# Patient Record
Sex: Female | Born: 1960
Health system: Southern US, Community
[De-identification: ages and names within clinical notes are randomized; demographics above are authoritative.]

## PROBLEM LIST (undated history)

## (undated) DIAGNOSIS — R7309 Other abnormal glucose: Secondary | ICD-10-CM

## (undated) DIAGNOSIS — E669 Obesity, unspecified: Secondary | ICD-10-CM

## (undated) DIAGNOSIS — Z86010 Personal history of colonic polyps: Secondary | ICD-10-CM

## (undated) DIAGNOSIS — R232 Flushing: Secondary | ICD-10-CM

## (undated) DIAGNOSIS — G709 Myoneural disorder, unspecified: Secondary | ICD-10-CM

## (undated) DIAGNOSIS — Z923 Personal history of irradiation: Secondary | ICD-10-CM

## (undated) DIAGNOSIS — C50919 Malignant neoplasm of unspecified site of unspecified female breast: Secondary | ICD-10-CM

## (undated) DIAGNOSIS — F419 Anxiety disorder, unspecified: Secondary | ICD-10-CM

## (undated) HISTORY — DX: Flushing: R23.2

## (undated) HISTORY — DX: Anxiety disorder, unspecified: F41.9

## (undated) HISTORY — DX: Personal history of colonic polyps: Z86.010

## (undated) HISTORY — DX: Malignant neoplasm of unspecified site of unspecified female breast: C50.919

## (undated) HISTORY — DX: Obesity, unspecified: E66.9

## (undated) HISTORY — DX: Other abnormal glucose: R73.09

## (undated) HISTORY — PX: BREAST SURGERY: SHX581

---

## 2003-05-08 ENCOUNTER — Other Ambulatory Visit: Admission: RE | Admit: 2003-05-08 | Discharge: 2003-05-08 | Payer: Self-pay | Admitting: *Deleted

## 2003-12-12 ENCOUNTER — Inpatient Hospital Stay (HOSPITAL_COMMUNITY): Admission: AD | Admit: 2003-12-12 | Discharge: 2003-12-16 | Payer: Self-pay | Admitting: Obstetrics & Gynecology

## 2004-01-16 ENCOUNTER — Other Ambulatory Visit: Admission: RE | Admit: 2004-01-16 | Discharge: 2004-01-16 | Payer: Self-pay | Admitting: Obstetrics & Gynecology

## 2004-04-29 ENCOUNTER — Ambulatory Visit (HOSPITAL_COMMUNITY): Admission: AD | Admit: 2004-04-29 | Discharge: 2004-04-29 | Payer: Self-pay | Admitting: Obstetrics and Gynecology

## 2012-01-27 DIAGNOSIS — C50919 Malignant neoplasm of unspecified site of unspecified female breast: Secondary | ICD-10-CM

## 2012-01-27 HISTORY — DX: Malignant neoplasm of unspecified site of unspecified female breast: C50.919

## 2012-05-02 ENCOUNTER — Telehealth: Payer: Self-pay | Admitting: *Deleted

## 2012-05-02 ENCOUNTER — Encounter: Payer: Self-pay | Admitting: Obstetrics and Gynecology

## 2012-05-02 ENCOUNTER — Ambulatory Visit (INDEPENDENT_AMBULATORY_CARE_PROVIDER_SITE_OTHER): Payer: BC Managed Care – PPO | Admitting: Obstetrics and Gynecology

## 2012-05-02 ENCOUNTER — Telehealth: Payer: Self-pay | Admitting: Obstetrics and Gynecology

## 2012-05-02 VITALS — BP 124/84 | Ht 63.0 in | Wt 218.0 lb

## 2012-05-02 DIAGNOSIS — E669 Obesity, unspecified: Secondary | ICD-10-CM | POA: Insufficient documentation

## 2012-05-02 DIAGNOSIS — Z01419 Encounter for gynecological examination (general) (routine) without abnormal findings: Secondary | ICD-10-CM

## 2012-05-02 DIAGNOSIS — Z0000 Encounter for general adult medical examination without abnormal findings: Secondary | ICD-10-CM

## 2012-05-02 DIAGNOSIS — N63 Unspecified lump in unspecified breast: Secondary | ICD-10-CM

## 2012-05-02 DIAGNOSIS — Z23 Encounter for immunization: Secondary | ICD-10-CM

## 2012-05-02 DIAGNOSIS — N6310 Unspecified lump in the right breast, unspecified quadrant: Secondary | ICD-10-CM

## 2012-05-02 LAB — POCT URINALYSIS DIPSTICK
Bilirubin, UA: NEGATIVE
Blood, UA: NEGATIVE
Glucose, UA: NEGATIVE
Ketones, UA: NEGATIVE

## 2012-05-02 MED ORDER — ALPRAZOLAM 0.25 MG PO TABS: 0.2500 mg | ORAL_TABLET | Freq: Three times a day (TID) | ORAL | Status: DC | PRN

## 2012-05-02 NOTE — Patient Instructions (Signed)

## 2012-05-02 NOTE — Telephone Encounter (Signed)
LEFT MESSAGE ON HOME # TO CALL OFFICE BACK FOR APPOINTMENT DATE AND TIME OF CONSULT APPT. WITH  DR. Arty Baumgartner.. APPT. MAY 6 TH @ 3:45PM..SUE
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
## 2012-05-02 NOTE — Progress Notes (Signed)
Pt. Tolerated injection well. 

## 2012-05-02 NOTE — Telephone Encounter (Signed)
Pt. Notified rx was escribed to [facility] [address].

## 2012-05-02 NOTE — Telephone Encounter (Signed)
Pt calling re: rx's to: [facility] [address] [phone]. Saw Dr. Edward Jolly today.

## 2012-05-03 ENCOUNTER — Ambulatory Visit
Admission: RE | Admit: 2012-05-03 | Discharge: 2012-05-03 | Disposition: A | Discharge status: Home / Self Care | Payer: BC Managed Care – PPO | Source: Ambulatory Visit | Attending: Obstetrics and Gynecology | Admitting: Obstetrics and Gynecology

## 2012-05-03 ENCOUNTER — Telehealth: Payer: Self-pay | Admitting: Obstetrics and Gynecology

## 2012-05-03 ENCOUNTER — Other Ambulatory Visit: Payer: Self-pay | Admitting: Obstetrics and Gynecology

## 2012-05-03 DIAGNOSIS — N6310 Unspecified lump in the right breast, unspecified quadrant: Secondary | ICD-10-CM

## 2012-05-03 LAB — CBC
Hemoglobin: 13.6 g/dL (ref 12.0–15.0)
MCH: 30.2 pg (ref 26.0–34.0)
MCV: 91.1 fL (ref 78.0–100.0)
RBC: 4.5 MIL/uL (ref 3.87–5.11)
WBC: 8.8 10*3/uL (ref 4.0–10.5)

## 2012-05-03 LAB — HEMOGLOBIN A1C
Hgb A1c MFr Bld: 5.9 % — ABNORMAL HIGH (ref <5.7)
Mean Plasma Glucose: 123 mg/dL — ABNORMAL HIGH (ref <117)

## 2012-05-03 LAB — LIPID PANEL
Cholesterol: 208 mg/dL — ABNORMAL HIGH (ref 0–200)
HDL: 51 mg/dL (ref >39)
Total CHOL/HDL Ratio: 4.1 Ratio
Triglycerides: 213 mg/dL — ABNORMAL HIGH (ref <150)
VLDL: 43 mg/dL — ABNORMAL HIGH (ref 0–40)

## 2012-05-03 LAB — COMPREHENSIVE METABOLIC PANEL
CO2: 23 mEq/L (ref 19–32)
Calcium: 9.6 mg/dL (ref 8.4–10.5)
Chloride: 105 mEq/L (ref 96–112)
Creat: 0.76 mg/dL (ref 0.50–1.10)
Glucose, Bld: 90 mg/dL (ref 70–99)
Total Bilirubin: 0.3 mg/dL (ref 0.3–1.2)
Total Protein: 6.5 g/dL (ref 6.0–8.3)

## 2012-05-03 NOTE — Telephone Encounter (Signed)
I left a message on answering machine that I called.  (I called to discuss results of mammogram, breast ultrasound, and blood work.  Patient will be undergoing a breast biopsy in 6 days.)  Patient will benefit from signing up with My Chart to facilitate communication.

## 2012-05-04 ENCOUNTER — Other Ambulatory Visit: Payer: BC Managed Care – PPO

## 2012-05-05 ENCOUNTER — Telehealth: Payer: Self-pay | Admitting: *Deleted

## 2012-05-05 NOTE — Telephone Encounter (Signed)
INFORMATION ENTERED IN ERROR INTO THIS PATIENT CHART.  DOCUMENTED INTO INCORRECT CHART.

## 2012-05-05 NOTE — Telephone Encounter (Signed)
Mammogram took off HOLD per Dr. Edward Jolly.

## 2012-05-06 ENCOUNTER — Telehealth: Payer: Self-pay | Admitting: *Deleted

## 2012-05-06 ENCOUNTER — Other Ambulatory Visit: Payer: Self-pay | Admitting: Obstetrics and Gynecology

## 2012-05-06 DIAGNOSIS — N6310 Unspecified lump in the right breast, unspecified quadrant: Secondary | ICD-10-CM

## 2012-05-06 LAB — HEMOGLOBIN, FINGERSTICK: Hemoglobin, fingerstick: 14 g/dL (ref 12.0–16.0)

## 2012-05-06 NOTE — Telephone Encounter (Signed)
DR. Edward Jolly TALKED WITH KASEY AT [facility] . NO ORDERS ARE COMING IN FOR EPIC  FOR DR. Edward Jolly TO [facility] FOR BREAST BIOPSY THAT IS TO BE DONE ON Monday. KASEY TO FAX ORDER TO OFFICE. SUE

## 2012-05-06 NOTE — Telephone Encounter (Signed)
Patient notified of date and time of appt. With Dr. Loreta Ave. May 6ht @3 :45pm.

## 2012-05-06 NOTE — Telephone Encounter (Signed)
I placed an order for ultrasound guided core biopsy.

## 2012-05-06 NOTE — Telephone Encounter (Signed)
Dr Edward Jolly please sign order for breast biopsy on patient in EPIC from [facility]. Patient is scheduled for procedure next week per Baird Lyons at the [facility]. States they did not receive fax from [place] to do this but says you can sign through [facility] for this. sue

## 2012-05-09 ENCOUNTER — Ambulatory Visit
Admission: RE | Admit: 2012-05-09 | Discharge: 2012-05-09 | Disposition: A | Discharge status: Home / Self Care | Payer: BC Managed Care – PPO | Source: Ambulatory Visit | Attending: Obstetrics and Gynecology | Admitting: Obstetrics and Gynecology

## 2012-05-09 ENCOUNTER — Other Ambulatory Visit: Payer: Self-pay | Admitting: Obstetrics and Gynecology

## 2012-05-09 DIAGNOSIS — N6310 Unspecified lump in the right breast, unspecified quadrant: Secondary | ICD-10-CM

## 2012-05-10 ENCOUNTER — Ambulatory Visit
Admission: RE | Admit: 2012-05-10 | Discharge: 2012-05-10 | Disposition: A | Discharge status: Home / Self Care | Payer: BC Managed Care – PPO | Source: Ambulatory Visit | Attending: Obstetrics and Gynecology | Admitting: Obstetrics and Gynecology

## 2012-05-10 ENCOUNTER — Other Ambulatory Visit: Payer: Self-pay | Admitting: Obstetrics and Gynecology

## 2012-05-10 DIAGNOSIS — C50911 Malignant neoplasm of unspecified site of right female breast: Secondary | ICD-10-CM

## 2012-05-10 DIAGNOSIS — N6310 Unspecified lump in the right breast, unspecified quadrant: Secondary | ICD-10-CM

## 2012-05-12 ENCOUNTER — Telehealth: Payer: Self-pay | Admitting: *Deleted

## 2012-05-12 ENCOUNTER — Telehealth: Payer: Self-pay | Admitting: Obstetrics and Gynecology

## 2012-05-12 DIAGNOSIS — C50111 Malignant neoplasm of central portion of right female breast: Secondary | ICD-10-CM

## 2012-05-12 NOTE — Telephone Encounter (Signed)
Confirmed [facility] for 05/18/12 at 1200.  Instructions and contact information given.

## 2012-05-12 NOTE — Telephone Encounter (Signed)
Phone call to patient in support of her in her new diagnosis of right breast cancer.  Patient is having an MRI on Friday.  She is beginning with appointments at the [facility].    States she has the support of family, prior colleagues from school, and new acquaintances.  I offered assistance of our office as well.

## 2012-05-13 ENCOUNTER — Ambulatory Visit
Admission: RE | Admit: 2012-05-13 | Discharge: 2012-05-13 | Disposition: A | Discharge status: Home / Self Care | Payer: BC Managed Care – PPO | Source: Ambulatory Visit | Attending: Obstetrics and Gynecology | Admitting: Obstetrics and Gynecology

## 2012-05-13 DIAGNOSIS — C50911 Malignant neoplasm of unspecified site of right female breast: Secondary | ICD-10-CM

## 2012-05-13 MED ORDER — GADOBENATE DIMEGLUMINE 529 MG/ML IV SOLN
19.0000 mL | Freq: Once | INTRAVENOUS | Status: AC | PRN
  Administered 2012-05-13: 19 mL via INTRAVENOUS

## 2012-05-16 ENCOUNTER — Other Ambulatory Visit: Payer: Self-pay | Admitting: Obstetrics and Gynecology

## 2012-05-18 ENCOUNTER — Ambulatory Visit: Payer: BC Managed Care – PPO | Attending: General Surgery | Admitting: Physical Therapy

## 2012-05-18 ENCOUNTER — Other Ambulatory Visit (HOSPITAL_BASED_OUTPATIENT_CLINIC_OR_DEPARTMENT_OTHER): Payer: BC Managed Care – PPO | Admitting: Lab

## 2012-05-18 ENCOUNTER — Encounter: Payer: Self-pay | Admitting: *Deleted

## 2012-05-18 ENCOUNTER — Ambulatory Visit (HOSPITAL_BASED_OUTPATIENT_CLINIC_OR_DEPARTMENT_OTHER): Payer: BC Managed Care – PPO | Admitting: Oncology

## 2012-05-18 ENCOUNTER — Other Ambulatory Visit (INDEPENDENT_AMBULATORY_CARE_PROVIDER_SITE_OTHER): Payer: Self-pay | Admitting: General Surgery

## 2012-05-18 ENCOUNTER — Ambulatory Visit
Admission: RE | Admit: 2012-05-18 | Discharge: 2012-05-18 | Disposition: A | Discharge status: Home / Self Care | Payer: BC Managed Care – PPO | Source: Ambulatory Visit | Attending: Radiation Oncology | Admitting: Radiation Oncology

## 2012-05-18 ENCOUNTER — Other Ambulatory Visit: Payer: Self-pay | Admitting: Oncology

## 2012-05-18 ENCOUNTER — Encounter: Payer: Self-pay | Admitting: Oncology

## 2012-05-18 ENCOUNTER — Ambulatory Visit: Payer: BC Managed Care – PPO

## 2012-05-18 ENCOUNTER — Ambulatory Visit (HOSPITAL_BASED_OUTPATIENT_CLINIC_OR_DEPARTMENT_OTHER): Payer: BC Managed Care – PPO | Admitting: General Surgery

## 2012-05-18 ENCOUNTER — Encounter (INDEPENDENT_AMBULATORY_CARE_PROVIDER_SITE_OTHER): Payer: Self-pay | Admitting: General Surgery

## 2012-05-18 VITALS — BP 123/73 | HR 69 | Temp 98.4°F | Resp 20 | Ht 63.0 in | Wt 222.0 lb

## 2012-05-18 DIAGNOSIS — C50419 Malignant neoplasm of upper-outer quadrant of unspecified female breast: Secondary | ICD-10-CM

## 2012-05-18 DIAGNOSIS — R293 Abnormal posture: Secondary | ICD-10-CM | POA: Insufficient documentation

## 2012-05-18 DIAGNOSIS — C50411 Malignant neoplasm of upper-outer quadrant of right female breast: Secondary | ICD-10-CM

## 2012-05-18 DIAGNOSIS — C50119 Malignant neoplasm of central portion of unspecified female breast: Secondary | ICD-10-CM

## 2012-05-18 DIAGNOSIS — C50111 Malignant neoplasm of central portion of right female breast: Secondary | ICD-10-CM

## 2012-05-18 DIAGNOSIS — C50919 Malignant neoplasm of unspecified site of unspecified female breast: Secondary | ICD-10-CM | POA: Insufficient documentation

## 2012-05-18 DIAGNOSIS — IMO0001 Reserved for inherently not codable concepts without codable children: Secondary | ICD-10-CM | POA: Insufficient documentation

## 2012-05-18 LAB — CBC WITH DIFFERENTIAL/PLATELET
BASO%: 0.9 % (ref 0.0–2.0)
LYMPH%: 28.5 % (ref 14.0–49.7)
MCHC: 33.8 g/dL (ref 31.5–36.0)
MONO#: 0.6 10*3/uL (ref 0.1–0.9)
Platelets: 262 10*3/uL (ref 145–400)
RBC: 4.08 10*6/uL (ref 3.70–5.45)
RDW: 13.4 % (ref 11.2–14.5)
WBC: 9.8 10*3/uL (ref 3.9–10.3)
lymph#: 2.8 10*3/uL (ref 0.9–3.3)

## 2012-05-18 LAB — COMPREHENSIVE METABOLIC PANEL (CC13)
ALT: 27 U/L (ref 0–55)
Albumin: 3.3 g/dL — ABNORMAL LOW (ref 3.5–5.0)
CO2: 21 mEq/L — ABNORMAL LOW (ref 22–29)
Calcium: 9 mg/dL (ref 8.4–10.4)
Chloride: 109 mEq/L — ABNORMAL HIGH (ref 98–107)
Glucose: 130 mg/dl — ABNORMAL HIGH (ref 70–99)
Potassium: 4.2 mEq/L (ref 3.5–5.1)
Sodium: 139 mEq/L (ref 136–145)
Total Protein: 6.9 g/dL (ref 6.4–8.3)
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
## 2012-05-18 NOTE — Progress Notes (Signed)
Checked in new patient. No financial issues. She did have her [facility] Form. °

## 2012-05-19 ENCOUNTER — Telehealth: Payer: Self-pay | Admitting: *Deleted

## 2012-05-19 ENCOUNTER — Other Ambulatory Visit: Payer: BC Managed Care – PPO | Admitting: Lab

## 2012-05-19 ENCOUNTER — Encounter: Payer: Self-pay | Admitting: Genetic Counselor

## 2012-05-19 ENCOUNTER — Ambulatory Visit (HOSPITAL_BASED_OUTPATIENT_CLINIC_OR_DEPARTMENT_OTHER): Payer: BC Managed Care – PPO | Admitting: Genetic Counselor

## 2012-05-19 ENCOUNTER — Encounter (HOSPITAL_COMMUNITY): Payer: Self-pay

## 2012-05-19 ENCOUNTER — Other Ambulatory Visit: Payer: Self-pay | Admitting: *Deleted

## 2012-05-19 DIAGNOSIS — C50411 Malignant neoplasm of upper-outer quadrant of right female breast: Secondary | ICD-10-CM

## 2012-05-19 DIAGNOSIS — C50419 Malignant neoplasm of upper-outer quadrant of unspecified female breast: Secondary | ICD-10-CM

## 2012-05-19 DIAGNOSIS — Z803 Family history of malignant neoplasm of breast: Secondary | ICD-10-CM

## 2012-05-19 DIAGNOSIS — IMO0002 Reserved for concepts with insufficient information to code with codable children: Secondary | ICD-10-CM

## 2012-05-19 MED ORDER — ALPRAZOLAM 0.25 MG PO TABS: 0.2500 mg | ORAL_TABLET | Freq: Three times a day (TID) | ORAL | Status: DC | PRN

## 2012-05-19 NOTE — Progress Notes (Signed)
Dr's.  Drue Second and Emelia Loron requested a consultation for genetic counseling and risk assessment for [name], a 52 y.o. female, for discussion of her personal history of breast cancer. She presents to clinic today to discuss the possibility of a genetic predisposition to cancer, and to further clarify her risks, as well as her family members' risks for cancer.   HISTORY OF PRESENT ILLNESS: In 2014, at the age of 39, Cheryl Moore was diagnosed with invasive ductal carcinoma of the left breast.  The tumor is ER+, PR+, Her2-.  This will be treated with Chemotherapy, surgery and radiation.  The patient does not have any other history of cancer.  However, when asked if she had been exposed to something that may have caused her cancer, she reports that her father was a Charity fundraiser and they lived in a [place] town.  He was a non-smoker and died of lung cancer, and her sister (also adopted) had breast cancer at age 49, although she subsequently was found to be BRCA+.  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    Past Medical History  Diagnosis Date  . Breast cancer   . Anxiety   . Hot flashes     Past Surgical History  Procedure Laterality Date  . Cesarean section  2005    History  Substance Use Topics  . Smoking status: Former Games developer  . Smokeless tobacco: Not on file  . Alcohol Use: Yes     Comment: 1-2 per month    REPRODUCTIVE HISTORY AND PERSONAL RISK ASSESSMENT FACTORS: Menarche was at age 41.   Premenopausal Uterus Intact: Yes Ovaries Intact: Yes G1P1A0 , first live birth at age 73  She has not previously undergone treatment for infertility.   Never used OCPs.   She has not used HRT in the past.   Colonoscopy:  First colonoscopy is scheduled for in a few weeks.  FAMILY HISTORY:  We obtained a detailed, 4-generation family history.  Significant diagnoses are listed  below: Family History  Problem Relation Age of Onset  . Adopted: Yes    Patient's maternal ancestors are of unknown descent, and paternal ancestors are of unknown descent. There is possible reported Ashkenazi Jewish ancestry, based on her last name of [name] at the time of birth. There is no known consanguinity.  GENETIC COUNSELING RISK ASSESSMENT, DISCUSSION, AND SUGGESTED FOLLOW UP: We reviewed the natural history and genetic etiology of sporadic, familial and hereditary cancer syndromes.  About 5-10% of breast cancer is hereditary.  Of this, about 85% is the result of a BRCA1 or BRCA2 mutation.  We reviewed the red flags of hereditary cancer syndromes and the dominant inheritance patterns. If the patient is not Jewish, she does not meet medical criteria for testing.  We discussed that if she has [place] that there would be an increased likelihood of her testing positive for a BRCA mutation.  We reviewed the common mutations within the Jewish population. We will order the testing indicating possible Jewish ancestry to see if it will be covered.  If the BRCA testing is negative, we discussed that we could be testing for the wrong gene.  We discussed gene panels, and that several cancer genes that are associated with different cancers can be tested at the same time.  Because the patient is adopted, we will order the larger panel as we do not know what  is in the family history.   The patient's personal history of breast cancer and possible Jewish ancestry is suggestive of the following possible diagnosis: hereditary breast and ovarian cancer (HBOC)   We discussed that identification of a hereditary cancer syndrome may help her care providers tailor the patients medical management. If a mutation indicating HBOC is detected in this case, the [employer] recommendations would include increased cancer surveillance and possible prophylactic surgery. If a mutation is  detected, the patient will be referred back to the referring provider and to any additional appropriate care providers to discuss the relevant options.   If a mutation is not found in the patient, this will decrease the likelihood of HBOC as the explanation for her breast cancer. Cancer surveillance options would be discussed for the patient according to the appropriate standard National Comprehensive Cancer Network and American Cancer Society guidelines, with consideration of their personal and family history risk factors. In this case, the patient will be referred back to their care providers for discussions of management.   In order to estimate her chance of having a BRCA mutation, we used statistical models (Penn II) and laboratory data that take into account her personal medical history, family history and ancestry.  Because each model is different, there can be a lot of variability in the risks they give.  Therefore, these numbers must be considered a rough range and not a precise risk of having a BRCA mutation.  These models estimate that she has approximately a 6-14% chance of having a mutation. Based on this assessment of her family and personal history, genetic testing is recommended.  After considering the risks, benefits, and limitations, the patient provided informed consent for  the following  testing: multisite, reflexing to BRCA1/2 and del/dup analysis, reflexing to Breast/ovarian cancer panel through [facility].   Per the patient's request, we will contact her by telephone to discuss these results. A follow up genetic counseling visit will be scheduled if indicated.  The patient was seen for a total of 60 minutes, greater than 50% of which was spent face-to-face counseling.  This plan is being carried out per Dr. Drue Second and Jannet Askew recommendations.  This note will also be sent to the referring provider via the electronic medical record. The patient will be supplied with a  summary of this genetic counseling discussion as well as educational information on the discussed hereditary cancer syndromes following the conclusion of their visit.   Patient was discussed with Dr. Drue Second.    _______________________________________________________________________ For Office Staff:  Number of people involved in session: 1 Was an Intern/ student involved with case: no }

## 2012-05-19 NOTE — Telephone Encounter (Signed)
Pt called in to relay she didn't received ativan script from Dr. Welton Flakes and that she was waiting to be scheduled for staging scans and chemo class.  Informed pt that I would call meds and work on getting her scheduled for her scans.  Pt denies questions or concerns at this time regarding dx or treatment care plan.  Pt relayed she was very pleased with her appt and how she was treated.  Encourage pt to call with needs.  Contact information given.

## 2012-05-20 ENCOUNTER — Telehealth: Payer: Self-pay | Admitting: Oncology

## 2012-05-20 ENCOUNTER — Encounter (HOSPITAL_COMMUNITY): Payer: Self-pay

## 2012-05-20 ENCOUNTER — Encounter (HOSPITAL_COMMUNITY)
Admission: RE | Admit: 2012-05-20 | Discharge: 2012-05-20 | Disposition: A | Discharge status: Home / Self Care | Payer: BC Managed Care – PPO | Source: Ambulatory Visit | Attending: General Surgery | Admitting: General Surgery

## 2012-05-20 LAB — BASIC METABOLIC PANEL
CO2: 24 mEq/L (ref 19–32)
Chloride: 103 mEq/L (ref 96–112)
Glucose, Bld: 161 mg/dL — ABNORMAL HIGH (ref 70–99)
Potassium: 4 mEq/L (ref 3.5–5.1)
Sodium: 138 mEq/L (ref 135–145)

## 2012-05-20 LAB — SURGICAL PCR SCREEN
MRSA, PCR: NEGATIVE
Staphylococcus aureus: NEGATIVE

## 2012-05-20 LAB — CBC
Hemoglobin: 13.3 g/dL (ref 12.0–15.0)
MCH: 31.4 pg (ref 26.0–34.0)
RBC: 4.23 MIL/uL (ref 3.87–5.11)

## 2012-05-20 NOTE — Pre-Procedure Instructions (Addendum)
Cheryl Moore  05/20/2012   Your procedure is scheduled on:  Mon, April 28 @ 7:25 AM  Report to [facility] at 5:30 AM.  Call this number if you have problems the morning of surgery: [phone]   Remember:   Do not eat food or drink liquids after midnight.   Take these medicines the morning of surgery with A SIP OF WATER: Xanax(Alprazolam)              Stop taking your Ibuprofen.No Goody's,BC's,Aleve,Fish Oil,or any Herbal Medications   Do not wear jewelry, make-up or nail polish.  Do not wear lotions, powders, or perfumes. You may wear deodorant.  Do not shave 48 hours prior to surgery.   Do not bring valuables to the hospital.  Contacts, dentures or bridgework may not be worn into surgery.  Leave suitcase in the car. After surgery it may be brought to your room.  For patients admitted to the hospital, checkout time is 11:00 AM the day of  discharge.   Patients discharged the day of surgery will not be allowed to drive  home.    Special Instructions: Shower using CHG 2 nights before surgery and the night before surgery.  If you shower the day of surgery use CHG.  Use special wash - you have one bottle of CHG for all showers.  You should use approximately 1/3 of the bottle for each shower.   Please read over the following fact sheets that you were given: Pain Booklet, Coughing and Deep Breathing, MRSA Information and Surgical Site Infection Prevention

## 2012-05-22 MED ORDER — VANCOMYCIN HCL 10 G IV SOLR
1500.0000 mg | INTRAVENOUS | Status: AC
  Administered 2012-05-23: 1500 mg via INTRAVENOUS
  Filled 2012-05-22: qty 1500

## 2012-05-23 ENCOUNTER — Encounter (HOSPITAL_COMMUNITY): Payer: Self-pay | Admitting: Anesthesiology

## 2012-05-23 ENCOUNTER — Ambulatory Visit (HOSPITAL_COMMUNITY): Payer: BC Managed Care – PPO | Admitting: Anesthesiology

## 2012-05-23 ENCOUNTER — Ambulatory Visit (HOSPITAL_COMMUNITY)
Admission: RE | Admit: 2012-05-23 | Discharge: 2012-05-23 | Disposition: A | Discharge status: Home / Self Care | Payer: BC Managed Care – PPO | Source: Ambulatory Visit | Attending: General Surgery | Admitting: General Surgery

## 2012-05-23 ENCOUNTER — Encounter (HOSPITAL_COMMUNITY)
Admission: RE | Disposition: A | Discharge status: Home / Self Care | Payer: Self-pay | Source: Ambulatory Visit | Attending: General Surgery

## 2012-05-23 ENCOUNTER — Ambulatory Visit (HOSPITAL_COMMUNITY): Payer: BC Managed Care – PPO

## 2012-05-23 ENCOUNTER — Encounter (HOSPITAL_COMMUNITY): Payer: Self-pay | Admitting: Surgery

## 2012-05-23 ENCOUNTER — Encounter: Payer: Self-pay | Admitting: *Deleted

## 2012-05-23 DIAGNOSIS — C773 Secondary and unspecified malignant neoplasm of axilla and upper limb lymph nodes: Secondary | ICD-10-CM | POA: Insufficient documentation

## 2012-05-23 DIAGNOSIS — C50919 Malignant neoplasm of unspecified site of unspecified female breast: Secondary | ICD-10-CM | POA: Insufficient documentation

## 2012-05-23 DIAGNOSIS — Z79899 Other long term (current) drug therapy: Secondary | ICD-10-CM | POA: Insufficient documentation

## 2012-05-23 DIAGNOSIS — Z170 Estrogen receptor positive status [ER+]: Secondary | ICD-10-CM | POA: Insufficient documentation

## 2012-05-23 DIAGNOSIS — Z880 Allergy status to penicillin: Secondary | ICD-10-CM | POA: Insufficient documentation

## 2012-05-23 DIAGNOSIS — C792 Secondary malignant neoplasm of skin: Secondary | ICD-10-CM | POA: Insufficient documentation

## 2012-05-23 DIAGNOSIS — Z87891 Personal history of nicotine dependence: Secondary | ICD-10-CM | POA: Insufficient documentation

## 2012-05-23 HISTORY — PX: PORTACATH PLACEMENT: SHX2246

## 2012-05-23 HISTORY — PX: BREAST BIOPSY: SHX20

## 2012-05-23 SURGERY — BREAST BIOPSY
Anesthesia: General | Site: Chest | Laterality: Right | Wound class: Clean

## 2012-05-23 MED ORDER — LIDOCAINE HCL (CARDIAC) 20 MG/ML IV SOLN
INTRAVENOUS | Status: DC | PRN
  Administered 2012-05-23: 40 mg via INTRAVENOUS

## 2012-05-23 MED ORDER — HEPARIN SOD (PORK) LOCK FLUSH 100 UNIT/ML IV SOLN
INTRAVENOUS | Status: DC | PRN
  Administered 2012-05-23: 500 [IU] via INTRAVENOUS

## 2012-05-23 MED ORDER — SODIUM CHLORIDE 0.9 % IR SOLN
Status: DC | PRN
  Administered 2012-05-23: 08:00:00

## 2012-05-23 MED ORDER — FENTANYL CITRATE 0.05 MG/ML IJ SOLN
INTRAMUSCULAR | Status: DC | PRN
  Administered 2012-05-23: 25 ug via INTRAVENOUS
  Administered 2012-05-23: 100 ug via INTRAVENOUS

## 2012-05-23 MED ORDER — PROPOFOL 10 MG/ML IV BOLUS
INTRAVENOUS | Status: DC | PRN
  Administered 2012-05-23: 200 mg via INTRAVENOUS

## 2012-05-23 MED ORDER — HEPARIN SOD (PORK) LOCK FLUSH 100 UNIT/ML IV SOLN
INTRAVENOUS | Status: AC
  Filled 2012-05-23: qty 5

## 2012-05-23 MED ORDER — MIDAZOLAM HCL 5 MG/5ML IJ SOLN
INTRAMUSCULAR | Status: DC | PRN
  Administered 2012-05-23: 2 mg via INTRAVENOUS

## 2012-05-23 MED ORDER — BUPIVACAINE HCL (PF) 0.25 % IJ SOLN
INTRAMUSCULAR | Status: AC
  Filled 2012-05-23: qty 30

## 2012-05-23 MED ORDER — LACTATED RINGERS IV SOLN
INTRAVENOUS | Status: DC | PRN
  Administered 2012-05-23: 07:00:00 via INTRAVENOUS

## 2012-05-23 MED ORDER — ONDANSETRON HCL 4 MG/2ML IJ SOLN
INTRAMUSCULAR | Status: DC | PRN
  Administered 2012-05-23: 4 mg via INTRAVENOUS

## 2012-05-23 MED ORDER — HYDROMORPHONE HCL PF 1 MG/ML IJ SOLN
0.2500 mg | INTRAMUSCULAR | Status: DC | PRN
  Administered 2012-05-23: 0.5 mg via INTRAVENOUS

## 2012-05-23 MED ORDER — OXYCODONE-ACETAMINOPHEN 5-325 MG PO TABS: 1.0000 | ORAL_TABLET | ORAL | Status: DC | PRN

## 2012-05-23 MED ORDER — 0.9 % SODIUM CHLORIDE (POUR BTL) OPTIME
TOPICAL | Status: DC | PRN
  Administered 2012-05-23: 1000 mL

## 2012-05-23 MED ORDER — HYDROMORPHONE HCL PF 1 MG/ML IJ SOLN
INTRAMUSCULAR | Status: AC
  Filled 2012-05-23: qty 1

## 2012-05-23 MED ORDER — ONDANSETRON HCL 4 MG/2ML IJ SOLN: 4.0000 mg | Freq: Once | INTRAMUSCULAR | Status: DC | PRN

## 2012-05-23 MED ORDER — BUPIVACAINE-EPINEPHRINE 0.25% -1:200000 IJ SOLN
INTRAMUSCULAR | Status: DC | PRN
  Administered 2012-05-23: 10 mL

## 2012-05-23 MED ORDER — BUPIVACAINE-EPINEPHRINE PF 0.25-1:200000 % IJ SOLN
INTRAMUSCULAR | Status: AC
  Filled 2012-05-23: qty 30

## 2012-05-23 MED ORDER — BACITRACIN ZINC 500 UNIT/GM EX OINT
TOPICAL_OINTMENT | CUTANEOUS | Status: DC | PRN
  Administered 2012-05-23: 1 via TOPICAL

## 2012-05-23 MED ORDER — DOUBLE ANTIBIOTIC 500-10000 UNIT/GM EX OINT
TOPICAL_OINTMENT | CUTANEOUS | Status: AC
  Filled 2012-05-23: qty 1

## 2012-05-23 SURGICAL SUPPLY — 77 items
ADH SKN CLS APL DERMABOND .7 (GAUZE/BANDAGES/DRESSINGS) ×2
APPLIER CLIP 9.375 MED OPEN (MISCELLANEOUS)
APR CLP MED 9.3 20 MLT OPN (MISCELLANEOUS)
BAG DECANTER FOR FLEXI CONT (MISCELLANEOUS) ×3 IMPLANT
BINDER BREAST LRG (GAUZE/BANDAGES/DRESSINGS) IMPLANT
BINDER BREAST XLRG (GAUZE/BANDAGES/DRESSINGS) IMPLANT
BLADE SURG 11 STRL SS (BLADE) ×3 IMPLANT
BLADE SURG 15 STRL LF DISP TIS (BLADE) ×2 IMPLANT
BLADE SURG 15 STRL SS (BLADE) ×3
BLADE SURG ROTATE 9660 (MISCELLANEOUS) IMPLANT
CANISTER SUCTION 2500CC (MISCELLANEOUS) IMPLANT
CHLORAPREP W/TINT 26ML (MISCELLANEOUS) ×3 IMPLANT
CLIP APPLIE 9.375 MED OPEN (MISCELLANEOUS) IMPLANT
CLOTH BEACON ORANGE TIMEOUT ST (SAFETY) ×3 IMPLANT
CONT SPEC STER OR (MISCELLANEOUS) ×2 IMPLANT
COVER SURGICAL LIGHT HANDLE (MISCELLANEOUS) ×3 IMPLANT
CRADLE DONUT ADULT HEAD (MISCELLANEOUS) ×3 IMPLANT
DECANTER SPIKE VIAL GLASS SM (MISCELLANEOUS) ×3 IMPLANT
DERMABOND ADVANCED (GAUZE/BANDAGES/DRESSINGS) ×1
DERMABOND ADVANCED .7 DNX12 (GAUZE/BANDAGES/DRESSINGS) ×2 IMPLANT
DRAPE C-ARM 42X72 X-RAY (DRAPES) ×3 IMPLANT
DRAPE CHEST BREAST 15X10 FENES (DRAPES) ×2 IMPLANT
DRAPE LAPAROSCOPIC ABDOMINAL (DRAPES) ×3 IMPLANT
DRSG OPSITE 4X5.5 SM (GAUZE/BANDAGES/DRESSINGS) ×1 IMPLANT
ELECT CAUTERY BLADE 6.4 (BLADE) ×3 IMPLANT
ELECT REM PT RETURN 9FT ADLT (ELECTROSURGICAL) ×3
ELECTRODE REM PT RTRN 9FT ADLT (ELECTROSURGICAL) ×2 IMPLANT
GAUZE SPONGE 2X2 8PLY STRL LF (GAUZE/BANDAGES/DRESSINGS) IMPLANT
GAUZE SPONGE 4X4 16PLY XRAY LF (GAUZE/BANDAGES/DRESSINGS) ×3 IMPLANT
GLOVE BIO SURGEON STRL SZ7 (GLOVE) ×4 IMPLANT
GLOVE BIO SURGEON STRL SZ7.5 (GLOVE) ×1 IMPLANT
GLOVE BIOGEL PI IND STRL 6.5 (GLOVE) IMPLANT
GLOVE BIOGEL PI IND STRL 7.5 (GLOVE) ×2 IMPLANT
GLOVE BIOGEL PI INDICATOR 6.5 (GLOVE) ×1
GLOVE BIOGEL PI INDICATOR 7.5 (GLOVE) ×2
GOWN STRL NON-REIN LRG LVL3 (GOWN DISPOSABLE) ×6 IMPLANT
INTRODUCER COOK 11FR (CATHETERS) IMPLANT
KIT BASIN OR (CUSTOM PROCEDURE TRAY) ×3 IMPLANT
KIT PORT POWER 8FR ISP CVUE (Catheter) ×1 IMPLANT
KIT PORT POWER 9.6FR MRI PREA (Catheter) IMPLANT
KIT PORT POWER ISP 8FR (Catheter) IMPLANT
KIT POWER CATH 8FR (Catheter) IMPLANT
KIT ROOM TURNOVER OR (KITS) ×3 IMPLANT
NDL HYPO 25GX1X1/2 BEV (NEEDLE) ×2 IMPLANT
NEEDLE HYPO 25GX1X1/2 BEV (NEEDLE) ×3 IMPLANT
NS IRRIG 1000ML POUR BTL (IV SOLUTION) ×3 IMPLANT
PACK SURGICAL SETUP 50X90 (CUSTOM PROCEDURE TRAY) ×3 IMPLANT
PAD ARMBOARD 7.5X6 YLW CONV (MISCELLANEOUS) ×6 IMPLANT
PENCIL BUTTON HOLSTER BLD 10FT (ELECTRODE) ×3 IMPLANT
PUNCH BIOPSY (OTIC EAR SUPPLIES) ×1 IMPLANT
SET INTRODUCER 12FR PACEMAKER (SHEATH) IMPLANT
SET SHEATH INTRODUCER 10FR (MISCELLANEOUS) IMPLANT
SHEATH COOK PEEL AWAY SET 9F (SHEATH) IMPLANT
SPONGE GAUZE 2X2 STER 10/PKG (GAUZE/BANDAGES/DRESSINGS) ×1
SPONGE GAUZE 4X4 12PLY (GAUZE/BANDAGES/DRESSINGS) ×1 IMPLANT
SPONGE LAP 4X18 X RAY DECT (DISPOSABLE) ×3 IMPLANT
STAPLER VISISTAT 35W (STAPLE) ×1 IMPLANT
SUT ETHILON 2 0 FS 18 (SUTURE) ×1 IMPLANT
SUT ETHILON 3 0 FSL (SUTURE) ×1 IMPLANT
SUT MNCRL AB 4-0 PS2 18 (SUTURE) ×3 IMPLANT
SUT PROLENE 2 0 SH 30 (SUTURE) ×3 IMPLANT
SUT SILK 2 0 (SUTURE)
SUT SILK 2 0 SH (SUTURE) IMPLANT
SUT SILK 2-0 18XBRD TIE 12 (SUTURE) IMPLANT
SUT VIC AB 2-0 SH 27 (SUTURE) ×3
SUT VIC AB 2-0 SH 27X BRD (SUTURE) ×2 IMPLANT
SUT VIC AB 3-0 SH 27 (SUTURE) ×3
SUT VIC AB 3-0 SH 27XBRD (SUTURE) ×2 IMPLANT
SYR 20ML ECCENTRIC (SYRINGE) ×6 IMPLANT
SYR 5ML LUER SLIP (SYRINGE) ×3 IMPLANT
SYR BULB 3OZ (MISCELLANEOUS) ×3 IMPLANT
SYR CONTROL 10ML LL (SYRINGE) ×3 IMPLANT
TOWEL OR 17X24 6PK STRL BLUE (TOWEL DISPOSABLE) ×3 IMPLANT
TOWEL OR 17X26 10 PK STRL BLUE (TOWEL DISPOSABLE) ×3 IMPLANT
TUBE CONNECTING 12X1/4 (SUCTIONS) IMPLANT
WATER STERILE IRR 1000ML POUR (IV SOLUTION) IMPLANT
YANKAUER SUCT BULB TIP NO VENT (SUCTIONS) IMPLANT

## 2012-05-23 NOTE — Transfer of Care (Signed)
Immediate Anesthesia Transfer of Care Note  Patient: Cheryl Moore  Procedure(s) Performed: Procedure(s): SKIN PUNCH BIOPSY RIGHT BREAST (Right) INSERTION PORT-A-CATH (Left)  Patient Location: PACU  Anesthesia Type:Regional  Level of Consciousness: awake, alert  and oriented  Airway & Oxygen Therapy: Patient Spontanous Breathing and Patient connected to face mask oxygen  Post-op Assessment: Report given to PACU RN and Post -op Vital signs reviewed and stable  Post vital signs: Reviewed and stable  Complications: No apparent anesthesia complications
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
## 2012-05-23 NOTE — Interval H&P Note (Signed)
History and Physical Interval Note:  05/23/2012 7:23 AM  Cheryl Moore  has presented today for surgery, with the diagnosis of right breast cancer  The various methods of treatment have been discussed with the patient and family. After consideration of risks, benefits and other options for treatment, the patient has consented to  Procedure(s): SKIN PUNCH BIOPSY RIGHT BREAST (Right) INSERTION PORT-A-CATH (N/A) as a surgical intervention .  The patient's history has been reviewed, patient examined, no change in status, stable for surgery.  I have reviewed the patient's chart and labs.  Questions were answered to the patient's satisfaction.     [name]

## 2012-05-23 NOTE — Op Note (Signed)
Preoperative diagnosis: Locally advanced right breast cancer Postoperative diagnosis: Same as above Procedure: #1 right breast skin punch biopsy #2 left subclavian power port insertion Surgeon: Dr. Dwain Sarna Anesthesia: Gen. With LMA Specimens: Right breast skin and underlying tissue to pathology Estimated blood loss: Minimal Complications: None Drains: None Sponge and needle count correct at end of operation Disposition to recovery stable  Indications: This is a 52 year old female who was seen last week with a locally advanced right breast cancer. She has node-positive disease upon presentation. She is undergoing evaluation for metastatic disease currently. I was also concerned she might have an inflammatory breast cancer with the associated skin thickening some mild erythema. She was seen in our multidisciplinary clinic. We decided to proceed with systemic therapy first. I discussed with her a punch biopsy of her skin on the right side as well as a port placement.  Procedure: After informed consent was obtained the patient was taken to the operating room. She was administered vancomycin due to a penicillin allergy. Sequential compression devices were on her legs. She was then placed under general anesthesia with an LMA. Her arms were tucked appropriately padded. Her right chest was then prepped and draped in the standard sterile surgical fashion, A surgical timeout was performed.  I performed a 6 mm punch biopsy of the skin on the right breast. This was passed off the table as a specimen. I closed this with 3-0 nylon sutures. Eventually a dressing was placed on this as well.  I then prepped and draped her again. A surgical timeout was then performed again.  I accessed her subclavian vein on the left side on the second pass. I then placed a wire and confirmed this by fluoroscopy. I then made a pocket below this.  I then tunneled the line between the 2 sides. I dilated up the tract. I then  inserted the dilator and the peel-away sheath. This was done with fluoroscopy. I removed the wire assembly. I then placed the line into the peel-away sheath and then this was removed. I pulled the line back to be in the distal cava. I then secured this to the port. I sutured the port in position with 2-0 Prolene in 2 places. Upon completion the port flushed easily and aspirated blood. I packed this with heparin. I then looked with fluoro one more time and this was all in good position without any kinks. I then closes with 3-0 Vicryl, 4-0 Monocryl, and Dermabond. She tolerated this well was transferred to recovery stable.

## 2012-05-23 NOTE — Progress Notes (Signed)
[facility] Psychosocial Distress Screening Clinical Social Work  Patient completed distress screening protocol, and scored a 9 on the Psychosocial Distress Thermometer which indicates severe distress. Clinical Social Worker met with pt in [facility] to assess for distress and other psychosocial needs.  Pt stated she is "generally an emotional person" and has experienced increased anxiety since her diagnosis.  CSw and pt discussed the importance of emotional support and acknowledged the emotional journey.  Pt stated she felt "better" after speaking with the physicians and getting more information on her treatment plan.  CSW informed pt of the support team and support services at [facility].  Pt expressed interest in support programs and was appreciative of the information.  CSW encouraged pt to call with any additional questions or concerns.    Tamala Julian, MSW, LCSW Clinical Social Worker [facility] [phone]

## 2012-05-23 NOTE — Anesthesia Preprocedure Evaluation (Signed)
Anesthesia Evaluation  Patient identified by MRN, date of birth, ID band Patient awake    Reviewed: Allergy & Precautions, H&P , NPO status , Patient's Chart, lab work & pertinent test results  Airway Mallampati: I TM Distance: >3 FB Neck ROM: full    Dental   Pulmonary          Cardiovascular Rhythm:regular Rate:Normal     Neuro/Psych Anxiety    GI/Hepatic   Endo/Other    Renal/GU      Musculoskeletal   Abdominal   Peds  Hematology   Anesthesia Other Findings R Breast CA  Reproductive/Obstetrics                           Anesthesia Physical Anesthesia Plan  ASA: I  Anesthesia Plan: General   Post-op Pain Management:    Induction: Intravenous  Airway Management Planned: LMA  Additional Equipment:   Intra-op Plan:   Post-operative Plan: Extubation in OR  Informed Consent: I have reviewed the patients History and Physical, chart, labs and discussed the procedure including the risks, benefits and alternatives for the proposed anesthesia with the patient or authorized representative who has indicated his/her understanding and acceptance.     Plan Discussed with: CRNA, Anesthesiologist and Surgeon  Anesthesia Plan Comments:         Anesthesia Quick Evaluation

## 2012-05-23 NOTE — Anesthesia Procedure Notes (Signed)
Procedure Name: LMA Insertion Date/Time: 05/23/2012 7:32 AM Performed by: Arlice Colt B Pre-anesthesia Checklist: Patient identified, Emergency Drugs available, Suction available, Patient being monitored and Timeout performed Patient Re-evaluated:Patient Re-evaluated prior to inductionOxygen Delivery Method: Circle system utilized Preoxygenation: Pre-oxygenation with 100% oxygen Intubation Type: IV induction LMA: LMA inserted LMA Size: 4.0 Number of attempts: 1 Placement Confirmation: positive ETCO2 and breath sounds checked- equal and bilateral Tube secured with: Tape Dental Injury: Teeth and Oropharynx as per pre-operative assessment

## 2012-05-23 NOTE — Anesthesia Postprocedure Evaluation (Signed)
  Anesthesia Post-op Note  Patient: Geneticist, molecular  Procedure(s) Performed: Procedure(s): SKIN PUNCH BIOPSY RIGHT BREAST (Right) INSERTION PORT-A-CATH (Left)  Patient Location: PACU  Anesthesia Type:General  Level of Consciousness: awake, alert , oriented and patient cooperative  Airway and Oxygen Therapy: Patient Spontanous Breathing  Post-op Pain: mild  Post-op Assessment: Post-op Vital signs reviewed, Patient's Cardiovascular Status Stable, Respiratory Function Stable, Patent Airway, No signs of Nausea or vomiting and Pain level controlled  Post-op Vital Signs: stable  Complications: No apparent anesthesia complications

## 2012-05-24 ENCOUNTER — Encounter (HOSPITAL_COMMUNITY): Payer: Self-pay | Admitting: General Surgery

## 2012-05-24 ENCOUNTER — Other Ambulatory Visit: Payer: Self-pay | Admitting: *Deleted

## 2012-05-24 ENCOUNTER — Encounter: Payer: Self-pay | Admitting: *Deleted

## 2012-05-24 ENCOUNTER — Telehealth (INDEPENDENT_AMBULATORY_CARE_PROVIDER_SITE_OTHER): Payer: Self-pay | Admitting: General Surgery

## 2012-05-24 ENCOUNTER — Telehealth: Payer: Self-pay | Admitting: *Deleted

## 2012-05-24 ENCOUNTER — Other Ambulatory Visit: Payer: BC Managed Care – PPO

## 2012-05-24 DIAGNOSIS — C50919 Malignant neoplasm of unspecified site of unspecified female breast: Secondary | ICD-10-CM

## 2012-05-24 NOTE — Telephone Encounter (Signed)
Left message for patient to call office to make po f/u visit

## 2012-05-24 NOTE — Progress Notes (Signed)
Patient at [facility] for chemotherapy class.  I met patient following class to f/u from [facility] on 05/18/12.  Patient says she is doing well.  She is anxious to get her appointments scheduled.  We went to Scheduling to schedule her ECHO and f/u visit with Dr. Welton Flakes.  Patient states that the many appointments are helping to keep her busy and mind occupied so that she doesn't dwell on her diagnosis.  She is most anxious to complete and get the results of the PET scan scheduled for 05/26/12 so that she will know the extent of her disease.  The site of her punch biopsy is without swelling or drainage and is slightly red.  Patient denied any questions or other concerns at this time.  She verified that she has my contact information and was encouraged to call for any needs.

## 2012-05-24 NOTE — Telephone Encounter (Signed)
sw pt gv appt d/t for 05/27/12. Pt is aware.Cheryl Moore

## 2012-05-25 ENCOUNTER — Ambulatory Visit (HOSPITAL_COMMUNITY)
Admission: RE | Admit: 2012-05-25 | Discharge: 2012-05-25 | Disposition: A | Discharge status: Home / Self Care | Payer: BC Managed Care – PPO | Source: Ambulatory Visit | Attending: Internal Medicine | Admitting: Internal Medicine

## 2012-05-25 ENCOUNTER — Telehealth: Payer: Self-pay | Admitting: *Deleted

## 2012-05-25 DIAGNOSIS — R059 Cough, unspecified: Secondary | ICD-10-CM | POA: Insufficient documentation

## 2012-05-25 DIAGNOSIS — J984 Other disorders of lung: Secondary | ICD-10-CM | POA: Insufficient documentation

## 2012-05-25 DIAGNOSIS — Z01818 Encounter for other preprocedural examination: Secondary | ICD-10-CM | POA: Insufficient documentation

## 2012-05-25 DIAGNOSIS — J90 Pleural effusion, not elsewhere classified: Secondary | ICD-10-CM | POA: Insufficient documentation

## 2012-05-25 DIAGNOSIS — C7951 Secondary malignant neoplasm of bone: Secondary | ICD-10-CM | POA: Insufficient documentation

## 2012-05-25 DIAGNOSIS — C50411 Malignant neoplasm of upper-outer quadrant of right female breast: Secondary | ICD-10-CM

## 2012-05-25 DIAGNOSIS — C50919 Malignant neoplasm of unspecified site of unspecified female breast: Secondary | ICD-10-CM | POA: Insufficient documentation

## 2012-05-25 DIAGNOSIS — C773 Secondary and unspecified malignant neoplasm of axilla and upper limb lymph nodes: Secondary | ICD-10-CM | POA: Insufficient documentation

## 2012-05-25 DIAGNOSIS — Z5111 Encounter for antineoplastic chemotherapy: Secondary | ICD-10-CM

## 2012-05-25 DIAGNOSIS — R05 Cough: Secondary | ICD-10-CM | POA: Insufficient documentation

## 2012-05-25 NOTE — Telephone Encounter (Signed)
I called patient to review her upcoming appointments.  Patient was able to repeat schedule.  She reports that she is doing well and does not have any questions at this time.  I encouraged her to call for questions, concerns or needs.

## 2012-05-25 NOTE — Progress Notes (Signed)
Echocardiogram 2D Echocardiogram has been performed.  Jonte Shiller 05/25/2012, 4:15 PM

## 2012-05-26 ENCOUNTER — Encounter (HOSPITAL_COMMUNITY): Payer: Self-pay

## 2012-05-26 ENCOUNTER — Encounter (HOSPITAL_COMMUNITY)
Admission: RE | Admit: 2012-05-26 | Discharge: 2012-05-26 | Disposition: A | Discharge status: Home / Self Care | Payer: BC Managed Care – PPO | Source: Ambulatory Visit | Attending: Oncology | Admitting: Oncology

## 2012-05-26 ENCOUNTER — Ambulatory Visit (HOSPITAL_COMMUNITY)
Admission: RE | Admit: 2012-05-26 | Discharge: 2012-05-26 | Disposition: A | Discharge status: Home / Self Care | Payer: BC Managed Care – PPO | Source: Ambulatory Visit | Attending: Oncology | Admitting: Oncology

## 2012-05-26 DIAGNOSIS — C50411 Malignant neoplasm of upper-outer quadrant of right female breast: Secondary | ICD-10-CM

## 2012-05-26 DIAGNOSIS — J90 Pleural effusion, not elsewhere classified: Secondary | ICD-10-CM | POA: Insufficient documentation

## 2012-05-26 DIAGNOSIS — R599 Enlarged lymph nodes, unspecified: Secondary | ICD-10-CM | POA: Insufficient documentation

## 2012-05-26 DIAGNOSIS — C7951 Secondary malignant neoplasm of bone: Secondary | ICD-10-CM | POA: Insufficient documentation

## 2012-05-26 DIAGNOSIS — R911 Solitary pulmonary nodule: Secondary | ICD-10-CM | POA: Insufficient documentation

## 2012-05-26 DIAGNOSIS — C50419 Malignant neoplasm of upper-outer quadrant of unspecified female breast: Secondary | ICD-10-CM | POA: Insufficient documentation

## 2012-05-26 LAB — GLUCOSE, CAPILLARY: Glucose-Capillary: 102 mg/dL — ABNORMAL HIGH (ref 70–99)

## 2012-05-26 MED ORDER — IOHEXOL 300 MG/ML  SOLN
80.0000 mL | Freq: Once | INTRAMUSCULAR | Status: AC | PRN
  Administered 2012-05-26: 80 mL via INTRAVENOUS

## 2012-05-26 MED ORDER — FLUDEOXYGLUCOSE F - 18 (FDG) INJECTION
15.5000 | Freq: Once | INTRAVENOUS | Status: AC | PRN
  Administered 2012-05-26: 15.5 via INTRAVENOUS

## 2012-05-27 ENCOUNTER — Ambulatory Visit (HOSPITAL_BASED_OUTPATIENT_CLINIC_OR_DEPARTMENT_OTHER): Payer: BC Managed Care – PPO

## 2012-05-27 ENCOUNTER — Ambulatory Visit (HOSPITAL_BASED_OUTPATIENT_CLINIC_OR_DEPARTMENT_OTHER): Payer: BC Managed Care – PPO | Admitting: Oncology

## 2012-05-27 ENCOUNTER — Encounter: Payer: Self-pay | Admitting: *Deleted

## 2012-05-27 ENCOUNTER — Telehealth (INDEPENDENT_AMBULATORY_CARE_PROVIDER_SITE_OTHER): Payer: Self-pay

## 2012-05-27 ENCOUNTER — Other Ambulatory Visit: Payer: Self-pay | Admitting: *Deleted

## 2012-05-27 ENCOUNTER — Other Ambulatory Visit: Payer: Self-pay | Admitting: Medical Oncology

## 2012-05-27 ENCOUNTER — Other Ambulatory Visit (HOSPITAL_BASED_OUTPATIENT_CLINIC_OR_DEPARTMENT_OTHER): Payer: BC Managed Care – PPO

## 2012-05-27 VITALS — BP 132/87 | HR 94 | Temp 97.7°F | Resp 20 | Ht 63.0 in | Wt 220.0 lb

## 2012-05-27 DIAGNOSIS — C7951 Secondary malignant neoplasm of bone: Secondary | ICD-10-CM

## 2012-05-27 DIAGNOSIS — M549 Dorsalgia, unspecified: Secondary | ICD-10-CM

## 2012-05-27 DIAGNOSIS — Z5111 Encounter for antineoplastic chemotherapy: Secondary | ICD-10-CM

## 2012-05-27 DIAGNOSIS — C50411 Malignant neoplasm of upper-outer quadrant of right female breast: Secondary | ICD-10-CM

## 2012-05-27 DIAGNOSIS — C50419 Malignant neoplasm of upper-outer quadrant of unspecified female breast: Secondary | ICD-10-CM

## 2012-05-27 DIAGNOSIS — Z170 Estrogen receptor positive status [ER+]: Secondary | ICD-10-CM

## 2012-05-27 DIAGNOSIS — C50919 Malignant neoplasm of unspecified site of unspecified female breast: Secondary | ICD-10-CM

## 2012-05-27 LAB — COMPREHENSIVE METABOLIC PANEL (CC13)
BUN: 15.9 mg/dL (ref 7.0–26.0)
CO2: 23 mEq/L (ref 22–29)
Calcium: 8.8 mg/dL (ref 8.4–10.4)
Chloride: 104 mEq/L (ref 98–107)
Creatinine: 0.8 mg/dL (ref 0.6–1.1)

## 2012-05-27 LAB — CBC WITH DIFFERENTIAL/PLATELET
Basophils Absolute: 0 10*3/uL (ref 0.0–0.1)
Eosinophils Absolute: 0.1 10*3/uL (ref 0.0–0.5)
HCT: 38.9 % (ref 34.8–46.6)
LYMPH%: 30.4 % (ref 14.0–49.7)
MONO#: 0.6 10*3/uL (ref 0.1–0.9)
NEUT#: 6.3 10*3/uL (ref 1.5–6.5)
NEUT%: 62.3 % (ref 38.4–76.8)
Platelets: 300 10*3/uL (ref 145–400)
WBC: 10.1 10*3/uL (ref 3.9–10.3)

## 2012-05-27 MED ORDER — SODIUM CHLORIDE 0.9 % IV SOLN
600.0000 mg/m2 | Freq: Once | INTRAVENOUS | Status: AC
  Administered 2012-05-27: 1260 mg via INTRAVENOUS
  Filled 2012-05-27: qty 63

## 2012-05-27 MED ORDER — SODIUM CHLORIDE 0.9 % IV SOLN
150.0000 mg | Freq: Once | INTRAVENOUS | Status: AC
  Administered 2012-05-27: 150 mg via INTRAVENOUS
  Filled 2012-05-27: qty 5

## 2012-05-27 MED ORDER — LORAZEPAM 2 MG/ML IJ SOLN
0.5000 mg | Freq: Once | INTRAMUSCULAR | Status: AC
  Administered 2012-05-27: 0.5 mg via INTRAVENOUS

## 2012-05-27 MED ORDER — LORAZEPAM 0.5 MG PO TABS: 0.5000 mg | ORAL_TABLET | Freq: Four times a day (QID) | ORAL | Status: DC | PRN

## 2012-05-27 MED ORDER — DEXAMETHASONE SODIUM PHOSPHATE 20 MG/5ML IJ SOLN
12.0000 mg | Freq: Once | INTRAMUSCULAR | Status: AC
  Administered 2012-05-27: 12 mg via INTRAVENOUS

## 2012-05-27 MED ORDER — DEXAMETHASONE 4 MG PO TABS: ORAL_TABLET | ORAL | Status: DC

## 2012-05-27 MED ORDER — PROCHLORPERAZINE MALEATE 10 MG PO TABS: 10.0000 mg | ORAL_TABLET | Freq: Four times a day (QID) | ORAL | Status: DC | PRN

## 2012-05-27 MED ORDER — HEPARIN SOD (PORK) LOCK FLUSH 100 UNIT/ML IV SOLN
500.0000 [IU] | Freq: Once | INTRAVENOUS | Status: AC | PRN
  Administered 2012-05-27: 500 [IU]
  Filled 2012-05-27: qty 5

## 2012-05-27 MED ORDER — SODIUM CHLORIDE 0.9 % IV SOLN
Freq: Once | INTRAVENOUS | Status: AC
  Administered 2012-05-27: 15:00:00 via INTRAVENOUS

## 2012-05-27 MED ORDER — ONDANSETRON HCL 8 MG PO TABS: 8.0000 mg | ORAL_TABLET | Freq: Two times a day (BID) | ORAL | Status: DC | PRN

## 2012-05-27 MED ORDER — DOXORUBICIN HCL CHEMO IV INJECTION 2 MG/ML
60.0000 mg/m2 | Freq: Once | INTRAVENOUS | Status: AC
  Administered 2012-05-27: 126 mg via INTRAVENOUS
  Filled 2012-05-27: qty 63

## 2012-05-27 MED ORDER — SODIUM CHLORIDE 0.9 % IJ SOLN
10.0000 mL | INTRAMUSCULAR | Status: DC | PRN
  Administered 2012-05-27: 10 mL
  Filled 2012-05-27: qty 10

## 2012-05-27 MED ORDER — LIDOCAINE-PRILOCAINE 2.5-2.5 % EX CREA: TOPICAL_CREAM | CUTANEOUS | Status: DC | PRN

## 2012-05-27 MED ORDER — PROCHLORPERAZINE 25 MG RE SUPP: 25.0000 mg | Freq: Two times a day (BID) | RECTAL | Status: DC | PRN

## 2012-05-27 MED ORDER — PALONOSETRON HCL INJECTION 0.25 MG/5ML
0.2500 mg | Freq: Once | INTRAVENOUS | Status: AC
Start: 2012-05-27 — End: 2012-05-27
  Administered 2012-05-27: 0.25 mg via INTRAVENOUS

## 2012-05-27 NOTE — Patient Instructions (Addendum)
We discussed your staging studies including PET CT.  We discussed the biopsy results as well.  We discussed chemotherapy to be started today consisting of Adriamycin Cytoxan given every 2 weeks for a total of 4 cycles followed by Taxol weekly for 12 weeks.  We discussed use of antinausea medicines after chemotherapy.  We discussed Neulasta injection on 5/3.  I will plan on seeing you back on May 9.

## 2012-05-27 NOTE — Telephone Encounter (Signed)
Called pt to check on her after the PAC insertion and she is doing good. I spoke to Dr Dwain Sarna about the pt having a f/u appt with [place] and he advised pt didn't need a f/u visit at this time due to her having treatments at the cancer center. The pt will get referred back to Dr Dwain Sarna after she completes her treatments. The pt understands and knows to call us if any changes.

## 2012-05-27 NOTE — Progress Notes (Signed)
Mailed after appt letter to pt. 

## 2012-05-27 NOTE — Progress Notes (Signed)
Antiemetic instructions reviewed, pt voiced understanding. Written instructions given as well.

## 2012-05-27 NOTE — Patient Instructions (Addendum)
[facility] Discharge Instructions for Patients Receiving Chemotherapy  Today you received the following chemotherapy agents ADRIAMYCIN, CYTOXAN  To help prevent nausea and vomiting after your treatment, we encourage you to take your nausea medication as prescribed   If you develop nausea and vomiting that is not controlled by your nausea medication, call the clinic. If it is after clinic hours your family physician or the after hours number for the clinic or go to the Emergency Department.   BELOW ARE SYMPTOMS THAT SHOULD BE REPORTED IMMEDIATELY:  *FEVER GREATER THAN 100.5 F  *CHILLS WITH OR WITHOUT FEVER  NAUSEA AND VOMITING THAT IS NOT CONTROLLED WITH YOUR NAUSEA MEDICATION  *UNUSUAL SHORTNESS OF BREATH  *UNUSUAL BRUISING OR BLEEDING  TENDERNESS IN MOUTH AND THROAT WITH OR WITHOUT PRESENCE OF ULCERS  *URINARY PROBLEMS  *BOWEL PROBLEMS  UNUSUAL RASH Items with * indicate a potential emergency and should be followed up as soon as possible.  One of the nurses will contact you Monday after your treatment. Please let the nurse know about any problems that you may have experienced. Feel free to call the clinic you have any questions or concerns. The clinic phone number is [phone].   I have been informed and understand all the instructions given to me. I know to contact the clinic, my physician, or go to the Emergency Department if any problems should occur. I do not have any questions at this time, but understand that I may call the clinic during office hours   should I have any questions or need assistance in obtaining follow up care.    __________________________________________  _____________  __________ Signature of Patient or Authorized Representative            Date                   Time    __________________________________________ Nurse's Signature   Cyclophosphamide injection What is this medicine? CYCLOPHOSPHAMIDE (sye kloe FOSS fa mide)  is a chemotherapy drug. It slows the growth of cancer cells. This medicine is used to treat many types of cancer like lymphoma, myeloma, leukemia, breast cancer, and ovarian cancer, to name a few. It is also used to treat nephrotic syndrome in children. This medicine may be used for other purposes; ask your health care provider or pharmacist if you have questions. What should I tell my health care provider before I take this medicine? They need to know if you have any of these conditions: -blood disorders -history of other chemotherapy -history of radiation therapy -infection -kidney disease -liver disease -tumors in the bone marrow -an unusual or allergic reaction to cyclophosphamide, other chemotherapy, other medicines, foods, dyes, or preservatives -pregnant or trying to get pregnant -breast-feeding How should I use this medicine? This drug is usually given as an injection into a vein or muscle or by infusion into a vein. It is administered in a hospital or clinic by a specially trained health care professional. Talk to your pediatrician regarding the use of this medicine in children. While this drug may be prescribed for selected conditions, precautions do apply. Overdosage: If you think you have taken too much of this medicine contact a poison control center or emergency room at once. NOTE: This medicine is only for you. Do not share this medicine with others. What if I miss a dose? It is important not to miss your dose. Call your doctor or health care professional if you are unable to keep an appointment. What  may interact with this medicine? Do not take this medicine with any of the following medications: -mibefradil -nalidixic acid This medicine may also interact with the following medications: -doxorubicin -etanercept -medicines to increase blood counts like filgrastim, pegfilgrastim, sargramostim -medicines that block muscle or nerve pain -[place]. John's  Wort -phenobarbital -succinylcholine chloride -trastuzumab -vaccines Talk to your doctor or health care professional before taking any of these medicines: -acetaminophen -aspirin -ibuprofen -ketoprofen -naproxen This list may not describe all possible interactions. Give your health care provider a list of all the medicines, herbs, non-prescription drugs, or dietary supplements you use. Also tell them if you smoke, drink alcohol, or use illegal drugs. Some items may interact with your medicine. What should I watch for while using this medicine? Visit your doctor for checks on your progress. This drug may make you feel generally unwell. This is not uncommon, as chemotherapy can affect healthy cells as well as cancer cells. Report any side effects. Continue your course of treatment even though you feel ill unless your doctor tells you to stop. Drink water or other fluids as directed. Urinate often, even at night. In some cases, you may be given additional medicines to help with side effects. Follow all directions for their use. Call your doctor or health care professional for advice if you get a fever, chills or sore throat, or other symptoms of a cold or flu. Do not treat yourself. This drug decreases your body's ability to fight infections. Try to avoid being around people who are sick. This medicine may increase your risk to bruise or bleed. Call your doctor or health care professional if you notice any unusual bleeding. Be careful brushing and flossing your teeth or using a toothpick because you may get an infection or bleed more easily. If you have any dental work done, tell your dentist you are receiving this medicine. Avoid taking products that contain aspirin, acetaminophen, ibuprofen, naproxen, or ketoprofen unless instructed by your doctor. These medicines may hide a fever. Do not become pregnant while taking this medicine. Women should inform their doctor if they wish to become pregnant  or think they might be pregnant. There is a potential for serious side effects to an unborn child. Talk to your health care professional or pharmacist for more information. Do not breast-feed an infant while taking this medicine. Men should inform their doctor if they wish to father a child. This medicine may lower sperm counts. If you are going to have surgery, tell your doctor or health care professional that you have taken this medicine. What side effects may I notice from receiving this medicine? Side effects that you should report to your doctor or health care professional as soon as possible: -allergic reactions like skin rash, itching or hives, swelling of the face, lips, or tongue -low blood counts - this medicine may decrease the number of white blood cells, red blood cells and platelets. You may be at increased risk for infections and bleeding. -signs of infection - fever or chills, cough, sore throat, pain or difficulty passing urine -signs of decreased platelets or bleeding - bruising, pinpoint red spots on the skin, black, tarry stools, blood in the urine -signs of decreased red blood cells - unusually weak or tired, fainting spells, lightheadedness -breathing problems -dark urine -mouth sores -pain, swelling, redness at site where injected -swelling of the ankles, feet, hands -trouble passing urine or change in the amount of urine -weight gain -yellowing of the eyes or skin Side effects that usually  do not require medical attention (report to your doctor or health care professional if they continue or are bothersome): -changes in nail or skin color -diarrhea -hair loss -loss of appetite -missed menstrual periods -nausea, vomiting -stomach pain This list may not describe all possible side effects. Call your doctor for medical advice about side effects. You may report side effects to FDA at 1-800-FDA-1088. Where should I keep my medicine? This drug is given in a hospital or  clinic and will not be stored at home. NOTE: This sheet is a summary. It may not cover all possible information. If you have questions about this medicine, talk to your doctor, pharmacist, or health care provider.  2012, [employer]. (04/19/2007 2:32:25 PM)

## 2012-05-28 ENCOUNTER — Ambulatory Visit (HOSPITAL_BASED_OUTPATIENT_CLINIC_OR_DEPARTMENT_OTHER): Payer: BC Managed Care – PPO

## 2012-05-28 VITALS — BP 104/62 | HR 74 | Temp 97.5°F

## 2012-05-28 DIAGNOSIS — C50419 Malignant neoplasm of upper-outer quadrant of unspecified female breast: Secondary | ICD-10-CM

## 2012-05-28 MED ORDER — PEGFILGRASTIM INJECTION 6 MG/0.6ML
6.0000 mg | Freq: Once | SUBCUTANEOUS | Status: AC
  Administered 2012-05-28: 6 mg via SUBCUTANEOUS

## 2012-05-28 NOTE — Patient Instructions (Signed)
Paclitaxel injection What is this medicine? PACLITAXEL (PAK li TAX el) is a chemotherapy drug. It targets fast dividing cells, like cancer cells, and causes these cells to die. This medicine is used to treat ovarian cancer, breast cancer, and other cancers. This medicine may be used for other purposes; ask your health care provider or pharmacist if you have questions. What should I tell my health care provider before I take this medicine? They need to know if you have any of these conditions: -blood disorders -irregular heartbeat -infection (especially a virus infection such as chickenpox, cold sores, or herpes) -liver disease -previous or ongoing radiation therapy -an unusual or allergic reaction to paclitaxel, alcohol, polyoxyethylated castor oil, other chemotherapy agents, other medicines, foods, dyes, or preservatives -pregnant or trying to get pregnant -breast-feeding How should I use this medicine? This drug is given as an infusion into a vein. It is administered in a hospital or clinic by a specially trained health care professional. Talk to your pediatrician regarding the use of this medicine in children. Special care may be needed. Overdosage: If you think you have taken too much of this medicine contact a poison control center or emergency room at once. NOTE: This medicine is only for you. Do not share this medicine with others. What if I miss a dose? It is important not to miss your dose. Call your doctor or health care professional if you are unable to keep an appointment. What may interact with this medicine? Do not take this medicine with any of the following medications: -disulfiram -metronidazole This medicine may also interact with the following medications: -cyclosporine -dexamethasone -diazepam -ketoconazole -medicines to increase blood counts like filgrastim, pegfilgrastim, sargramostim -other chemotherapy drugs like cisplatin, doxorubicin, epirubicin, etoposide,  teniposide, vincristine -quinidine -testosterone -vaccines -verapamil Talk to your doctor or health care professional before taking any of these medicines: -acetaminophen -aspirin -ibuprofen -ketoprofen -naproxen This list may not describe all possible interactions. Give your health care provider a list of all the medicines, herbs, non-prescription drugs, or dietary supplements you use. Also tell them if you smoke, drink alcohol, or use illegal drugs. Some items may interact with your medicine. What should I watch for while using this medicine? Your condition will be monitored carefully while you are receiving this medicine. You will need important blood work done while you are taking this medicine. This drug may make you feel generally unwell. This is not uncommon, as chemotherapy can affect healthy cells as well as cancer cells. Report any side effects. Continue your course of treatment even though you feel ill unless your doctor tells you to stop. In some cases, you may be given additional medicines to help with side effects. Follow all directions for their use. Call your doctor or health care professional for advice if you get a fever, chills or sore throat, or other symptoms of a cold or flu. Do not treat yourself. This drug decreases your body's ability to fight infections. Try to avoid being around people who are sick. This medicine may increase your risk to bruise or bleed. Call your doctor or health care professional if you notice any unusual bleeding. Be careful brushing and flossing your teeth or using a toothpick because you may get an infection or bleed more easily. If you have any dental work done, tell your dentist you are receiving this medicine. Avoid taking products that contain aspirin, acetaminophen, ibuprofen, naproxen, or ketoprofen unless instructed by your doctor. These medicines may hide a fever. Do not become pregnant  while taking this medicine. Women should inform their  doctor if they wish to become pregnant or think they might be pregnant. There is a potential for serious side effects to an unborn child. Talk to your health care professional or pharmacist for more information. Do not breast-feed an infant while taking this medicine. Men are advised not to father a child while receiving this medicine. What side effects may I notice from receiving this medicine? Side effects that you should report to your doctor or health care professional as soon as possible: -allergic reactions like skin rash, itching or hives, swelling of the face, lips, or tongue -low blood counts - This drug may decrease the number of white blood cells, red blood cells and platelets. You may be at increased risk for infections and bleeding. -signs of infection - fever or chills, cough, sore throat, pain or difficulty passing urine -signs of decreased platelets or bleeding - bruising, pinpoint red spots on the skin, black, tarry stools, nosebleeds -signs of decreased red blood cells - unusually weak or tired, fainting spells, lightheadedness -breathing problems -chest pain -high or low blood pressure -mouth sores -nausea and vomiting -pain, swelling, redness or irritation at the injection site -pain, tingling, numbness in the hands or feet -slow or irregular heartbeat -swelling of the ankle, feet, hands Side effects that usually do not require medical attention (report to your doctor or health care professional if they continue or are bothersome): -bone pain -complete hair loss including hair on your head, underarms, pubic hair, eyebrows, and eyelashes -changes in the color of fingernails -diarrhea -loosening of the fingernails -loss of appetite -muscle or joint pain -red flush to skin -sweating This list may not describe all possible side effects. Call your doctor for medical advice about side effects. You may report side effects to FDA at 1-800-FDA-1088. Where should I keep my  medicine? This drug is given in a hospital or clinic and will not be stored at home. NOTE: This sheet is a summary. It may not cover all possible information. If you have questions about this medicine, talk to your doctor, pharmacist, or health care provider.  2013, [employer] Standard. (12/26/2007 11:54:26 AM) Pegfilgrastim injection What is this medicine? PEGFILGRASTIM (peg fil GRA stim) helps the body make more white blood cells. It is used to prevent infection in people with low amounts of white blood cells following cancer treatment. This medicine may be used for other purposes; ask your health care provider or pharmacist if you have questions. What should I tell my health care provider before I take this medicine? They need to know if you have any of these conditions: -sickle cell disease -an unusual or allergic reaction to pegfilgrastim, filgrastim, E.coli protein, other medicines, foods, dyes, or preservatives -pregnant or trying to get pregnant -breast-feeding How should I use this medicine? This medicine is for injection under the skin. It is usually given by a health care professional in a hospital or clinic setting. If you get this medicine at home, you will be taught how to prepare and give this medicine. Do not shake this medicine. Use exactly as directed. Take your medicine at regular intervals. Do not take your medicine more often than directed. It is important that you put your used needles and syringes in a special sharps container. Do not put them in a trash can. If you do not have a sharps container, call your pharmacist or healthcare provider to get one. Talk to your pediatrician regarding the use of this  medicine in children. While this drug may be prescribed for children who weigh more than 45 kg for selected conditions, precautions do apply Overdosage: If you think you have taken too much of this medicine contact a poison control center or emergency room at once. NOTE:  This medicine is only for you. Do not share this medicine with others. What if I miss a dose? If you miss a dose, take it as soon as you can. If it is almost time for your next dose, take only that dose. Do not take double or extra doses. What may interact with this medicine? -lithium -medicines for growth therapy This list may not describe all possible interactions. Give your health care provider a list of all the medicines, herbs, non-prescription drugs, or dietary supplements you use. Also tell them if you smoke, drink alcohol, or use illegal drugs. Some items may interact with your medicine. What should I watch for while using this medicine? Visit your doctor for regular check ups. You will need important blood work done while you are taking this medicine. What side effects may I notice from receiving this medicine? Side effects that you should report to your doctor or health care professional as soon as possible: -allergic reactions like skin rash, itching or hives, swelling of the face, lips, or tongue -breathing problems -fever -pain, redness, or swelling where injected -shoulder pain -stomach or side pain Side effects that usually do not require medical attention (report to your doctor or health care professional if they continue or are bothersome): -aches, pains -headache -loss of appetite -nausea, vomiting -unusually tired This list may not describe all possible side effects. Call your doctor for medical advice about side effects. You may report side effects to FDA at 1-800-FDA-1088. Where should I keep my medicine? Keep out of the reach of children. Store in a refrigerator between 2 and 8 degrees C (36 and 46 degrees F). Do not freeze. Keep in carton to protect from light. Throw away this medicine if it is left out of the refrigerator for more than 48 hours. Throw away any unused medicine after the expiration date. NOTE: This sheet is a summary. It may not cover all possible  information. If you have questions about this medicine, talk to your doctor, pharmacist, or health care provider.  2013, [employer]. (08/15/2007 3:41:44 PM) [facility] Discharge Instructions for Patients Receiving Chemotherapy

## 2012-05-30 ENCOUNTER — Telehealth: Payer: Self-pay | Admitting: *Deleted

## 2012-05-30 ENCOUNTER — Other Ambulatory Visit: Payer: Self-pay | Admitting: Medical Oncology

## 2012-05-30 ENCOUNTER — Telehealth: Payer: Self-pay | Admitting: Oncology

## 2012-05-30 NOTE — Telephone Encounter (Signed)
, °

## 2012-05-30 NOTE — Telephone Encounter (Signed)
Patient denies any nausea/vomiting/diarrhea. States she feels "remarkably well". States her spouse is making sure she is getting enough hydration. Patient does not have appts scheduled at this time for blood counts or next MD/Chemo. Informed patient we would get these scheduled and give her a call. Fridays works best for patient.

## 2012-05-30 NOTE — Telephone Encounter (Signed)
Message copied by Kathlynn Grate on Mon May 30, 2012  4:04 PM ------      Message from: Gaylord Shih      Created: Fri May 27, 2012  4:25 PM      Regarding: chemo follow up call       1st time adriamycin, cytoxan. Dr Welton Flakes (original plan was [facility]) ------

## 2012-05-31 ENCOUNTER — Telehealth: Payer: Self-pay | Admitting: Genetic Counselor

## 2012-05-31 NOTE — Telephone Encounter (Signed)
Revealed negative multisite testing.  The lab is reflexing back to testing all of BRCA1 and 2, and if that is negative will reflex to the full panel if it is approved.

## 2012-06-03 ENCOUNTER — Encounter: Payer: Self-pay | Admitting: Oncology

## 2012-06-03 ENCOUNTER — Ambulatory Visit (HOSPITAL_BASED_OUTPATIENT_CLINIC_OR_DEPARTMENT_OTHER): Payer: BC Managed Care – PPO | Admitting: Oncology

## 2012-06-03 ENCOUNTER — Other Ambulatory Visit (HOSPITAL_BASED_OUTPATIENT_CLINIC_OR_DEPARTMENT_OTHER): Payer: BC Managed Care – PPO | Admitting: Lab

## 2012-06-03 ENCOUNTER — Telehealth: Payer: Self-pay | Admitting: Oncology

## 2012-06-03 ENCOUNTER — Telehealth: Payer: Self-pay | Admitting: *Deleted

## 2012-06-03 ENCOUNTER — Telehealth: Payer: Self-pay | Admitting: Genetic Counselor

## 2012-06-03 VITALS — BP 125/70 | HR 73 | Temp 98.3°F | Resp 20 | Ht 63.0 in | Wt 218.0 lb

## 2012-06-03 DIAGNOSIS — C50419 Malignant neoplasm of upper-outer quadrant of unspecified female breast: Secondary | ICD-10-CM

## 2012-06-03 DIAGNOSIS — C7951 Secondary malignant neoplasm of bone: Secondary | ICD-10-CM

## 2012-06-03 DIAGNOSIS — C50411 Malignant neoplasm of upper-outer quadrant of right female breast: Secondary | ICD-10-CM

## 2012-06-03 DIAGNOSIS — Z170 Estrogen receptor positive status [ER+]: Secondary | ICD-10-CM

## 2012-06-03 LAB — CBC WITH DIFFERENTIAL/PLATELET
BASO%: 0.4 % (ref 0.0–2.0)
Eosinophils Absolute: 0.1 10*3/uL (ref 0.0–0.5)
MCHC: 34.6 g/dL (ref 31.5–36.0)
MCV: 92.1 fL (ref 79.5–101.0)
MONO%: 4.9 % (ref 0.0–14.0)
NEUT#: 0.9 10*3/uL — ABNORMAL LOW (ref 1.5–6.5)
RBC: 4.13 10*6/uL (ref 3.70–5.45)
RDW: 13.5 % (ref 11.2–14.5)
WBC: 3.2 10*3/uL — ABNORMAL LOW (ref 3.9–10.3)

## 2012-06-03 LAB — COMPREHENSIVE METABOLIC PANEL (CC13)
ALT: 15 U/L (ref 0–55)
AST: 12 U/L (ref 5–34)
Albumin: 3.1 g/dL — ABNORMAL LOW (ref 3.5–5.0)
Alkaline Phosphatase: 81 U/L (ref 40–150)
Glucose: 85 mg/dl (ref 70–99)
Potassium: 4.6 mEq/L (ref 3.5–5.1)
Sodium: 136 mEq/L (ref 136–145)
Total Bilirubin: 0.33 mg/dL (ref 0.20–1.20)
Total Protein: 6.3 g/dL — ABNORMAL LOW (ref 6.4–8.3)

## 2012-06-03 MED ORDER — ALPRAZOLAM 0.25 MG PO TABS: 0.2500 mg | ORAL_TABLET | Freq: Three times a day (TID) | ORAL | Status: DC | PRN

## 2012-06-03 MED ORDER — CIPROFLOXACIN HCL 500 MG PO TABS: 500.0000 mg | ORAL_TABLET | Freq: Two times a day (BID) | ORAL | Status: DC

## 2012-06-03 MED ORDER — LORAZEPAM 0.5 MG PO TABS: 0.5000 mg | ORAL_TABLET | Freq: Three times a day (TID) | ORAL | Status: DC

## 2012-06-03 NOTE — Telephone Encounter (Signed)
I called patient to check in and see if she has any questions or needs at this time.  Left voicemail message to return my call.

## 2012-06-03 NOTE — Telephone Encounter (Signed)
Revealed negative BRCA testing.  Breast/ovarian cancer panel is pending.
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
## 2012-06-10 ENCOUNTER — Encounter: Payer: Self-pay | Admitting: Oncology

## 2012-06-10 ENCOUNTER — Ambulatory Visit (HOSPITAL_BASED_OUTPATIENT_CLINIC_OR_DEPARTMENT_OTHER): Payer: BC Managed Care – PPO | Admitting: Oncology

## 2012-06-10 ENCOUNTER — Ambulatory Visit: Payer: BC Managed Care – PPO

## 2012-06-10 ENCOUNTER — Other Ambulatory Visit (HOSPITAL_BASED_OUTPATIENT_CLINIC_OR_DEPARTMENT_OTHER): Payer: BC Managed Care – PPO | Admitting: Lab

## 2012-06-10 ENCOUNTER — Ambulatory Visit (HOSPITAL_BASED_OUTPATIENT_CLINIC_OR_DEPARTMENT_OTHER): Payer: BC Managed Care – PPO

## 2012-06-10 VITALS — BP 155/79 | HR 75 | Temp 97.7°F | Resp 20 | Ht 63.0 in | Wt 223.6 lb

## 2012-06-10 DIAGNOSIS — C7951 Secondary malignant neoplasm of bone: Secondary | ICD-10-CM

## 2012-06-10 DIAGNOSIS — C7952 Secondary malignant neoplasm of bone marrow: Secondary | ICD-10-CM

## 2012-06-10 DIAGNOSIS — C50419 Malignant neoplasm of upper-outer quadrant of unspecified female breast: Secondary | ICD-10-CM

## 2012-06-10 DIAGNOSIS — C50411 Malignant neoplasm of upper-outer quadrant of right female breast: Secondary | ICD-10-CM

## 2012-06-10 DIAGNOSIS — Z5111 Encounter for antineoplastic chemotherapy: Secondary | ICD-10-CM

## 2012-06-10 DIAGNOSIS — Z170 Estrogen receptor positive status [ER+]: Secondary | ICD-10-CM

## 2012-06-10 LAB — CBC WITH DIFFERENTIAL/PLATELET
BASO%: 0.6 % (ref 0.0–2.0)
EOS%: 0.6 % (ref 0.0–7.0)
HCT: 35.5 % (ref 34.8–46.6)
LYMPH%: 24.9 % (ref 14.0–49.7)
MCH: 31.3 pg (ref 25.1–34.0)
MCHC: 33.8 g/dL (ref 31.5–36.0)
MCV: 92.4 fL (ref 79.5–101.0)
MONO%: 6.5 % (ref 0.0–14.0)
NEUT%: 67.4 % (ref 38.4–76.8)
Platelets: 140 10*3/uL — ABNORMAL LOW (ref 145–400)
lymph#: 2.1 10*3/uL (ref 0.9–3.3)

## 2012-06-10 LAB — COMPREHENSIVE METABOLIC PANEL (CC13)
AST: 17 U/L (ref 5–34)
Alkaline Phosphatase: 64 U/L (ref 40–150)
BUN: 12.3 mg/dL (ref 7.0–26.0)
Creatinine: 0.8 mg/dL (ref 0.6–1.1)
Total Bilirubin: 0.21 mg/dL (ref 0.20–1.20)

## 2012-06-10 MED ORDER — SODIUM CHLORIDE 0.9 % IV SOLN
Freq: Once | INTRAVENOUS | Status: AC
  Administered 2012-06-10: 11:00:00 via INTRAVENOUS

## 2012-06-10 MED ORDER — SODIUM CHLORIDE 0.9 % IV SOLN
600.0000 mg/m2 | Freq: Once | INTRAVENOUS | Status: AC
  Administered 2012-06-10: 1260 mg via INTRAVENOUS
  Filled 2012-06-10: qty 63

## 2012-06-10 MED ORDER — DOXORUBICIN HCL CHEMO IV INJECTION 2 MG/ML
60.0000 mg/m2 | Freq: Once | INTRAVENOUS | Status: AC
  Administered 2012-06-10: 126 mg via INTRAVENOUS
  Filled 2012-06-10: qty 63

## 2012-06-10 MED ORDER — DEXAMETHASONE SODIUM PHOSPHATE 20 MG/5ML IJ SOLN
12.0000 mg | Freq: Once | INTRAMUSCULAR | Status: AC
  Administered 2012-06-10: 12 mg via INTRAVENOUS

## 2012-06-10 MED ORDER — DENOSUMAB 120 MG/1.7ML ~~LOC~~ SOLN
120.0000 mg | Freq: Once | SUBCUTANEOUS | Status: AC
  Administered 2012-06-10: 120 mg via SUBCUTANEOUS
  Filled 2012-06-10: qty 1.7

## 2012-06-10 MED ORDER — PALONOSETRON HCL INJECTION 0.25 MG/5ML
0.2500 mg | Freq: Once | INTRAVENOUS | Status: AC
Start: 2012-06-10 — End: 2012-06-10
  Administered 2012-06-10: 0.25 mg via INTRAVENOUS

## 2012-06-10 MED ORDER — GOSERELIN ACETATE 3.6 MG ~~LOC~~ IMPL
3.6000 mg | DRUG_IMPLANT | SUBCUTANEOUS | Status: DC
  Administered 2012-06-10: 3.6 mg via SUBCUTANEOUS
  Filled 2012-06-10: qty 3.6

## 2012-06-10 MED ORDER — SODIUM CHLORIDE 0.9 % IJ SOLN
10.0000 mL | INTRAMUSCULAR | Status: DC | PRN
  Administered 2012-06-10: 10 mL
  Filled 2012-06-10: qty 10

## 2012-06-10 MED ORDER — HEPARIN SOD (PORK) LOCK FLUSH 100 UNIT/ML IV SOLN
500.0000 [IU] | Freq: Once | INTRAVENOUS | Status: AC | PRN
  Administered 2012-06-10: 500 [IU]
  Filled 2012-06-10: qty 5

## 2012-06-10 MED ORDER — SODIUM CHLORIDE 0.9 % IV SOLN
150.0000 mg | Freq: Once | INTRAVENOUS | Status: AC
  Administered 2012-06-10: 150 mg via INTRAVENOUS
  Filled 2012-06-10: qty 5

## 2012-06-10 MED ORDER — LORAZEPAM 2 MG/ML IJ SOLN
0.5000 mg | Freq: Once | INTRAMUSCULAR | Status: AC
  Administered 2012-06-10: 0.5 mg via INTRAVENOUS
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
## 2012-06-10 NOTE — Patient Instructions (Addendum)
[facility] Discharge Instructions for Patients Receiving Chemotherapy  Today you received the following chemotherapy agents Adriamycin, Cytoxan, Xgeva and Zoladex.  To help prevent nausea and vomiting after your treatment, we encourage you to take your nausea medication as prescribed.   If you develop nausea and vomiting that is not controlled by your nausea medication, call the clinic. If it is after clinic hours your family physician or the after hours number for the clinic or go to the Emergency Department.   BELOW ARE SYMPTOMS THAT SHOULD BE REPORTED IMMEDIATELY:  *FEVER GREATER THAN 100.5 F  *CHILLS WITH OR WITHOUT FEVER  NAUSEA AND VOMITING THAT IS NOT CONTROLLED WITH YOUR NAUSEA MEDICATION  *UNUSUAL SHORTNESS OF BREATH  *UNUSUAL BRUISING OR BLEEDING  TENDERNESS IN MOUTH AND THROAT WITH OR WITHOUT PRESENCE OF ULCERS  *URINARY PROBLEMS  *BOWEL PROBLEMS  UNUSUAL RASH Items with * indicate a potential emergency and should be followed up as soon as possible.  One of the nurses will contact you 24 hours after your treatment. Please let the nurse know about any problems that you may have experienced. Feel free to call the clinic you have any questions or concerns. The clinic phone number is [phone].   I have been informed and understand all the instructions given to me. I know to contact the clinic, my physician, or go to the Emergency Department if any problems should occur. I do not have any questions at this time, but understand that I may call the clinic during office hours   should I have any questions or need assistance in obtaining follow up care.    __________________________________________  _____________  __________ Signature of Patient or Authorized Representative            Date                   Time    __________________________________________ Nurse's Signature

## 2012-06-10 NOTE — Patient Instructions (Addendum)
Biotene for mouth rinses  The natural dentist from the vitamin store  I will see you back in 1 week

## 2012-06-10 NOTE — Progress Notes (Signed)
FAXED REQUEST TO [employer] [place] FOR HER XGEVA.  I SPOKE TO [facility].  REF #[id number].  SHE SAID TO FAX IT TO [phone] AND I ALSO FAXED IT TO  [phone] (WHICH IS ON THE FORM) ALONG WITH 26 PAGES OF NOTES.

## 2012-06-11 ENCOUNTER — Ambulatory Visit (HOSPITAL_BASED_OUTPATIENT_CLINIC_OR_DEPARTMENT_OTHER): Payer: BC Managed Care – PPO

## 2012-06-11 VITALS — BP 132/67 | HR 86 | Temp 98.0°F | Resp 20

## 2012-06-11 DIAGNOSIS — Z5189 Encounter for other specified aftercare: Secondary | ICD-10-CM

## 2012-06-11 DIAGNOSIS — C7951 Secondary malignant neoplasm of bone: Secondary | ICD-10-CM

## 2012-06-11 DIAGNOSIS — C50419 Malignant neoplasm of upper-outer quadrant of unspecified female breast: Secondary | ICD-10-CM

## 2012-06-11 MED ORDER — PEGFILGRASTIM INJECTION 6 MG/0.6ML
6.0000 mg | Freq: Once | SUBCUTANEOUS | Status: AC
  Administered 2012-06-11: 6 mg via SUBCUTANEOUS

## 2012-06-17 ENCOUNTER — Other Ambulatory Visit (HOSPITAL_BASED_OUTPATIENT_CLINIC_OR_DEPARTMENT_OTHER): Payer: BC Managed Care – PPO | Admitting: Lab

## 2012-06-17 ENCOUNTER — Ambulatory Visit (HOSPITAL_BASED_OUTPATIENT_CLINIC_OR_DEPARTMENT_OTHER): Payer: BC Managed Care – PPO | Admitting: Oncology

## 2012-06-17 ENCOUNTER — Encounter: Payer: Self-pay | Admitting: Oncology

## 2012-06-17 ENCOUNTER — Telehealth: Payer: Self-pay | Admitting: Oncology

## 2012-06-17 VITALS — BP 108/73 | HR 75 | Temp 97.6°F | Resp 20 | Ht 63.0 in | Wt 219.2 lb

## 2012-06-17 DIAGNOSIS — Z170 Estrogen receptor positive status [ER+]: Secondary | ICD-10-CM

## 2012-06-17 DIAGNOSIS — C50419 Malignant neoplasm of upper-outer quadrant of unspecified female breast: Secondary | ICD-10-CM

## 2012-06-17 DIAGNOSIS — C50411 Malignant neoplasm of upper-outer quadrant of right female breast: Secondary | ICD-10-CM

## 2012-06-17 DIAGNOSIS — C7951 Secondary malignant neoplasm of bone: Secondary | ICD-10-CM

## 2012-06-17 DIAGNOSIS — D709 Neutropenia, unspecified: Secondary | ICD-10-CM

## 2012-06-17 DIAGNOSIS — C7952 Secondary malignant neoplasm of bone marrow: Secondary | ICD-10-CM

## 2012-06-17 LAB — COMPREHENSIVE METABOLIC PANEL (CC13)
ALT: 22 U/L (ref 0–55)
Albumin: 3.2 g/dL — ABNORMAL LOW (ref 3.5–5.0)
Alkaline Phosphatase: 80 U/L (ref 40–150)
CO2: 23 mEq/L (ref 22–29)
Glucose: 91 mg/dl (ref 70–99)
Potassium: 4.6 mEq/L (ref 3.5–5.1)
Sodium: 138 mEq/L (ref 136–145)
Total Protein: 6.2 g/dL — ABNORMAL LOW (ref 6.4–8.3)

## 2012-06-17 LAB — CBC WITH DIFFERENTIAL/PLATELET
Eosinophils Absolute: 0.1 10*3/uL (ref 0.0–0.5)
MONO#: 0.2 10*3/uL (ref 0.1–0.9)
MONO%: 11.1 % (ref 0.0–14.0)
NEUT#: 0.6 10*3/uL — ABNORMAL LOW (ref 1.5–6.5)
RBC: 3.8 10*6/uL (ref 3.70–5.45)
RDW: 14.1 % (ref 11.2–14.5)
WBC: 1.9 10*3/uL — ABNORMAL LOW (ref 3.9–10.3)

## 2012-06-17 NOTE — Patient Instructions (Addendum)
Doing well, you are neutropenic (low counts) monitor your temps and call if you have a temprature higher than 100.5  Begin Cipro 500 mg twice a day for the next 7 days  contiune Biotene, natural dentist, and the magic mouth wash  I will see you back in 1 week

## 2012-06-21 ENCOUNTER — Other Ambulatory Visit: Payer: Self-pay | Admitting: Emergency Medicine

## 2012-06-21 MED ORDER — FLUCONAZOLE 100 MG PO TABS: 200.0000 mg | ORAL_TABLET | Freq: Every day | ORAL | Status: DC

## 2012-06-24 ENCOUNTER — Encounter: Payer: Self-pay | Admitting: Oncology

## 2012-06-24 ENCOUNTER — Telehealth: Payer: Self-pay | Admitting: *Deleted

## 2012-06-24 ENCOUNTER — Other Ambulatory Visit: Payer: Self-pay | Admitting: *Deleted

## 2012-06-24 ENCOUNTER — Ambulatory Visit (HOSPITAL_BASED_OUTPATIENT_CLINIC_OR_DEPARTMENT_OTHER): Payer: BC Managed Care – PPO | Admitting: Oncology

## 2012-06-24 ENCOUNTER — Other Ambulatory Visit (HOSPITAL_BASED_OUTPATIENT_CLINIC_OR_DEPARTMENT_OTHER): Payer: BC Managed Care – PPO | Admitting: Lab

## 2012-06-24 ENCOUNTER — Ambulatory Visit (HOSPITAL_BASED_OUTPATIENT_CLINIC_OR_DEPARTMENT_OTHER): Payer: BC Managed Care – PPO

## 2012-06-24 ENCOUNTER — Ambulatory Visit: Payer: BC Managed Care – PPO

## 2012-06-24 VITALS — BP 126/83 | HR 73 | Temp 97.8°F | Resp 20 | Ht 63.0 in | Wt 220.7 lb

## 2012-06-24 DIAGNOSIS — C7951 Secondary malignant neoplasm of bone: Secondary | ICD-10-CM

## 2012-06-24 DIAGNOSIS — C50411 Malignant neoplasm of upper-outer quadrant of right female breast: Secondary | ICD-10-CM

## 2012-06-24 DIAGNOSIS — Z170 Estrogen receptor positive status [ER+]: Secondary | ICD-10-CM

## 2012-06-24 DIAGNOSIS — C7952 Secondary malignant neoplasm of bone marrow: Secondary | ICD-10-CM

## 2012-06-24 DIAGNOSIS — C50419 Malignant neoplasm of upper-outer quadrant of unspecified female breast: Secondary | ICD-10-CM

## 2012-06-24 DIAGNOSIS — Z5111 Encounter for antineoplastic chemotherapy: Secondary | ICD-10-CM

## 2012-06-24 LAB — CBC WITH DIFFERENTIAL/PLATELET
BASO%: 0.5 % (ref 0.0–2.0)
Basophils Absolute: 0.1 10*3/uL (ref 0.0–0.1)
Eosinophils Absolute: 0 10*3/uL (ref 0.0–0.5)
HCT: 34.9 % (ref 34.8–46.6)
HGB: 11.7 g/dL (ref 11.6–15.9)
LYMPH%: 15.3 % (ref 14.0–49.7)
MCHC: 33.5 g/dL (ref 31.5–36.0)
MONO#: 0.9 10*3/uL (ref 0.1–0.9)
NEUT%: 76.6 % (ref 38.4–76.8)
Platelets: 151 10*3/uL (ref 145–400)
WBC: 11.6 10*3/uL — ABNORMAL HIGH (ref 3.9–10.3)

## 2012-06-24 LAB — COMPREHENSIVE METABOLIC PANEL (CC13)
BUN: 13.6 mg/dL (ref 7.0–26.0)
CO2: 21 mEq/L — ABNORMAL LOW (ref 22–29)
Calcium: 8.6 mg/dL (ref 8.4–10.4)
Chloride: 107 mEq/L (ref 98–107)
Creatinine: 0.8 mg/dL (ref 0.6–1.1)
Glucose: 86 mg/dl (ref 70–99)

## 2012-06-24 MED ORDER — FOSAPREPITANT DIMEGLUMINE INJECTION 150 MG
150.0000 mg | Freq: Once | INTRAVENOUS | Status: AC
  Administered 2012-06-24: 150 mg via INTRAVENOUS
  Filled 2012-06-24: qty 5

## 2012-06-24 MED ORDER — MAGIC MOUTHWASH W/LIDOCAINE: 5.0000 mL | Freq: Four times a day (QID) | ORAL | Status: DC

## 2012-06-24 MED ORDER — NYSTATIN 100000 UNIT/ML MT SUSP: 500000.0000 [IU] | Freq: Four times a day (QID) | OROMUCOSAL | Status: DC

## 2012-06-24 MED ORDER — PALONOSETRON HCL INJECTION 0.25 MG/5ML
0.2500 mg | Freq: Once | INTRAVENOUS | Status: AC
  Administered 2012-06-24: 0.25 mg via INTRAVENOUS

## 2012-06-24 MED ORDER — SODIUM CHLORIDE 0.9 % IJ SOLN
10.0000 mL | INTRAMUSCULAR | Status: DC | PRN
  Administered 2012-06-24: 10 mL
  Filled 2012-06-24: qty 10

## 2012-06-24 MED ORDER — SODIUM CHLORIDE 0.9 % IV SOLN
Freq: Once | INTRAVENOUS | Status: AC
  Administered 2012-06-24: 12:00:00 via INTRAVENOUS

## 2012-06-24 MED ORDER — SODIUM CHLORIDE 0.9 % IV SOLN
600.0000 mg/m2 | Freq: Once | INTRAVENOUS | Status: AC
  Administered 2012-06-24: 1260 mg via INTRAVENOUS
  Filled 2012-06-24: qty 63

## 2012-06-24 MED ORDER — DEXAMETHASONE SODIUM PHOSPHATE 20 MG/5ML IJ SOLN
12.0000 mg | Freq: Once | INTRAMUSCULAR | Status: AC
  Administered 2012-06-24: 12 mg via INTRAVENOUS

## 2012-06-24 MED ORDER — HEPARIN SOD (PORK) LOCK FLUSH 100 UNIT/ML IV SOLN
500.0000 [IU] | Freq: Once | INTRAVENOUS | Status: AC | PRN
  Administered 2012-06-24: 500 [IU]
  Filled 2012-06-24: qty 5

## 2012-06-24 MED ORDER — ACETAMINOPHEN 500 MG PO TABS: 500.0000 mg | ORAL_TABLET | Freq: Once | ORAL | Status: DC

## 2012-06-24 MED ORDER — DOXORUBICIN HCL CHEMO IV INJECTION 2 MG/ML
60.0000 mg/m2 | Freq: Once | INTRAVENOUS | Status: AC
  Administered 2012-06-24: 126 mg via INTRAVENOUS
  Filled 2012-06-24: qty 63

## 2012-06-24 MED ORDER — LORAZEPAM 2 MG/ML IJ SOLN
0.5000 mg | Freq: Once | INTRAMUSCULAR | Status: AC
  Administered 2012-06-24: 0.5 mg via INTRAVENOUS

## 2012-06-24 MED ORDER — VALACYCLOVIR HCL 500 MG PO TABS: 500.0000 mg | ORAL_TABLET | Freq: Every day | ORAL | Status: DC

## 2012-06-24 MED ORDER — ACETAMINOPHEN 500 MG PO TABS
1000.0000 mg | ORAL_TABLET | Freq: Once | ORAL | Status: AC
  Administered 2012-06-24: 1000 mg via ORAL

## 2012-06-24 NOTE — Telephone Encounter (Signed)
Pharmacy called requesting ratio's of magic mouthwash ingredients.  Dr. Welton Flakes notified and would like 1:1 ratio.  Pharmacist notified.

## 2012-06-24 NOTE — Patient Instructions (Addendum)
[facility] Discharge Instructions for Patients Receiving Chemotherapy  Today you received the following chemotherapy agents Adriamycin/Cytoxan.  To help prevent nausea and vomiting after your treatment, we encourage you to take your nausea medication as directed.   If you develop nausea and vomiting that is not controlled by your nausea medication, call the clinic. If it is after clinic hours your family physician or the after hours number for the clinic or go to the Emergency Department.   BELOW ARE SYMPTOMS THAT SHOULD BE REPORTED IMMEDIATELY:  *FEVER GREATER THAN 100.5 F  *CHILLS WITH OR WITHOUT FEVER  NAUSEA AND VOMITING THAT IS NOT CONTROLLED WITH YOUR NAUSEA MEDICATION  *UNUSUAL SHORTNESS OF BREATH  *UNUSUAL BRUISING OR BLEEDING  TENDERNESS IN MOUTH AND THROAT WITH OR WITHOUT PRESENCE OF ULCERS  *URINARY PROBLEMS  *BOWEL PROBLEMS  UNUSUAL RASH Items with * indicate a potential emergency and should be followed up as soon as possible.

## 2012-06-25 ENCOUNTER — Ambulatory Visit (HOSPITAL_BASED_OUTPATIENT_CLINIC_OR_DEPARTMENT_OTHER): Payer: BC Managed Care – PPO

## 2012-06-25 VITALS — BP 161/60 | HR 91 | Temp 98.0°F | Resp 20

## 2012-06-25 DIAGNOSIS — C7951 Secondary malignant neoplasm of bone: Secondary | ICD-10-CM

## 2012-06-25 DIAGNOSIS — C50419 Malignant neoplasm of upper-outer quadrant of unspecified female breast: Secondary | ICD-10-CM

## 2012-06-25 DIAGNOSIS — Z5189 Encounter for other specified aftercare: Secondary | ICD-10-CM

## 2012-06-25 MED ORDER — PEGFILGRASTIM INJECTION 6 MG/0.6ML
6.0000 mg | Freq: Once | SUBCUTANEOUS | Status: AC
  Administered 2012-06-25: 6 mg via SUBCUTANEOUS

## 2012-06-28 ENCOUNTER — Telehealth: Payer: Self-pay | Admitting: Genetic Counselor

## 2012-06-28 NOTE — Telephone Encounter (Signed)
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  Made an appointment for her to come in at 10:30 on Friday June 6 to review results.

## 2012-07-01 ENCOUNTER — Ambulatory Visit (HOSPITAL_BASED_OUTPATIENT_CLINIC_OR_DEPARTMENT_OTHER): Payer: BC Managed Care – PPO | Admitting: Genetic Counselor

## 2012-07-01 ENCOUNTER — Encounter: Payer: Self-pay | Admitting: Genetic Counselor

## 2012-07-01 ENCOUNTER — Other Ambulatory Visit (HOSPITAL_BASED_OUTPATIENT_CLINIC_OR_DEPARTMENT_OTHER): Payer: BC Managed Care – PPO | Admitting: Lab

## 2012-07-01 ENCOUNTER — Ambulatory Visit (HOSPITAL_BASED_OUTPATIENT_CLINIC_OR_DEPARTMENT_OTHER): Payer: BC Managed Care – PPO | Admitting: Oncology

## 2012-07-01 VITALS — BP 85/58 | HR 86 | Temp 98.1°F | Resp 20 | Ht 63.0 in | Wt 223.7 lb

## 2012-07-01 DIAGNOSIS — C7951 Secondary malignant neoplasm of bone: Secondary | ICD-10-CM

## 2012-07-01 DIAGNOSIS — Z170 Estrogen receptor positive status [ER+]: Secondary | ICD-10-CM

## 2012-07-01 DIAGNOSIS — C7952 Secondary malignant neoplasm of bone marrow: Secondary | ICD-10-CM

## 2012-07-01 DIAGNOSIS — C50411 Malignant neoplasm of upper-outer quadrant of right female breast: Secondary | ICD-10-CM

## 2012-07-01 DIAGNOSIS — C50419 Malignant neoplasm of upper-outer quadrant of unspecified female breast: Secondary | ICD-10-CM

## 2012-07-01 DIAGNOSIS — IMO0002 Reserved for concepts with insufficient information to code with codable children: Secondary | ICD-10-CM

## 2012-07-01 DIAGNOSIS — C50919 Malignant neoplasm of unspecified site of unspecified female breast: Secondary | ICD-10-CM

## 2012-07-01 DIAGNOSIS — Z1501 Genetic susceptibility to malignant neoplasm of breast: Secondary | ICD-10-CM

## 2012-07-01 LAB — CBC WITH DIFFERENTIAL/PLATELET
Eosinophils Absolute: 0.1 10*3/uL (ref 0.0–0.5)
MONO#: 0.2 10*3/uL (ref 0.1–0.9)
NEUT#: 0.7 10*3/uL — ABNORMAL LOW (ref 1.5–6.5)
RBC: 3.4 10*6/uL — ABNORMAL LOW (ref 3.70–5.45)
RDW: 14.7 % — ABNORMAL HIGH (ref 11.2–14.5)
WBC: 1.9 10*3/uL — ABNORMAL LOW (ref 3.9–10.3)

## 2012-07-01 LAB — COMPREHENSIVE METABOLIC PANEL (CC13)
ALT: 24 U/L (ref 0–55)
Albumin: 3.1 g/dL — ABNORMAL LOW (ref 3.5–5.0)
Alkaline Phosphatase: 85 U/L (ref 40–150)
CO2: 23 mEq/L (ref 22–29)
Glucose: 90 mg/dl (ref 70–99)
Potassium: 4.5 mEq/L (ref 3.5–5.1)
Sodium: 136 mEq/L (ref 136–145)
Total Protein: 6 g/dL — ABNORMAL LOW (ref 6.4–8.3)

## 2012-07-01 NOTE — Patient Instructions (Addendum)
Doing well We discussed your genetics I will see you back in 1 week

## 2012-07-01 NOTE — Progress Notes (Signed)
Cheryl Moore, a 52 y.o. 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 She presents to clinic today to discuss the possibility of a genetic predisposition to cancer, and to further clarify her risks, as well as her family members' risks for cancer.   HISTORY OF PRESENT ILLNESS: In 2014, at the age of 53, Cheryl Moore was diagnosed with invasive ductal carcinoma of the breast. This is being treated with chemotherapy, surgery and radiation.  Cheryl Moore has stage IV breast cancer and reports that it is currently located in her bones.   Past Medical History  Diagnosis Date  . Breast cancer   . Anxiety   . Hot flashes     Past Surgical History  Procedure Laterality Date  . Cesarean section  2005  . Breast surgery Right     breast bx  . Breast biopsy Right 05/23/2012    Procedure: SKIN PUNCH BIOPSY RIGHT BREAST;  Surgeon: Emelia Loron, MD;  Location: [facility] OR;  Service: General;  Laterality: Right;  . Portacath placement Left 05/23/2012    Procedure: INSERTION PORT-A-CATH;  Surgeon: Emelia Loron, MD;  Location: [facility] OR;  Service: General;  Laterality: Left;    History  Substance Use Topics  . Smoking status: Former Smoker -- 0.25 packs/day for 5 years    Types: Cigarettes    Quit date: 05/21/2002  . Smokeless tobacco: Not on file  . Alcohol Use: Yes     Comment: 1-2 per month    FAMILY HISTORY:  We obtained a detailed, 4-generation family history.  Significant diagnoses are listed below: Family History  Problem Relation Age of Onset  . Adopted: Yes   GENETIC COUNSELING RISK ASSESSMENT, DISCUSSION, AND SUGGESTED FOLLOW UP: We discussed that mutations within the ATM gene convey a moderately increased risk for breast cancer.  Women with ATM mutations have a 25-30% lifetime risk for breast cancer.  Therefore, this mutation is partially responsible for her breast cancer, although there may have been other factors that helped trigger  it as well.  ATM mutations increase the risk for colon and pancreatic cancer as well, although the actual risk at this time is not quantified.  There has been some evidence that radiation treatment may increase the risk for breast cancer in the contralateral breast.  Despite this risk, we do not recommend declining radiation treatment for her breast cancer if it is recommended, as the risk for having a recurrence of breast cancer based on not going through radiation may be greater than her risk for getting breast cancer again from the radiation.  We discussed that her son is at 50% risk for having this same mutation, and may want to get tested at some point in the future.  Individuals with ATM mutations are at increased risk for having a child with Ataxia Telangiectasia (A-T).  Children with A-T are at increased risk for developing progressive ataxia, telangiectasia's as well as cancer (primarily lymphoma and leukemia) and infections.  These individuals are sensitive to radiation and therefore exposure to radiation is not recommended.  The carrier frequency for ATM mutations in the population is around 1/100.  Cheryl Moore seemed to understand the information and had her questions answered.  We recommend that she follow up with genetics every 1-2 years to determine whether specific management guidelines have been put in place for ATM mutation carriers as well as other recommendations.  The patient was seen for a total of 30 minutes,  greater than 50% of which was spent face-to-face counseling.  This plan is being carried out per Dr. Feliz Beam recommendations.  This note will also be sent to the referring provider via the electronic medical record. The patient will be supplied with a summary of this genetic counseling discussion as well as educational information on the discussed hereditary cancer syndromes following the conclusion of their visit.   Patient was discussed with Dr. Drue Second.   _______________________________________________________________________ For Office Staff:  Number of people involved in session: 1 Was an Intern/ student involved with case: no

## 2012-07-06 ENCOUNTER — Encounter: Payer: Self-pay | Admitting: Genetic Counselor

## 2012-07-08 ENCOUNTER — Other Ambulatory Visit (HOSPITAL_BASED_OUTPATIENT_CLINIC_OR_DEPARTMENT_OTHER): Payer: BC Managed Care – PPO | Admitting: Lab

## 2012-07-08 ENCOUNTER — Ambulatory Visit (HOSPITAL_BASED_OUTPATIENT_CLINIC_OR_DEPARTMENT_OTHER): Payer: BC Managed Care – PPO | Admitting: Oncology

## 2012-07-08 ENCOUNTER — Ambulatory Visit (HOSPITAL_BASED_OUTPATIENT_CLINIC_OR_DEPARTMENT_OTHER): Payer: BC Managed Care – PPO

## 2012-07-08 ENCOUNTER — Encounter: Payer: Self-pay | Admitting: Oncology

## 2012-07-08 ENCOUNTER — Ambulatory Visit: Payer: BC Managed Care – PPO

## 2012-07-08 VITALS — BP 146/81 | HR 80 | Temp 97.5°F | Resp 20 | Ht 63.0 in | Wt 223.7 lb

## 2012-07-08 DIAGNOSIS — C7952 Secondary malignant neoplasm of bone marrow: Secondary | ICD-10-CM

## 2012-07-08 DIAGNOSIS — Z170 Estrogen receptor positive status [ER+]: Secondary | ICD-10-CM

## 2012-07-08 DIAGNOSIS — C7951 Secondary malignant neoplasm of bone: Secondary | ICD-10-CM

## 2012-07-08 DIAGNOSIS — C50419 Malignant neoplasm of upper-outer quadrant of unspecified female breast: Secondary | ICD-10-CM

## 2012-07-08 DIAGNOSIS — Z5111 Encounter for antineoplastic chemotherapy: Secondary | ICD-10-CM

## 2012-07-08 DIAGNOSIS — C50411 Malignant neoplasm of upper-outer quadrant of right female breast: Secondary | ICD-10-CM

## 2012-07-08 LAB — CBC WITH DIFFERENTIAL/PLATELET
BASO%: 0.8 % (ref 0.0–2.0)
EOS%: 0.5 % (ref 0.0–7.0)
Eosinophils Absolute: 0.1 10*3/uL (ref 0.0–0.5)
MCHC: 35 g/dL (ref 31.5–36.0)
MCV: 91.1 fL (ref 79.5–101.0)
MONO%: 6.4 % (ref 0.0–14.0)
NEUT#: 10.8 10*3/uL — ABNORMAL HIGH (ref 1.5–6.5)
RBC: 3.62 10*6/uL — ABNORMAL LOW (ref 3.70–5.45)
RDW: 15.7 % — ABNORMAL HIGH (ref 11.2–14.5)
WBC: 13.3 10*3/uL — ABNORMAL HIGH (ref 3.9–10.3)

## 2012-07-08 LAB — COMPREHENSIVE METABOLIC PANEL (CC13)
ALT: 31 U/L (ref 0–55)
AST: 17 U/L (ref 5–34)
Albumin: 3.7 g/dL (ref 3.5–5.0)
Alkaline Phosphatase: 80 U/L (ref 40–150)
Glucose: 110 mg/dl — ABNORMAL HIGH (ref 70–99)
Potassium: 4.3 mEq/L (ref 3.5–5.1)
Sodium: 139 mEq/L (ref 136–145)
Total Bilirubin: 0.36 mg/dL (ref 0.20–1.20)
Total Protein: 6.9 g/dL (ref 6.4–8.3)

## 2012-07-08 MED ORDER — SODIUM CHLORIDE 0.9 % IJ SOLN
10.0000 mL | INTRAMUSCULAR | Status: DC | PRN
  Administered 2012-07-08: 10 mL
  Filled 2012-07-08: qty 10

## 2012-07-08 MED ORDER — PALONOSETRON HCL INJECTION 0.25 MG/5ML
0.2500 mg | Freq: Once | INTRAVENOUS | Status: AC
Start: 2012-07-08 — End: 2012-07-08
  Administered 2012-07-08: 0.25 mg via INTRAVENOUS

## 2012-07-08 MED ORDER — SODIUM CHLORIDE 0.9 % IV SOLN
600.0000 mg/m2 | Freq: Once | INTRAVENOUS | Status: AC
  Administered 2012-07-08: 1260 mg via INTRAVENOUS
  Filled 2012-07-08: qty 63

## 2012-07-08 MED ORDER — FOSAPREPITANT DIMEGLUMINE INJECTION 150 MG
150.0000 mg | Freq: Once | INTRAVENOUS | Status: AC
  Administered 2012-07-08: 150 mg via INTRAVENOUS
  Filled 2012-07-08: qty 5

## 2012-07-08 MED ORDER — DOXORUBICIN HCL CHEMO IV INJECTION 2 MG/ML
60.0000 mg/m2 | Freq: Once | INTRAVENOUS | Status: AC
  Administered 2012-07-08: 126 mg via INTRAVENOUS
  Filled 2012-07-08: qty 63

## 2012-07-08 MED ORDER — LORAZEPAM 2 MG/ML IJ SOLN
0.5000 mg | Freq: Once | INTRAMUSCULAR | Status: AC
  Administered 2012-07-08: 0.5 mg via INTRAVENOUS

## 2012-07-08 MED ORDER — SODIUM CHLORIDE 0.9 % IV SOLN
Freq: Once | INTRAVENOUS | Status: AC
  Administered 2012-07-08: 10:00:00 via INTRAVENOUS

## 2012-07-08 MED ORDER — DEXAMETHASONE SODIUM PHOSPHATE 20 MG/5ML IJ SOLN
12.0000 mg | Freq: Once | INTRAMUSCULAR | Status: AC
  Administered 2012-07-08: 12 mg via INTRAVENOUS

## 2012-07-08 MED ORDER — GOSERELIN ACETATE 3.6 MG ~~LOC~~ IMPL
3.6000 mg | DRUG_IMPLANT | SUBCUTANEOUS | Status: DC
  Administered 2012-07-08: 3.6 mg via SUBCUTANEOUS
  Filled 2012-07-08: qty 3.6

## 2012-07-08 MED ORDER — HEPARIN SOD (PORK) LOCK FLUSH 100 UNIT/ML IV SOLN
500.0000 [IU] | Freq: Once | INTRAVENOUS | Status: AC | PRN
  Administered 2012-07-08: 500 [IU]
  Filled 2012-07-08: qty 5

## 2012-07-08 MED ORDER — DENOSUMAB 120 MG/1.7ML ~~LOC~~ SOLN
120.0000 mg | Freq: Once | SUBCUTANEOUS | Status: AC
  Administered 2012-07-08: 120 mg via SUBCUTANEOUS
  Filled 2012-07-08: qty 1.7

## 2012-07-08 NOTE — Patient Instructions (Addendum)
Proceed with cycle 4 of AC today  Return on 6/14 for neulasta injection  We will see you back in 1 week for follow up

## 2012-07-08 NOTE — Patient Instructions (Addendum)
[facility] Discharge Instructions for Patients Receiving Chemotherapy  Today you received the following chemotherapy agents Doxorubicin and Cytoxan  To help prevent nausea and vomiting after your treatment, we encourage you to take your nausea medication.   If you develop nausea and vomiting that is not controlled by your nausea medication, call the clinic.   BELOW ARE SYMPTOMS THAT SHOULD BE REPORTED IMMEDIATELY:  *FEVER GREATER THAN 100.5 F  *CHILLS WITH OR WITHOUT FEVER  NAUSEA AND VOMITING THAT IS NOT CONTROLLED WITH YOUR NAUSEA MEDICATION  *UNUSUAL SHORTNESS OF BREATH  *UNUSUAL BRUISING OR BLEEDING  TENDERNESS IN MOUTH AND THROAT WITH OR WITHOUT PRESENCE OF ULCERS  *URINARY PROBLEMS  *BOWEL PROBLEMS  UNUSUAL RASH Items with * indicate a potential emergency and should be followed up as soon as possible.  Feel free to call the clinic you have any questions or concerns. The clinic phone number is [phone].  Denosumab injection What is this medicine? DENOSUMAB slows bone breakdown. It is used to treat osteoporosis in women after menopause and in men. This medicine is also used to prevent bone fractures and other bone problems caused by cancer bone metastases. This medicine may be used for other purposes; ask your health care provider or pharmacist if you have questions. What should I tell my health care provider before I take this medicine? They need to know if you have any of these conditions: -dental disease -eczema -infection or history of infections -kidney disease or on dialysis -low blood calcium or vitamin D -malabsorption syndrome -scheduled to have surgery or tooth extraction -taking medicine that contains denosumab -thyroid or parathyroid disease -an unusual reaction to denosumab, other medicines, foods, dyes, or preservatives -pregnant or trying to get pregnant -breast-feeding How should I use this medicine? This medicine is for  injection under the skin. It is given by a health care professional in a hospital or clinic setting. If you are getting Prolia, a special MedGuide will be given to you by the pharmacist with each prescription and refill. Be sure to read this information carefully each time. Talk to your pediatrician regarding the use of this medicine in children. Special care may be needed. Overdosage: If you think you've taken too much of this medicine contact a poison control center or emergency room at once. Overdosage: If you think you have taken too much of this medicine contact a poison control center or emergency room at once. NOTE: This medicine is only for you. Do not share this medicine with others. What if I miss a dose? It is important not to miss your dose. Call your doctor or health care professional if you are unable to keep an appointment. What may interact with this medicine? Do not take this medicine with any of the following medications: -other medicines containing denosumab This medicine may also interact with the following medications: -medicines that suppress the immune system -medicines that treat cancer -steroid medicines like prednisone or cortisone This list may not describe all possible interactions. Give your health care provider a list of all the medicines, herbs, non-prescription drugs, or dietary supplements you use. Also tell them if you smoke, drink alcohol, or use illegal drugs. Some items may interact with your medicine. What should I watch for while using this medicine? Visit your doctor or health care professional for regular checks on your progress. Your doctor or health care professional may order blood tests and other tests to see how you are doing. Call your doctor or health care  professional if you get a cold or other infection while receiving this medicine. Do not treat yourself. This medicine may decrease your body's ability to fight infection. You should make sure you  get enough calcium and vitamin D while you are taking this medicine, unless your doctor tells you not to. Discuss the foods you eat and the vitamins you take with your health care professional. See your dentist regularly. Brush and floss your teeth as directed. Before you have any dental work done, tell your dentist you are receiving this medicine. What side effects may I notice from receiving this medicine? Side effects that you should report to your doctor or health care professional as soon as possible: -allergic reactions like skin rash, itching or hives, swelling of the face, lips, or tongue -breathing problems -chest pain -fast, irregular heartbeat -feeling faint or lightheaded, falls -fever, chills, or any other sign of infection -muscle spasms, tightening, or twitches -numbness or tingling -skin blisters or bumps, or is dry, peels, or red -slow healing or unexplained pain in the mouth or jaw -unusual bleeding or bruising Side effects that usually do not require medical attention (Report these to your doctor or health care professional if they continue or are bothersome.): -muscle pain -stomach upset, gas This list may not describe all possible side effects. Call your doctor for medical advice about side effects. You may report side effects to FDA at 1-800-FDA-1088. Where should I keep my medicine? This medicine is only given in a clinic, doctor's office, or other health care setting and will not be stored at home. NOTE: This sheet is a summary. It may not cover all possible information. If you have questions about this medicine, talk to your doctor, pharmacist, or health care provider.  2013, [employer]. (10/21/2010 3:40:41 PM)   Goserelin injection What is this medicine? GOSERELIN (GOE se rel in) is similar to a hormone found in the body. It lowers the amount of sex hormones that the body makes. Men will have lower testosterone levels and women will have lower estrogen  levels while taking this medicine. In men, this medicine is used to treat prostate cancer; the injection is either given once per month or once every 12 weeks. A once per month injection (only) is used to treat women with endometriosis, dysfunctional uterine bleeding, or advanced breast cancer. This medicine may be used for other purposes; ask your health care provider or pharmacist if you have questions. What should I tell my health care provider before I take this medicine? They need to know if you have any of these conditions (some only apply to women): -diabetes -heart disease or previous heart attack -high blood pressure -high cholesterol -kidney disease -osteoporosis or low bone density -problems passing urine -spinal cord injury -stroke -tobacco smoker -an unusual or allergic reaction to goserelin, hormone therapy, other medicines, foods, dyes, or preservatives -pregnant or trying to get pregnant -breast-feeding How should I use this medicine? This medicine is for injection under the skin. It is given by a health care professional in a hospital or clinic setting. Men receive this injection once every 4 weeks or once every 12 weeks. Women will only receive the once every 4 weeks injection. Talk to your pediatrician regarding the use of this medicine in children. Special care may be needed. Overdosage: If you think you have taken too much of this medicine contact a poison control center or emergency room at once. NOTE: This medicine is only for you. Do not share this  medicine with others. What if I miss a dose? It is important not to miss your dose. Call your doctor or health care professional if you are unable to keep an appointment. What may interact with this medicine? -female hormones like estrogen -herbal or dietary supplements like black cohosh, chasteberry, or DHEA -female hormones like testosterone -prasterone This list may not describe all possible interactions. Give your  health care provider a list of all the medicines, herbs, non-prescription drugs, or dietary supplements you use. Also tell them if you smoke, drink alcohol, or use illegal drugs. Some items may interact with your medicine. What should I watch for while using this medicine? Visit your doctor or health care professional for regular checks on your progress. Your symptoms may appear to get worse during the first weeks of this therapy. Tell your doctor or healthcare professional if your symptoms do not start to get better or if they get worse after this time. Your bones may get weaker if you take this medicine for a long time. If you smoke or frequently drink alcohol you may increase your risk of bone loss. A family history of osteoporosis, chronic use of drugs for seizures (convulsions), or corticosteroids can also increase your risk of bone loss. Talk to your doctor about how to keep your bones strong. This medicine should stop regular monthly menstration in women. Tell your doctor if you continue to [facility]. Women should not become pregnant while taking this medicine or for 12 weeks after stopping this medicine. Women should inform their doctor if they wish to become pregnant or think they might be pregnant. There is a potential for serious side effects to an unborn child. Talk to your health care professional or pharmacist for more information. Do not breast-feed an infant while taking this medicine. Men should inform their doctors if they wish to father a child. This medicine may lower sperm counts. Talk to your health care professional or pharmacist for more information. What side effects may I notice from receiving this medicine? Side effects that you should report to your doctor or health care professional as soon as possible: -allergic reactions like skin rash, itching or hives, swelling of the face, lips, or tongue -bone pain -breathing problems -changes in vision -chest pain -feeling faint or  lightheaded, falls -fever, chills -pain, swelling, warmth in the leg -pain, tingling, numbness in the hands or feet -swelling of the ankles, feet, hands -trouble passing urine or change in the amount of urine -unusually high or low blood pressure -unusually weak or tired Side effects that usually do not require medical attention (report to your doctor or health care professional if they continue or are bothersome): -change in sex drive or performance -changes in breast size in both males and females -changes in emotions or moods -headache -hot flashes -irritation at site where injected -loss of appetite -skin problems like acne, dry skin -vaginal dryness This list may not describe all possible side effects. Call your doctor for medical advice about side effects. You may report side effects to FDA at 1-800-FDA-1088. Where should I keep my medicine? This drug is given in a hospital or clinic and will not be stored at home. NOTE: This sheet is a summary. It may not cover all possible information. If you have questions about this medicine, talk to your doctor, pharmacist, or health care provider.  2013, [employer]. (05/29/2008 1:28:29 PM)
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
## 2012-07-09 ENCOUNTER — Ambulatory Visit (HOSPITAL_BASED_OUTPATIENT_CLINIC_OR_DEPARTMENT_OTHER): Payer: BC Managed Care – PPO

## 2012-07-09 VITALS — BP 120/70 | HR 78 | Temp 98.0°F

## 2012-07-09 DIAGNOSIS — C50419 Malignant neoplasm of upper-outer quadrant of unspecified female breast: Secondary | ICD-10-CM

## 2012-07-09 DIAGNOSIS — C7951 Secondary malignant neoplasm of bone: Secondary | ICD-10-CM

## 2012-07-09 DIAGNOSIS — Z5189 Encounter for other specified aftercare: Secondary | ICD-10-CM

## 2012-07-09 MED ORDER — PEGFILGRASTIM INJECTION 6 MG/0.6ML
6.0000 mg | Freq: Once | SUBCUTANEOUS | Status: AC
  Administered 2012-07-09: 6 mg via SUBCUTANEOUS

## 2012-07-11 ENCOUNTER — Other Ambulatory Visit: Payer: Self-pay | Admitting: Oncology

## 2012-07-11 ENCOUNTER — Telehealth: Payer: Self-pay | Admitting: Oncology

## 2012-07-11 ENCOUNTER — Telehealth: Payer: Self-pay | Admitting: *Deleted

## 2012-07-11 NOTE — Telephone Encounter (Signed)
Per staff message and POF I have scheduled appts.  JMW  

## 2012-07-15 ENCOUNTER — Other Ambulatory Visit (HOSPITAL_BASED_OUTPATIENT_CLINIC_OR_DEPARTMENT_OTHER): Payer: BC Managed Care – PPO

## 2012-07-15 ENCOUNTER — Ambulatory Visit (HOSPITAL_BASED_OUTPATIENT_CLINIC_OR_DEPARTMENT_OTHER): Payer: BC Managed Care – PPO | Admitting: Adult Health

## 2012-07-15 ENCOUNTER — Encounter: Payer: Self-pay | Admitting: Adult Health

## 2012-07-15 VITALS — BP 111/75 | HR 106 | Temp 98.4°F | Resp 20 | Ht 63.0 in | Wt 225.8 lb

## 2012-07-15 DIAGNOSIS — C50419 Malignant neoplasm of upper-outer quadrant of unspecified female breast: Secondary | ICD-10-CM

## 2012-07-15 DIAGNOSIS — C50411 Malignant neoplasm of upper-outer quadrant of right female breast: Secondary | ICD-10-CM

## 2012-07-15 LAB — CBC WITH DIFFERENTIAL/PLATELET
BASO%: 1.7 % (ref 0.0–2.0)
EOS%: 2.1 % (ref 0.0–7.0)
HCT: 29.4 % — ABNORMAL LOW (ref 34.8–46.6)
LYMPH%: 23.8 % (ref 14.0–49.7)
MCH: 31.4 pg (ref 25.1–34.0)
MCHC: 35 g/dL (ref 31.5–36.0)
MCV: 89.6 fL (ref 79.5–101.0)
MONO#: 0.2 10*3/uL (ref 0.1–0.9)
NEUT%: 66 % (ref 38.4–76.8)
Platelets: 235 10*3/uL (ref 145–400)

## 2012-07-15 LAB — COMPREHENSIVE METABOLIC PANEL (CC13)
ALT: 26 U/L (ref 0–55)
CO2: 21 mEq/L — ABNORMAL LOW (ref 22–29)
Creatinine: 0.8 mg/dL (ref 0.6–1.1)
Total Bilirubin: 0.39 mg/dL (ref 0.20–1.20)

## 2012-07-15 NOTE — Patient Instructions (Signed)
Doing well.  Labs are stable.  Please call us if you have any questions or concerns.    
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
## 2012-07-22 ENCOUNTER — Encounter: Payer: Self-pay | Admitting: Oncology

## 2012-07-22 ENCOUNTER — Telehealth: Payer: Self-pay | Admitting: Oncology

## 2012-07-22 ENCOUNTER — Ambulatory Visit (HOSPITAL_BASED_OUTPATIENT_CLINIC_OR_DEPARTMENT_OTHER): Payer: BC Managed Care – PPO

## 2012-07-22 ENCOUNTER — Other Ambulatory Visit (HOSPITAL_BASED_OUTPATIENT_CLINIC_OR_DEPARTMENT_OTHER): Payer: BC Managed Care – PPO | Admitting: Lab

## 2012-07-22 ENCOUNTER — Ambulatory Visit (HOSPITAL_BASED_OUTPATIENT_CLINIC_OR_DEPARTMENT_OTHER): Payer: BC Managed Care – PPO | Admitting: Adult Health

## 2012-07-22 ENCOUNTER — Encounter: Payer: Self-pay | Admitting: Adult Health

## 2012-07-22 ENCOUNTER — Telehealth: Payer: Self-pay | Admitting: *Deleted

## 2012-07-22 VITALS — BP 161/82 | HR 100 | Temp 98.2°F | Resp 20 | Ht 63.0 in | Wt 229.2 lb

## 2012-07-22 VITALS — BP 132/77 | HR 81 | Temp 97.9°F | Resp 18

## 2012-07-22 DIAGNOSIS — C7951 Secondary malignant neoplasm of bone: Secondary | ICD-10-CM

## 2012-07-22 DIAGNOSIS — C773 Secondary and unspecified malignant neoplasm of axilla and upper limb lymph nodes: Secondary | ICD-10-CM

## 2012-07-22 DIAGNOSIS — C50419 Malignant neoplasm of upper-outer quadrant of unspecified female breast: Secondary | ICD-10-CM

## 2012-07-22 DIAGNOSIS — C50411 Malignant neoplasm of upper-outer quadrant of right female breast: Secondary | ICD-10-CM

## 2012-07-22 DIAGNOSIS — Z5111 Encounter for antineoplastic chemotherapy: Secondary | ICD-10-CM

## 2012-07-22 DIAGNOSIS — Z170 Estrogen receptor positive status [ER+]: Secondary | ICD-10-CM

## 2012-07-22 LAB — CBC WITH DIFFERENTIAL/PLATELET
BASO%: 0.9 % (ref 0.0–2.0)
Basophils Absolute: 0.2 10*3/uL — ABNORMAL HIGH (ref 0.0–0.1)
Eosinophils Absolute: 0 10*3/uL (ref 0.0–0.5)
HCT: 31 % — ABNORMAL LOW (ref 34.8–46.6)
LYMPH%: 9.4 % — ABNORMAL LOW (ref 14.0–49.7)
MCHC: 33.5 g/dL (ref 31.5–36.0)
MONO#: 1 10*3/uL — ABNORMAL HIGH (ref 0.1–0.9)
NEUT%: 84 % — ABNORMAL HIGH (ref 38.4–76.8)
Platelets: 171 10*3/uL (ref 145–400)
WBC: 17.6 10*3/uL — ABNORMAL HIGH (ref 3.9–10.3)

## 2012-07-22 LAB — COMPREHENSIVE METABOLIC PANEL (CC13)
BUN: 11.7 mg/dL (ref 7.0–26.0)
CO2: 23 mEq/L (ref 22–29)
Calcium: 8.9 mg/dL (ref 8.4–10.4)
Chloride: 109 mEq/L (ref 98–109)
Creatinine: 0.8 mg/dL (ref 0.6–1.1)
Glucose: 114 mg/dl (ref 70–140)

## 2012-07-22 MED ORDER — DEXAMETHASONE SODIUM PHOSPHATE 20 MG/5ML IJ SOLN
20.0000 mg | Freq: Once | INTRAMUSCULAR | Status: AC
  Administered 2012-07-22: 20 mg via INTRAVENOUS

## 2012-07-22 MED ORDER — SODIUM CHLORIDE 0.9 % IV SOLN
Freq: Once | INTRAVENOUS | Status: AC
  Administered 2012-07-22: 15:00:00 via INTRAVENOUS

## 2012-07-22 MED ORDER — ONDANSETRON 8 MG/50ML IVPB (CHCC)
8.0000 mg | Freq: Once | INTRAVENOUS | Status: AC
  Administered 2012-07-22: 8 mg via INTRAVENOUS

## 2012-07-22 MED ORDER — HEPARIN SOD (PORK) LOCK FLUSH 100 UNIT/ML IV SOLN
500.0000 [IU] | Freq: Once | INTRAVENOUS | Status: AC | PRN
  Administered 2012-07-22: 500 [IU]
  Filled 2012-07-22: qty 5

## 2012-07-22 MED ORDER — FAMOTIDINE IN NACL 20-0.9 MG/50ML-% IV SOLN
20.0000 mg | Freq: Once | INTRAVENOUS | Status: AC
  Administered 2012-07-22: 20 mg via INTRAVENOUS

## 2012-07-22 MED ORDER — DIPHENHYDRAMINE HCL 50 MG/ML IJ SOLN
50.0000 mg | Freq: Once | INTRAMUSCULAR | Status: AC
  Administered 2012-07-22: 50 mg via INTRAVENOUS

## 2012-07-22 MED ORDER — SODIUM CHLORIDE 0.9 % IJ SOLN
10.0000 mL | INTRAMUSCULAR | Status: DC | PRN
  Administered 2012-07-22: 10 mL
  Filled 2012-07-22: qty 10

## 2012-07-22 MED ORDER — SODIUM CHLORIDE 0.9 % IV SOLN
80.0000 mg/m2 | Freq: Once | INTRAVENOUS | Status: AC
  Administered 2012-07-22: 168 mg via INTRAVENOUS
  Filled 2012-07-22: qty 28

## 2012-07-22 NOTE — Telephone Encounter (Signed)
Per staff message and POF I have scheduled appts.  JMW  
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
## 2012-07-22 NOTE — Patient Instructions (Addendum)
[facility] Discharge Instructions for Patients Receiving Chemotherapy  Today you received the following chemotherapy agents Taxol To help prevent nausea and vomiting after your treatment, we encourage you to take your nausea medication as prescribed.  If you develop nausea and vomiting that is not controlled by your nausea medication, call the clinic.   BELOW ARE SYMPTOMS THAT SHOULD BE REPORTED IMMEDIATELY:  *FEVER GREATER THAN 100.5 F  *CHILLS WITH OR WITHOUT FEVER  NAUSEA AND VOMITING THAT IS NOT CONTROLLED WITH YOUR NAUSEA MEDICATION  *UNUSUAL SHORTNESS OF BREATH  *UNUSUAL BRUISING OR BLEEDING  TENDERNESS IN MOUTH AND THROAT WITH OR WITHOUT PRESENCE OF ULCERS  *URINARY PROBLEMS  *BOWEL PROBLEMS  UNUSUAL RASH Items with * indicate a potential emergency and should be followed up as soon as possible.  Feel free to call the clinic you have any questions or concerns. The clinic phone number is [phone].      Paclitaxel injection What is this medicine? PACLITAXEL (PAK li TAX el) is a chemotherapy drug. It targets fast dividing cells, like cancer cells, and causes these cells to die. This medicine is used to treat ovarian cancer, breast cancer, and other cancers. This medicine may be used for other purposes; ask your health care provider or pharmacist if you have questions. What should I tell my health care provider before I take this medicine? They need to know if you have any of these conditions: -blood disorders -irregular heartbeat -infection (especially a virus infection such as chickenpox, cold sores, or herpes) -liver disease -previous or ongoing radiation therapy -an unusual or allergic reaction to paclitaxel, alcohol, polyoxyethylated castor oil, other chemotherapy agents, other medicines, foods, dyes, or preservatives -pregnant or trying to get pregnant -breast-feeding How should I use this medicine? This drug is given as an infusion into a  vein. It is administered in a hospital or clinic by a specially trained health care professional. Talk to your pediatrician regarding the use of this medicine in children. Special care may be needed. Overdosage: If you think you have taken too much of this medicine contact a poison control center or emergency room at once. NOTE: This medicine is only for you. Do not share this medicine with others. What if I miss a dose? It is important not to miss your dose. Call your doctor or health care professional if you are unable to keep an appointment. What may interact with this medicine? Do not take this medicine with any of the following medications: -disulfiram -metronidazole This medicine may also interact with the following medications: -cyclosporine -dexamethasone -diazepam -ketoconazole -medicines to increase blood counts like filgrastim, pegfilgrastim, sargramostim -other chemotherapy drugs like cisplatin, doxorubicin, epirubicin, etoposide, teniposide, vincristine -quinidine -testosterone -vaccines -verapamil Talk to your doctor or health care professional before taking any of these medicines: -acetaminophen -aspirin -ibuprofen -ketoprofen -naproxen This list may not describe all possible interactions. Give your health care provider a list of all the medicines, herbs, non-prescription drugs, or dietary supplements you use. Also tell them if you smoke, drink alcohol, or use illegal drugs. Some items may interact with your medicine. What should I watch for while using this medicine? Your condition will be monitored carefully while you are receiving this medicine. You will need important blood work done while you are taking this medicine. This drug may make you feel generally unwell. This is not uncommon, as chemotherapy can affect healthy cells as well as cancer cells. Report any side effects. Continue your course of treatment even though you  feel ill unless your doctor tells you to  stop. In some cases, you may be given additional medicines to help with side effects. Follow all directions for their use. Call your doctor or health care professional for advice if you get a fever, chills or sore throat, or other symptoms of a cold or flu. Do not treat yourself. This drug decreases your body's ability to fight infections. Try to avoid being around people who are sick. This medicine may increase your risk to bruise or bleed. Call your doctor or health care professional if you notice any unusual bleeding. Be careful brushing and flossing your teeth or using a toothpick because you may get an infection or bleed more easily. If you have any dental work done, tell your dentist you are receiving this medicine. Avoid taking products that contain aspirin, acetaminophen, ibuprofen, naproxen, or ketoprofen unless instructed by your doctor. These medicines may hide a fever. Do not become pregnant while taking this medicine. Women should inform their doctor if they wish to become pregnant or think they might be pregnant. There is a potential for serious side effects to an unborn child. Talk to your health care professional or pharmacist for more information. Do not breast-feed an infant while taking this medicine. Men are advised not to father a child while receiving this medicine. What side effects may I notice from receiving this medicine? Side effects that you should report to your doctor or health care professional as soon as possible: -allergic reactions like skin rash, itching or hives, swelling of the face, lips, or tongue -low blood counts - This drug may decrease the number of white blood cells, red blood cells and platelets. You may be at increased risk for infections and bleeding. -signs of infection - fever or chills, cough, sore throat, pain or difficulty passing urine -signs of decreased platelets or bleeding - bruising, pinpoint red spots on the skin, black, tarry stools,  nosebleeds -signs of decreased red blood cells - unusually weak or tired, fainting spells, lightheadedness -breathing problems -chest pain -high or low blood pressure -mouth sores -nausea and vomiting -pain, swelling, redness or irritation at the injection site -pain, tingling, numbness in the hands or feet -slow or irregular heartbeat -swelling of the ankle, feet, hands Side effects that usually do not require medical attention (report to your doctor or health care professional if they continue or are bothersome): -bone pain -complete hair loss including hair on your head, underarms, pubic hair, eyebrows, and eyelashes -changes in the color of fingernails -diarrhea -loosening of the fingernails -loss of appetite -muscle or joint pain -red flush to skin -sweating This list may not describe all possible side effects. Call your doctor for medical advice about side effects. You may report side effects to FDA at 1-800-FDA-1088. Where should I keep my medicine? This drug is given in a hospital or clinic and will not be stored at home. NOTE: This sheet is a summary. It may not cover all possible information. If you have questions about this medicine, talk to your doctor, pharmacist, or health care provider.  2013, [employer]. (12/26/2007 11:54:26 AM)
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
## 2012-07-28 ENCOUNTER — Encounter: Payer: Self-pay | Admitting: Adult Health

## 2012-07-28 ENCOUNTER — Other Ambulatory Visit: Payer: BC Managed Care – PPO | Admitting: Lab

## 2012-07-28 ENCOUNTER — Other Ambulatory Visit (HOSPITAL_BASED_OUTPATIENT_CLINIC_OR_DEPARTMENT_OTHER): Payer: BC Managed Care – PPO | Admitting: Lab

## 2012-07-28 ENCOUNTER — Encounter: Payer: Self-pay | Admitting: Oncology

## 2012-07-28 ENCOUNTER — Ambulatory Visit (HOSPITAL_BASED_OUTPATIENT_CLINIC_OR_DEPARTMENT_OTHER): Payer: BC Managed Care – PPO

## 2012-07-28 ENCOUNTER — Ambulatory Visit (HOSPITAL_BASED_OUTPATIENT_CLINIC_OR_DEPARTMENT_OTHER): Payer: BC Managed Care – PPO | Admitting: Adult Health

## 2012-07-28 VITALS — BP 138/70 | HR 101 | Temp 98.3°F | Resp 20 | Ht 63.0 in | Wt 234.0 lb

## 2012-07-28 DIAGNOSIS — C7952 Secondary malignant neoplasm of bone marrow: Secondary | ICD-10-CM

## 2012-07-28 DIAGNOSIS — Z170 Estrogen receptor positive status [ER+]: Secondary | ICD-10-CM

## 2012-07-28 DIAGNOSIS — C773 Secondary and unspecified malignant neoplasm of axilla and upper limb lymph nodes: Secondary | ICD-10-CM

## 2012-07-28 DIAGNOSIS — C7951 Secondary malignant neoplasm of bone: Secondary | ICD-10-CM

## 2012-07-28 DIAGNOSIS — Z5111 Encounter for antineoplastic chemotherapy: Secondary | ICD-10-CM

## 2012-07-28 DIAGNOSIS — C50419 Malignant neoplasm of upper-outer quadrant of unspecified female breast: Secondary | ICD-10-CM

## 2012-07-28 DIAGNOSIS — C50411 Malignant neoplasm of upper-outer quadrant of right female breast: Secondary | ICD-10-CM

## 2012-07-28 LAB — COMPREHENSIVE METABOLIC PANEL (CC13)
ALT: 26 U/L (ref 0–55)
Albumin: 3.2 g/dL — ABNORMAL LOW (ref 3.5–5.0)
CO2: 23 mEq/L (ref 22–29)
Calcium: 8.6 mg/dL (ref 8.4–10.4)
Chloride: 107 mEq/L (ref 98–109)
Creatinine: 0.8 mg/dL (ref 0.6–1.1)
Potassium: 4.3 mEq/L (ref 3.5–5.1)

## 2012-07-28 LAB — CBC WITH DIFFERENTIAL/PLATELET
BASO%: 0.6 % (ref 0.0–2.0)
Basophils Absolute: 0.1 10*3/uL (ref 0.0–0.1)
HCT: 30 % — ABNORMAL LOW (ref 34.8–46.6)
HGB: 10.3 g/dL — ABNORMAL LOW (ref 11.6–15.9)
MCHC: 34.3 g/dL (ref 31.5–36.0)
MONO#: 0.9 10*3/uL (ref 0.1–0.9)
NEUT%: 77.7 % — ABNORMAL HIGH (ref 38.4–76.8)
WBC: 13.6 10*3/uL — ABNORMAL HIGH (ref 3.9–10.3)
lymph#: 2 10*3/uL (ref 0.9–3.3)

## 2012-07-28 MED ORDER — FAMOTIDINE IN NACL 20-0.9 MG/50ML-% IV SOLN
20.0000 mg | Freq: Once | INTRAVENOUS | Status: AC
  Administered 2012-07-28: 20 mg via INTRAVENOUS

## 2012-07-28 MED ORDER — ONDANSETRON 8 MG/50ML IVPB (CHCC)
8.0000 mg | Freq: Once | INTRAVENOUS | Status: AC
  Administered 2012-07-28: 8 mg via INTRAVENOUS

## 2012-07-28 MED ORDER — SODIUM CHLORIDE 0.9 % IV SOLN
Freq: Once | INTRAVENOUS | Status: AC
  Administered 2012-07-28: 14:00:00 via INTRAVENOUS

## 2012-07-28 MED ORDER — SODIUM CHLORIDE 0.9 % IV SOLN
80.0000 mg/m2 | Freq: Once | INTRAVENOUS | Status: AC
  Administered 2012-07-28: 168 mg via INTRAVENOUS
  Filled 2012-07-28: qty 28

## 2012-07-28 MED ORDER — DIPHENHYDRAMINE HCL 50 MG/ML IJ SOLN
50.0000 mg | Freq: Once | INTRAMUSCULAR | Status: AC
  Administered 2012-07-28: 50 mg via INTRAVENOUS

## 2012-07-28 MED ORDER — SODIUM CHLORIDE 0.9 % IJ SOLN
10.0000 mL | INTRAMUSCULAR | Status: DC | PRN
  Administered 2012-07-28: 10 mL
  Filled 2012-07-28: qty 10

## 2012-07-28 MED ORDER — DEXAMETHASONE SODIUM PHOSPHATE 20 MG/5ML IJ SOLN
20.0000 mg | Freq: Once | INTRAMUSCULAR | Status: AC
  Administered 2012-07-28: 20 mg via INTRAVENOUS

## 2012-07-28 MED ORDER — HEPARIN SOD (PORK) LOCK FLUSH 100 UNIT/ML IV SOLN
500.0000 [IU] | Freq: Once | INTRAVENOUS | Status: AC | PRN
  Administered 2012-07-28: 500 [IU]
  Filled 2012-07-28: qty 5

## 2012-07-28 NOTE — Patient Instructions (Addendum)
[facility] Discharge Instructions for Patients Receiving Chemotherapy  Today you received the following chemotherapy agents:  Taxol  To help prevent nausea and vomiting after your treatment, we encourage you to take your nausea medication as ordered per MD.   If you develop nausea and vomiting that is not controlled by your nausea medication, call the clinic.   BELOW ARE SYMPTOMS THAT SHOULD BE REPORTED IMMEDIATELY:  *FEVER GREATER THAN 100.5 F  *CHILLS WITH OR WITHOUT FEVER  NAUSEA AND VOMITING THAT IS NOT CONTROLLED WITH YOUR NAUSEA MEDICATION  *UNUSUAL SHORTNESS OF BREATH  *UNUSUAL BRUISING OR BLEEDING  TENDERNESS IN MOUTH AND THROAT WITH OR WITHOUT PRESENCE OF ULCERS  *URINARY PROBLEMS  *BOWEL PROBLEMS  UNUSUAL RASH Items with * indicate a potential emergency and should be followed up as soon as possible.  Feel free to call the clinic you have any questions or concerns. The clinic phone number is [phone].    

## 2012-07-28 NOTE — Patient Instructions (Addendum)
Doing well.  Proceed with chemotherapy.  Please call us if you have any questions or concerns.    
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
## 2012-08-04 ENCOUNTER — Other Ambulatory Visit: Payer: Self-pay | Admitting: *Deleted

## 2012-08-04 DIAGNOSIS — C50411 Malignant neoplasm of upper-outer quadrant of right female breast: Secondary | ICD-10-CM

## 2012-08-04 MED ORDER — DEXAMETHASONE 4 MG PO TABS: ORAL_TABLET | ORAL | Status: DC

## 2012-08-05 ENCOUNTER — Ambulatory Visit: Payer: BC Managed Care – PPO

## 2012-08-05 ENCOUNTER — Ambulatory Visit (HOSPITAL_BASED_OUTPATIENT_CLINIC_OR_DEPARTMENT_OTHER): Payer: BC Managed Care – PPO

## 2012-08-05 ENCOUNTER — Other Ambulatory Visit: Payer: BC Managed Care – PPO | Admitting: Lab

## 2012-08-05 ENCOUNTER — Encounter: Payer: Self-pay | Admitting: Oncology

## 2012-08-05 ENCOUNTER — Ambulatory Visit (HOSPITAL_BASED_OUTPATIENT_CLINIC_OR_DEPARTMENT_OTHER): Payer: BC Managed Care – PPO | Admitting: Adult Health

## 2012-08-05 ENCOUNTER — Other Ambulatory Visit (HOSPITAL_BASED_OUTPATIENT_CLINIC_OR_DEPARTMENT_OTHER): Payer: BC Managed Care – PPO | Admitting: Lab

## 2012-08-05 ENCOUNTER — Encounter: Payer: Self-pay | Admitting: Adult Health

## 2012-08-05 VITALS — BP 118/74 | HR 112 | Temp 98.3°F | Resp 20 | Ht 63.0 in | Wt 237.6 lb

## 2012-08-05 DIAGNOSIS — C50419 Malignant neoplasm of upper-outer quadrant of unspecified female breast: Secondary | ICD-10-CM

## 2012-08-05 DIAGNOSIS — C7951 Secondary malignant neoplasm of bone: Secondary | ICD-10-CM

## 2012-08-05 DIAGNOSIS — F411 Generalized anxiety disorder: Secondary | ICD-10-CM

## 2012-08-05 DIAGNOSIS — G609 Hereditary and idiopathic neuropathy, unspecified: Secondary | ICD-10-CM

## 2012-08-05 DIAGNOSIS — C773 Secondary and unspecified malignant neoplasm of axilla and upper limb lymph nodes: Secondary | ICD-10-CM

## 2012-08-05 DIAGNOSIS — Z5111 Encounter for antineoplastic chemotherapy: Secondary | ICD-10-CM

## 2012-08-05 DIAGNOSIS — Z170 Estrogen receptor positive status [ER+]: Secondary | ICD-10-CM

## 2012-08-05 DIAGNOSIS — C50411 Malignant neoplasm of upper-outer quadrant of right female breast: Secondary | ICD-10-CM

## 2012-08-05 DIAGNOSIS — G4700 Insomnia, unspecified: Secondary | ICD-10-CM

## 2012-08-05 LAB — CBC WITH DIFFERENTIAL/PLATELET
BASO%: 0.3 % (ref 0.0–2.0)
EOS%: 1.3 % (ref 0.0–7.0)
HCT: 33 % — ABNORMAL LOW (ref 34.8–46.6)
LYMPH%: 20.3 % (ref 14.0–49.7)
MCH: 31.8 pg (ref 25.1–34.0)
MCHC: 32.4 g/dL (ref 31.5–36.0)
MCV: 98.2 fL (ref 79.5–101.0)
MONO#: 0.8 10*3/uL (ref 0.1–0.9)
MONO%: 10.4 % (ref 0.0–14.0)
NEUT%: 67.7 % (ref 38.4–76.8)
Platelets: 245 10*3/uL (ref 145–400)
RBC: 3.36 10*6/uL — ABNORMAL LOW (ref 3.70–5.45)

## 2012-08-05 LAB — COMPREHENSIVE METABOLIC PANEL (CC13)
ALT: 34 U/L (ref 0–55)
Alkaline Phosphatase: 50 U/L (ref 40–150)
CO2: 22 mEq/L (ref 22–29)
Creatinine: 0.9 mg/dL (ref 0.6–1.1)
Total Bilirubin: 0.29 mg/dL (ref 0.20–1.20)

## 2012-08-05 MED ORDER — SODIUM CHLORIDE 0.9 % IV SOLN
80.0000 mg/m2 | Freq: Once | INTRAVENOUS | Status: AC
  Administered 2012-08-05: 168 mg via INTRAVENOUS
  Filled 2012-08-05: qty 28

## 2012-08-05 MED ORDER — SODIUM CHLORIDE 0.9 % IJ SOLN
10.0000 mL | INTRAMUSCULAR | Status: DC | PRN
  Administered 2012-08-05: 10 mL
  Filled 2012-08-05: qty 10

## 2012-08-05 MED ORDER — DENOSUMAB 120 MG/1.7ML ~~LOC~~ SOLN
120.0000 mg | Freq: Once | SUBCUTANEOUS | Status: AC
  Administered 2012-08-05: 120 mg via SUBCUTANEOUS
  Filled 2012-08-05: qty 1.7

## 2012-08-05 MED ORDER — DEXAMETHASONE SODIUM PHOSPHATE 20 MG/5ML IJ SOLN
20.0000 mg | Freq: Once | INTRAMUSCULAR | Status: AC
  Administered 2012-08-05: 20 mg via INTRAVENOUS

## 2012-08-05 MED ORDER — HEPARIN SOD (PORK) LOCK FLUSH 100 UNIT/ML IV SOLN
500.0000 [IU] | Freq: Once | INTRAVENOUS | Status: AC | PRN
  Administered 2012-08-05: 500 [IU]
  Filled 2012-08-05: qty 5

## 2012-08-05 MED ORDER — RANITIDINE HCL 50 MG/2ML IJ SOLN
50.0000 mg | Freq: Once | INTRAVENOUS | Status: AC
  Administered 2012-08-05: 50 mg via INTRAVENOUS
  Filled 2012-08-05: qty 2

## 2012-08-05 MED ORDER — ONDANSETRON 8 MG/50ML IVPB (CHCC)
8.0000 mg | Freq: Once | INTRAVENOUS | Status: AC
  Administered 2012-08-05: 8 mg via INTRAVENOUS

## 2012-08-05 MED ORDER — FAMOTIDINE IN NACL 20-0.9 MG/50ML-% IV SOLN: 20.0000 mg | Freq: Once | INTRAVENOUS | Status: DC

## 2012-08-05 MED ORDER — DIPHENHYDRAMINE HCL 50 MG/ML IJ SOLN
50.0000 mg | Freq: Once | INTRAMUSCULAR | Status: AC
  Administered 2012-08-05: 50 mg via INTRAVENOUS

## 2012-08-05 MED ORDER — GOSERELIN ACETATE 3.6 MG ~~LOC~~ IMPL
3.6000 mg | DRUG_IMPLANT | SUBCUTANEOUS | Status: DC
  Administered 2012-08-05: 3.6 mg via SUBCUTANEOUS
  Filled 2012-08-05: qty 3.6

## 2012-08-05 MED ORDER — SODIUM CHLORIDE 0.9 % IV SOLN
Freq: Once | INTRAVENOUS | Status: AC
  Administered 2012-08-05: 11:00:00 via INTRAVENOUS
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
## 2012-08-05 NOTE — Patient Instructions (Addendum)
[facility] Discharge Instructions for Patients Receiving Chemotherapy  Today you received the following chemotherapy agents:  Taxol  To help prevent nausea and vomiting after your treatment, we encourage you to take your nausea medication as ordered per MD.   If you develop nausea and vomiting that is not controlled by your nausea medication, call the clinic.   BELOW ARE SYMPTOMS THAT SHOULD BE REPORTED IMMEDIATELY:  *FEVER GREATER THAN 100.5 F  *CHILLS WITH OR WITHOUT FEVER  NAUSEA AND VOMITING THAT IS NOT CONTROLLED WITH YOUR NAUSEA MEDICATION  *UNUSUAL SHORTNESS OF BREATH  *UNUSUAL BRUISING OR BLEEDING  TENDERNESS IN MOUTH AND THROAT WITH OR WITHOUT PRESENCE OF ULCERS  *URINARY PROBLEMS  *BOWEL PROBLEMS  UNUSUAL RASH Items with * indicate a potential emergency and should be followed up as soon as possible.  Feel free to call the clinic you have any questions or concerns. The clinic phone number is [phone].    

## 2012-08-05 NOTE — Progress Notes (Signed)
Injections given while in infusion room

## 2012-08-12 ENCOUNTER — Encounter: Payer: Self-pay | Admitting: Adult Health

## 2012-08-12 ENCOUNTER — Ambulatory Visit (HOSPITAL_BASED_OUTPATIENT_CLINIC_OR_DEPARTMENT_OTHER): Payer: BC Managed Care – PPO | Admitting: Adult Health

## 2012-08-12 ENCOUNTER — Ambulatory Visit (HOSPITAL_BASED_OUTPATIENT_CLINIC_OR_DEPARTMENT_OTHER): Payer: BC Managed Care – PPO

## 2012-08-12 ENCOUNTER — Encounter: Payer: Self-pay | Admitting: Oncology

## 2012-08-12 ENCOUNTER — Other Ambulatory Visit (HOSPITAL_BASED_OUTPATIENT_CLINIC_OR_DEPARTMENT_OTHER): Payer: BC Managed Care – PPO | Admitting: Lab

## 2012-08-12 ENCOUNTER — Other Ambulatory Visit: Payer: BC Managed Care – PPO | Admitting: Lab

## 2012-08-12 ENCOUNTER — Other Ambulatory Visit: Payer: Self-pay | Admitting: Oncology

## 2012-08-12 ENCOUNTER — Ambulatory Visit: Payer: BC Managed Care – PPO

## 2012-08-12 VITALS — BP 123/80 | HR 86 | Temp 97.8°F | Resp 20 | Ht 63.0 in | Wt 236.2 lb

## 2012-08-12 DIAGNOSIS — Z170 Estrogen receptor positive status [ER+]: Secondary | ICD-10-CM

## 2012-08-12 DIAGNOSIS — C50411 Malignant neoplasm of upper-outer quadrant of right female breast: Secondary | ICD-10-CM

## 2012-08-12 DIAGNOSIS — Z5111 Encounter for antineoplastic chemotherapy: Secondary | ICD-10-CM

## 2012-08-12 DIAGNOSIS — C7951 Secondary malignant neoplasm of bone: Secondary | ICD-10-CM

## 2012-08-12 DIAGNOSIS — C50419 Malignant neoplasm of upper-outer quadrant of unspecified female breast: Secondary | ICD-10-CM

## 2012-08-12 DIAGNOSIS — G629 Polyneuropathy, unspecified: Secondary | ICD-10-CM

## 2012-08-12 DIAGNOSIS — C7952 Secondary malignant neoplasm of bone marrow: Secondary | ICD-10-CM

## 2012-08-12 LAB — COMPREHENSIVE METABOLIC PANEL (CC13)
ALT: 43 U/L (ref 0–55)
Albumin: 3.2 g/dL — ABNORMAL LOW (ref 3.5–5.0)
CO2: 24 mEq/L (ref 22–29)
Calcium: 9.9 mg/dL (ref 8.4–10.4)
Chloride: 102 mEq/L (ref 98–109)
Glucose: 86 mg/dl (ref 70–140)
Sodium: 136 mEq/L (ref 136–145)
Total Protein: 6.1 g/dL — ABNORMAL LOW (ref 6.4–8.3)

## 2012-08-12 LAB — CBC WITH DIFFERENTIAL/PLATELET
BASO%: 0.2 % (ref 0.0–2.0)
Eosinophils Absolute: 0.2 10*3/uL (ref 0.0–0.5)
HCT: 32.8 % — ABNORMAL LOW (ref 34.8–46.6)
MCHC: 33.5 g/dL (ref 31.5–36.0)
MONO#: 0.7 10*3/uL (ref 0.1–0.9)
NEUT#: 6.9 10*3/uL — ABNORMAL HIGH (ref 1.5–6.5)
Platelets: 237 10*3/uL (ref 145–400)
RBC: 3.39 10*6/uL — ABNORMAL LOW (ref 3.70–5.45)
WBC: 9.3 10*3/uL (ref 3.9–10.3)
lymph#: 1.5 10*3/uL (ref 0.9–3.3)

## 2012-08-12 LAB — TECHNOLOGIST REVIEW

## 2012-08-12 MED ORDER — SODIUM CHLORIDE 0.9 % IJ SOLN
10.0000 mL | INTRAMUSCULAR | Status: DC | PRN
  Administered 2012-08-12: 10 mL
  Filled 2012-08-12: qty 10

## 2012-08-12 MED ORDER — FAMOTIDINE IN NACL 20-0.9 MG/50ML-% IV SOLN: 20.0000 mg | Freq: Once | INTRAVENOUS | Status: DC

## 2012-08-12 MED ORDER — SODIUM CHLORIDE 0.9 % IV SOLN
Freq: Once | INTRAVENOUS | Status: AC
  Administered 2012-08-12: 12:00:00 via INTRAVENOUS

## 2012-08-12 MED ORDER — SODIUM CHLORIDE 0.9 % IV SOLN
80.0000 mg/m2 | Freq: Once | INTRAVENOUS | Status: AC
  Administered 2012-08-12: 168 mg via INTRAVENOUS
  Filled 2012-08-12: qty 28

## 2012-08-12 MED ORDER — SODIUM CHLORIDE 0.9 % IV SOLN
50.0000 mg | Freq: Once | INTRAVENOUS | Status: AC
  Administered 2012-08-12: 50 mg via INTRAVENOUS
  Filled 2012-08-12: qty 2

## 2012-08-12 MED ORDER — GABAPENTIN 100 MG PO CAPS: 100.0000 mg | ORAL_CAPSULE | Freq: Three times a day (TID) | ORAL | Status: DC

## 2012-08-12 MED ORDER — DIPHENHYDRAMINE HCL 50 MG/ML IJ SOLN
50.0000 mg | Freq: Once | INTRAMUSCULAR | Status: AC
  Administered 2012-08-12: 50 mg via INTRAVENOUS

## 2012-08-12 MED ORDER — ONDANSETRON 8 MG/50ML IVPB (CHCC)
8.0000 mg | Freq: Once | INTRAVENOUS | Status: AC
  Administered 2012-08-12: 8 mg via INTRAVENOUS

## 2012-08-12 MED ORDER — HEPARIN SOD (PORK) LOCK FLUSH 100 UNIT/ML IV SOLN
500.0000 [IU] | Freq: Once | INTRAVENOUS | Status: AC | PRN
  Administered 2012-08-12: 500 [IU]
  Filled 2012-08-12: qty 5

## 2012-08-12 MED ORDER — DEXAMETHASONE SODIUM PHOSPHATE 20 MG/5ML IJ SOLN
20.0000 mg | Freq: Once | INTRAMUSCULAR | Status: AC
  Administered 2012-08-12: 20 mg via INTRAVENOUS

## 2012-08-12 NOTE — Patient Instructions (Addendum)
[facility] Discharge Instructions for Patients Receiving Chemotherapy  Today you received the following chemotherapy agents: Taxol  To help prevent nausea and vomiting after your treatment, we encourage you to take your nausea medication as directed by your physician.    If you develop nausea and vomiting that is not controlled by your nausea medication, call the clinic.   BELOW ARE SYMPTOMS THAT SHOULD BE REPORTED IMMEDIATELY:  *FEVER GREATER THAN 100.5 F  *CHILLS WITH OR WITHOUT FEVER  NAUSEA AND VOMITING THAT IS NOT CONTROLLED WITH YOUR NAUSEA MEDICATION  *UNUSUAL SHORTNESS OF BREATH  *UNUSUAL BRUISING OR BLEEDING  TENDERNESS IN MOUTH AND THROAT WITH OR WITHOUT PRESENCE OF ULCERS  *URINARY PROBLEMS  *BOWEL PROBLEMS  UNUSUAL RASH Items with * indicate a potential emergency and should be followed up as soon as possible.  Feel free to call the clinic you have any questions or concerns. The clinic phone number is [phone].    

## 2012-08-12 NOTE — Patient Instructions (Signed)
Gabapentin capsules or tablets What is this medicine? GABAPENTIN (GA ba pen tin) is used to control partial seizures in adults with epilepsy. It is also used to treat certain types of nerve pain. This medicine may be used for other purposes; ask your health care provider or pharmacist if you have questions. What should I tell my health care provider before I take this medicine? They need to know if you have any of these conditions: -kidney disease -suicidal thoughts, plans, or attempt; a previous suicide attempt by you or a family member -an unusual or allergic reaction to gabapentin, other medicines, foods, dyes, or preservatives -pregnant or trying to get pregnant -breast-feeding How should I use this medicine? Take this medicine by mouth. Swallow it with a drink of water. Follow the directions on the prescription label. If this medicine upsets your stomach, take it with food or milk. Take your medicine at regular intervals. Do not take it more often than directed. If you are directed to break the 600 or 800 mg tablets in half as part of your dose, the extra half tablet should be used for the next dose. If you have not used the extra half tablet within 3 days, it should be thrown away. A special MedGuide will be given to you by the pharmacist with each prescription and refill. Be sure to read this information carefully each time. Talk to your pediatrician regarding the use of this medicine in children. Special care may be needed. Overdosage: If you think you have taken too much of this medicine contact a poison control center or emergency room at once. NOTE: This medicine is only for you. Do not share this medicine with others. What if I miss a dose? If you miss a dose, take it as soon as you can. If it is almost time for your next dose, take only that dose. Do not take double or extra doses. What may interact with this medicine? -antacids -hydrocodone -morphine -naproxen -sevelamer This  list may not describe all possible interactions. Give your health care provider a list of all the medicines, herbs, non-prescription drugs, or dietary supplements you use. Also tell them if you smoke, drink alcohol, or use illegal drugs. Some items may interact with your medicine. What should I watch for while using this medicine? Visit your doctor or health care professional for regular checks on your progress. You may want to keep a record at home of how you feel your condition is responding to treatment. You may want to share this information with your doctor or health care professional at each visit. You should contact your doctor or health care professional if your seizures get worse or if you have any new types of seizures. Do not stop taking this medicine or any of your seizure medicines unless instructed by your doctor or health care professional. Stopping your medicine suddenly can increase your seizures or their severity. Wear a medical identification bracelet or chain if you are taking this medicine for seizures, and carry a card that lists all your medications. You may get drowsy, dizzy, or have blurred vision. Do not drive, use machinery, or do anything that needs mental alertness until you know how this medicine affects you. To reduce dizzy or fainting spells, do not sit or stand up quickly, especially if you are an older patient. Alcohol can increase drowsiness and dizziness. Avoid alcoholic drinks. Your mouth may get dry. Chewing sugarless gum or sucking hard candy, and drinking plenty of water will help.   The use of this medicine may increase the chance of suicidal thoughts or actions. Pay special attention to how you are responding while on this medicine. Any worsening of mood, or thoughts of suicide or dying should be reported to your health care professional right away. Women who become pregnant while using this medicine may enroll in the [place] Antiepileptic Drug Pregnancy Registry  by calling 1-888-233-2334. This registry collects information about the safety of antiepileptic drug use during pregnancy. What side effects may I notice from receiving this medicine? Side effects that you should report to your doctor or health care professional as soon as possible: -allergic reactions like skin rash, itching or hives, swelling of the face, lips, or tongue -worsening of mood, thoughts or actions of suicide or dying Side effects that usually do not require medical attention (report to your doctor or health care professional if they continue or are bothersome): -constipation -difficulty walking or controlling muscle movements -nausea -slurred speech -tremors -weight gain This list may not describe all possible side effects. Call your doctor for medical advice about side effects. You may report side effects to FDA at 1-800-FDA-1088. Where should I keep my medicine? Keep out of reach of children. Store at room temperature between 15 and 30 degrees C (59 and 86 degrees F). Throw away any unused medicine after the expiration date. NOTE: This sheet is a summary. It may not cover all possible information. If you have questions about this medicine, talk to your doctor, pharmacist, or health care provider.  2012, [employer]. (09/10/2009 6:06:26 PM) 
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
## 2012-08-19 ENCOUNTER — Telehealth: Payer: Self-pay | Admitting: *Deleted

## 2012-08-19 ENCOUNTER — Ambulatory Visit (HOSPITAL_BASED_OUTPATIENT_CLINIC_OR_DEPARTMENT_OTHER): Payer: BC Managed Care – PPO

## 2012-08-19 ENCOUNTER — Other Ambulatory Visit: Payer: BC Managed Care – PPO | Admitting: Lab

## 2012-08-19 ENCOUNTER — Other Ambulatory Visit (HOSPITAL_BASED_OUTPATIENT_CLINIC_OR_DEPARTMENT_OTHER): Payer: BC Managed Care – PPO | Admitting: Lab

## 2012-08-19 ENCOUNTER — Ambulatory Visit (HOSPITAL_BASED_OUTPATIENT_CLINIC_OR_DEPARTMENT_OTHER): Payer: BC Managed Care – PPO | Admitting: Adult Health

## 2012-08-19 ENCOUNTER — Ambulatory Visit: Payer: BC Managed Care – PPO

## 2012-08-19 ENCOUNTER — Encounter: Payer: Self-pay | Admitting: Adult Health

## 2012-08-19 VITALS — BP 133/79 | HR 97 | Temp 98.7°F | Resp 20 | Ht 63.0 in | Wt 240.6 lb

## 2012-08-19 DIAGNOSIS — C50419 Malignant neoplasm of upper-outer quadrant of unspecified female breast: Secondary | ICD-10-CM

## 2012-08-19 DIAGNOSIS — C7951 Secondary malignant neoplasm of bone: Secondary | ICD-10-CM

## 2012-08-19 DIAGNOSIS — C7952 Secondary malignant neoplasm of bone marrow: Secondary | ICD-10-CM

## 2012-08-19 DIAGNOSIS — Z5111 Encounter for antineoplastic chemotherapy: Secondary | ICD-10-CM

## 2012-08-19 DIAGNOSIS — C50411 Malignant neoplasm of upper-outer quadrant of right female breast: Secondary | ICD-10-CM

## 2012-08-19 DIAGNOSIS — G609 Hereditary and idiopathic neuropathy, unspecified: Secondary | ICD-10-CM

## 2012-08-19 DIAGNOSIS — G4700 Insomnia, unspecified: Secondary | ICD-10-CM

## 2012-08-19 DIAGNOSIS — R609 Edema, unspecified: Secondary | ICD-10-CM

## 2012-08-19 LAB — COMPREHENSIVE METABOLIC PANEL (CC13)
Alkaline Phosphatase: 43 U/L (ref 40–150)
BUN: 17.4 mg/dL (ref 7.0–26.0)
Creatinine: 0.8 mg/dL (ref 0.6–1.1)
Glucose: 85 mg/dl (ref 70–140)
Sodium: 137 mEq/L (ref 136–145)
Total Bilirubin: 0.4 mg/dL (ref 0.20–1.20)
Total Protein: 6 g/dL — ABNORMAL LOW (ref 6.4–8.3)

## 2012-08-19 LAB — CBC WITH DIFFERENTIAL/PLATELET
Eosinophils Absolute: 0.2 10*3/uL (ref 0.0–0.5)
LYMPH%: 17.8 % (ref 14.0–49.7)
MCV: 98.5 fL (ref 79.5–101.0)
MONO%: 8.3 % (ref 0.0–14.0)
NEUT#: 4.6 10*3/uL (ref 1.5–6.5)
Platelets: 219 10*3/uL (ref 145–400)
RBC: 3.31 10*6/uL — ABNORMAL LOW (ref 3.70–5.45)

## 2012-08-19 LAB — TECHNOLOGIST REVIEW

## 2012-08-19 MED ORDER — ONDANSETRON 8 MG/50ML IVPB (CHCC)
8.0000 mg | Freq: Once | INTRAVENOUS | Status: AC
  Administered 2012-08-19: 8 mg via INTRAVENOUS

## 2012-08-19 MED ORDER — HEPARIN SOD (PORK) LOCK FLUSH 100 UNIT/ML IV SOLN
500.0000 [IU] | Freq: Once | INTRAVENOUS | Status: AC | PRN
  Administered 2012-08-19: 500 [IU]
  Filled 2012-08-19: qty 5

## 2012-08-19 MED ORDER — FAMOTIDINE IN NACL 20-0.9 MG/50ML-% IV SOLN
20.0000 mg | Freq: Once | INTRAVENOUS | Status: AC
  Administered 2012-08-19: 20 mg via INTRAVENOUS

## 2012-08-19 MED ORDER — FUROSEMIDE 20 MG PO TABS
10.0000 mg | ORAL_TABLET | Freq: Once | ORAL | Status: AC
  Administered 2012-08-19: 10 mg via ORAL
  Filled 2012-08-19: qty 0.5

## 2012-08-19 MED ORDER — SODIUM CHLORIDE 0.9 % IJ SOLN
10.0000 mL | INTRAMUSCULAR | Status: DC | PRN
Start: 2012-08-19 — End: 2012-08-19
  Administered 2012-08-19: 10 mL
  Filled 2012-08-19: qty 10

## 2012-08-19 MED ORDER — SODIUM CHLORIDE 0.9 % IV SOLN
Freq: Once | INTRAVENOUS | Status: AC
  Administered 2012-08-19: 11:00:00 via INTRAVENOUS

## 2012-08-19 MED ORDER — DEXAMETHASONE SODIUM PHOSPHATE 20 MG/5ML IJ SOLN
20.0000 mg | Freq: Once | INTRAMUSCULAR | Status: AC
  Administered 2012-08-19: 20 mg via INTRAVENOUS

## 2012-08-19 MED ORDER — PACLITAXEL CHEMO INJECTION 300 MG/50ML
80.0000 mg/m2 | Freq: Once | INTRAVENOUS | Status: AC
  Administered 2012-08-19: 168 mg via INTRAVENOUS
  Filled 2012-08-19: qty 28

## 2012-08-19 MED ORDER — DIPHENHYDRAMINE HCL 50 MG/ML IJ SOLN
50.0000 mg | Freq: Once | INTRAMUSCULAR | Status: AC
  Administered 2012-08-19: 50 mg via INTRAVENOUS

## 2012-08-19 NOTE — Patient Instructions (Signed)
Doing well.  Proceed with chemotherapy.  Take Prilosec 2 tablets first thing in the morning before any food or drink.  Please call us if you have any questions or concerns.

## 2012-08-19 NOTE — Patient Instructions (Addendum)
[facility] Discharge Instructions for Patients Receiving Chemotherapy  Today you received the following chemotherapy agents TAXOL   To help prevent nausea and vomiting after your treatment, we encourage you to take your nausea medication AS DISCUSSED.   If you develop nausea and vomiting that is not controlled by your nausea medication, call the clinic.   BELOW ARE SYMPTOMS THAT SHOULD BE REPORTED IMMEDIATELY:  *FEVER GREATER THAN 100.5 F  *CHILLS WITH OR WITHOUT FEVER  NAUSEA AND VOMITING THAT IS NOT CONTROLLED WITH YOUR NAUSEA MEDICATION  *UNUSUAL SHORTNESS OF BREATH  *UNUSUAL BRUISING OR BLEEDING  TENDERNESS IN MOUTH AND THROAT WITH OR WITHOUT PRESENCE OF ULCERS  *URINARY PROBLEMS  *BOWEL PROBLEMS  UNUSUAL RASH Items with * indicate a potential emergency and should be followed up as soon as possible.  Feel free to call the clinic you have any questions or concerns. The clinic phone number is [phone].
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
## 2012-08-19 NOTE — Telephone Encounter (Signed)
appts made and printed...td 

## 2012-08-25 ENCOUNTER — Other Ambulatory Visit: Payer: Self-pay | Admitting: Emergency Medicine

## 2012-08-25 DIAGNOSIS — C50419 Malignant neoplasm of upper-outer quadrant of unspecified female breast: Secondary | ICD-10-CM

## 2012-08-26 ENCOUNTER — Ambulatory Visit (HOSPITAL_BASED_OUTPATIENT_CLINIC_OR_DEPARTMENT_OTHER): Payer: BC Managed Care – PPO | Admitting: Oncology

## 2012-08-26 ENCOUNTER — Other Ambulatory Visit (HOSPITAL_BASED_OUTPATIENT_CLINIC_OR_DEPARTMENT_OTHER): Payer: BC Managed Care – PPO | Admitting: Lab

## 2012-08-26 ENCOUNTER — Ambulatory Visit (HOSPITAL_BASED_OUTPATIENT_CLINIC_OR_DEPARTMENT_OTHER): Payer: BC Managed Care – PPO

## 2012-08-26 ENCOUNTER — Other Ambulatory Visit: Payer: BC Managed Care – PPO | Admitting: Lab

## 2012-08-26 ENCOUNTER — Encounter: Payer: Self-pay | Admitting: Oncology

## 2012-08-26 VITALS — BP 114/74 | HR 97 | Temp 98.4°F | Resp 20 | Ht 63.0 in | Wt 241.9 lb

## 2012-08-26 DIAGNOSIS — Z5111 Encounter for antineoplastic chemotherapy: Secondary | ICD-10-CM

## 2012-08-26 DIAGNOSIS — C50411 Malignant neoplasm of upper-outer quadrant of right female breast: Secondary | ICD-10-CM

## 2012-08-26 DIAGNOSIS — C50419 Malignant neoplasm of upper-outer quadrant of unspecified female breast: Secondary | ICD-10-CM

## 2012-08-26 DIAGNOSIS — C7952 Secondary malignant neoplasm of bone marrow: Secondary | ICD-10-CM

## 2012-08-26 DIAGNOSIS — C50219 Malignant neoplasm of upper-inner quadrant of unspecified female breast: Secondary | ICD-10-CM

## 2012-08-26 DIAGNOSIS — G589 Mononeuropathy, unspecified: Secondary | ICD-10-CM

## 2012-08-26 DIAGNOSIS — C7951 Secondary malignant neoplasm of bone: Secondary | ICD-10-CM

## 2012-08-26 DIAGNOSIS — C773 Secondary and unspecified malignant neoplasm of axilla and upper limb lymph nodes: Secondary | ICD-10-CM

## 2012-08-26 DIAGNOSIS — G4700 Insomnia, unspecified: Secondary | ICD-10-CM

## 2012-08-26 DIAGNOSIS — R0609 Other forms of dyspnea: Secondary | ICD-10-CM

## 2012-08-26 LAB — COMPREHENSIVE METABOLIC PANEL (CC13)
AST: 21 U/L (ref 5–34)
Albumin: 3 g/dL — ABNORMAL LOW (ref 3.5–5.0)
Alkaline Phosphatase: 51 U/L (ref 40–150)
BUN: 16.3 mg/dL (ref 7.0–26.0)
Calcium: 8.8 mg/dL (ref 8.4–10.4)
Chloride: 105 mEq/L (ref 98–109)
Glucose: 173 mg/dl — ABNORMAL HIGH (ref 70–140)
Potassium: 3.9 mEq/L (ref 3.5–5.1)
Sodium: 138 mEq/L (ref 136–145)
Total Protein: 6.1 g/dL — ABNORMAL LOW (ref 6.4–8.3)

## 2012-08-26 LAB — CBC WITH DIFFERENTIAL/PLATELET
BASO%: 0.3 % (ref 0.0–2.0)
Eosinophils Absolute: 0.1 10*3/uL (ref 0.0–0.5)
LYMPH%: 16.9 % (ref 14.0–49.7)
MONO#: 0.3 10*3/uL (ref 0.1–0.9)
NEUT#: 5 10*3/uL (ref 1.5–6.5)
Platelets: 257 10*3/uL (ref 145–400)
RBC: 3.38 10*6/uL — ABNORMAL LOW (ref 3.70–5.45)
RDW: 19.9 % — ABNORMAL HIGH (ref 11.2–14.5)
WBC: 6.6 10*3/uL (ref 3.9–10.3)
lymph#: 1.1 10*3/uL (ref 0.9–3.3)

## 2012-08-26 MED ORDER — SODIUM CHLORIDE 0.9 % IJ SOLN
10.0000 mL | INTRAMUSCULAR | Status: DC | PRN
  Administered 2012-08-26: 10 mL
  Filled 2012-08-26: qty 10

## 2012-08-26 MED ORDER — DIPHENHYDRAMINE HCL 50 MG/ML IJ SOLN
50.0000 mg | Freq: Once | INTRAMUSCULAR | Status: AC
  Administered 2012-08-26: 50 mg via INTRAVENOUS

## 2012-08-26 MED ORDER — FAMOTIDINE IN NACL 20-0.9 MG/50ML-% IV SOLN
20.0000 mg | Freq: Once | INTRAVENOUS | Status: AC
Start: 2012-08-26 — End: 2012-08-26
  Administered 2012-08-26: 20 mg via INTRAVENOUS

## 2012-08-26 MED ORDER — HEPARIN SOD (PORK) LOCK FLUSH 100 UNIT/ML IV SOLN
500.0000 [IU] | Freq: Once | INTRAVENOUS | Status: AC | PRN
  Administered 2012-08-26: 500 [IU]
  Filled 2012-08-26: qty 5

## 2012-08-26 MED ORDER — PACLITAXEL CHEMO INJECTION 300 MG/50ML
80.0000 mg/m2 | Freq: Once | INTRAVENOUS | Status: AC
  Administered 2012-08-26: 168 mg via INTRAVENOUS
  Filled 2012-08-26: qty 28

## 2012-08-26 MED ORDER — ONDANSETRON 8 MG/50ML IVPB (CHCC)
8.0000 mg | Freq: Once | INTRAVENOUS | Status: AC
  Administered 2012-08-26: 8 mg via INTRAVENOUS

## 2012-08-26 MED ORDER — DEXAMETHASONE SODIUM PHOSPHATE 20 MG/5ML IJ SOLN
20.0000 mg | Freq: Once | INTRAMUSCULAR | Status: AC
  Administered 2012-08-26: 20 mg via INTRAVENOUS

## 2012-08-26 MED ORDER — SODIUM CHLORIDE 0.9 % IV SOLN
Freq: Once | INTRAVENOUS | Status: AC
  Administered 2012-08-26: 11:00:00 via INTRAVENOUS

## 2012-08-26 NOTE — Patient Instructions (Addendum)
[facility] Discharge Instructions for Patients Receiving Chemotherapy  Today you received the following chemotherapy agents Taxol.  To help prevent nausea and vomiting after your treatment, we encourage you to take your nausea medication as directed.    If you develop nausea and vomiting that is not controlled by your nausea medication, call the clinic.   BELOW ARE SYMPTOMS THAT SHOULD BE REPORTED IMMEDIATELY:  *FEVER GREATER THAN 100.5 F  *CHILLS WITH OR WITHOUT FEVER  NAUSEA AND VOMITING THAT IS NOT CONTROLLED WITH YOUR NAUSEA MEDICATION  *UNUSUAL SHORTNESS OF BREATH  *UNUSUAL BRUISING OR BLEEDING  TENDERNESS IN MOUTH AND THROAT WITH OR WITHOUT PRESENCE OF ULCERS  *URINARY PROBLEMS  *BOWEL PROBLEMS  UNUSUAL RASH Items with * indicate a potential emergency and should be followed up as soon as possible.  Feel free to call the clinic you have any questions or concerns. The clinic phone number is [phone].    
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
## 2012-08-30 ENCOUNTER — Other Ambulatory Visit: Payer: Self-pay | Admitting: *Deleted

## 2012-08-30 DIAGNOSIS — D702 Other drug-induced agranulocytosis: Secondary | ICD-10-CM

## 2012-08-30 MED ORDER — CIPROFLOXACIN HCL 500 MG PO TABS: 500.0000 mg | ORAL_TABLET | Freq: Two times a day (BID) | ORAL | Status: DC

## 2012-08-31 ENCOUNTER — Ambulatory Visit: Payer: BC Managed Care – PPO | Admitting: Oncology

## 2012-08-31 ENCOUNTER — Other Ambulatory Visit: Payer: BC Managed Care – PPO | Admitting: Lab

## 2012-09-02 ENCOUNTER — Other Ambulatory Visit: Payer: BC Managed Care – PPO | Admitting: Lab

## 2012-09-02 ENCOUNTER — Other Ambulatory Visit: Payer: Self-pay | Admitting: Medical Oncology

## 2012-09-02 ENCOUNTER — Ambulatory Visit (HOSPITAL_BASED_OUTPATIENT_CLINIC_OR_DEPARTMENT_OTHER): Payer: BC Managed Care – PPO

## 2012-09-02 ENCOUNTER — Encounter: Payer: Self-pay | Admitting: Adult Health

## 2012-09-02 ENCOUNTER — Other Ambulatory Visit (HOSPITAL_BASED_OUTPATIENT_CLINIC_OR_DEPARTMENT_OTHER): Payer: BC Managed Care – PPO

## 2012-09-02 ENCOUNTER — Telehealth: Payer: Self-pay | Admitting: *Deleted

## 2012-09-02 ENCOUNTER — Ambulatory Visit (HOSPITAL_BASED_OUTPATIENT_CLINIC_OR_DEPARTMENT_OTHER): Payer: BC Managed Care – PPO | Admitting: Adult Health

## 2012-09-02 VITALS — BP 115/77 | HR 120 | Temp 99.9°F | Resp 20 | Ht 63.0 in | Wt 240.7 lb

## 2012-09-02 DIAGNOSIS — C7952 Secondary malignant neoplasm of bone marrow: Secondary | ICD-10-CM

## 2012-09-02 DIAGNOSIS — C50411 Malignant neoplasm of upper-outer quadrant of right female breast: Secondary | ICD-10-CM

## 2012-09-02 DIAGNOSIS — Z5111 Encounter for antineoplastic chemotherapy: Secondary | ICD-10-CM

## 2012-09-02 DIAGNOSIS — C50419 Malignant neoplasm of upper-outer quadrant of unspecified female breast: Secondary | ICD-10-CM

## 2012-09-02 DIAGNOSIS — C773 Secondary and unspecified malignant neoplasm of axilla and upper limb lymph nodes: Secondary | ICD-10-CM

## 2012-09-02 DIAGNOSIS — N6310 Unspecified lump in the right breast, unspecified quadrant: Secondary | ICD-10-CM

## 2012-09-02 DIAGNOSIS — G4700 Insomnia, unspecified: Secondary | ICD-10-CM

## 2012-09-02 DIAGNOSIS — Z170 Estrogen receptor positive status [ER+]: Secondary | ICD-10-CM

## 2012-09-02 DIAGNOSIS — R0609 Other forms of dyspnea: Secondary | ICD-10-CM

## 2012-09-02 DIAGNOSIS — R635 Abnormal weight gain: Secondary | ICD-10-CM

## 2012-09-02 DIAGNOSIS — R0600 Dyspnea, unspecified: Secondary | ICD-10-CM

## 2012-09-02 DIAGNOSIS — R0601 Orthopnea: Secondary | ICD-10-CM

## 2012-09-02 DIAGNOSIS — C7951 Secondary malignant neoplasm of bone: Secondary | ICD-10-CM

## 2012-09-02 DIAGNOSIS — G589 Mononeuropathy, unspecified: Secondary | ICD-10-CM

## 2012-09-02 LAB — CBC WITH DIFFERENTIAL/PLATELET
BASO%: 0.3 % (ref 0.0–2.0)
EOS%: 1.4 % (ref 0.0–7.0)
MCH: 33.3 pg (ref 25.1–34.0)
MCHC: 33.1 g/dL (ref 31.5–36.0)
NEUT%: 72 % (ref 38.4–76.8)
RDW: 19.1 % — ABNORMAL HIGH (ref 11.2–14.5)
lymph#: 1 10*3/uL (ref 0.9–3.3)

## 2012-09-02 LAB — COMPREHENSIVE METABOLIC PANEL (CC13)
AST: 28 U/L (ref 5–34)
Albumin: 3.2 g/dL — ABNORMAL LOW (ref 3.5–5.0)
Alkaline Phosphatase: 53 U/L (ref 40–150)
BUN: 12.6 mg/dL (ref 7.0–26.0)
Potassium: 4.3 mEq/L (ref 3.5–5.1)
Sodium: 139 mEq/L (ref 136–145)

## 2012-09-02 MED ORDER — DENOSUMAB 120 MG/1.7ML ~~LOC~~ SOLN
120.0000 mg | Freq: Once | SUBCUTANEOUS | Status: AC
  Administered 2012-09-02: 120 mg via SUBCUTANEOUS
  Filled 2012-09-02: qty 1.7

## 2012-09-02 MED ORDER — FUROSEMIDE 20 MG PO TABS: 20.0000 mg | ORAL_TABLET | Freq: Every day | ORAL | Status: DC

## 2012-09-02 MED ORDER — ONDANSETRON 8 MG/50ML IVPB (CHCC)
8.0000 mg | Freq: Once | INTRAVENOUS | Status: AC
  Administered 2012-09-02: 8 mg via INTRAVENOUS

## 2012-09-02 MED ORDER — DIPHENHYDRAMINE HCL 50 MG/ML IJ SOLN
50.0000 mg | Freq: Once | INTRAMUSCULAR | Status: AC
  Administered 2012-09-02: 50 mg via INTRAVENOUS

## 2012-09-02 MED ORDER — DEXTROSE 5 % IV SOLN
80.0000 mg/m2 | Freq: Once | INTRAVENOUS | Status: AC
  Administered 2012-09-02: 168 mg via INTRAVENOUS
  Filled 2012-09-02: qty 28

## 2012-09-02 MED ORDER — SODIUM CHLORIDE 0.9 % IV SOLN
Freq: Once | INTRAVENOUS | Status: AC
  Administered 2012-09-02: 13:00:00 via INTRAVENOUS

## 2012-09-02 MED ORDER — FAMOTIDINE IN NACL 20-0.9 MG/50ML-% IV SOLN
20.0000 mg | Freq: Once | INTRAVENOUS | Status: AC
  Administered 2012-09-02: 20 mg via INTRAVENOUS

## 2012-09-02 MED ORDER — GOSERELIN ACETATE 3.6 MG ~~LOC~~ IMPL
3.6000 mg | DRUG_IMPLANT | SUBCUTANEOUS | Status: DC
  Administered 2012-09-02: 3.6 mg via SUBCUTANEOUS
  Filled 2012-09-02: qty 3.6

## 2012-09-02 MED ORDER — DEXAMETHASONE SODIUM PHOSPHATE 20 MG/5ML IJ SOLN
20.0000 mg | Freq: Once | INTRAMUSCULAR | Status: AC
  Administered 2012-09-02: 20 mg via INTRAVENOUS

## 2012-09-02 MED ORDER — SODIUM CHLORIDE 0.9 % IJ SOLN
10.0000 mL | INTRAMUSCULAR | Status: DC | PRN
  Administered 2012-09-02: 10 mL
  Filled 2012-09-02: qty 10

## 2012-09-02 MED ORDER — HEPARIN SOD (PORK) LOCK FLUSH 100 UNIT/ML IV SOLN
500.0000 [IU] | Freq: Once | INTRAVENOUS | Status: AC | PRN
  Administered 2012-09-02: 500 [IU]
  Filled 2012-09-02: qty 5

## 2012-09-02 MED ORDER — POTASSIUM CHLORIDE CRYS ER 20 MEQ PO TBCR: 20.0000 meq | EXTENDED_RELEASE_TABLET | Freq: Every day | ORAL | Status: DC

## 2012-09-02 NOTE — Patient Instructions (Addendum)
Doing well.  Proceed with chemotherapy.  I prescribed Lasix and potassium today to help relieve the fluid overload.  We will order an echo as well.  Please call us if you have any questions or concerns.    Potassium (K) Potassium is an electrolyte that helps regulate the amount of fluid in the body. It also stimulates muscle contraction and maintains a stable acid-base balance. Most of the body's potassium is inside of cells, and only a very small amount is in the blood. Because the amount in the blood is so small, minor changes can have big effects. PREPARATION FOR TEST Testing for potassium requires taking a blood sample taken by needle from a vein in the arm. The skin is cleaned thoroughly before the sample is drawn. There is no other special preparation needed. NORMAL FINDINGS  Adults: 3.5-5 mEq/L (3.5-5 mmol/L).  Premature neonates, cord blood: 5-10.2 mEq/L (5-10.2 mmol/L).  Premature neonates, 48 hours: 3-6 mEq/L (3-6 mmol/L).  Neonates: 3.7-5.9 mEq/L (3.7-5.9 mmol/L).  Neonates, cord blood: 5.6-12 mEq/L (5.6-12 mmol/L).  Infants: 4.1-5.3 mEq/L (4.1-5.3 mmol/L).  Children: 3.4-4.7 mEq/L (3.4-4.7 mmol/L). Ranges for normal findings may vary among different laboratories and hospitals. You should always check with your doctor after having lab work or other tests done to discuss the meaning of your test results and whether your values are considered within normal limits. MEANING OF TEST Your caregiver will go over the test results with you and discuss the importance and meaning of your results, as well as treatment options and the need for additional tests if necessary. OBTAINING THE TEST RESULTS It is your responsibility to obtain your test results. Ask the lab or department performing the test when and how you will get your results. Document Released: 02/15/2004 Document Revised: 04/06/2011 Document Reviewed: 12/25/2007 [facility] Patient Information 2014 [place], [place]. Furosemide  tablets What is this medicine? FUROSEMIDE (fyoor [place] se mide) is a diuretic. It helps you make more urine and to lose salt and excess water from your body. This medicine is used to treat high blood pressure, and edema or swelling from heart, kidney, or liver disease. This medicine may be used for other purposes; ask your health care provider or pharmacist if you have questions. What should I tell my health care provider before I take this medicine? They need to know if you have any of these conditions: -abnormal blood electrolytes -diarrhea or vomiting -gout -heart disease -kidney disease, small amounts of urine, or difficulty passing urine -liver disease -an unusual or allergic reaction to furosemide, sulfa drugs, other medicines, foods, dyes, or preservatives -pregnant or trying to get pregnant -breast-feeding How should I use this medicine? Take this medicine by mouth with a glass of water. Follow the directions on the prescription label. You may take this medicine with or without food. If it upsets your stomach, take it with food or milk. Do not take your medicine more often than directed. Remember that you will need to pass more urine after taking this medicine. Do not take your medicine at a time of day that will cause you problems. Do not take at bedtime. Talk to your pediatrician regarding the use of this medicine in children. While this drug may be prescribed for selected conditions, precautions do apply. Overdosage: If you think you have taken too much of this medicine contact a poison control center or emergency room at once. NOTE: This medicine is only for you. Do not share this medicine with others. What if I miss a dose?  If you miss a dose, take it as soon as you can. If it is almost time for your next dose, take only that dose. Do not take double or extra doses. What may interact with this medicine? -aspirin and aspirin-like medicines -certain antibiotics -chloral  hydrate -cisplatin -cyclosporine -digoxin -diuretics -laxatives -lithium -medicines for blood pressure -medicines that relax muscles for surgery -methotrexate -NSAIDs, medicines for pain and inflammation like ibuprofen, naproxen, or indomethacin -phenytoin -steroid medicines like prednisone or cortisone -sucralfate This list may not describe all possible interactions. Give your health care provider a list of all the medicines, herbs, non-prescription drugs, or dietary supplements you use. Also tell them if you smoke, drink alcohol, or use illegal drugs. Some items may interact with your medicine. What should I watch for while using this medicine? Visit your doctor or health care professional for regular checks on your progress. Check your blood pressure regularly. Ask your doctor or health care professional what your blood pressure should be, and when you should contact him or her. If you are a diabetic, check your blood sugar as directed. You may need to be on a special diet while taking this medicine. Check with your doctor. Also, ask how many glasses of fluid you need to drink a day. You must not get dehydrated. You may get drowsy or dizzy. Do not drive, use machinery, or do anything that needs mental alertness until you know how this drug affects you. Do not stand or sit up quickly, especially if you are an older patient. This reduces the risk of dizzy or fainting spells. Alcohol can make you more drowsy and dizzy. Avoid alcoholic drinks. This medicine can make you more sensitive to the sun. Keep out of the sun. If you cannot avoid being in the sun, wear protective clothing and use sunscreen. Do not use sun lamps or tanning beds/booths. What side effects may I notice from receiving this medicine? Side effects that you should report to your doctor or health care professional as soon as possible: -blood in urine or stools -dry mouth -fever or chills -hearing loss or ringing in the  ears -irregular heartbeat -muscle pain or weakness, cramps -skin rash -stomach upset, pain, or nausea -tingling or numbness in the hands or feet -unusually weak or tired -vomiting or diarrhea -yellowing of the eyes or skin Side effects that usually do not require medical attention (report to your doctor or health care professional if they continue or are bothersome): -headache -loss of appetite -unusual bleeding or bruising This list may not describe all possible side effects. Call your doctor for medical advice about side effects. You may report side effects to FDA at 1-800-FDA-1088. Where should I keep my medicine? Keep out of the reach of children. Store at room temperature between 15 and 30 degrees C (59 and 86 degrees F). Protect from light. Throw away any unused medicine after the expiration date. NOTE: This sheet is a summary. It may not cover all possible information. If you have questions about this medicine, talk to your doctor, pharmacist, or health care provider.  2013, [employer]. (12/31/2008 4:24:50 PM)

## 2012-09-02 NOTE — Telephone Encounter (Signed)
Made the pt aware that echo has to be pre cert. gv order to [place]....td

## 2012-09-02 NOTE — Patient Instructions (Addendum)
[facility] Discharge Instructions for Patients Receiving Chemotherapy  Today you received the following chemotherapy agents taxol. You also received xgeva and zoladex.  To help prevent nausea and vomiting after your treatment, we encourage you to take your nausea medication zofran and ativan.   If you develop nausea and vomiting that is not controlled by your nausea medication, call the clinic.   BELOW ARE SYMPTOMS THAT SHOULD BE REPORTED IMMEDIATELY:  *FEVER GREATER THAN 100.5 F  *CHILLS WITH OR WITHOUT FEVER  NAUSEA AND VOMITING THAT IS NOT CONTROLLED WITH YOUR NAUSEA MEDICATION  *UNUSUAL SHORTNESS OF BREATH  *UNUSUAL BRUISING OR BLEEDING  TENDERNESS IN MOUTH AND THROAT WITH OR WITHOUT PRESENCE OF ULCERS  *URINARY PROBLEMS  *BOWEL PROBLEMS  UNUSUAL RASH Items with * indicate a potential emergency and should be followed up as soon as possible.  Feel free to call the clinic you have any questions or concerns. The clinic phone number is [phone].
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
## 2012-09-05 ENCOUNTER — Other Ambulatory Visit: Payer: Self-pay | Admitting: *Deleted

## 2012-09-05 ENCOUNTER — Telehealth: Payer: Self-pay | Admitting: *Deleted

## 2012-09-05 DIAGNOSIS — C50411 Malignant neoplasm of upper-outer quadrant of right female breast: Secondary | ICD-10-CM

## 2012-09-05 MED ORDER — DEXAMETHASONE 4 MG PO TABS: ORAL_TABLET | ORAL | Status: DC

## 2012-09-05 NOTE — Telephone Encounter (Signed)
gv appt for echo here @ [facility] 09/06/12 @ 1pm. Pt is aware...td

## 2012-09-06 ENCOUNTER — Other Ambulatory Visit (HOSPITAL_COMMUNITY): Payer: BC Managed Care – PPO

## 2012-09-08 ENCOUNTER — Ambulatory Visit (HOSPITAL_COMMUNITY)
Admission: RE | Admit: 2012-09-08 | Discharge: 2012-09-08 | Disposition: A | Discharge status: Home / Self Care | Payer: BC Managed Care – PPO | Source: Ambulatory Visit | Attending: Adult Health | Admitting: Adult Health

## 2012-09-08 ENCOUNTER — Other Ambulatory Visit: Payer: Self-pay | Admitting: *Deleted

## 2012-09-08 DIAGNOSIS — R635 Abnormal weight gain: Secondary | ICD-10-CM

## 2012-09-08 DIAGNOSIS — R0600 Dyspnea, unspecified: Secondary | ICD-10-CM

## 2012-09-08 DIAGNOSIS — C50419 Malignant neoplasm of upper-outer quadrant of unspecified female breast: Secondary | ICD-10-CM | POA: Insufficient documentation

## 2012-09-08 DIAGNOSIS — C50411 Malignant neoplasm of upper-outer quadrant of right female breast: Secondary | ICD-10-CM

## 2012-09-08 DIAGNOSIS — R0601 Orthopnea: Secondary | ICD-10-CM | POA: Insufficient documentation

## 2012-09-08 DIAGNOSIS — Z09 Encounter for follow-up examination after completed treatment for conditions other than malignant neoplasm: Secondary | ICD-10-CM

## 2012-09-08 NOTE — Progress Notes (Signed)
  Echocardiogram 2D Echocardiogram has been performed.  Arvil Chaco 09/08/2012, 1:05 PM

## 2012-09-09 ENCOUNTER — Other Ambulatory Visit (HOSPITAL_BASED_OUTPATIENT_CLINIC_OR_DEPARTMENT_OTHER): Payer: BC Managed Care – PPO | Admitting: Lab

## 2012-09-09 ENCOUNTER — Ambulatory Visit (HOSPITAL_BASED_OUTPATIENT_CLINIC_OR_DEPARTMENT_OTHER): Payer: BC Managed Care – PPO

## 2012-09-09 ENCOUNTER — Encounter (HOSPITAL_COMMUNITY): Payer: Self-pay

## 2012-09-09 ENCOUNTER — Ambulatory Visit (HOSPITAL_BASED_OUTPATIENT_CLINIC_OR_DEPARTMENT_OTHER): Payer: BC Managed Care – PPO | Admitting: Adult Health

## 2012-09-09 ENCOUNTER — Other Ambulatory Visit: Payer: BC Managed Care – PPO | Admitting: Lab

## 2012-09-09 ENCOUNTER — Ambulatory Visit (HOSPITAL_COMMUNITY)
Admission: RE | Admit: 2012-09-09 | Discharge: 2012-09-09 | Disposition: A | Discharge status: Home / Self Care | Payer: BC Managed Care – PPO | Source: Ambulatory Visit | Attending: Adult Health | Admitting: Adult Health

## 2012-09-09 ENCOUNTER — Encounter: Payer: Self-pay | Admitting: Adult Health

## 2012-09-09 VITALS — BP 135/73 | HR 123 | Temp 98.0°F | Resp 20 | Ht 63.0 in | Wt 238.1 lb

## 2012-09-09 DIAGNOSIS — Z5111 Encounter for antineoplastic chemotherapy: Secondary | ICD-10-CM

## 2012-09-09 DIAGNOSIS — Z170 Estrogen receptor positive status [ER+]: Secondary | ICD-10-CM

## 2012-09-09 DIAGNOSIS — C7952 Secondary malignant neoplasm of bone marrow: Secondary | ICD-10-CM

## 2012-09-09 DIAGNOSIS — C50419 Malignant neoplasm of upper-outer quadrant of unspecified female breast: Secondary | ICD-10-CM

## 2012-09-09 DIAGNOSIS — R079 Chest pain, unspecified: Secondary | ICD-10-CM | POA: Insufficient documentation

## 2012-09-09 DIAGNOSIS — R911 Solitary pulmonary nodule: Secondary | ICD-10-CM | POA: Insufficient documentation

## 2012-09-09 DIAGNOSIS — R0682 Tachypnea, not elsewhere classified: Secondary | ICD-10-CM

## 2012-09-09 DIAGNOSIS — C50411 Malignant neoplasm of upper-outer quadrant of right female breast: Secondary | ICD-10-CM

## 2012-09-09 DIAGNOSIS — R000 Tachycardia, unspecified: Secondary | ICD-10-CM

## 2012-09-09 DIAGNOSIS — C7951 Secondary malignant neoplasm of bone: Secondary | ICD-10-CM | POA: Insufficient documentation

## 2012-09-09 DIAGNOSIS — R0602 Shortness of breath: Secondary | ICD-10-CM | POA: Insufficient documentation

## 2012-09-09 DIAGNOSIS — G4700 Insomnia, unspecified: Secondary | ICD-10-CM

## 2012-09-09 DIAGNOSIS — K7689 Other specified diseases of liver: Secondary | ICD-10-CM | POA: Insufficient documentation

## 2012-09-09 DIAGNOSIS — C50919 Malignant neoplasm of unspecified site of unspecified female breast: Secondary | ICD-10-CM | POA: Insufficient documentation

## 2012-09-09 DIAGNOSIS — C773 Secondary and unspecified malignant neoplasm of axilla and upper limb lymph nodes: Secondary | ICD-10-CM

## 2012-09-09 LAB — CBC WITH DIFFERENTIAL/PLATELET
BASO%: 0.6 % (ref 0.0–2.0)
LYMPH%: 24.7 % (ref 14.0–49.7)
MCHC: 33.3 g/dL (ref 31.5–36.0)
MONO#: 0.7 10*3/uL (ref 0.1–0.9)
NEUT#: 5 10*3/uL (ref 1.5–6.5)
RBC: 3.38 10*6/uL — ABNORMAL LOW (ref 3.70–5.45)
RDW: 18 % — ABNORMAL HIGH (ref 11.2–14.5)
WBC: 7.8 10*3/uL (ref 3.9–10.3)
lymph#: 1.9 10*3/uL (ref 0.9–3.3)
nRBC: 3 % — ABNORMAL HIGH (ref 0–0)

## 2012-09-09 MED ORDER — METOPROLOL TARTRATE 25 MG PO TABS: 12.5000 mg | ORAL_TABLET | Freq: Two times a day (BID) | ORAL | Status: DC

## 2012-09-09 MED ORDER — DIPHENHYDRAMINE HCL 50 MG/ML IJ SOLN
50.0000 mg | Freq: Once | INTRAMUSCULAR | Status: AC
  Administered 2012-09-09: 50 mg via INTRAVENOUS

## 2012-09-09 MED ORDER — PACLITAXEL CHEMO INJECTION 300 MG/50ML
80.0000 mg/m2 | Freq: Once | INTRAVENOUS | Status: AC
  Administered 2012-09-09: 168 mg via INTRAVENOUS
  Filled 2012-09-09: qty 28

## 2012-09-09 MED ORDER — SODIUM CHLORIDE 0.9 % IJ SOLN
10.0000 mL | INTRAMUSCULAR | Status: DC | PRN
  Administered 2012-09-09: 10 mL
  Filled 2012-09-09: qty 10

## 2012-09-09 MED ORDER — DEXAMETHASONE SODIUM PHOSPHATE 20 MG/5ML IJ SOLN
20.0000 mg | Freq: Once | INTRAMUSCULAR | Status: AC
  Administered 2012-09-09: 20 mg via INTRAVENOUS

## 2012-09-09 MED ORDER — ONDANSETRON 8 MG/50ML IVPB (CHCC)
8.0000 mg | Freq: Once | INTRAVENOUS | Status: AC
  Administered 2012-09-09: 8 mg via INTRAVENOUS

## 2012-09-09 MED ORDER — FAMOTIDINE IN NACL 20-0.9 MG/50ML-% IV SOLN
20.0000 mg | Freq: Once | INTRAVENOUS | Status: AC
  Administered 2012-09-09: 20 mg via INTRAVENOUS

## 2012-09-09 MED ORDER — SODIUM CHLORIDE 0.9 % IV SOLN
Freq: Once | INTRAVENOUS | Status: AC
  Administered 2012-09-09: 14:00:00 via INTRAVENOUS

## 2012-09-09 MED ORDER — IOHEXOL 350 MG/ML SOLN
100.0000 mL | Freq: Once | INTRAVENOUS | Status: AC | PRN
  Administered 2012-09-09: 100 mL via INTRAVENOUS

## 2012-09-09 MED ORDER — HEPARIN SOD (PORK) LOCK FLUSH 100 UNIT/ML IV SOLN
500.0000 [IU] | Freq: Once | INTRAVENOUS | Status: AC | PRN
  Administered 2012-09-09: 500 [IU]
  Filled 2012-09-09: qty 5
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
## 2012-09-09 NOTE — Patient Instructions (Addendum)
Metoprolol tablets What is this medicine? METOPROLOL (me TOE proe lole) is a beta-blocker. Beta-blockers reduce the workload on the heart and help it to beat more regularly. This medicine is used to treat high blood pressure and to prevent chest pain. It is also used to after a heart attack and to prevent an additional heart attack from occurring. This medicine may be used for other purposes; ask your health care provider or pharmacist if you have questions. What should I tell my health care provider before I take this medicine? They need to know if you have any of these conditions: -diabetes -heart or vessel disease like slow heart rate, worsening heart failure, heart block, sick sinus syndrome or Raynaud's disease -kidney disease -liver disease -lung or breathing disease, like asthma or emphysema -pheochromocytoma -thyroid disease -an unusual or allergic reaction to metoprolol, other beta-blockers, medicines, foods, dyes, or preservatives -pregnant or trying to get pregnant -breast-feeding How should I use this medicine? Take this medicine by mouth with a drink of water. Follow the directions on the prescription label. Take this medicine immediately after meals. Take your doses at regular intervals. Do not take more medicine than directed. Do not stop taking this medicine suddenly. This could lead to serious heart-related effects. Talk to your pediatrician regarding the use of this medicine in children. Special care may be needed. Overdosage: If you think you have taken too much of this medicine contact a poison control center or emergency room at once. NOTE: This medicine is only for you. Do not share this medicine with others. What if I miss a dose? If you miss a dose, take it as soon as you can. If it is almost time for your next dose, take only that dose. Do not take double or extra doses. What may interact with this medicine? Do not take this medicine with any of the following  medications: -sotalol This medicine may also interact with the following medications: -clonidine -digoxin -dobutamine -epinephrine -isoproterenol -medicine to control heart rhythm like quinidine, propafenone -medicine for depression like monoamine oxidase (MAO) inhibitors, fluoxetine, and paroxetine -medicine for high blood pressure like calcium channel blockers -reserpine This list may not describe all possible interactions. Give your health care provider a list of all the medicines, herbs, non-prescription drugs, or dietary supplements you use. Also tell them if you smoke, drink alcohol, or use illegal drugs. Some items may interact with your medicine. What should I watch for while using this medicine? Visit your doctor or health care professional for regular check ups. Contact your doctor right away if your symptoms worsen. Check your blood pressure and pulse rate regularly. Ask your health care professional what your blood pressure and pulse rate should be, and when you should contact them. You may get drowsy or dizzy. Do not drive, use machinery, or do anything that needs mental alertness until you know how this medicine affects you. Do not sit or stand up quickly, especially if you are an older patient. This reduces the risk of dizzy or fainting spells. Contact your doctor if these symptoms continue. Alcohol may interfere with the effect of this medicine. Avoid alcoholic drinks. What side effects may I notice from receiving this medicine? Side effects that you should report to your doctor or health care professional as soon as possible: -allergic reactions like skin rash, itching or hives -cold or numb hands or feet -depression -difficulty breathing -faint -fever with sore throat -irregular heartbeat, chest pain -rapid weight gain -swollen legs or ankles Side  effects that usually do not require medical attention (report to your doctor or health care professional if they continue or  are bothersome): -anxiety or nervousness -change in sex drive or performance -dry skin -headache -nightmares or trouble sleeping -short term memory loss -stomach upset or diarrhea -unusually tired This list may not describe all possible side effects. Call your doctor for medical advice about side effects. You may report side effects to FDA at 1-800-FDA-1088. Where should I keep my medicine? Keep out of the reach of children. Store at room temperature between 15 and 30 degrees C (59 and 86 degrees F). Throw away any unused medicine after the expiration date. NOTE: This sheet is a summary. It may not cover all possible information. If you have questions about this medicine, talk to your doctor, pharmacist, or health care provider.  2012, [employer]. (03/25/2007 4:11:19 PM)

## 2012-09-12 ENCOUNTER — Other Ambulatory Visit: Payer: Self-pay | Admitting: Adult Health

## 2012-09-13 ENCOUNTER — Other Ambulatory Visit: Payer: Self-pay | Admitting: *Deleted

## 2012-09-13 ENCOUNTER — Other Ambulatory Visit: Payer: Self-pay | Admitting: Certified Registered Nurse Anesthetist

## 2012-09-13 DIAGNOSIS — C50411 Malignant neoplasm of upper-outer quadrant of right female breast: Secondary | ICD-10-CM

## 2012-09-13 MED ORDER — PROCHLORPERAZINE MALEATE 10 MG PO TABS: 10.0000 mg | ORAL_TABLET | Freq: Four times a day (QID) | ORAL | Status: DC | PRN

## 2012-09-13 MED ORDER — FLUCONAZOLE 100 MG PO TABS: 200.0000 mg | ORAL_TABLET | Freq: Every day | ORAL | Status: DC

## 2012-09-15 ENCOUNTER — Other Ambulatory Visit: Payer: Self-pay | Admitting: *Deleted

## 2012-09-15 DIAGNOSIS — C50419 Malignant neoplasm of upper-outer quadrant of unspecified female breast: Secondary | ICD-10-CM

## 2012-09-16 ENCOUNTER — Other Ambulatory Visit (HOSPITAL_BASED_OUTPATIENT_CLINIC_OR_DEPARTMENT_OTHER): Payer: BC Managed Care – PPO | Admitting: Lab

## 2012-09-16 ENCOUNTER — Ambulatory Visit (HOSPITAL_BASED_OUTPATIENT_CLINIC_OR_DEPARTMENT_OTHER): Payer: BC Managed Care – PPO

## 2012-09-16 ENCOUNTER — Telehealth: Payer: Self-pay | Admitting: Oncology

## 2012-09-16 ENCOUNTER — Telehealth: Payer: Self-pay | Admitting: *Deleted

## 2012-09-16 ENCOUNTER — Ambulatory Visit (HOSPITAL_BASED_OUTPATIENT_CLINIC_OR_DEPARTMENT_OTHER): Payer: BC Managed Care – PPO | Admitting: Adult Health

## 2012-09-16 ENCOUNTER — Telehealth (INDEPENDENT_AMBULATORY_CARE_PROVIDER_SITE_OTHER): Payer: Self-pay

## 2012-09-16 VITALS — BP 120/71 | HR 111 | Temp 99.0°F | Resp 19 | Wt 238.3 lb

## 2012-09-16 DIAGNOSIS — C50419 Malignant neoplasm of upper-outer quadrant of unspecified female breast: Secondary | ICD-10-CM

## 2012-09-16 DIAGNOSIS — C50411 Malignant neoplasm of upper-outer quadrant of right female breast: Secondary | ICD-10-CM

## 2012-09-16 DIAGNOSIS — G609 Hereditary and idiopathic neuropathy, unspecified: Secondary | ICD-10-CM

## 2012-09-16 DIAGNOSIS — C7951 Secondary malignant neoplasm of bone: Secondary | ICD-10-CM

## 2012-09-16 DIAGNOSIS — Z5111 Encounter for antineoplastic chemotherapy: Secondary | ICD-10-CM

## 2012-09-16 DIAGNOSIS — R0600 Dyspnea, unspecified: Secondary | ICD-10-CM

## 2012-09-16 LAB — CBC WITH DIFFERENTIAL/PLATELET
BASO%: 0.3 % (ref 0.0–2.0)
Basophils Absolute: 0 10*3/uL (ref 0.0–0.1)
EOS%: 0.8 % (ref 0.0–7.0)
HGB: 11.6 g/dL (ref 11.6–15.9)
MCH: 35.6 pg — ABNORMAL HIGH (ref 25.1–34.0)
MONO#: 0.8 10*3/uL (ref 0.1–0.9)
RDW: 16.8 % — ABNORMAL HIGH (ref 11.2–14.5)
WBC: 8.8 10*3/uL (ref 3.9–10.3)
lymph#: 1.7 10*3/uL (ref 0.9–3.3)

## 2012-09-16 LAB — COMPREHENSIVE METABOLIC PANEL
AST: 24 U/L (ref 0–37)
Alkaline Phosphatase: 50 U/L (ref 39–117)
Calcium: 8.8 mg/dL (ref 8.4–10.5)
Glucose, Bld: 92 mg/dL (ref 70–99)
Potassium: 4.1 mEq/L (ref 3.5–5.3)
Sodium: 131 mEq/L — ABNORMAL LOW (ref 135–145)
Total Protein: 5.6 g/dL — ABNORMAL LOW (ref 6.0–8.3)

## 2012-09-16 MED ORDER — SODIUM CHLORIDE 0.9 % IV SOLN
Freq: Once | INTRAVENOUS | Status: AC
  Administered 2012-09-16: 13:00:00 via INTRAVENOUS

## 2012-09-16 MED ORDER — DEXAMETHASONE SODIUM PHOSPHATE 20 MG/5ML IJ SOLN
20.0000 mg | Freq: Once | INTRAMUSCULAR | Status: AC
  Administered 2012-09-16: 20 mg via INTRAVENOUS

## 2012-09-16 MED ORDER — HEPARIN SOD (PORK) LOCK FLUSH 100 UNIT/ML IV SOLN
500.0000 [IU] | Freq: Once | INTRAVENOUS | Status: AC | PRN
  Administered 2012-09-16: 500 [IU]
  Filled 2012-09-16: qty 5

## 2012-09-16 MED ORDER — DIPHENHYDRAMINE HCL 50 MG/ML IJ SOLN
50.0000 mg | Freq: Once | INTRAMUSCULAR | Status: AC
  Administered 2012-09-16: 50 mg via INTRAVENOUS

## 2012-09-16 MED ORDER — FAMOTIDINE IN NACL 20-0.9 MG/50ML-% IV SOLN
20.0000 mg | Freq: Once | INTRAVENOUS | Status: AC
  Administered 2012-09-16: 20 mg via INTRAVENOUS

## 2012-09-16 MED ORDER — ONDANSETRON 8 MG/50ML IVPB (CHCC)
8.0000 mg | Freq: Once | INTRAVENOUS | Status: AC
  Administered 2012-09-16: 8 mg via INTRAVENOUS

## 2012-09-16 MED ORDER — SODIUM CHLORIDE 0.9 % IJ SOLN
10.0000 mL | INTRAMUSCULAR | Status: DC | PRN
  Administered 2012-09-16: 10 mL
  Filled 2012-09-16: qty 10

## 2012-09-16 MED ORDER — PACLITAXEL CHEMO INJECTION 300 MG/50ML
80.0000 mg/m2 | Freq: Once | INTRAVENOUS | Status: AC
  Administered 2012-09-16: 168 mg via INTRAVENOUS
  Filled 2012-09-16: qty 28

## 2012-09-16 NOTE — Telephone Encounter (Signed)
, °
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
## 2012-09-16 NOTE — Telephone Encounter (Signed)
Per staff message and POF I have scheduled appts.  JMW  

## 2012-09-16 NOTE — Telephone Encounter (Signed)
Called pt to give her the appt with Dr Dwain Sarna for 9/9 arrive at 2:15/2:30.

## 2012-09-16 NOTE — Patient Instructions (Signed)
Doing well.  Proceed with chemotherapy.  Please call us if you have any questions or concerns.    

## 2012-09-16 NOTE — Patient Instructions (Addendum)
[facility] Discharge Instructions for Patients Receiving Chemotherapy  Today you received the following chemotherapy agents: Taxol  To help prevent nausea and vomiting after your treatment, we encourage you to take your nausea medication as directed by your physician.    If you develop nausea and vomiting that is not controlled by your nausea medication, call the clinic.   BELOW ARE SYMPTOMS THAT SHOULD BE REPORTED IMMEDIATELY:  *FEVER GREATER THAN 100.5 F  *CHILLS WITH OR WITHOUT FEVER  NAUSEA AND VOMITING THAT IS NOT CONTROLLED WITH YOUR NAUSEA MEDICATION  *UNUSUAL SHORTNESS OF BREATH  *UNUSUAL BRUISING OR BLEEDING  TENDERNESS IN MOUTH AND THROAT WITH OR WITHOUT PRESENCE OF ULCERS  *URINARY PROBLEMS  *BOWEL PROBLEMS  UNUSUAL RASH Items with * indicate a potential emergency and should be followed up as soon as possible.  Feel free to call the clinic you have any questions or concerns. The clinic phone number is [phone].    

## 2012-09-17 ENCOUNTER — Encounter: Payer: Self-pay | Admitting: Adult Health

## 2012-09-21 ENCOUNTER — Encounter: Payer: Self-pay | Admitting: Internal Medicine

## 2012-09-21 ENCOUNTER — Ambulatory Visit (INDEPENDENT_AMBULATORY_CARE_PROVIDER_SITE_OTHER): Payer: BC Managed Care – PPO | Admitting: Internal Medicine

## 2012-09-21 VITALS — BP 104/74 | HR 104 | Temp 98.5°F | Ht 62.0 in | Wt 237.0 lb

## 2012-09-21 DIAGNOSIS — R0989 Other specified symptoms and signs involving the circulatory and respiratory systems: Secondary | ICD-10-CM

## 2012-09-21 DIAGNOSIS — R0600 Dyspnea, unspecified: Secondary | ICD-10-CM | POA: Insufficient documentation

## 2012-09-21 DIAGNOSIS — R0609 Other forms of dyspnea: Secondary | ICD-10-CM

## 2012-09-21 DIAGNOSIS — E669 Obesity, unspecified: Secondary | ICD-10-CM

## 2012-09-21 NOTE — Progress Notes (Signed)
  Subjective:    Patient ID: Cheryl Moore, female    DOB: January 03, 1961   MRN: [id number]  HPI  52 yowf quit smoking 2004 at wt 130  dx with Stage IV breast / bones April 2014 at wt 195  Referred to pulmonary clinic by Dr Welton Flakes at wt 237 09/21/2012   09/21/2012 1st [facility] office visit/ Amon Costilla cc new fairly abrupt onset doe x 2-3 weeks first with housework and not worse since then but reproducible with picking up objects but ok walking flat at mod pace x indefinite  New orthopnea x 6  Months  No obvious daytime variabilty or assoc chronic cough or cp or chest tightness, subjective wheeze overt sinus or hb symptoms. No unusual exp hx or h/o childhood pna/ asthma or knowledge of premature birth.   Sleeping ok at 30 degrees  without nocturnal  or early am exacerbation  of respiratory  c/o's or need for noct saba. Also denies any obvious fluctuation of symptoms with weather or environmental changes or other aggravating or alleviating factors except as outlined above   Current Medications, Allergies, Past Medical History, Past Surgical History, Family History, and Social History were reviewed in [employer] record.   Review of Systems  Constitutional: Negative for fever, chills and unexpected weight change.  HENT: Negative for ear pain, nosebleeds, congestion, sore throat, rhinorrhea, sneezing, trouble swallowing, dental problem, voice change, postnasal drip and sinus pressure.   Eyes: Negative for visual disturbance.  Respiratory: Positive for shortness of breath. Negative for cough and choking.   Cardiovascular: Negative for chest pain and leg swelling.  Gastrointestinal: Negative for vomiting, abdominal pain and diarrhea.  Genitourinary: Negative for difficulty urinating.  Musculoskeletal: Negative for arthralgias.  Skin: Negative for rash.  Neurological: Negative for tremors, syncope and headaches.  Hematological: Does not bruise/bleed easily.        Objective:   Physical Exam  Very pleasant cushingnoid wf nad   Wt Readings from Last 3 Encounters:  09/21/12 237 lb (107.502 kg)  09/16/12 238 lb 5 oz (108.098 kg)  09/09/12 238 lb 1.6 oz (108.001 kg)     HEENT: nl dentition, turbinates, and orophanx. Nl external ear canals without cough reflex   NECK :  without JVD/Nodes/TM/ nl carotid upstrokes bilaterally   LUNGS: no acc muscle use, clear to A and P bilaterally without cough on insp or exp maneuvers   CV:  RRR  no s3 or murmur or increase in P2, no edema   ABD: obese  soft and nontender with nl excursion in the supine position. No bruits or organomegaly, bowel sounds nl  MS:  warm without deformities, calf tenderness, cyanosis or clubbing  SKIN: warm and dry without lesions    NEURO:  alert, approp, no deficits      09/09/12 1. No CT findings for pulmonary embolism.  2. Normal thoracic aorta.  3. Patchy areas of subsegmental bibasilar atelectasis but no  infiltrates or effusions.  4. Stable diffuse osseous metastatic disease. Less lytic change  and more sclerosis involving the 3rd right posterior rib.    Spirometry 09/21/2012  wnl      Assessment & Plan:

## 2012-09-21 NOTE — Patient Instructions (Addendum)
Pantoprazole (protonix) 40 mg   Take 30-60 min before first meal of the day and Pepcid 20 mg one bedtime until return to office - this is the best way to tell whether stomach acid is contributing to your problem.    GERD (REFLUX)  is an extremely common cause of respiratory symptoms, many times with no significant heartburn at all.    It can be treated with medication, but also with lifestyle changes including avoidance of late meals, excessive alcohol, smoking cessation, and avoid fatty foods, chocolate, peppermint, colas, red wine, and acidic juices such as orange juice.  NO MINT OR MENTHOL PRODUCTS SO NO COUGH DROPS  USE SUGARLESS CANDY INSTEAD (jolley ranchers or Stover's)  NO OIL BASED VITAMINS - use powdered substitutes.    Weight control is simply a matter of calorie balance which needs to be tilted in your favor by eating less and exercising more.  To get the most out of exercise, you need to be continuously aware that you are short of breath, but never out of breath, for 30 minutes daily. As you improve, it will actually be easier for you to do the same amount of exercise  in  30 minutes so always push to the level where you are short of breath.  If this does not result in gradual weight reduction then I strongly recommend you see a nutritionist with a food diary x 2 weeks so that we can work out a negative calorie balance which is universally effective in steady weight loss programs.  Think of your calorie balance like you do your bank account where in this case you want the balance to go down so you must take in less calories than you burn up.  It's just that simple:  Hard to do, but easy to understand.  Good luck!   You are cleared for surgery

## 2012-09-22 ENCOUNTER — Ambulatory Visit (HOSPITAL_COMMUNITY)
Admission: RE | Admit: 2012-09-22 | Discharge: 2012-09-22 | Disposition: A | Discharge status: Home / Self Care | Payer: BC Managed Care – PPO | Source: Ambulatory Visit | Attending: Adult Health | Admitting: Adult Health

## 2012-09-22 DIAGNOSIS — C773 Secondary and unspecified malignant neoplasm of axilla and upper limb lymph nodes: Secondary | ICD-10-CM | POA: Insufficient documentation

## 2012-09-22 DIAGNOSIS — C50419 Malignant neoplasm of upper-outer quadrant of unspecified female breast: Secondary | ICD-10-CM | POA: Insufficient documentation

## 2012-09-22 DIAGNOSIS — C50411 Malignant neoplasm of upper-outer quadrant of right female breast: Secondary | ICD-10-CM

## 2012-09-22 MED ORDER — GADOBENATE DIMEGLUMINE 529 MG/ML IV SOLN
20.0000 mL | Freq: Once | INTRAVENOUS | Status: AC | PRN
  Administered 2012-09-22: 20 mL via INTRAVENOUS

## 2012-09-22 NOTE — Assessment & Plan Note (Signed)
Note baseline wt 100 lbs under present wt  And tsh ok 5 months ago >   needs to start turning this problem around asap   See discussion of neg cal balance goals.

## 2012-09-22 NOTE — Assessment & Plan Note (Addendum)
09/09/12  1. No CT findings for pulmonary embolism.  2. Normal thoracic aorta.  3. Patchy areas of subsegmental bibasilar atelectasis but no  infiltrates or effusions.  - spirometry 09/21/2012 wnl  Symptoms are   disproportionate to objective findings and not clear this is a lung problem but pt does appear to have difficult airway management issues. DDX of  difficult airways managment all start with A and  include Adherence, Ace Inhibitors, Acid Reflux, Active Sinus Disease, Alpha 1 Antitripsin deficiency, Anxiety masquerading as Airways dz,  ABPA,  allergy(esp in young), Aspiration (esp in elderly), Adverse effects of DPI,  Active smokers, plus two Bs  = Bronchiectasis and Beta blocker use..and one C= CHF  ?acid reflux / obesity related > difficult to exclude and at risk > rx empirically x 4 weeks min med/diet reviewed  ? Allergies/ asthma > no change symptoms on dex so doubt strongly  ? Anxiety > always dx of exlcusion  The main problem with surgery will be post op atx which can be offset by preop wt loss, IS, early mob and min sedation but she is presently cleared for surgery   See instructions for specific recommendations which were reviewed directly with the patient who was given a copy with highlighter outlining the key components.

## 2012-09-23 ENCOUNTER — Encounter: Payer: Self-pay | Admitting: Adult Health

## 2012-09-23 ENCOUNTER — Other Ambulatory Visit (HOSPITAL_BASED_OUTPATIENT_CLINIC_OR_DEPARTMENT_OTHER): Payer: BC Managed Care – PPO | Admitting: Lab

## 2012-09-23 ENCOUNTER — Ambulatory Visit (HOSPITAL_BASED_OUTPATIENT_CLINIC_OR_DEPARTMENT_OTHER): Payer: BC Managed Care – PPO | Admitting: Adult Health

## 2012-09-23 ENCOUNTER — Ambulatory Visit (HOSPITAL_BASED_OUTPATIENT_CLINIC_OR_DEPARTMENT_OTHER): Payer: BC Managed Care – PPO

## 2012-09-23 VITALS — BP 118/74 | HR 111 | Temp 99.5°F | Resp 20 | Ht 62.0 in | Wt 239.6 lb

## 2012-09-23 DIAGNOSIS — C50419 Malignant neoplasm of upper-outer quadrant of unspecified female breast: Secondary | ICD-10-CM

## 2012-09-23 DIAGNOSIS — Z452 Encounter for adjustment and management of vascular access device: Secondary | ICD-10-CM

## 2012-09-23 DIAGNOSIS — C50411 Malignant neoplasm of upper-outer quadrant of right female breast: Secondary | ICD-10-CM

## 2012-09-23 DIAGNOSIS — K219 Gastro-esophageal reflux disease without esophagitis: Secondary | ICD-10-CM

## 2012-09-23 DIAGNOSIS — Z5111 Encounter for antineoplastic chemotherapy: Secondary | ICD-10-CM

## 2012-09-23 DIAGNOSIS — Z170 Estrogen receptor positive status [ER+]: Secondary | ICD-10-CM

## 2012-09-23 DIAGNOSIS — G609 Hereditary and idiopathic neuropathy, unspecified: Secondary | ICD-10-CM

## 2012-09-23 DIAGNOSIS — C7951 Secondary malignant neoplasm of bone: Secondary | ICD-10-CM

## 2012-09-23 DIAGNOSIS — C773 Secondary and unspecified malignant neoplasm of axilla and upper limb lymph nodes: Secondary | ICD-10-CM

## 2012-09-23 DIAGNOSIS — G4700 Insomnia, unspecified: Secondary | ICD-10-CM

## 2012-09-23 LAB — CBC WITH DIFFERENTIAL/PLATELET
BASO%: 0.3 % (ref 0.0–2.0)
Basophils Absolute: 0 10*3/uL (ref 0.0–0.1)
Eosinophils Absolute: 0 10*3/uL (ref 0.0–0.5)
HCT: 31.8 % — ABNORMAL LOW (ref 34.8–46.6)
HGB: 10.7 g/dL — ABNORMAL LOW (ref 11.6–15.9)
LYMPH%: 19.5 % (ref 14.0–49.7)
MCHC: 33.6 g/dL (ref 31.5–36.0)
MONO#: 0.5 10*3/uL (ref 0.1–0.9)
NEUT#: 5.5 10*3/uL (ref 1.5–6.5)
NEUT%: 73.2 % (ref 38.4–76.8)
Platelets: 279 10*3/uL (ref 145–400)
WBC: 7.5 10*3/uL (ref 3.9–10.3)
lymph#: 1.5 10*3/uL (ref 0.9–3.3)

## 2012-09-23 LAB — TECHNOLOGIST REVIEW

## 2012-09-23 LAB — COMPREHENSIVE METABOLIC PANEL (CC13)
ALT: 43 U/L (ref 0–55)
BUN: 11 mg/dL (ref 7.0–26.0)
CO2: 17 mEq/L — ABNORMAL LOW (ref 22–29)
Calcium: 8.4 mg/dL (ref 8.4–10.4)
Chloride: 106 mEq/L (ref 98–109)
Creatinine: 0.7 mg/dL (ref 0.6–1.1)
Glucose: 150 mg/dl — ABNORMAL HIGH (ref 70–140)
Total Bilirubin: 0.39 mg/dL (ref 0.20–1.20)

## 2012-09-23 MED ORDER — ALTEPLASE 2 MG IJ SOLR
2.0000 mg | Freq: Once | INTRAMUSCULAR | Status: AC | PRN
  Administered 2012-09-23: 2 mg
  Filled 2012-09-23: qty 2

## 2012-09-23 MED ORDER — DIPHENHYDRAMINE HCL 50 MG/ML IJ SOLN
50.0000 mg | Freq: Once | INTRAMUSCULAR | Status: AC
  Administered 2012-09-23: 50 mg via INTRAVENOUS

## 2012-09-23 MED ORDER — SODIUM CHLORIDE 0.9 % IV SOLN
Freq: Once | INTRAVENOUS | Status: AC
  Administered 2012-09-23: 13:00:00 via INTRAVENOUS

## 2012-09-23 MED ORDER — SODIUM CHLORIDE 0.9 % IJ SOLN
10.0000 mL | INTRAMUSCULAR | Status: DC | PRN
  Administered 2012-09-23: 10 mL
  Filled 2012-09-23: qty 10

## 2012-09-23 MED ORDER — DEXAMETHASONE SODIUM PHOSPHATE 20 MG/5ML IJ SOLN
20.0000 mg | Freq: Once | INTRAMUSCULAR | Status: AC
  Administered 2012-09-23: 20 mg via INTRAVENOUS

## 2012-09-23 MED ORDER — PANTOPRAZOLE SODIUM 40 MG PO TBEC: 40.0000 mg | DELAYED_RELEASE_TABLET | Freq: Every day | ORAL | Status: DC

## 2012-09-23 MED ORDER — ONDANSETRON 8 MG/50ML IVPB (CHCC)
8.0000 mg | Freq: Once | INTRAVENOUS | Status: AC
  Administered 2012-09-23: 8 mg via INTRAVENOUS

## 2012-09-23 MED ORDER — DEXTROSE 5 % IV SOLN
80.0000 mg/m2 | Freq: Once | INTRAVENOUS | Status: AC
  Administered 2012-09-23: 168 mg via INTRAVENOUS
  Filled 2012-09-23: qty 28

## 2012-09-23 MED ORDER — FAMOTIDINE IN NACL 20-0.9 MG/50ML-% IV SOLN
20.0000 mg | Freq: Once | INTRAVENOUS | Status: AC
  Administered 2012-09-23: 20 mg via INTRAVENOUS

## 2012-09-23 MED ORDER — HEPARIN SOD (PORK) LOCK FLUSH 100 UNIT/ML IV SOLN
500.0000 [IU] | Freq: Once | INTRAVENOUS | Status: AC | PRN
  Administered 2012-09-23: 500 [IU]
  Filled 2012-09-23: qty 5
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
## 2012-09-23 NOTE — Patient Instructions (Addendum)
Doing well.  I prescribed the Protonix for you to take daily.  Take as directed by Dr. Sherene Sires.  Continue the Neurontin daily.  Stop taking the lasix.  We will see you back next week.  Proceed with chemotherapy.

## 2012-09-23 NOTE — Progress Notes (Signed)
1155--port accessed, flushed with normal saline, no blood return noted.  Cathflo administered at 1205.    1245--positive blood return noted, 10cc blood drawn back and discarded.  SLJ

## 2012-09-23 NOTE — Patient Instructions (Addendum)
[facility] Discharge Instructions for Patients Receiving Chemotherapy  Today you received the following chemotherapy agents taxol  To help prevent nausea and vomiting after your treatment, we encourage you to take your nausea medication as needed.   If you develop nausea and vomiting that is not controlled by your nausea medication, call the clinic.   BELOW ARE SYMPTOMS THAT SHOULD BE REPORTED IMMEDIATELY:  *FEVER GREATER THAN 100.5 F  *CHILLS WITH OR WITHOUT FEVER  NAUSEA AND VOMITING THAT IS NOT CONTROLLED WITH YOUR NAUSEA MEDICATION  *UNUSUAL SHORTNESS OF BREATH  *UNUSUAL BRUISING OR BLEEDING  TENDERNESS IN MOUTH AND THROAT WITH OR WITHOUT PRESENCE OF ULCERS  *URINARY PROBLEMS  *BOWEL PROBLEMS  UNUSUAL RASH Items with * indicate a potential emergency and should be followed up as soon as possible.  Feel free to call the clinic you have any questions or concerns. The clinic phone number is [phone].    

## 2012-09-28 ENCOUNTER — Other Ambulatory Visit: Payer: Self-pay | Admitting: *Deleted

## 2012-09-28 DIAGNOSIS — C50411 Malignant neoplasm of upper-outer quadrant of right female breast: Secondary | ICD-10-CM

## 2012-09-28 MED ORDER — DEXAMETHASONE 4 MG PO TABS: ORAL_TABLET | ORAL | Status: DC

## 2012-09-30 ENCOUNTER — Ambulatory Visit: Payer: BC Managed Care – PPO

## 2012-09-30 ENCOUNTER — Ambulatory Visit (HOSPITAL_BASED_OUTPATIENT_CLINIC_OR_DEPARTMENT_OTHER): Payer: BC Managed Care – PPO | Admitting: Adult Health

## 2012-09-30 ENCOUNTER — Encounter: Payer: Self-pay | Admitting: Adult Health

## 2012-09-30 ENCOUNTER — Other Ambulatory Visit (HOSPITAL_BASED_OUTPATIENT_CLINIC_OR_DEPARTMENT_OTHER): Payer: BC Managed Care – PPO | Admitting: Lab

## 2012-09-30 VITALS — BP 122/79 | HR 123 | Temp 98.2°F | Resp 22 | Ht 62.0 in | Wt 238.8 lb

## 2012-09-30 DIAGNOSIS — R21 Rash and other nonspecific skin eruption: Secondary | ICD-10-CM

## 2012-09-30 DIAGNOSIS — C7951 Secondary malignant neoplasm of bone: Secondary | ICD-10-CM

## 2012-09-30 DIAGNOSIS — C773 Secondary and unspecified malignant neoplasm of axilla and upper limb lymph nodes: Secondary | ICD-10-CM

## 2012-09-30 DIAGNOSIS — C50919 Malignant neoplasm of unspecified site of unspecified female breast: Secondary | ICD-10-CM

## 2012-09-30 DIAGNOSIS — R000 Tachycardia, unspecified: Secondary | ICD-10-CM

## 2012-09-30 DIAGNOSIS — G4700 Insomnia, unspecified: Secondary | ICD-10-CM

## 2012-09-30 DIAGNOSIS — G589 Mononeuropathy, unspecified: Secondary | ICD-10-CM

## 2012-09-30 DIAGNOSIS — C50411 Malignant neoplasm of upper-outer quadrant of right female breast: Secondary | ICD-10-CM

## 2012-09-30 DIAGNOSIS — Z5111 Encounter for antineoplastic chemotherapy: Secondary | ICD-10-CM

## 2012-09-30 LAB — CBC WITH DIFFERENTIAL/PLATELET
BASO%: 0.6 % (ref 0.0–2.0)
LYMPH%: 23.9 % (ref 14.0–49.7)
MCHC: 33.6 g/dL (ref 31.5–36.0)
MCV: 101.9 fL — ABNORMAL HIGH (ref 79.5–101.0)
MONO#: 0.5 10*3/uL (ref 0.1–0.9)
MONO%: 6.9 % (ref 0.0–14.0)
Platelets: 290 10*3/uL (ref 145–400)
RBC: 3.15 10*6/uL — ABNORMAL LOW (ref 3.70–5.45)
RDW: 16.6 % — ABNORMAL HIGH (ref 11.2–14.5)
WBC: 6.7 10*3/uL (ref 3.9–10.3)
nRBC: 4 % — ABNORMAL HIGH (ref 0–0)

## 2012-09-30 LAB — TECHNOLOGIST REVIEW

## 2012-09-30 MED ORDER — PREDNISONE (PAK) 10 MG PO TABS: 10.0000 mg | ORAL_TABLET | ORAL | Status: DC

## 2012-09-30 MED ORDER — GOSERELIN ACETATE 3.6 MG ~~LOC~~ IMPL
3.6000 mg | DRUG_IMPLANT | SUBCUTANEOUS | Status: DC
  Administered 2012-09-30: 3.6 mg via SUBCUTANEOUS
  Filled 2012-09-30: qty 3.6

## 2012-09-30 MED ORDER — DENOSUMAB 120 MG/1.7ML ~~LOC~~ SOLN
120.0000 mg | Freq: Once | SUBCUTANEOUS | Status: AC
  Administered 2012-09-30: 120 mg via SUBCUTANEOUS
  Filled 2012-09-30: qty 1.7
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
## 2012-09-30 NOTE — Patient Instructions (Signed)
Take Steroid taper as directed.  You have finished with chemotherapy.  We will see you back next week.  Please call us if you have any questions or concerns.

## 2012-10-04 ENCOUNTER — Ambulatory Visit (INDEPENDENT_AMBULATORY_CARE_PROVIDER_SITE_OTHER): Payer: BC Managed Care – PPO | Admitting: General Surgery

## 2012-10-04 ENCOUNTER — Encounter (INDEPENDENT_AMBULATORY_CARE_PROVIDER_SITE_OTHER): Payer: Self-pay | Admitting: General Surgery

## 2012-10-04 VITALS — BP 128/82 | HR 84 | Resp 18 | Ht 62.0 in | Wt 242.0 lb

## 2012-10-04 DIAGNOSIS — C50411 Malignant neoplasm of upper-outer quadrant of right female breast: Secondary | ICD-10-CM

## 2012-10-04 DIAGNOSIS — C50419 Malignant neoplasm of upper-outer quadrant of unspecified female breast: Secondary | ICD-10-CM

## 2012-10-04 NOTE — Progress Notes (Signed)
Patient ID: Cheryl Moore, female   DOB: Nov 03, 1960, 52 y.o.   MRN: [id number]  Chief Complaint  Patient presents with  . Follow-up    Discuss breast surgery    HPI Cheryl Moore is a 52 y.o. female.   HPI 77 yof who presented in 4/14 with what ended up being a right breast inflammatory cancer proven by punch biopsy and a pet scan that shows bony metastatic disease.  Her MR had an area of ill defined mass measuring 6.1x3.9x3.6 cm  Biopsy was invasive ductal carcinoma that is er/pr positive, her 2 not amplified.  Her node was positive then also.  She has undergone primary systemic therapy via left sided port I placed.  She finished 10 days ago.  She has had trouble with shortness of breath for which she has seen Dr Sherene Sires.  She also had some neuropathy but also a significant rash that led to cessation of taxol a little early.  She is just starting to feel better after finishing chemotherapy. She desires bilateral mastectomies and comes in today to discuss.Recent repeat MR shows left breast to be normal, right axillary nodes are much smaller with largest being 1.2x1.1 cm in size.  The skin thickening in right breast is better and the area of enhancement in right breast are smaller.    Past Medical History  Diagnosis Date  . Breast cancer   . Anxiety   . Hot flashes     Past Surgical History  Procedure Laterality Date  . Cesarean section  2005  . Breast surgery Right     breast bx  . Breast biopsy Right 05/23/2012    Procedure: SKIN PUNCH BIOPSY RIGHT BREAST;  Surgeon: Emelia Loron, MD;  Location: [facility] OR;  Service: General;  Laterality: Right;  . Portacath placement Left 05/23/2012    Procedure: INSERTION PORT-A-CATH;  Surgeon: Emelia Loron, MD;  Location: [facility] OR;  Service: General;  Laterality: Left;    Family History  Problem Relation Age of Onset  . Adopted: Yes    Social History History  Substance Use Topics  . Smoking status: Former Smoker -- 0.25 packs/day for 5 years      Types: Cigarettes    Quit date: 05/21/2002  . Smokeless tobacco: Never Used  . Alcohol Use: Yes     Comment: 1-2 per month    Allergies  Allergen Reactions  . Penicillins Swelling    "Throat swells shut"    Current Outpatient Prescriptions  Medication Sig Dispense Refill  . ALPRAZolam (XANAX) 0.25 MG tablet Take 1 tablet (0.25 mg total) by mouth 3 (three) times daily as needed for anxiety.  30 tablet  3  . Alum & Mag Hydroxide-Simeth (MAGIC MOUTHWASH W/LIDOCAINE) SOLN Take 5 mLs by mouth 4 (four) times daily as needed.      . Ascorbic Acid (VITAMIN C PO) Take 1 tablet by mouth daily.      Marland Kitchen CALCIUM PO Take 1 tablet by mouth daily.      Marland Kitchen dexamethasone (DECADRON) 4 MG tablet TAKE 2 TABS TWICE A DAY FOR 3 DAYS AFTER CHEMO.  30 tablet  0  . fluconazole (DIFLUCAN) 100 MG tablet Take 2 tablets (200 mg total) by mouth daily.  5 tablet  2  . furosemide (LASIX) 20 MG tablet Take 1 tablet (20 mg total) by mouth daily.  30 tablet  0  . gabapentin (NEURONTIN) 100 MG capsule TAKE 1 CAPSULE (100 MG TOTAL) BY MOUTH 3 (THREE) TIMES DAILY.  90  capsule  0  . ibuprofen (ADVIL,MOTRIN) 200 MG tablet Take 200-400 mg by mouth every 8 (eight) hours as needed for pain.       Marland Kitchen lidocaine-prilocaine (EMLA) cream Apply topically as needed.  30 g  6  . LORazepam (ATIVAN) 0.5 MG tablet Take 1 tablet (0.5 mg total) by mouth every 8 (eight) hours. For nausea / vomiting  60 tablet  3  . metoprolol tartrate (LOPRESSOR) 25 MG tablet Take 0.5 tablets (12.5 mg total) by mouth 2 (two) times daily.  60 tablet  0  . Multiple Vitamin (MULTIVITAMIN) tablet Take 1 tablet by mouth daily.      Marland Kitchen nystatin (MYCOSTATIN) 100000 UNIT/ML suspension Take 5 mLs (500,000 Units total) by mouth 4 (four) times daily.  180 mL  6  . ondansetron (ZOFRAN) 8 MG tablet Take 8 mg by mouth every 8 (eight) hours as needed for nausea.      Marland Kitchen oxyCODONE-acetaminophen (ROXICET) 5-325 MG per tablet Take 1 tablet by mouth every 4 (four) hours as needed  for pain.  20 tablet  0  . pantoprazole (PROTONIX) 40 MG tablet Take 1 tablet (40 mg total) by mouth daily.  30 tablet  3  . potassium chloride SA (K-DUR,KLOR-CON) 20 MEQ tablet Take 1 tablet (20 mEq total) by mouth daily.  30 tablet  0  . predniSONE (STERAPRED UNI-PAK) 10 MG tablet Take 1 tablet (10 mg total) by mouth as directed. 60mg  pox 1day,50mg  po x1 day,40mg  po x1 day,30mg  po x1 day,20mg  po x1 day,10mg  po x1 day.  21 tablet  0  . prochlorperazine (COMPAZINE) 10 MG tablet Take 1 tablet (10 mg total) by mouth every 6 (six) hours as needed.  30 tablet  2  . valACYclovir (VALTREX) 500 MG tablet Take 1 tablet (500 mg total) by mouth daily.  30 tablet  5   No current facility-administered medications for this visit.    Review of Systems Review of Systems  Constitutional: Negative for fever, chills and unexpected weight change.  HENT: Negative for hearing loss, congestion, sore throat, trouble swallowing and voice change.   Eyes: Negative for visual disturbance.  Respiratory: Positive for shortness of breath. Negative for cough and wheezing.   Cardiovascular: Negative for chest pain, palpitations and leg swelling.  Gastrointestinal: Negative for nausea, vomiting, abdominal pain, diarrhea, constipation, blood in stool, abdominal distention and anal bleeding.  Genitourinary: Negative for hematuria, vaginal bleeding and difficulty urinating.  Musculoskeletal: Negative for arthralgias.  Skin: Negative for rash and wound.  Neurological: Negative for seizures, syncope and headaches.  Hematological: Negative for adenopathy. Does not bruise/bleed easily.  Psychiatric/Behavioral: Negative for confusion.    Blood pressure 128/82, pulse 84, resp. rate 18, height 5\' 2"  (1.575 m), weight 242 lb (109.77 kg).  Physical Exam Physical Exam  Vitals reviewed. Constitutional: She appears well-developed and well-nourished.  Neck: Neck supple.  Cardiovascular: Normal rate, regular rhythm and normal heart  sounds.   Pulmonary/Chest: Effort normal and breath sounds normal. She has no wheezes. She has no rales. Right breast exhibits mass and skin change. Right breast exhibits no inverted nipple, no nipple discharge and no tenderness. Left breast exhibits no inverted nipple, no mass, no nipple discharge, no skin change and no tenderness.    Abdominal: Soft.  Lymphadenopathy:    She has no cervical adenopathy.    She has no axillary adenopathy.       Right: No supraclavicular adenopathy present.       Left: No supraclavicular adenopathy present.  Assessment    Inflammatory right breast cancer,stage IV    Plan    Right MRM Left total mastectomy  She has stage IV disease with initial inflammatory cancer and I don't know if surgery will improve her survival. She appears to have a good response to treatment so far and has bone only mets with hormone receptor positive tumor meaning this disease could be managed for long term.  I think reasonable to consider surgery on the right with mastectomy and alnd removing remaining large nodes.  She very much desires having the left side removed for symmetry and her own piece of mind.  I told her several times that this will not affect her survival and could indeed delay next step of treatment if there is complication on this side.  She is adamant that we do bilateral surgery. I have discussed reconstruction and referral to plastic surgery but she does not want to pursue this.  After long discussion we will proceed with bilateral mastectomies.  I am also concerned about longer time under anesthetic for her. We discussed risks of surgery being, but not limited to, bleeding, reoperation, infection wound dehiscence, flap death, dvt/pe, pna/uti/cardiac complications.  I also told her neither side would be completely flat given her habitus.  We discussed long term risk of lymphedema and chronic shoulder issues on the right.  I think due to her current state waiting  another 2-3 weeks will help reduce some of these complications also.        Teodora Baumgarten 10/04/2012, 10:31 PM

## 2012-10-07 ENCOUNTER — Encounter: Payer: Self-pay | Admitting: *Deleted

## 2012-10-07 ENCOUNTER — Ambulatory Visit: Payer: BC Managed Care – PPO

## 2012-10-07 ENCOUNTER — Telehealth: Payer: Self-pay | Admitting: Oncology

## 2012-10-07 ENCOUNTER — Ambulatory Visit (HOSPITAL_BASED_OUTPATIENT_CLINIC_OR_DEPARTMENT_OTHER): Payer: BC Managed Care – PPO | Admitting: Adult Health

## 2012-10-07 ENCOUNTER — Other Ambulatory Visit (HOSPITAL_BASED_OUTPATIENT_CLINIC_OR_DEPARTMENT_OTHER): Payer: BC Managed Care – PPO | Admitting: Lab

## 2012-10-07 ENCOUNTER — Encounter: Payer: Self-pay | Admitting: Adult Health

## 2012-10-07 VITALS — BP 139/71 | HR 112 | Temp 98.3°F | Resp 20 | Ht 62.0 in | Wt 244.1 lb

## 2012-10-07 DIAGNOSIS — C50419 Malignant neoplasm of upper-outer quadrant of unspecified female breast: Secondary | ICD-10-CM

## 2012-10-07 DIAGNOSIS — C50411 Malignant neoplasm of upper-outer quadrant of right female breast: Secondary | ICD-10-CM

## 2012-10-07 LAB — CBC WITH DIFFERENTIAL/PLATELET
BASO%: 1.1 % (ref 0.0–2.0)
EOS%: 1.4 % (ref 0.0–7.0)
MCH: 34 pg (ref 25.1–34.0)
MCHC: 33.3 g/dL (ref 31.5–36.0)
NEUT%: 51.7 % (ref 38.4–76.8)
RBC: 3.2 10*6/uL — ABNORMAL LOW (ref 3.70–5.45)
RDW: 17.2 % — ABNORMAL HIGH (ref 11.2–14.5)
lymph#: 1.9 10*3/uL (ref 0.9–3.3)

## 2012-10-07 LAB — COMPREHENSIVE METABOLIC PANEL (CC13)
ALT: 41 U/L (ref 0–55)
AST: 22 U/L (ref 5–34)
Calcium: 8.5 mg/dL (ref 8.4–10.4)
Chloride: 108 mEq/L (ref 98–109)
Creatinine: 0.9 mg/dL (ref 0.6–1.1)
Potassium: 3.9 mEq/L (ref 3.5–5.1)
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
## 2012-10-07 NOTE — Patient Instructions (Addendum)
Doing well.  Labs are stable.  We will see you back on 10/24/12.

## 2012-10-07 NOTE — Telephone Encounter (Signed)
, °

## 2012-10-13 ENCOUNTER — Other Ambulatory Visit: Payer: Self-pay | Admitting: Adult Health

## 2012-10-13 ENCOUNTER — Encounter (HOSPITAL_COMMUNITY): Payer: Self-pay

## 2012-10-14 ENCOUNTER — Ambulatory Visit: Payer: BC Managed Care – PPO | Admitting: Adult Health

## 2012-10-14 ENCOUNTER — Other Ambulatory Visit: Payer: BC Managed Care – PPO | Admitting: Lab

## 2012-10-17 ENCOUNTER — Telehealth: Payer: Self-pay | Admitting: Medical Oncology

## 2012-10-17 ENCOUNTER — Encounter (HOSPITAL_COMMUNITY): Payer: Self-pay

## 2012-10-17 ENCOUNTER — Encounter (HOSPITAL_COMMUNITY)
Admission: RE | Admit: 2012-10-17 | Discharge: 2012-10-17 | Disposition: A | Discharge status: Home / Self Care | Payer: BC Managed Care – PPO | Source: Ambulatory Visit | Attending: General Surgery | Admitting: General Surgery

## 2012-10-17 LAB — CBC WITH DIFFERENTIAL/PLATELET
Basophils Absolute: 0 10*3/uL (ref 0.0–0.1)
Eosinophils Absolute: 0.1 10*3/uL (ref 0.0–0.7)
HCT: 36.5 % (ref 36.0–46.0)
Lymphs Abs: 2.6 10*3/uL (ref 0.7–4.0)
MCH: 33 pg (ref 26.0–34.0)
MCHC: 32.6 g/dL (ref 30.0–36.0)
Monocytes Absolute: 0.8 10*3/uL (ref 0.1–1.0)
Neutrophils Relative %: 51 % (ref 43–77)
Platelets: 292 10*3/uL (ref 150–400)
RBC: 3.61 MIL/uL — ABNORMAL LOW (ref 3.87–5.11)
RDW: 16.2 % — ABNORMAL HIGH (ref 11.5–15.5)
WBC: 7.1 10*3/uL (ref 4.0–10.5)

## 2012-10-17 LAB — BASIC METABOLIC PANEL
Calcium: 9.4 mg/dL (ref 8.4–10.5)
Creatinine, Ser: 0.7 mg/dL (ref 0.50–1.10)
GFR calc non Af Amer: >90 mL/min (ref >90)
Sodium: 140 mEq/L (ref 135–145)

## 2012-10-17 NOTE — Pre-Procedure Instructions (Signed)
Cheryl Moore  10/17/2012   Your procedure is scheduled on:  Wednesday, October 1st   Report to [facility] at 6:30 AM.  Call this number if you have problems the morning of surgery: [phone]   Remember:   Do not eat food or drink liquids after midnight Tuesday.   Take these medicines the morning of surgery with A SIP OF WATER: Xanax, Gabapentin, Metoprolol, Protonix   Do not wear jewelry, make-up or nail polish.  Do not wear lotions, powders, or perfumes. You may NOT wear deodorant.  Do not shave underarms & legs 48 hours prior to surgery.    Do not bring valuables to the hospital.  [facility] is not responsible for any belongings or valuables.  Contacts, dentures or bridgework may not be worn into surgery.   Leave suitcase in the car. After surgery it may be brought to your room.  For patients admitted to the hospital, checkout time is 11:00 AM the day of discharge.   Name and phone number of your driver:    Special Instructions: Shower using CHG 2 nights before surgery and the night before surgery.  If you shower the day of surgery use CHG.  Use special wash - you have one bottle of CHG for all showers.  You should use approximately 1/3 of the bottle for each shower.   Please read over the following fact sheets that you were given: Pain Booklet, Coughing and Deep Breathing and Surgical Site Infection Prevention

## 2012-10-17 NOTE — Telephone Encounter (Signed)
Patient called stating woke up this am with both feet swollen difficulty with wearing shoes, states no other symptoms. Reviewed with NP, patient to see L. Lyn Hollingshead, NP at 1610 09/23, patient confirmed.  Onc tx sent.

## 2012-10-18 ENCOUNTER — Telehealth: Payer: Self-pay | Admitting: *Deleted

## 2012-10-18 ENCOUNTER — Ambulatory Visit (HOSPITAL_BASED_OUTPATIENT_CLINIC_OR_DEPARTMENT_OTHER): Payer: BC Managed Care – PPO | Admitting: Adult Health

## 2012-10-18 ENCOUNTER — Ambulatory Visit (HOSPITAL_COMMUNITY)
Admission: RE | Admit: 2012-10-18 | Discharge: 2012-10-18 | Disposition: A | Discharge status: Home / Self Care | Payer: BC Managed Care – PPO | Source: Ambulatory Visit | Attending: Internal Medicine | Admitting: Internal Medicine

## 2012-10-18 ENCOUNTER — Encounter: Payer: Self-pay | Admitting: Adult Health

## 2012-10-18 VITALS — BP 138/80 | HR 102 | Wt 246.5 lb

## 2012-10-18 VITALS — BP 139/70 | HR 106 | Temp 98.4°F | Resp 20 | Ht 62.0 in | Wt 246.7 lb

## 2012-10-18 DIAGNOSIS — R600 Localized edema: Secondary | ICD-10-CM

## 2012-10-18 DIAGNOSIS — C773 Secondary and unspecified malignant neoplasm of axilla and upper limb lymph nodes: Secondary | ICD-10-CM

## 2012-10-18 DIAGNOSIS — R0989 Other specified symptoms and signs involving the circulatory and respiratory systems: Secondary | ICD-10-CM | POA: Insufficient documentation

## 2012-10-18 DIAGNOSIS — C50411 Malignant neoplasm of upper-outer quadrant of right female breast: Secondary | ICD-10-CM

## 2012-10-18 DIAGNOSIS — G4700 Insomnia, unspecified: Secondary | ICD-10-CM

## 2012-10-18 DIAGNOSIS — C7951 Secondary malignant neoplasm of bone: Secondary | ICD-10-CM

## 2012-10-18 DIAGNOSIS — R609 Edema, unspecified: Secondary | ICD-10-CM

## 2012-10-18 DIAGNOSIS — G5690 Unspecified mononeuropathy of unspecified upper limb: Secondary | ICD-10-CM

## 2012-10-18 DIAGNOSIS — C50319 Malignant neoplasm of lower-inner quadrant of unspecified female breast: Secondary | ICD-10-CM

## 2012-10-18 DIAGNOSIS — M7989 Other specified soft tissue disorders: Secondary | ICD-10-CM

## 2012-10-18 DIAGNOSIS — R0609 Other forms of dyspnea: Secondary | ICD-10-CM | POA: Insufficient documentation

## 2012-10-18 DIAGNOSIS — R0600 Dyspnea, unspecified: Secondary | ICD-10-CM

## 2012-10-18 MED ORDER — LORAZEPAM 0.5 MG PO TABS: 0.5000 mg | ORAL_TABLET | Freq: Three times a day (TID) | ORAL | Status: DC

## 2012-10-18 MED ORDER — FUROSEMIDE 20 MG PO TABS: 20.0000 mg | ORAL_TABLET | Freq: Every day | ORAL | Status: DC

## 2012-10-18 NOTE — Patient Instructions (Addendum)
Compression stockings  Your physician recommends that you schedule a follow-up appointment in: 2-3 weeks

## 2012-10-18 NOTE — Telephone Encounter (Signed)
appts made and printed. droppler scheduled @ [facility] due to pt request she need an evening appt...td
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
## 2012-10-18 NOTE — Patient Instructions (Signed)
Take lasix and potassium daily.  Weigh yourself daily and keep records of your weights.  Please call us if you have any questions or concerns.
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
## 2012-10-19 ENCOUNTER — Other Ambulatory Visit (INDEPENDENT_AMBULATORY_CARE_PROVIDER_SITE_OTHER): Payer: Self-pay | Admitting: *Deleted

## 2012-10-19 ENCOUNTER — Ambulatory Visit (HOSPITAL_COMMUNITY)
Admission: RE | Admit: 2012-10-19 | Discharge: 2012-10-19 | Disposition: A | Discharge status: Home / Self Care | Payer: BC Managed Care – PPO | Source: Ambulatory Visit | Attending: Oncology | Admitting: Oncology

## 2012-10-19 DIAGNOSIS — R600 Localized edema: Secondary | ICD-10-CM

## 2012-10-19 DIAGNOSIS — R609 Edema, unspecified: Secondary | ICD-10-CM

## 2012-10-19 DIAGNOSIS — M7989 Other specified soft tissue disorders: Secondary | ICD-10-CM | POA: Insufficient documentation

## 2012-10-19 DIAGNOSIS — C50919 Malignant neoplasm of unspecified site of unspecified female breast: Secondary | ICD-10-CM

## 2012-10-19 MED ORDER — UNABLE TO FIND: Status: DC

## 2012-10-19 NOTE — Progress Notes (Signed)
*  PRELIMINARY RESULTS* Vascular Ultrasound Lower extremity venous duplex has been completed.  Preliminary findings: negative for DVT and baker's cyst.  Called report to Augustin Schooling, NP.    Farrel Demark, RDMS, RVT  10/19/2012, 2:28 PM

## 2012-10-21 ENCOUNTER — Other Ambulatory Visit: Payer: BC Managed Care – PPO | Admitting: Lab

## 2012-10-21 ENCOUNTER — Ambulatory Visit: Payer: BC Managed Care – PPO | Admitting: Adult Health

## 2012-10-24 ENCOUNTER — Ambulatory Visit (HOSPITAL_BASED_OUTPATIENT_CLINIC_OR_DEPARTMENT_OTHER): Payer: BC Managed Care – PPO

## 2012-10-24 ENCOUNTER — Encounter: Payer: Self-pay | Admitting: Oncology

## 2012-10-24 ENCOUNTER — Other Ambulatory Visit (HOSPITAL_BASED_OUTPATIENT_CLINIC_OR_DEPARTMENT_OTHER): Payer: BC Managed Care – PPO | Admitting: Lab

## 2012-10-24 ENCOUNTER — Ambulatory Visit (HOSPITAL_BASED_OUTPATIENT_CLINIC_OR_DEPARTMENT_OTHER): Payer: BC Managed Care – PPO | Admitting: Oncology

## 2012-10-24 VITALS — BP 135/80 | HR 121 | Temp 98.3°F | Resp 20 | Ht 62.0 in | Wt 242.5 lb

## 2012-10-24 DIAGNOSIS — C773 Secondary and unspecified malignant neoplasm of axilla and upper limb lymph nodes: Secondary | ICD-10-CM

## 2012-10-24 DIAGNOSIS — C7951 Secondary malignant neoplasm of bone: Secondary | ICD-10-CM

## 2012-10-24 DIAGNOSIS — Z5111 Encounter for antineoplastic chemotherapy: Secondary | ICD-10-CM

## 2012-10-24 DIAGNOSIS — C50419 Malignant neoplasm of upper-outer quadrant of unspecified female breast: Secondary | ICD-10-CM

## 2012-10-24 DIAGNOSIS — G609 Hereditary and idiopathic neuropathy, unspecified: Secondary | ICD-10-CM

## 2012-10-24 DIAGNOSIS — C50411 Malignant neoplasm of upper-outer quadrant of right female breast: Secondary | ICD-10-CM

## 2012-10-24 LAB — COMPREHENSIVE METABOLIC PANEL (CC13)
ALT: 39 U/L (ref 0–55)
Albumin: 3.3 g/dL — ABNORMAL LOW (ref 3.5–5.0)
CO2: 20 mEq/L — ABNORMAL LOW (ref 22–29)
Calcium: 9.5 mg/dL (ref 8.4–10.4)
Chloride: 108 mEq/L (ref 98–109)
Glucose: 206 mg/dl — ABNORMAL HIGH (ref 70–140)
Potassium: 3.8 mEq/L (ref 3.5–5.1)
Sodium: 140 mEq/L (ref 136–145)
Total Protein: 6.6 g/dL (ref 6.4–8.3)

## 2012-10-24 LAB — CBC WITH DIFFERENTIAL/PLATELET
BASO%: 1.4 % (ref 0.0–2.0)
Eosinophils Absolute: 0.2 10*3/uL (ref 0.0–0.5)
LYMPH%: 42.6 % (ref 14.0–49.7)
MCHC: 34.1 g/dL (ref 31.5–36.0)
MONO#: 0.8 10*3/uL (ref 0.1–0.9)
NEUT#: 3.6 10*3/uL (ref 1.5–6.5)
Platelets: 368 10*3/uL (ref 145–400)
RBC: 3.77 10*6/uL (ref 3.70–5.45)
RDW: 15.6 % — ABNORMAL HIGH (ref 11.2–14.5)
WBC: 8.3 10*3/uL (ref 3.9–10.3)
lymph#: 3.5 10*3/uL — ABNORMAL HIGH (ref 0.9–3.3)

## 2012-10-24 MED ORDER — DENOSUMAB 120 MG/1.7ML ~~LOC~~ SOLN
120.0000 mg | Freq: Once | SUBCUTANEOUS | Status: AC
  Administered 2012-10-24: 120 mg via SUBCUTANEOUS
  Filled 2012-10-24: qty 1.7

## 2012-10-24 MED ORDER — GOSERELIN ACETATE 3.6 MG ~~LOC~~ IMPL
3.6000 mg | DRUG_IMPLANT | SUBCUTANEOUS | Status: DC
  Administered 2012-10-24: 3.6 mg via SUBCUTANEOUS
  Filled 2012-10-24: qty 3.6

## 2012-10-24 MED ORDER — GOSERELIN ACETATE 3.6 MG ~~LOC~~ IMPL
3.6000 mg | DRUG_IMPLANT | SUBCUTANEOUS | Status: DC
  Filled 2012-10-24: qty 3.6
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
## 2012-10-25 MED ORDER — VANCOMYCIN HCL 10 G IV SOLR
1500.0000 mg | INTRAVENOUS | Status: AC
  Administered 2012-10-26: 1500 mg via INTRAVENOUS
  Filled 2012-10-25: qty 1500

## 2012-10-25 MED ORDER — VANCOMYCIN HCL IN DEXTROSE 1-5 GM/200ML-% IV SOLN: 1000.0000 mg | INTRAVENOUS | Status: DC

## 2012-10-26 ENCOUNTER — Encounter (HOSPITAL_COMMUNITY)
Admission: RE | Disposition: A | Discharge status: Home / Self Care | Payer: Self-pay | Source: Ambulatory Visit | Attending: General Surgery

## 2012-10-26 ENCOUNTER — Encounter (HOSPITAL_COMMUNITY): Payer: Self-pay | Admitting: *Deleted

## 2012-10-26 ENCOUNTER — Inpatient Hospital Stay (HOSPITAL_COMMUNITY)
Admission: RE | Admit: 2012-10-26 | Discharge: 2012-10-28 | DRG: 257 | Disposition: A | Discharge status: Home / Self Care | Payer: BC Managed Care – PPO | Source: Ambulatory Visit | Attending: General Surgery | Admitting: General Surgery

## 2012-10-26 ENCOUNTER — Encounter (HOSPITAL_COMMUNITY): Payer: Self-pay | Admitting: Anesthesiology

## 2012-10-26 ENCOUNTER — Ambulatory Visit (HOSPITAL_COMMUNITY): Payer: BC Managed Care – PPO | Admitting: Anesthesiology

## 2012-10-26 DIAGNOSIS — N6019 Diffuse cystic mastopathy of unspecified breast: Secondary | ICD-10-CM

## 2012-10-26 DIAGNOSIS — R229 Localized swelling, mass and lump, unspecified: Secondary | ICD-10-CM | POA: Diagnosis present

## 2012-10-26 DIAGNOSIS — C50919 Malignant neoplasm of unspecified site of unspecified female breast: Secondary | ICD-10-CM

## 2012-10-26 DIAGNOSIS — Z6841 Body Mass Index (BMI) 40.0 and over, adult: Secondary | ICD-10-CM

## 2012-10-26 DIAGNOSIS — Z853 Personal history of malignant neoplasm of breast: Secondary | ICD-10-CM

## 2012-10-26 DIAGNOSIS — R599 Enlarged lymph nodes, unspecified: Secondary | ICD-10-CM | POA: Diagnosis present

## 2012-10-26 DIAGNOSIS — T451X5A Adverse effect of antineoplastic and immunosuppressive drugs, initial encounter: Secondary | ICD-10-CM | POA: Diagnosis present

## 2012-10-26 DIAGNOSIS — N059 Unspecified nephritic syndrome with unspecified morphologic changes: Secondary | ICD-10-CM | POA: Diagnosis present

## 2012-10-26 DIAGNOSIS — C7951 Secondary malignant neoplasm of bone: Secondary | ICD-10-CM | POA: Diagnosis present

## 2012-10-26 HISTORY — PX: MASTECTOMY MODIFIED RADICAL: SHX5962

## 2012-10-26 HISTORY — PX: SIMPLE MASTECTOMY WITH AXILLARY SENTINEL NODE BIOPSY: SHX6098

## 2012-10-26 HISTORY — PX: TOTAL MASTECTOMY: SHX6129

## 2012-10-26 LAB — CBC
HCT: 33 % — ABNORMAL LOW (ref 36.0–46.0)
MCHC: 33 g/dL (ref 30.0–36.0)
Platelets: 297 10*3/uL (ref 150–400)
RDW: 14.9 % (ref 11.5–15.5)
WBC: 10 10*3/uL (ref 4.0–10.5)

## 2012-10-26 LAB — CREATININE, SERUM
Creatinine, Ser: 1.19 mg/dL — ABNORMAL HIGH (ref 0.50–1.10)
GFR calc Af Amer: 60 mL/min — ABNORMAL LOW (ref >90)
GFR calc non Af Amer: 52 mL/min — ABNORMAL LOW (ref >90)

## 2012-10-26 SURGERY — SIMPLE MASTECTOMY
Anesthesia: General | Site: Breast | Laterality: Right | Wound class: Clean

## 2012-10-26 MED ORDER — SODIUM CHLORIDE 0.9 % IV SOLN
INTRAVENOUS | Status: DC | PRN
  Administered 2012-10-26: 09:00:00 via INTRAVENOUS

## 2012-10-26 MED ORDER — METOPROLOL TARTRATE 12.5 MG HALF TABLET
12.5000 mg | ORAL_TABLET | Freq: Two times a day (BID) | ORAL | Status: DC
  Administered 2012-10-26 – 2012-10-28 (×4): 12.5 mg via ORAL
  Filled 2012-10-26 (×7): qty 1

## 2012-10-26 MED ORDER — LACTATED RINGERS IV SOLN
INTRAVENOUS | Status: DC | PRN
  Administered 2012-10-26 (×2): via INTRAVENOUS

## 2012-10-26 MED ORDER — HYDROMORPHONE HCL PF 1 MG/ML IJ SOLN
INTRAMUSCULAR | Status: AC
  Filled 2012-10-26: qty 1

## 2012-10-26 MED ORDER — HEPARIN SODIUM (PORCINE) 5000 UNIT/ML IJ SOLN
5000.0000 [IU] | Freq: Three times a day (TID) | INTRAMUSCULAR | Status: DC
  Administered 2012-10-27 – 2012-10-28 (×4): 5000 [IU] via SUBCUTANEOUS
  Filled 2012-10-26 (×9): qty 1

## 2012-10-26 MED ORDER — 0.9 % SODIUM CHLORIDE (POUR BTL) OPTIME
TOPICAL | Status: DC | PRN
  Administered 2012-10-26 (×2): 1000 mL

## 2012-10-26 MED ORDER — GABAPENTIN 100 MG PO CAPS
100.0000 mg | ORAL_CAPSULE | Freq: Three times a day (TID) | ORAL | Status: DC
Start: 2012-10-26 — End: 2012-10-28
  Administered 2012-10-26 – 2012-10-28 (×6): 100 mg via ORAL
  Filled 2012-10-26 (×10): qty 1

## 2012-10-26 MED ORDER — FENTANYL CITRATE 0.05 MG/ML IJ SOLN
INTRAMUSCULAR | Status: DC | PRN
  Administered 2012-10-26 (×5): 50 ug via INTRAVENOUS
  Administered 2012-10-26 (×2): 100 ug via INTRAVENOUS
  Administered 2012-10-26: 50 ug via INTRAVENOUS

## 2012-10-26 MED ORDER — HYDROMORPHONE HCL PF 1 MG/ML IJ SOLN
0.5000 mg | INTRAMUSCULAR | Status: DC | PRN
  Administered 2012-10-26: 0.5 mg via INTRAVENOUS
  Filled 2012-10-26: qty 1

## 2012-10-26 MED ORDER — PHENYLEPHRINE HCL 10 MG/ML IJ SOLN
INTRAMUSCULAR | Status: DC | PRN
  Administered 2012-10-26: 80 ug via INTRAVENOUS
  Administered 2012-10-26: 120 ug via INTRAVENOUS
  Administered 2012-10-26: 80 ug via INTRAVENOUS

## 2012-10-26 MED ORDER — ROCURONIUM BROMIDE 100 MG/10ML IV SOLN
INTRAVENOUS | Status: DC | PRN
  Administered 2012-10-26: 50 mg via INTRAVENOUS

## 2012-10-26 MED ORDER — ACETAMINOPHEN 650 MG RE SUPP: 650.0000 mg | Freq: Four times a day (QID) | RECTAL | Status: DC | PRN

## 2012-10-26 MED ORDER — PROPOFOL 10 MG/ML IV BOLUS
INTRAVENOUS | Status: DC | PRN
  Administered 2012-10-26: 120 mg via INTRAVENOUS

## 2012-10-26 MED ORDER — ACETAMINOPHEN 325 MG PO TABS: 650.0000 mg | ORAL_TABLET | Freq: Four times a day (QID) | ORAL | Status: DC | PRN

## 2012-10-26 MED ORDER — OXYCODONE HCL 5 MG PO TABS
5.0000 mg | ORAL_TABLET | ORAL | Status: DC | PRN
  Administered 2012-10-26 – 2012-10-27 (×7): 10 mg via ORAL
  Filled 2012-10-26 (×7): qty 2

## 2012-10-26 MED ORDER — OXYCODONE HCL 5 MG/5ML PO SOLN: 5.0000 mg | Freq: Once | ORAL | Status: DC | PRN

## 2012-10-26 MED ORDER — MIDAZOLAM HCL 5 MG/5ML IJ SOLN
INTRAMUSCULAR | Status: DC | PRN
  Administered 2012-10-26: 2 mg via INTRAVENOUS

## 2012-10-26 MED ORDER — DEXAMETHASONE SODIUM PHOSPHATE 4 MG/ML IJ SOLN
INTRAMUSCULAR | Status: DC | PRN
  Administered 2012-10-26: 8 mg via INTRAVENOUS

## 2012-10-26 MED ORDER — OXYCODONE HCL 5 MG PO TABS: 5.0000 mg | ORAL_TABLET | Freq: Once | ORAL | Status: DC | PRN

## 2012-10-26 MED ORDER — SODIUM CHLORIDE 0.9 % IV SOLN
INTRAVENOUS | Status: DC
  Administered 2012-10-26: 16:00:00 via INTRAVENOUS

## 2012-10-26 MED ORDER — DOCUSATE SODIUM 100 MG PO CAPS
100.0000 mg | ORAL_CAPSULE | Freq: Two times a day (BID) | ORAL | Status: DC
  Administered 2012-10-26 – 2012-10-28 (×4): 100 mg via ORAL
  Filled 2012-10-26 (×4): qty 1

## 2012-10-26 MED ORDER — 0.9 % SODIUM CHLORIDE (POUR BTL) OPTIME
TOPICAL | Status: DC | PRN
  Administered 2012-10-26: 1000 mL

## 2012-10-26 MED ORDER — FUROSEMIDE 20 MG PO TABS
20.0000 mg | ORAL_TABLET | Freq: Every day | ORAL | Status: DC
  Administered 2012-10-26 – 2012-10-27 (×2): 20 mg via ORAL
  Filled 2012-10-26 (×3): qty 1

## 2012-10-26 MED ORDER — METHYLENE BLUE 1 % INJ SOLN
INTRAMUSCULAR | Status: AC
  Filled 2012-10-26: qty 10

## 2012-10-26 MED ORDER — VALACYCLOVIR HCL 500 MG PO TABS
500.0000 mg | ORAL_TABLET | Freq: Every day | ORAL | Status: DC
  Administered 2012-10-27 – 2012-10-28 (×2): 500 mg via ORAL
  Filled 2012-10-26 (×2): qty 1

## 2012-10-26 MED ORDER — GLYCOPYRROLATE 0.2 MG/ML IJ SOLN
INTRAMUSCULAR | Status: DC | PRN
  Administered 2012-10-26: .5 mg via INTRAVENOUS

## 2012-10-26 MED ORDER — LIDOCAINE HCL (CARDIAC) 20 MG/ML IV SOLN
INTRAVENOUS | Status: DC | PRN
  Administered 2012-10-26: 100 mg via INTRAVENOUS

## 2012-10-26 MED ORDER — ONDANSETRON HCL 4 MG/2ML IJ SOLN: 4.0000 mg | Freq: Four times a day (QID) | INTRAMUSCULAR | Status: DC | PRN

## 2012-10-26 MED ORDER — ONDANSETRON HCL 4 MG/2ML IJ SOLN
INTRAMUSCULAR | Status: DC | PRN
  Administered 2012-10-26 (×2): 4 mg via INTRAVENOUS

## 2012-10-26 MED ORDER — NEOSTIGMINE METHYLSULFATE 1 MG/ML IJ SOLN
INTRAMUSCULAR | Status: DC | PRN
  Administered 2012-10-26: 2.5 mg via INTRAVENOUS

## 2012-10-26 MED ORDER — SULFAMETHOXAZOLE-TMP DS 800-160 MG PO TABS
1.0000 | ORAL_TABLET | Freq: Two times a day (BID) | ORAL | Status: DC
  Administered 2012-10-26 – 2012-10-28 (×5): 1 via ORAL
  Filled 2012-10-26 (×8): qty 1

## 2012-10-26 MED ORDER — PANTOPRAZOLE SODIUM 40 MG PO TBEC
40.0000 mg | DELAYED_RELEASE_TABLET | Freq: Two times a day (BID) | ORAL | Status: DC
  Administered 2012-10-26 – 2012-10-28 (×4): 40 mg via ORAL
  Filled 2012-10-26 (×4): qty 1

## 2012-10-26 MED ORDER — SODIUM CHLORIDE 0.9 % IJ SOLN
INTRAMUSCULAR | Status: AC
  Filled 2012-10-26: qty 10

## 2012-10-26 MED ORDER — ALPRAZOLAM 0.25 MG PO TABS: 0.2500 mg | ORAL_TABLET | Freq: Three times a day (TID) | ORAL | Status: DC | PRN

## 2012-10-26 MED ORDER — ONDANSETRON HCL 4 MG/2ML IJ SOLN: 4.0000 mg | Freq: Once | INTRAMUSCULAR | Status: DC | PRN

## 2012-10-26 MED ORDER — MEPERIDINE HCL 25 MG/ML IJ SOLN: 6.2500 mg | INTRAMUSCULAR | Status: DC | PRN

## 2012-10-26 MED ORDER — HYDROMORPHONE HCL PF 1 MG/ML IJ SOLN
0.2500 mg | INTRAMUSCULAR | Status: DC | PRN
  Administered 2012-10-26 (×4): 0.5 mg via INTRAVENOUS

## 2012-10-26 SURGICAL SUPPLY — 68 items
ADH SKN CLS APL DERMABOND .7 (GAUZE/BANDAGES/DRESSINGS) ×21
APPLIER CLIP 9.375 MED OPEN (MISCELLANEOUS) ×4
APR CLP MED 9.3 20 MLT OPN (MISCELLANEOUS) ×3
BINDER BREAST LRG (GAUZE/BANDAGES/DRESSINGS) IMPLANT
BINDER BREAST XLRG (GAUZE/BANDAGES/DRESSINGS) ×2 IMPLANT
BLADE KNIFE  10 PERSONNA (BLADE) ×4 IMPLANT
BLADE SURG 15 STRL LF DISP TIS (BLADE) ×1 IMPLANT
BLADE SURG 15 STRL SS (BLADE) ×4
CANISTER SUCTION 2500CC (MISCELLANEOUS) ×4 IMPLANT
CHLORAPREP W/TINT 26ML (MISCELLANEOUS) ×6 IMPLANT
CLIP APPLIE 9.375 MED OPEN (MISCELLANEOUS) ×3 IMPLANT
CLOTH BEACON ORANGE TIMEOUT ST (SAFETY) ×4 IMPLANT
CLSR STERI-STRIP ANTIMIC 1/2X4 (GAUZE/BANDAGES/DRESSINGS) ×8 IMPLANT
CONT SPEC 4OZ CLIKSEAL STRL BL (MISCELLANEOUS) ×6 IMPLANT
COVER PROBE W GEL 5X96 (DRAPES) ×2 IMPLANT
COVER SURGICAL LIGHT HANDLE (MISCELLANEOUS) ×4 IMPLANT
DERMABOND ADVANCED (GAUZE/BANDAGES/DRESSINGS) ×7
DERMABOND ADVANCED .7 DNX12 (GAUZE/BANDAGES/DRESSINGS) ×7 IMPLANT
DRAIN CHANNEL 19F RND (DRAIN) ×10 IMPLANT
DRAPE CHEST BREAST 15X10 FENES (DRAPES) ×4 IMPLANT
DRAPE LAPAROSCOPIC ABDOMINAL (DRAPES) ×4 IMPLANT
DRAPE UTILITY 15X26 W/TAPE STR (DRAPE) ×12 IMPLANT
DRSG PAD ABDOMINAL 8X10 ST (GAUZE/BANDAGES/DRESSINGS) ×4 IMPLANT
ELECT BLADE 4.0 EZ CLEAN MEGAD (MISCELLANEOUS) ×8
ELECT CAUTERY BLADE 6.4 (BLADE) ×6 IMPLANT
ELECT REM PT RETURN 9FT ADLT (ELECTROSURGICAL) ×4
ELECTRODE BLDE 4.0 EZ CLN MEGD (MISCELLANEOUS) ×4 IMPLANT
ELECTRODE REM PT RTRN 9FT ADLT (ELECTROSURGICAL) ×3 IMPLANT
EVACUATOR SILICONE 100CC (DRAIN) ×10 IMPLANT
GLOVE BIO SURGEON STRL SZ7 (GLOVE) ×6 IMPLANT
GLOVE BIO SURGEON STRL SZ7.5 (GLOVE) ×8 IMPLANT
GLOVE BIOGEL PI IND STRL 6.5 (GLOVE) ×2 IMPLANT
GLOVE BIOGEL PI IND STRL 7.0 (GLOVE) ×2 IMPLANT
GLOVE BIOGEL PI IND STRL 7.5 (GLOVE) ×7 IMPLANT
GLOVE BIOGEL PI INDICATOR 6.5 (GLOVE) ×2
GLOVE BIOGEL PI INDICATOR 7.0 (GLOVE) ×2
GLOVE BIOGEL PI INDICATOR 7.5 (GLOVE) ×5
GLOVE SURG SS PI 7.0 STRL IVOR (GLOVE) ×4 IMPLANT
GOWN STRL NON-REIN LRG LVL3 (GOWN DISPOSABLE) ×12 IMPLANT
KIT BASIN OR (CUSTOM PROCEDURE TRAY) ×4 IMPLANT
KIT ROOM TURNOVER OR (KITS) ×4 IMPLANT
MARKER SKIN DUAL TIP RULER LAB (MISCELLANEOUS) ×2 IMPLANT
NDL 18GX1X1/2 (RX/OR ONLY) (NEEDLE) IMPLANT
NDL HYPO 25GX1X1/2 BEV (NEEDLE) IMPLANT
NEEDLE 18GX1X1/2 (RX/OR ONLY) (NEEDLE) IMPLANT
NEEDLE HYPO 25GX1X1/2 BEV (NEEDLE) IMPLANT
NS IRRIG 1000ML POUR BTL (IV SOLUTION) ×4 IMPLANT
PACK GENERAL/GYN (CUSTOM PROCEDURE TRAY) ×4 IMPLANT
PAD ARMBOARD 7.5X6 YLW CONV (MISCELLANEOUS) ×4 IMPLANT
PENCIL BUTTON HOLSTER BLD 10FT (ELECTRODE) ×4 IMPLANT
SPECIMEN JAR X LARGE (MISCELLANEOUS) ×6 IMPLANT
SPONGE GAUZE 4X4 12PLY (GAUZE/BANDAGES/DRESSINGS) ×2 IMPLANT
SPONGE LAP 18X18 X RAY DECT (DISPOSABLE) ×4 IMPLANT
STAPLER VISISTAT 35W (STAPLE) ×4 IMPLANT
STRIP CLOSURE SKIN 1/2X4 (GAUZE/BANDAGES/DRESSINGS) ×8 IMPLANT
SUT ETHILON 2 0 FS 18 (SUTURE) ×10 IMPLANT
SUT MNCRL AB 3-0 PS2 18 (SUTURE) ×2 IMPLANT
SUT MNCRL AB 4-0 PS2 18 (SUTURE) ×10 IMPLANT
SUT MON AB 4-0 PC3 18 (SUTURE) ×4 IMPLANT
SUT MON AB 5-0 PS2 18 (SUTURE) ×4 IMPLANT
SUT SILK 2 0 FS (SUTURE) ×6 IMPLANT
SUT SILK 2 0 SH (SUTURE) ×2 IMPLANT
SUT VIC AB 3-0 SH 18 (SUTURE) ×4 IMPLANT
SUT VIC AB 3-0 SH 8-18 (SUTURE) ×14 IMPLANT
SYR CONTROL 10ML LL (SYRINGE) ×2 IMPLANT
TAPE CLOTH SURG 4X10 WHT LF (GAUZE/BANDAGES/DRESSINGS) ×2 IMPLANT
TOWEL OR 17X24 6PK STRL BLUE (TOWEL DISPOSABLE) ×4 IMPLANT
TOWEL OR 17X26 10 PK STRL BLUE (TOWEL DISPOSABLE) ×4 IMPLANT

## 2012-10-26 NOTE — Anesthesia Postprocedure Evaluation (Signed)
Anesthesia Post Note  Patient: Cheryl Moore  Procedure(s) Performed: Procedure(s) (LRB): TOTAL MASTECTOMY (Left) MASTECTOMY MODIFIED RADICAL (Right)  Anesthesia type: general  Patient location: PACU  Post pain: Pain level controlled  Post assessment: Patient's Cardiovascular Status Stable  Last Vitals:  Filed Vitals:   10/26/12 1413  BP: 134/61  Pulse: 102  Temp: 36.9 C  Resp: 18    Post vital signs: Reviewed and stable  Level of consciousness: sedated  Complications: No apparent anesthesia complications

## 2012-10-26 NOTE — Anesthesia Preprocedure Evaluation (Addendum)
Anesthesia Evaluation  Patient identified by MRN, date of birth, ID band Patient awake    Reviewed: Allergy & Precautions, H&P , NPO status , Patient's Chart, lab work & pertinent test results, reviewed documented beta blocker date and time   Airway Mallampati: II TM Distance: >3 FB Neck ROM: Full    Dental  (+) Teeth Intact   Pulmonary shortness of breath and with exertion,          Cardiovascular     Neuro/Psych    GI/Hepatic   Endo/Other  Morbid obesity  Renal/GU      Musculoskeletal   Abdominal   Peds  Hematology   Anesthesia Other Findings   Reproductive/Obstetrics                         Anesthesia Physical Anesthesia Plan  ASA: II  Anesthesia Plan: General   Post-op Pain Management:    Induction: Intravenous  Airway Management Planned: Oral ETT  Additional Equipment:   Intra-op Plan:   Post-operative Plan: Extubation in OR  Informed Consent: I have reviewed the patients History and Physical, chart, labs and discussed the procedure including the risks, benefits and alternatives for the proposed anesthesia with the patient or authorized representative who has indicated his/her understanding and acceptance.   Dental advisory given  Plan Discussed with: CRNA and Surgeon  Anesthesia Plan Comments:        Anesthesia Quick Evaluation

## 2012-10-26 NOTE — Op Note (Signed)
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 Harden Mo Assistant: Ms. Magnus Ivan Estimated blood loss: 50 cc Specimens: #1 left breast marked with short stitch superior, long stitch lateral #2 right breast marked short stitch superior, long stitch lateral with axillary contents lateral #3 right axillary skin mass Complications: None Drains: 2 19 French Blake drains to either side Complications: None Sponge and needle count correct at completion Disposition to recovery in stable condition  Indications: This is a 52 year old female who presented with stage IV breast cancer. This was metastatic to bone only. She also underwent a skin biopsy of the right breast which showed invasion of the dermal lymphatics consistent with inflammatory breast cancer. She has completed most of her chemotherapy. She has had some difficulty breathing as well as some cardiac issues. 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 We have discussed her in a multidisciplinary fashion and she would also like to proceed with surgery for local control. I think this is reasonable given the bone only metastases that she has. I discussed with her a mastectomy on her right side with removal of some enlarged lymph nodes. She very adamantly wants a bilateral mastectomy although I have told her that we'll not change her survival. She would like to do this mostly for symmetry. After a long discussion we decided proceed with bilateral mastectomies and removal of some right axillary lymph nodes that are enlarged.  Procedure: After informed consent was obtained the patient was taken to the operating room. She was given vancomycin. Sequential compression devices were on her legs.  She was then placed under general anesthesia without complication. Foley catheter was placed. She was then prepped and draped in the standard sterile surgical fashion. A surgical timeout was then performed.  I did a left mastectomy first. I made a large elliptical incision due to the fact she is not getting reconstruction. I removed a fair amount of skin on this side. I then carried my flaps to the clavicle superiorly, inframammary crease inferiorly, sternum medially and the latissimus laterally. This was then removed from the pectoralis muscle to include the fascia. This was then passed off the table as a specimen and marked as above. Hemostasis was obtained. I then placed 2 19 French Blake drains in the space and secured these with 2-0 nylon suture. I then began closing the dermis with 3-0 Vicryl. Laterally there was a lot of excess tissue so I removed a fair amount of extra skin and close this in a Y fashion. After the dermis was closed with 3-0 Vicryl the skin was then closed with 4 Monocryl. Dermabond and Steri-Strips were eventually placed on this side.  I then performed a right mastectomy. This was more difficult due to her tumor as well as very thick skin. I made an elliptical incision encompassing the nipple areolar complex. I then created flaps in a similar fashion. This was much more difficult due to the hardness of her skin. I then rolled this laterally off the pectoralis muscle including the fascia. I did not perform a full axillary dissection as she has metastatic disease. She had a few enlarged lymph nodes that I removed along with the breast specimen. I then marked this as above.  Laterally there was a lot of excess tissue so I removed a  fair amount of extra skin and close this in a Y fashion.I placed 2 19 [place] Blake drains and secured these with 2-0 nylon as well. I then closed this with 3-0 Vicryl, 4-0 Monocryl, and Dermabond and Steri-Strips. She also a small axillary mass which I think is  most likely from her skin but I did excise this and then closes with 3-0 Vicryl 4 Monocryl. She tolerated all this well. Dressings were placed. A breast binder was placed. She was extubated and transferred to recovery in stable condition.

## 2012-10-26 NOTE — Transfer of Care (Signed)
Immediate Anesthesia Transfer of Care Note  Patient: Cheryl Moore  Procedure(s) Performed: Procedure(s): TOTAL MASTECTOMY (Left) MASTECTOMY MODIFIED RADICAL (Right)  Patient Location: PACU  Anesthesia Type:General  Level of Consciousness: awake, alert  and oriented  Airway & Oxygen Therapy: Patient Spontanous Breathing and Patient connected to nasal cannula oxygen  Post-op Assessment: Report given to PACU RN, Post -op Vital signs reviewed and stable and Patient moving all extremities X 4  Post vital signs: Reviewed and stable  Complications: No apparent anesthesia complications

## 2012-10-26 NOTE — Preoperative (Signed)
Beta Blockers   Reason not to administer Beta Blockers:Not Applicable 

## 2012-10-26 NOTE — Interval H&P Note (Signed)
History and Physical Interval Note:  10/26/2012 8:20 AM  Cheryl Moore  has presented today for surgery, with the diagnosis of rt breast cancer  The various methods of treatment have been discussed with the patient and family. After consideration of risks, benefits and other options for treatment, the patient has consented to  Procedure(s): TOTAL MASTECTOMY WITH RIGHT AXILLARY NODE DISSECTION (Right) SIMPLE MASTECTOMY (Left) as a surgical intervention .  The patient's history has been reviewed, patient examined, no change in status, stable for surgery.  I have reviewed the patient's chart and labs.  Questions were answered to the patient's satisfaction.     Briyan Kleven

## 2012-10-26 NOTE — Progress Notes (Signed)
At 1545, patient medicated with Dilaudid 0.5 mg for pain. Discussed pain management, cancer treatment, and post-op needs. Slight nausea. Up to bathroom with 1 assist, voiding OK, tolerating clear liquids. Steri-strips to both surgical sites intact with ABD, no drainage. Will monitor mobility and comfort. 4 JP drains measured by tech. Sherlyn Lees, RN

## 2012-10-26 NOTE — H&P (View-Only) (Signed)
Patient ID: Cheryl Moore, female   DOB: [birth year], 52 y.o.   MRN: [id number]  Chief Complaint  Patient presents with  . Follow-up    Discuss breast surgery    HPI [name] is a 52 y.o. female.   HPI 84 yof who presented in 4/14 with what ended up being a right breast inflammatory cancer proven by punch biopsy and a pet scan that shows bony metastatic disease.  Her MR had an area of ill defined mass measuring 6.1x3.9x3.6 cm  Biopsy was invasive ductal carcinoma that is er/pr positive, her 2 not amplified.  Her node was positive then also.  She has undergone primary systemic therapy via left sided port I placed.  She finished 10 days ago.  She has had trouble with shortness of breath for which she has seen Dr Sherene Sires.  She also had some neuropathy but also a significant rash that led to cessation of taxol a little early.  She is just starting to feel better after finishing chemotherapy. She desires bilateral mastectomies and comes in today to discuss.Recent repeat MR shows left breast to be normal, right axillary nodes are much smaller with largest being 1.2x1.1 cm in size.  The skin thickening in right breast is better and the area of enhancement in right breast are smaller.    Past Medical History  Diagnosis Date  . Breast cancer   . Anxiety   . Hot flashes     Past Surgical History  Procedure Laterality Date  . Cesarean section  2005  . Breast surgery Right     breast bx  . Breast biopsy Right 05/23/2012    Procedure: SKIN PUNCH BIOPSY RIGHT BREAST;  Surgeon: Emelia Loron, MD;  Location: [facility] OR;  Service: General;  Laterality: Right;  . Portacath placement Left 05/23/2012    Procedure: INSERTION PORT-A-CATH;  Surgeon: Emelia Loron, MD;  Location: [facility] OR;  Service: General;  Laterality: Left;    Family History  Problem Relation Age of Onset  . Adopted: Yes    Social History History  Substance Use Topics  . Smoking status: Former Smoker -- 0.25 packs/day for 5 years      Types: Cigarettes    Quit date: 05/21/2002  . Smokeless tobacco: Never Used  . Alcohol Use: Yes     Comment: 1-2 per month    Allergies  Allergen Reactions  . Penicillins Swelling    "Throat swells shut"    Current Outpatient Prescriptions  Medication Sig Dispense Refill  . ALPRAZolam (XANAX) 0.25 MG tablet Take 1 tablet (0.25 mg total) by mouth 3 (three) times daily as needed for anxiety.  30 tablet  3  . Alum & Mag Hydroxide-Simeth (MAGIC MOUTHWASH W/LIDOCAINE) SOLN Take 5 mLs by mouth 4 (four) times daily as needed.      . Ascorbic Acid (VITAMIN C PO) Take 1 tablet by mouth daily.      Marland Kitchen CALCIUM PO Take 1 tablet by mouth daily.      Marland Kitchen dexamethasone (DECADRON) 4 MG tablet TAKE 2 TABS TWICE A DAY FOR 3 DAYS AFTER CHEMO.  30 tablet  0  . fluconazole (DIFLUCAN) 100 MG tablet Take 2 tablets (200 mg total) by mouth daily.  5 tablet  2  . furosemide (LASIX) 20 MG tablet Take 1 tablet (20 mg total) by mouth daily.  30 tablet  0  . gabapentin (NEURONTIN) 100 MG capsule TAKE 1 CAPSULE (100 MG TOTAL) BY MOUTH 3 (THREE) TIMES DAILY.  90  capsule  0  . ibuprofen (ADVIL,MOTRIN) 200 MG tablet Take 200-400 mg by mouth every 8 (eight) hours as needed for pain.       Marland Kitchen lidocaine-prilocaine (EMLA) cream Apply topically as needed.  30 g  6  . LORazepam (ATIVAN) 0.5 MG tablet Take 1 tablet (0.5 mg total) by mouth every 8 (eight) hours. For nausea / vomiting  60 tablet  3  . metoprolol tartrate (LOPRESSOR) 25 MG tablet Take 0.5 tablets (12.5 mg total) by mouth 2 (two) times daily.  60 tablet  0  . Multiple Vitamin (MULTIVITAMIN) tablet Take 1 tablet by mouth daily.      Marland Kitchen nystatin (MYCOSTATIN) 100000 UNIT/ML suspension Take 5 mLs (500,000 Units total) by mouth 4 (four) times daily.  180 mL  6  . ondansetron (ZOFRAN) 8 MG tablet Take 8 mg by mouth every 8 (eight) hours as needed for nausea.      Marland Kitchen oxyCODONE-acetaminophen (ROXICET) 5-325 MG per tablet Take 1 tablet by mouth every 4 (four) hours as needed  for pain.  20 tablet  0  . pantoprazole (PROTONIX) 40 MG tablet Take 1 tablet (40 mg total) by mouth daily.  30 tablet  3  . potassium chloride SA (K-DUR,KLOR-CON) 20 MEQ tablet Take 1 tablet (20 mEq total) by mouth daily.  30 tablet  0  . predniSONE (STERAPRED UNI-PAK) 10 MG tablet Take 1 tablet (10 mg total) by mouth as directed. 60mg  pox 1day,50mg  po x1 day,40mg  po x1 day,30mg  po x1 day,20mg  po x1 day,10mg  po x1 day.  21 tablet  0  . prochlorperazine (COMPAZINE) 10 MG tablet Take 1 tablet (10 mg total) by mouth every 6 (six) hours as needed.  30 tablet  2  . valACYclovir (VALTREX) 500 MG tablet Take 1 tablet (500 mg total) by mouth daily.  30 tablet  5   No current facility-administered medications for this visit.    Review of Systems Review of Systems  Constitutional: Negative for fever, chills and unexpected weight change.  HENT: Negative for hearing loss, congestion, sore throat, trouble swallowing and voice change.   Eyes: Negative for visual disturbance.  Respiratory: Positive for shortness of breath. Negative for cough and wheezing.   Cardiovascular: Negative for chest pain, palpitations and leg swelling.  Gastrointestinal: Negative for nausea, vomiting, abdominal pain, diarrhea, constipation, blood in stool, abdominal distention and anal bleeding.  Genitourinary: Negative for hematuria, vaginal bleeding and difficulty urinating.  Musculoskeletal: Negative for arthralgias.  Skin: Negative for rash and wound.  Neurological: Negative for seizures, syncope and headaches.  Hematological: Negative for adenopathy. Does not bruise/bleed easily.  Psychiatric/Behavioral: Negative for confusion.    Blood pressure 128/82, pulse 84, resp. rate 18, height 5\' 2"  (1.575 m), weight 242 lb (109.77 kg).  Physical Exam Physical Exam  Vitals reviewed. Constitutional: She appears well-developed and well-nourished.  Neck: Neck supple.  Cardiovascular: Normal rate, regular rhythm and normal heart  sounds.   Pulmonary/Chest: Effort normal and breath sounds normal. She has no wheezes. She has no rales. Right breast exhibits mass and skin change. Right breast exhibits no inverted nipple, no nipple discharge and no tenderness. Left breast exhibits no inverted nipple, no mass, no nipple discharge, no skin change and no tenderness.    Abdominal: Soft.  Lymphadenopathy:    She has no cervical adenopathy.    She has no axillary adenopathy.       Right: No supraclavicular adenopathy present.       Left: No supraclavicular adenopathy present.  Assessment    Inflammatory right breast cancer,stage IV    Plan    Right MRM Left total mastectomy  She has stage IV disease with initial inflammatory cancer and I don't know if surgery will improve her survival. She appears to have a good response to treatment so far and has bone only mets with hormone receptor positive tumor meaning this disease could be managed for long term.  I think reasonable to consider surgery on the right with mastectomy and alnd removing remaining large nodes.  She very much desires having the left side removed for symmetry and her own piece of mind.  I told her several times that this will not affect her survival and could indeed delay next step of treatment if there is complication on this side.  She is adamant that we do bilateral surgery. I have discussed reconstruction and referral to plastic surgery but she does not want to pursue this.  After long discussion we will proceed with bilateral mastectomies.  I am also concerned about longer time under anesthetic for her. We discussed risks of surgery being, but not limited to, bleeding, reoperation, infection wound dehiscence, flap death, dvt/pe, pna/uti/cardiac complications.  I also told her neither side would be completely flat given her habitus.  We discussed long term risk of lymphedema and chronic shoulder issues on the right.  I think due to her current state waiting  another 2-3 weeks will help reduce some of these complications also.        Sharon Stapel 10/04/2012, 10:31 PM

## 2012-10-26 NOTE — Anesthesia Procedure Notes (Signed)
Procedure Name: Intubation Date/Time: 10/26/2012 8:40 AM Performed by: Sharlene Dory E Pre-anesthesia Checklist: Patient identified, Emergency Drugs available, Suction available, Patient being monitored and Timeout performed Patient Re-evaluated:Patient Re-evaluated prior to inductionOxygen Delivery Method: Circle system utilized Preoxygenation: Pre-oxygenation with 100% oxygen Intubation Type: IV induction Ventilation: Mask ventilation without difficulty Laryngoscope Size: Mac and 3 Grade View: Grade I Tube type: Oral Tube size: 7.0 mm Number of attempts: 1 Airway Equipment and Method: Stylet Placement Confirmation: ETT inserted through vocal cords under direct vision,  positive ETCO2 and breath sounds checked- equal and bilateral Secured at: 22 cm Tube secured with: Tape Dental Injury: Teeth and Oropharynx as per pre-operative assessment

## 2012-10-27 LAB — BASIC METABOLIC PANEL
BUN: 16 mg/dL (ref 6–23)
Calcium: 8.1 mg/dL — ABNORMAL LOW (ref 8.4–10.5)
Chloride: 102 mEq/L (ref 96–112)
Creatinine, Ser: 1.59 mg/dL — ABNORMAL HIGH (ref 0.50–1.10)
GFR calc Af Amer: 42 mL/min — ABNORMAL LOW (ref >90)

## 2012-10-27 LAB — CBC
HCT: 29 % — ABNORMAL LOW (ref 36.0–46.0)
Hemoglobin: 9.5 g/dL — ABNORMAL LOW (ref 12.0–15.0)
MCHC: 32.8 g/dL (ref 30.0–36.0)
Platelets: 290 10*3/uL (ref 150–400)
RDW: 15 % (ref 11.5–15.5)
WBC: 9.2 10*3/uL (ref 4.0–10.5)

## 2012-10-27 MED ORDER — SODIUM CHLORIDE 0.9 % IJ SOLN
10.0000 mL | INTRAMUSCULAR | Status: DC | PRN
  Administered 2012-10-28: 10 mL

## 2012-10-27 MED ORDER — HYDROMORPHONE HCL PF 1 MG/ML IJ SOLN: 1.0000 mg | INTRAMUSCULAR | Status: DC | PRN

## 2012-10-27 MED ORDER — HYDROCODONE-ACETAMINOPHEN 10-325 MG PO TABS
1.0000 | ORAL_TABLET | Freq: Four times a day (QID) | ORAL | Status: DC | PRN
  Administered 2012-10-27 – 2012-10-28 (×2): 2 via ORAL
  Filled 2012-10-27 (×2): qty 2

## 2012-10-27 NOTE — Progress Notes (Signed)
1 Day Post-Op  Subjective: Feels well no real complaints, voiding tol diet  Objective: Vital signs in last 24 hours: Temp:  [97 F (36.1 C)-98.7 F (37.1 C)] 98.3 F (36.8 C) (10/02 0225) Pulse Rate:  [96-110] 109 (10/02 0225) Resp:  [14-23] 18 (10/02 0225) BP: (121-157)/(49-80) 121/49 mmHg (10/02 0225) SpO2:  [92 %-98 %] 93 % (10/02 0225) Weight:  [245 lb (111.131 kg)] 245 lb (111.131 kg) (10/01 1927) Last BM Date: 10/26/12  Intake/Output from previous day: 10/01 0701 - 10/02 0700 In: 2350 [I.V.:2350] Out: 285 [Urine:30; Drains:205; Blood:50] Intake/Output this shift: Total I/O In: -  Out: 65 [Drains:65]  General appearance: no distress Resp: diminished breath sounds bibasilar Chest wall: no tenderness, incision clean without infection, flaps with some ecchymosis but viable, drains with expected serosang outpute Cardio: regular rate and rhythm  Lab Results:   Recent Labs  10/26/12 1515 10/27/12 0525  WBC 10.0 9.2  HGB 10.9* 9.5*  HCT 33.0* 29.0*  PLT 297 290   BMET  Recent Labs  10/24/12 1402 10/26/12 1515  NA 140  --   K 3.8  --   CO2 20*  --   GLUCOSE 206*  --   BUN 9.7  --   CREATININE 0.9 1.19*  CALCIUM 9.5  --     Assessment/Plan: Pod 1 bilateral mastectomies 1. Continue po pain meds with iv backup 2. pulm toilet, oob today 3. Continue home meds 4. Saline lock iv 5. If does well today home in am  [facility] 10/27/2012

## 2012-10-28 ENCOUNTER — Encounter (HOSPITAL_COMMUNITY): Payer: Self-pay | Admitting: General Surgery

## 2012-10-28 MED ORDER — SULFAMETHOXAZOLE-TMP DS 800-160 MG PO TABS: 1.0000 | ORAL_TABLET | Freq: Two times a day (BID) | ORAL | Status: DC

## 2012-10-28 MED ORDER — HEPARIN SOD (PORK) LOCK FLUSH 100 UNIT/ML IV SOLN
500.0000 [IU] | INTRAVENOUS | Status: AC | PRN
  Administered 2012-10-28: 500 [IU]

## 2012-10-28 MED ORDER — DIPHENHYDRAMINE HCL 25 MG PO CAPS
25.0000 mg | ORAL_CAPSULE | Freq: Three times a day (TID) | ORAL | Status: DC | PRN
  Administered 2012-10-28: 25 mg via ORAL
  Filled 2012-10-28: qty 1

## 2012-10-28 MED ORDER — HYDROCODONE-ACETAMINOPHEN 10-325 MG PO TABS: 1.0000 | ORAL_TABLET | Freq: Four times a day (QID) | ORAL | Status: DC | PRN

## 2012-10-28 NOTE — Progress Notes (Signed)
Vital signs WNL. Pain well controlled. Patient and family demonstrate ability to manage JP drains. Discharged home accompanied by family.Irena Reichmann

## 2012-10-28 NOTE — Progress Notes (Signed)
2 Days Post-Op  Subjective: Feels great, voiding, some itching with pain meds controlled with benadryl  Objective: Vital signs in last 24 hours: Temp:  [97.7 F (36.5 C)-98.8 F (37.1 C)] 98.4 F (36.9 C) (10/03 0548) Pulse Rate:  [86-101] 86 (10/03 0548) Resp:  [17-18] 18 (10/03 0548) BP: (132-157)/(56-73) 154/65 mmHg (10/03 0548) SpO2:  [91 %-98 %] 98 % (10/03 0548) Last BM Date: 10/26/12  Intake/Output from previous day: 10/02 0701 - 10/03 0700 In: 600 [P.O.:600] Out: 453 [Drains:453] Intake/Output this shift:    General appearance: no distress Resp: clear to auscultation bilaterally Cardio: regular rate and rhythm Incision/Wound:wounds clean without infection, flaps viable, drains serosang  Lab Results:   Recent Labs  10/26/12 1515 10/27/12 0525  WBC 10.0 9.2  HGB 10.9* 9.5*  HCT 33.0* 29.0*  PLT 297 290   BMET  Recent Labs  10/26/12 1515 10/27/12 0525  NA  --  136  K  --  4.2  CL  --  102  CO2  --  19  GLUCOSE  --  151*  BUN  --  16  CREATININE 1.19* 1.59*  CALCIUM  --  8.1*    Anti-infectives: Anti-infectives   Start     Dose/Rate Route Frequency Ordered Stop   10/27/12 1000  valACYclovir (VALTREX) tablet 500 mg     500 mg Oral Daily 10/26/12 1412     10/26/12 1500  sulfamethoxazole-trimethoprim (BACTRIM DS) 800-160 MG per tablet 1 tablet     1 tablet Oral Every 12 hours 10/26/12 1412     10/26/12 0600  vancomycin (VANCOCIN) IVPB 1000 mg/200 mL premix  Status:  Discontinued     1,000 mg 200 mL/hr over 60 Minutes Intravenous On call to O.R. 10/25/12 1421 10/25/12 1421   10/26/12 0600  vancomycin (VANCOCIN) 1,500 mg in sodium chloride 0.9 % 500 mL IVPB     1,500 mg 250 mL/hr over 120 Minutes Intravenous 60 min pre-op 10/25/12 1422 10/26/12 0835      Assessment/Plan: S/p bilateral mastectomies  1. Will dc home today on norco 2.cr elevation likely due to fluid restriction and lasix, will stop lasix, she is voiding well and will have cr  rechecked next week 3. Cont drains, I will see next week 4. Discussed path  [facility] 10/28/2012

## 2012-11-01 ENCOUNTER — Telehealth (INDEPENDENT_AMBULATORY_CARE_PROVIDER_SITE_OTHER): Payer: Self-pay

## 2012-11-01 NOTE — Telephone Encounter (Signed)
LMOM for pt to call me so I can give her the appt time of Dr Dwain Sarna and also another message.

## 2012-11-01 NOTE — Telephone Encounter (Signed)
Pt advised of appt for 10/9 arrive at 4:30. I advised pt that Dr Dwain Sarna is covering trauma call this day so I would like for her to call before she comes to the appt. I can call to make sure Dr Dwain Sarna is not caught in a trauma before the pt heads over to the office. The pt understands.

## 2012-11-03 ENCOUNTER — Encounter (INDEPENDENT_AMBULATORY_CARE_PROVIDER_SITE_OTHER): Payer: Self-pay | Admitting: General Surgery

## 2012-11-03 ENCOUNTER — Ambulatory Visit (INDEPENDENT_AMBULATORY_CARE_PROVIDER_SITE_OTHER): Payer: BC Managed Care – PPO | Admitting: General Surgery

## 2012-11-03 VITALS — BP 122/72 | HR 82 | Resp 18 | Ht 62.0 in | Wt 242.0 lb

## 2012-11-03 DIAGNOSIS — R600 Localized edema: Secondary | ICD-10-CM

## 2012-11-03 DIAGNOSIS — R609 Edema, unspecified: Secondary | ICD-10-CM

## 2012-11-03 DIAGNOSIS — Z09 Encounter for follow-up examination after completed treatment for conditions other than malignant neoplasm: Secondary | ICD-10-CM

## 2012-11-03 NOTE — Progress Notes (Signed)
Subjective:     Patient ID: Cheryl Moore, female   DOB: [birth year], 52 y.o.   MRN: [id number]  HPI 64 yof with stage IV breast cancer who we decided in multidisciplinary fashion to proceed with right mastectomy and she was adamant we pursue left mastectomy.  She underwent bilateral mastectomy and I removed some enlarged nodes at time of surgery.  She had increased cr after surgery I think likely due to lasix she was put on before surgery.  She is voiding well at home but has not rechecked creatinine.  She is doing well and only one of four drains can be removed today.  Path is left negative, right side with 5/5 nodes positive, residual IDC largest focus 3.8 cm with multiple foci involving skin of nipple and areola but not dermal lymphatics now.  I also removed mass in right axilla that was small cyst/abscess.  Review of Systems     Objective:   Physical Exam Left sided incision with viable flaps, no infection, I removed one drain Right side has more residual skin on purpose, two drains with serosang fluid, no infection, viable flaps    Assessment:     Stage IV breast cancer Elevated Cr S/p bilateral mastectomies     Plan:     I removed one drain today.  She will call next week when others put out less than 30cc in 2 consecutive days.  Her le edema is somewhat worsened off lasix but was stopped due to cr.  I will send her to get bmet today.  She needs oncology follow up in near future also.

## 2012-11-04 ENCOUNTER — Telehealth (INDEPENDENT_AMBULATORY_CARE_PROVIDER_SITE_OTHER): Payer: Self-pay

## 2012-11-04 LAB — BASIC METABOLIC PANEL
BUN: 12 mg/dL (ref 6–23)
Creat: 1.33 mg/dL — ABNORMAL HIGH (ref 0.50–1.10)
Potassium: 4.6 mEq/L (ref 3.5–5.3)

## 2012-11-04 NOTE — Telephone Encounter (Signed)
Called pt to notify her that her creatnine is better per Dr Dwain Sarna. The pt will call us to make her next appt once her drains have put out less than 30cc in 48hrs. The pt understands.

## 2012-11-08 ENCOUNTER — Telehealth: Payer: Self-pay | Admitting: Oncology

## 2012-11-08 ENCOUNTER — Encounter (INDEPENDENT_AMBULATORY_CARE_PROVIDER_SITE_OTHER): Payer: Self-pay

## 2012-11-08 ENCOUNTER — Ambulatory Visit (INDEPENDENT_AMBULATORY_CARE_PROVIDER_SITE_OTHER): Payer: BC Managed Care – PPO | Admitting: General Surgery

## 2012-11-08 ENCOUNTER — Other Ambulatory Visit (HOSPITAL_BASED_OUTPATIENT_CLINIC_OR_DEPARTMENT_OTHER): Payer: BC Managed Care – PPO | Admitting: Lab

## 2012-11-08 ENCOUNTER — Ambulatory Visit (HOSPITAL_BASED_OUTPATIENT_CLINIC_OR_DEPARTMENT_OTHER): Payer: BC Managed Care – PPO | Admitting: Oncology

## 2012-11-08 VITALS — BP 124/76 | HR 82 | Temp 97.4°F | Resp 22 | Ht 62.0 in | Wt 243.0 lb

## 2012-11-08 VITALS — BP 121/80 | HR 88 | Temp 97.6°F | Resp 20 | Ht 62.0 in | Wt 240.6 lb

## 2012-11-08 DIAGNOSIS — Z4803 Encounter for change or removal of drains: Secondary | ICD-10-CM

## 2012-11-08 DIAGNOSIS — G609 Hereditary and idiopathic neuropathy, unspecified: Secondary | ICD-10-CM

## 2012-11-08 DIAGNOSIS — C50919 Malignant neoplasm of unspecified site of unspecified female breast: Secondary | ICD-10-CM

## 2012-11-08 DIAGNOSIS — C773 Secondary and unspecified malignant neoplasm of axilla and upper limb lymph nodes: Secondary | ICD-10-CM

## 2012-11-08 DIAGNOSIS — G4700 Insomnia, unspecified: Secondary | ICD-10-CM

## 2012-11-08 DIAGNOSIS — C7951 Secondary malignant neoplasm of bone: Secondary | ICD-10-CM

## 2012-11-08 DIAGNOSIS — C50911 Malignant neoplasm of unspecified site of right female breast: Secondary | ICD-10-CM

## 2012-11-08 DIAGNOSIS — C50419 Malignant neoplasm of upper-outer quadrant of unspecified female breast: Secondary | ICD-10-CM

## 2012-11-08 DIAGNOSIS — C50411 Malignant neoplasm of upper-outer quadrant of right female breast: Secondary | ICD-10-CM

## 2012-11-08 LAB — CBC WITH DIFFERENTIAL/PLATELET
BASO%: 0.4 % (ref 0.0–2.0)
Eosinophils Absolute: 0.2 10*3/uL (ref 0.0–0.5)
HGB: 11 g/dL — ABNORMAL LOW (ref 11.6–15.9)
MCHC: 33.5 g/dL (ref 31.5–36.0)
MONO#: 0.4 10*3/uL (ref 0.1–0.9)
MONO%: 8 % (ref 0.0–14.0)
NEUT#: 3.1 10*3/uL (ref 1.5–6.5)
Platelets: 374 10*3/uL (ref 145–400)
RBC: 3.4 10*6/uL — ABNORMAL LOW (ref 3.70–5.45)
RDW: 15.3 % — ABNORMAL HIGH (ref 11.2–14.5)
WBC: 5.4 10*3/uL (ref 3.9–10.3)

## 2012-11-08 LAB — COMPREHENSIVE METABOLIC PANEL (CC13)
ALT: 23 U/L (ref 0–55)
AST: 22 U/L (ref 5–34)
Albumin: 2.8 g/dL — ABNORMAL LOW (ref 3.5–5.0)
Alkaline Phosphatase: 44 U/L (ref 40–150)
CO2: 20 mEq/L — ABNORMAL LOW (ref 22–29)
Creatinine: 1 mg/dL (ref 0.6–1.1)
Glucose: 117 mg/dl (ref 70–140)
Potassium: 4 mEq/L (ref 3.5–5.1)
Sodium: 141 mEq/L (ref 136–145)
Total Protein: 5.9 g/dL — ABNORMAL LOW (ref 6.4–8.3)

## 2012-11-08 MED ORDER — CAPECITABINE 500 MG PO TABS: 1150.0000 mg/m2 | ORAL_TABLET | Freq: Two times a day (BID) | ORAL | Status: DC

## 2012-11-08 NOTE — Telephone Encounter (Signed)
, °

## 2012-11-08 NOTE — Progress Notes (Signed)
Patient came in today for drain removal s/p bilateral mastectomy.  After cleaning the skin with chlorhexidine, I cut the retaining suture, uncharged the drain, and removed the drains.  I removed drain #2 and #3.  I left in place drain #1 due to higher than expected output and after consulting Dr. Dwain Sarna about the patient.  The two drains that were removed were putting our 15 cc for 2 days straight.  The incisions were clean, no redness, and only slightly tender due to stitch irritation.  The patient handled the procedure well and was instructed to call our office on Thursday to inform us of the drain output for drain #1.

## 2012-11-08 NOTE — Telephone Encounter (Signed)
Patient called to report that her drains are draining less than 30cc for the last couple days.  Patient scheduled for nurse only visit this afternoon to have them removed.

## 2012-11-09 ENCOUNTER — Ambulatory Visit (HOSPITAL_COMMUNITY)
Admission: RE | Admit: 2012-11-09 | Discharge: 2012-11-09 | Disposition: A | Discharge status: Home / Self Care | Payer: BC Managed Care – PPO | Source: Ambulatory Visit | Attending: Internal Medicine | Admitting: Internal Medicine

## 2012-11-09 VITALS — BP 130/80 | HR 88 | Wt 240.8 lb

## 2012-11-09 DIAGNOSIS — C50419 Malignant neoplasm of upper-outer quadrant of unspecified female breast: Secondary | ICD-10-CM | POA: Insufficient documentation

## 2012-11-09 DIAGNOSIS — R609 Edema, unspecified: Secondary | ICD-10-CM

## 2012-11-09 DIAGNOSIS — R600 Localized edema: Secondary | ICD-10-CM

## 2012-11-09 NOTE — Patient Instructions (Signed)
Limit your salt intake. Keep your legs elevated. Wear compressing stockings.  Ok to take lasix 20 mg (with potassium) once a week for severe leg swelling.   Return to clinic in 3 months. Call if swelling not better.

## 2012-11-09 NOTE — Progress Notes (Signed)
Location of Breast Cancer  :  Right Breast, stage IV  Histology per Pathology Report:  05/09/12:1. Breast, right, needle core biopsy, 11-12 o'clock- INVASIVE DUCTAL CARCINOMA WITH CALCIFICATIONS.2. Lymph node, needle/core biopsy, right axillary- METASTATIC CARCINOMA IN 1 OF 1 LYMPH NODE (1/1). 05/23/12:Skin-Punch, Right breast- BENIGN SKIN WITH UNDERLYING NESTS OF INVASIVE DUCTAL CARCINOMA INVOLVING THE DERMIS.- DERMAL LYMPHATIC INVASION IS IDENTIFIED.  Receptor Status: ER(+), PR (+), Her2-neu (-)  Did patient present with symptoms (if so, please note symptoms) or was this found on screening mammography?patient found large mass right breast, also noticed, breast appeared red and smaller Past/Anticipated interventions by surgeon, if WJX:BJYNWGNFA: 10/26/12:1. Breast, simple mastectomy, left- BENIGN BREAST TISSUE WITH FIBROCYSTIC CHANGES.- NO ATYPIA OR MALIGNANCY.- RESECTION MARGINS, NEGATIVE FOR ATYPIA OR MALIGNANCY.2. Breast, modified radical mastectomy , right- RESIDUAL INVASIVE DUCTAL CARCINOMA, NOTTINGHAM COMBINED HISTOLOGIC GRADE II,MULTIPLE FOCI, LARGEST FOCUS 3.8 CM, INVOLVING THE DERMIS AND NIPPLE.- ANGIOLYMPHATIC INVASION PRESENT.- FIVE OF FIVE NODES, POSITIVE FOR RESIDUAL METASTATIC CARCINOMA (5/5).3. Skin , Right, Axilla mass- SKIN AND SUBCUTANEOUS TISSUE WITH ASSOCIATED ACUTE ABSCESS.  Past/Anticipated interventions by medical oncology, if any: Chemotherapy patient Underwent 4 cycles of Adriamycin/Cytoxan from 05/27/12 through 07/08/12 followed by Taxol weekly x12 weeks starting 07/22/12 through 09/23/12. The Taxol was discontinued after 10 cycles due to rash. No chemotherapy since that time. Underwent double mastectomy with Dr. Dwain Sarna in 9/14.   F/u appt Dr.Khan 12/12/12,to continue the Zoladex injectins as well as xgeva every monthly     [facility] Signed Ignacia Marvel 11/08/2012 2:43 PM         Progress Notes    Patient came in today for drain removal s/p bilateral  mastectomy. After cleaning the skin with chlorhexidine, I cut the retaining suture, uncharged the drain, and removed the drains. I removed drain #2 and #3. I left in place drain #1 due to higher than expected output and after consulting Dr. Dwain Sarna about the patient. The two drains that were removed were putting our 15 cc for 2 days straight. The incisions were clean, no redness, and only slightly tender due to stitch irritation. The patient handled the procedure well and was instructed to call our office on Thursday to inform us of the drain output for drain #1.        Lymphedema issues, if any:  None lymphedema class 11/14/12  Pain issues, if any: none SAFETY ISSUES: yes,   Prior radiation? no  Pacemaker/ICD? no  Possible current pregnancy?no  Is the patient on methotrexate? no  Current Complaints / other details:  Still has 1 j/p drain below right chest wall, LE edema #3-4, , Insomnia,neuropathy s/p chemotherapy,wears ted hose during certain hours   , f/u appt Dr.Khan 12/12/12,   Lowella Petties, RN 11/09/2012,3:54 PM
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
## 2012-11-10 ENCOUNTER — Ambulatory Visit
Admission: RE | Admit: 2012-11-10 | Discharge: 2012-11-10 | Disposition: A | Discharge status: Home / Self Care | Payer: BC Managed Care – PPO | Source: Ambulatory Visit | Attending: Radiation Oncology | Admitting: Radiation Oncology

## 2012-11-10 ENCOUNTER — Other Ambulatory Visit: Payer: Self-pay | Admitting: Adult Health

## 2012-11-10 ENCOUNTER — Encounter: Payer: Self-pay | Admitting: Radiation Oncology

## 2012-11-10 ENCOUNTER — Other Ambulatory Visit: Payer: Self-pay | Admitting: *Deleted

## 2012-11-10 VITALS — BP 147/88 | HR 100 | Temp 98.1°F | Resp 20 | Ht 62.0 in | Wt 239.6 lb

## 2012-11-10 DIAGNOSIS — Z880 Allergy status to penicillin: Secondary | ICD-10-CM | POA: Insufficient documentation

## 2012-11-10 DIAGNOSIS — C50419 Malignant neoplasm of upper-outer quadrant of unspecified female breast: Secondary | ICD-10-CM

## 2012-11-10 DIAGNOSIS — C50919 Malignant neoplasm of unspecified site of unspecified female breast: Secondary | ICD-10-CM | POA: Insufficient documentation

## 2012-11-10 MED ORDER — CAPECITABINE 500 MG PO TABS: 1150.0000 mg/m2 | ORAL_TABLET | Freq: Two times a day (BID) | ORAL | Status: DC

## 2012-11-10 NOTE — Progress Notes (Signed)
Please see the Nurse Progress Note in the MD Initial Consult Encounter for this patient. 

## 2012-11-11 ENCOUNTER — Ambulatory Visit (INDEPENDENT_AMBULATORY_CARE_PROVIDER_SITE_OTHER): Payer: BC Managed Care – PPO | Admitting: General Surgery

## 2012-11-11 ENCOUNTER — Encounter (INDEPENDENT_AMBULATORY_CARE_PROVIDER_SITE_OTHER): Payer: Self-pay | Admitting: General Surgery

## 2012-11-11 VITALS — BP 140/92 | HR 82 | Temp 97.0°F | Resp 16 | Ht 62.0 in | Wt 236.4 lb

## 2012-11-11 DIAGNOSIS — Z09 Encounter for follow-up examination after completed treatment for conditions other than malignant neoplasm: Secondary | ICD-10-CM

## 2012-11-11 NOTE — Progress Notes (Signed)
Subjective:     Patient ID: Cheryl Moore, female   DOB: 1960/04/19, 52 y.o.   MRN: [id number]  HPI 45 yof who has undergone bilateral mastectomies and has one drain left.  She is doing well and returns with drain putting out less than 30 cc per day.  Review of Systems     Objective:   Physical Exam Bilateral incisions healing well without infection, drain with minimal serous fluid    Assessment:     S/p bilateral mastectomies     Plan:     I removed last remaining drain today.  Will see for wound check in 2 weeks prior to starting radiation therapy.

## 2012-11-11 NOTE — Progress Notes (Signed)
Radiation Oncology         [phone] ________________________________  Name: Cheryl Moore MRN: [id number]  Date: 11/10/2012  DOB: Jul 03, 1960  Follow-Up Visit Note  CC: No PCP Per Patient  Victorino December, MD  Diagnosis:   Stage IV breast cancer, right sided  Interval Since Last Radiation:  Not applicable   Narrative:  The patient returns today for routine follow-up.  The patient unfortunately has been diagnosed with stage IV disease with bony metastasis. She has proceeded with chemotherapy since she was initially seen followed by bilateral mastectomy. No malignancy on the left. On the right, the patient had multiple foci of invasive ductal carcinoma with the largest focus measuring 3.8 cm. Angiolymphatic invasion is present. 5 out of 5 lymph nodes were present and extracapsular extension was present as well. The patient's case has been discussed in multidisciplinary breast conference and she presents today for consideration of possible radiation treatment.                              ALLERGIES:  is allergic to penicillins.  Meds: Current Outpatient Prescriptions  Medication Sig Dispense Refill  . ALPRAZolam (XANAX) 0.25 MG tablet Take 1 tablet (0.25 mg total) by mouth 3 (three) times daily as needed for anxiety.  30 tablet  3  . Ascorbic Acid (VITAMIN C PO) Take 1 tablet by mouth daily.      Marland Kitchen CALCIUM PO Take 1 tablet by mouth daily.      Marland Kitchen gabapentin (NEURONTIN) 100 MG capsule Take 100 mg by mouth 3 (three) times daily.      Marland Kitchen ibuprofen (ADVIL,MOTRIN) 200 MG tablet Take 200-400 mg by mouth every 8 (eight) hours as needed for pain.       Marland Kitchen LORazepam (ATIVAN) 0.5 MG tablet Take 1 tablet (0.5 mg total) by mouth every 8 (eight) hours.  30 tablet  0  . metoprolol tartrate (LOPRESSOR) 25 MG tablet Take 0.5 tablets (12.5 mg total) by mouth 2 (two) times daily.  60 tablet  0  . Multiple Vitamin (MULTIVITAMIN) tablet Take 1 tablet by mouth daily.      . pantoprazole (PROTONIX) 40 MG  tablet Take 1 tablet (40 mg total) by mouth daily.  30 tablet  3  . UNABLE TO FIND Rx: L8015-Post Mastectomy Camisole (Quantity: 2) L8000- Post Surgical Bras (Quantity: 6) L8020- Non-Silicone Breast Prosthesis (Quantity: 2) W0981- Silicone Breast Prosthesis (Quantity: 2) Dx: 174.9; Bilateral mastectomy  1 each  0  . valACYclovir (VALTREX) 500 MG tablet Take 1 tablet (500 mg total) by mouth daily.  30 tablet  5  . capecitabine (XELODA) 500 MG tablet Take 4 tablets (2,000 mg total) by mouth 2 (two) times daily after a meal.  250 tablet  4  . gabapentin (NEURONTIN) 100 MG capsule TAKE 1 CAPSULE (100 MG TOTAL) BY MOUTH 3 (THREE) TIMES DAILY.  90 capsule  0   No current facility-administered medications for this encounter.    Physical Findings: The patient is in no acute distress. Patient is alert and oriented.  height is 5\' 2"  (1.575 m) and weight is 239 lb 9.6 oz (108.682 kg). Her oral temperature is 98.1 F (36.7 C). Her blood pressure is 147/88 and her pulse is 100. Her respiration is 20. .   Status post bilateral mastectomy. The surgical incisions are healing well. One drains still in place on the right with some serosanguineous drainage  Lab Findings: Lab  Results  Component Value Date   WBC 5.4 11/08/2012   HGB 11.0* 11/08/2012   HCT 32.8* 11/08/2012   MCV 96.7 11/08/2012   PLT 374 11/08/2012     Radiographic Findings: No results found.  Impression:    The patient had her case discussed in multidisciplinary breast conference. She is fairly gone with bone only disease and the consensus recommendation was to proceed with postmastectomy radiotherapy on the right. I discussed this with the patient which would include a 6-1/2 week course of treatment. I discussed the potential benefit in terms of improved local/regional control. All of her questions were answered. We did discuss the potential side effects and risks of treatment as well. She does wish to proceed with this treatment  plan.  In reviewing the patient's PET scan, she does have one bony area which I believe would be reasonable to treat with palliative radiation concurrently. This involves the right acetabular region. Significant structural change was not seen, but this is an area with weakening which could cause pain and decreased function. I therefore discussed concurrent palliative treatment to this area with the patient and she was interested in this for prevention of future problems.  Plan:  The patient will proceed with a simulation in approximately 1 week and I anticipate beginning her course of radiation treatment in approximately 2-1/2 weeks. We will treat her with postmastectomy radiation for 6-1/2 weeks and she will also receive concurrent palliative radiation treatment to the right hip for the first 3 weeks. She has been seen by medical oncology and the plan is also to have the patient receive concurrent Xeloda during her radiation treatment.  I spent 30 minutes with the patient today, the majority of which was spent counseling the patient on the diagnosis of cancer and coordinating care.   Radene Gunning, M.D., Ph.D.
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
## 2012-11-17 ENCOUNTER — Ambulatory Visit
Admission: RE | Admit: 2012-11-17 | Discharge: 2012-11-17 | Disposition: A | Discharge status: Home / Self Care | Payer: BC Managed Care – PPO | Source: Ambulatory Visit | Attending: Radiation Oncology | Admitting: Radiation Oncology

## 2012-11-17 DIAGNOSIS — F411 Generalized anxiety disorder: Secondary | ICD-10-CM | POA: Insufficient documentation

## 2012-11-17 DIAGNOSIS — L539 Erythematous condition, unspecified: Secondary | ICD-10-CM | POA: Insufficient documentation

## 2012-11-17 DIAGNOSIS — Z79899 Other long term (current) drug therapy: Secondary | ICD-10-CM | POA: Insufficient documentation

## 2012-11-17 DIAGNOSIS — Z9010 Acquired absence of unspecified breast and nipple: Secondary | ICD-10-CM | POA: Insufficient documentation

## 2012-11-17 DIAGNOSIS — C50419 Malignant neoplasm of upper-outer quadrant of unspecified female breast: Secondary | ICD-10-CM

## 2012-11-17 DIAGNOSIS — Z510 Encounter for antineoplastic radiation therapy: Secondary | ICD-10-CM | POA: Insufficient documentation

## 2012-11-17 DIAGNOSIS — R5381 Other malaise: Secondary | ICD-10-CM | POA: Insufficient documentation

## 2012-11-17 DIAGNOSIS — C50919 Malignant neoplasm of unspecified site of unspecified female breast: Secondary | ICD-10-CM | POA: Insufficient documentation

## 2012-11-17 NOTE — Addendum Note (Signed)
Encounter addended by: Jonna Coup, MD on: 11/17/2012  6:01 PM<BR>     Documentation filed: Notes Section

## 2012-11-17 NOTE — Progress Notes (Addendum)
  Radiation Oncology         [phone] ________________________________  Name: Cheryl Moore MRN: [id number]  Date: 11/17/2012  DOB: 1960/09/17   SIMULATION AND TREATMENT PLANNING NOTE  The patient presented for simulation prior to beginning her course of radiation treatment for her diagnosis of right-sided breast cancer. The patient will be treated to 2 separate areas including the right chest wall and supraclavicular region as far as postmastectomy radiation, as well as palliative treatment to the right hip/acetabular region. The patient was placed in a supine position on a breast board. A customized accuform device was also constructed and this complex treatment device will be used on a daily basis during her treatment. In this fashion, a CT scan was obtained through the chest area and an isocenter was placed near the chest wall at the upper aspect of the right chest. The patient was repositioned and a customized VAC lock bag was constructed to help with the patient immobilization. This complex treatment device will be used daily. A CT scan was also obtained through the pelvic region and an isocenter was placed within the right hip region.  The patient will be planned to receive a course of radiation initially to a dose of 50.4 gray. This will consist of a 4 field, 3-D conformal technique targeting the chest wall as well as the supraclavicular region. Therefore 2 customized medial and lateral tangent fields have been created targeting the chest wall, and also 2 additional customized fields have been designed to treat the supraclavicular region both with a right supraclavicular field and a right posterior axillary boost field. This treatment will be accomplished at 1.8 gray per fraction. A complex isodose plan is requested to ensure that the target area is adequately covered dosimetrically. A forward planning technique will also be evaluated to determine if this approach improves the plan. It is  anticipated that the patient will then receive a 10 gray boost to the scar plus margin. This will be accomplished at 2 gray per fraction. The final anticipated total dose therefore will correspond to 60.4 gray. The patient's mastectomy scar has been contoured as a target structure. Dose to the lungs will be evaluated. A forward planned technique will also be utilized as necessary for dose homogeneity. This therefore constitutes a 3-D conformal technique.  The patient will also receive a total of 37.5 gray concurrently to the right hip at 2.5 gray per fraction. A total of 3 customized fields/blocks have been designed to treat this separate area.    _______________________________   Radene Gunning, MD, PhD

## 2012-11-19 NOTE — Discharge Summary (Signed)
Physician Discharge Summary  Patient ID: Cheryl Moore MRN: [id number] DOB/AGE: 1960-09-01 52 y.o.  Admit date: 10/26/2012 Discharge date: 11/19/2012  Admission Diagnoses: Stage IV breast cancer Morbid obesity  Discharge Diagnoses:  Active Problems:   * No active hospital problems. *   Discharged Condition: good  Hospital Course: 89 yof who was diagnosed with bone only stage IV breast cancer. She has done well with systemic therapy and very much desires to have local control. She has been discussed in multidisciplinary fashion and she understands there is no medical advantage to bilateral mastectomy.  She very much wants to pursue this. She underwent bilateral mastectomy without reconstruction.  She did well.  Postoperatively she remained until she was tolerating a regular diet, pain controlled and drains were appropriate.   Consults: None  Significant Diagnostic Studies: none  Treatments: surgery: bilateral mastectomies   Disposition: 01-Home or Self Care   Future Appointments Provider Department Dept Phone   11/21/2012 10:15 AM Nonda Lou, PT OUTPATIENT CANCER [facility] [address] [phone]   11/25/2012 11:45 AM Jonna Coup, MD [facility] RAD[facility] ONCOL[facility] [phone]   Joint Appt [facility] [id number] [place] [facility] [phone]   11/28/2012 3:30 PM Emelia Loron, MD [facility], [place] [phone]   11/28/2012 6:30 PM [facility] [id number] [place] [facility] [phone]   11/29/2012 5:45 PM [facility] [id number] [place] [facility] [phone]   11/30/2012 3:45 PM [facility] [id number] [place] [facility] [phone]   12/01/2012 5:15 PM [facility] [id number] [place] [facility] [phone]   12/02/2012 1:05 PM [facility] [id number] [place] [facility] [phone]   12/05/2012 1:05 PM [facility] [id number] [place] [facility]COL[facility] [phone]   12/06/2012 1:05 PM [facility] [id number] [place] [facility] [phone]   12/07/2012 1:05 PM [facility] [id number] [place] [facility] [phone]   12/08/2012 1:05 PM [facility] [id number] [place] [facility] [phone]   12/09/2012 1:05 PM [facility] [id number] [place] [facility] [phone]   12/12/2012 11:15 AM Victorino December, MD [place] [facility] MEDICAL ONCOLOGY [phone]   12/12/2012 1:05 PM [facility] [id number] [place] [facility] RAD[facility]COL[facility] [phone]   12/13/2012 1:05 PM [facility] [id number] [place] [facility] [phone]   12/14/2012 1:05 PM [facility] [id number] [place] [facility] [phone]   12/15/2012 1:05 PM [facility] [id number] [place] [facility] [phone]   12/16/2012 1:05 PM [facility] [id number] [place] [facility] [phone]   12/19/2012 1:05 PM [facility] [id number] [place] [facility] [phone]   12/20/2012 1:05 PM [facility] [id number] [place] [facility] [phone]   12/21/2012 1:05 PM [facility] [id number] [place] [facility] [phone]   12/26/2012 1:05 PM [facility] [id number] [place] [facility] RAD[facility]COLOG[facility] [phone]   12/27/2012 1:05 PM [facility] [id number] [place] [facility]COL[facility] [phone]   12/28/2012 1:05 PM [facility] [id number] [place] [facility] [phone]   12/29/2012 1:05 PM [facility] [id number] [place] [facility]COL[facility] [phone]   12/30/2012 1:05 PM [facility] [id number] [place] [facility] RAD[facility]COL[facility] [phone]   01/02/2013 1:05 PM  [facility] [id number]  [facility] RADIATION ONCOLOGY [phone]   01/03/2013 1:05 PM [facility] [id number]  [facility] RAD[facility]ATION ONCOLOGY [phone]   01/04/2013 1:05 PM [facility] [id number]  [facility] RADIATION ONCOLOGY [phone]  01/05/2013 1:05 PM [facility] [id number] [place] [facility] RAD[facility] [phone]   01/06/2013 1:05 PM [facility] [id number] [place] [facility] [phone]   01/09/2013 1:05 PM [facility] [id number] [place] [facility] [phone]   01/10/2013 1:05 PM [facility] [id number] [place] [facility] RAD[facility] ONCOL[facility] [phone]   01/11/2013 1:05 PM [facility] [id number] [place] [facility] RAD[facility]COL[facility] [phone]   01/12/2013 1:05 PM [facility] [id number] [place] [facility] RAD[facility] ONCOL[facility] [phone]   01/13/2013 1:05 PM [facility] [id number]  [facility] RADIATION ONCOLOGY [phone]   05/03/2013 1:45 PM Melony Overly, MD [facility] [phone]       Medication List    STOP taking these medications       furosemide 20 MG tablet  Commonly known as:  LASIX      TAKE these medications       ALPRAZolam 0.25 MG tablet  Commonly known as:  XANAX  Take 1 tablet (0.25 mg total) by mouth 3 (three) times daily as needed for anxiety.     CALCIUM PO  Take 1 tablet by mouth daily.     gabapentin 100 MG capsule  Commonly known as:  NEURONTIN  Take 100 mg by mouth 3 (three) times daily.     ibuprofen 200 MG tablet  Commonly known as:  ADVIL,MOTRIN  Take 200-400 mg by mouth every 8 (eight) hours as needed for pain.     LORazepam 0.5 MG tablet  Commonly known as:  ATIVAN  Take 1 tablet (0.5 mg total) by mouth every 8 (eight) hours.     metoprolol tartrate 25 MG tablet  Commonly known as:  LOPRESSOR  Take 0.5 tablets (12.5 mg total) by mouth 2 (two) times daily.     multivitamin  tablet  Take 1 tablet by mouth daily.     pantoprazole 40 MG tablet  Commonly known as:  PROTONIX  Take 1 tablet (40 mg total) by mouth daily.     UNABLE TO FIND  - Rx: L8015-Post Mastectomy Camisole (Quantity: 2)  - L8000- Post Surgical Bras (Quantity: 6)  - F8182- Non-Silicone Breast Prosthesis (Quantity: 2)  - X9371- Silicone Breast Prosthesis (Quantity: 2)  - Dx: 174.9; Bilateral mastectomy     valACYclovir 500 MG tablet  Commonly known as:  VALTREX  Take 1 tablet (500 mg total) by mouth daily.     VITAMIN C PO  Take 1 tablet by mouth daily.           Follow-up Information   Follow up with [facility], MD In 1 week.   Specialty:  General Surgery   Contact information:   [address] [place] [address] [phone]       Signed: Emelia Loron 11/19/2012, 3:56 PM

## 2012-11-21 ENCOUNTER — Ambulatory Visit: Payer: BC Managed Care – PPO | Attending: Oncology | Admitting: Physical Therapy

## 2012-11-21 DIAGNOSIS — C7951 Secondary malignant neoplasm of bone: Secondary | ICD-10-CM | POA: Insufficient documentation

## 2012-11-21 DIAGNOSIS — C50919 Malignant neoplasm of unspecified site of unspecified female breast: Secondary | ICD-10-CM | POA: Insufficient documentation

## 2012-11-21 DIAGNOSIS — IMO0001 Reserved for inherently not codable concepts without codable children: Secondary | ICD-10-CM | POA: Insufficient documentation

## 2012-11-21 DIAGNOSIS — I890 Lymphedema, not elsewhere classified: Secondary | ICD-10-CM | POA: Insufficient documentation

## 2012-11-21 NOTE — Addendum Note (Signed)
Encounter addended by: Lowella Petties, RN on: 11/21/2012  9:32 AM<BR>     Documentation filed: Charges VN

## 2012-11-22 ENCOUNTER — Other Ambulatory Visit: Payer: Self-pay | Admitting: Adult Health

## 2012-11-24 ENCOUNTER — Other Ambulatory Visit: Payer: Self-pay | Admitting: *Deleted

## 2012-11-24 DIAGNOSIS — C50411 Malignant neoplasm of upper-outer quadrant of right female breast: Secondary | ICD-10-CM

## 2012-11-24 DIAGNOSIS — N6310 Unspecified lump in the right breast, unspecified quadrant: Secondary | ICD-10-CM

## 2012-11-24 MED ORDER — LORAZEPAM 0.5 MG PO TABS: 0.5000 mg | ORAL_TABLET | Freq: Three times a day (TID) | ORAL | Status: DC | PRN

## 2012-11-25 ENCOUNTER — Ambulatory Visit
Admission: RE | Admit: 2012-11-25 | Discharge: 2012-11-25 | Disposition: A | Discharge status: Home / Self Care | Payer: BC Managed Care – PPO | Source: Ambulatory Visit | Attending: Radiation Oncology | Admitting: Radiation Oncology

## 2012-11-25 DIAGNOSIS — C50411 Malignant neoplasm of upper-outer quadrant of right female breast: Secondary | ICD-10-CM

## 2012-11-27 NOTE — Progress Notes (Signed)
  Radiation Oncology         [phone]) [phone] ________________________________  Name: Cheryl Moore MRN: [id number]  Date: 11/25/2012  DOB: 13-Jul-1960  Simulation Verification Note   NARRATIVE: The patient was brought to the treatment unit and placed in the planned treatment position. The clinical setup was verified. Then port films were obtained and uploaded to the radiation oncology medical record software.  The treatment beams were carefully compared against the planned radiation fields. The position, location, and shape of the radiation fields was reviewed. The targeted volume of tissue appears to be appropriately covered by the radiation beams. Based on my personal review, I approved the simulation verification. The patient's treatment will proceed as planned.  ________________________________   Radene Gunning, MD, PhD

## 2012-11-28 ENCOUNTER — Ambulatory Visit: Payer: BC Managed Care – PPO | Attending: Oncology | Admitting: Physical Therapy

## 2012-11-28 ENCOUNTER — Ambulatory Visit
Admission: RE | Admit: 2012-11-28 | Discharge: 2012-11-28 | Disposition: A | Discharge status: Home / Self Care | Payer: BC Managed Care – PPO | Source: Ambulatory Visit | Attending: Radiation Oncology | Admitting: Radiation Oncology

## 2012-11-28 ENCOUNTER — Ambulatory Visit: Payer: BC Managed Care – PPO

## 2012-11-28 ENCOUNTER — Ambulatory Visit (INDEPENDENT_AMBULATORY_CARE_PROVIDER_SITE_OTHER): Payer: BC Managed Care – PPO | Admitting: General Surgery

## 2012-11-28 ENCOUNTER — Encounter (INDEPENDENT_AMBULATORY_CARE_PROVIDER_SITE_OTHER): Payer: Self-pay | Admitting: General Surgery

## 2012-11-28 VITALS — BP 128/82 | HR 84 | Resp 18 | Ht 62.0 in | Wt 233.0 lb

## 2012-11-28 DIAGNOSIS — C50919 Malignant neoplasm of unspecified site of unspecified female breast: Secondary | ICD-10-CM | POA: Insufficient documentation

## 2012-11-28 DIAGNOSIS — Z09 Encounter for follow-up examination after completed treatment for conditions other than malignant neoplasm: Secondary | ICD-10-CM

## 2012-11-28 DIAGNOSIS — C7951 Secondary malignant neoplasm of bone: Secondary | ICD-10-CM | POA: Insufficient documentation

## 2012-11-28 DIAGNOSIS — IMO0001 Reserved for inherently not codable concepts without codable children: Secondary | ICD-10-CM | POA: Insufficient documentation

## 2012-11-28 DIAGNOSIS — I890 Lymphedema, not elsewhere classified: Secondary | ICD-10-CM | POA: Insufficient documentation

## 2012-11-28 NOTE — Progress Notes (Signed)
Subjective:     Patient ID: Cheryl Moore, female   DOB: [birth year], 52 y.o.   MRN: [id number]  HPI This is a 52 year old female who has stage IV breast cancer who underwent a bilateral mastectomy. Her drains have all been removed. She is due to begin radiation therapy later today. She is doing well overall. She has a small superficial separation on her right side. Is a little bit of fluid on the right side and she really has not noticed much at all.  Review of Systems     Objective:   Physical Exam 2 mm superficial separation of the right-sided incision, there is a medium sized seroma that is present on this side as well, there is no infection otherwise this is healing well On the left side this is healing well without seroma or infection    Assessment:     Right-sided seroma with small superficial wound separation Status post bilateral mastectomy     Plan:     I think he should heal pretty quickly on its own. It looks like it may just been a reaction to a stitch. There is no infection. I did cleanse this area and aspirate 60 cc of a seroma on the right side. I told her to call me back if this recurs or she notices anything in the next several weeks I will plan on seeing her at 4 weeks.

## 2012-11-29 ENCOUNTER — Ambulatory Visit
Admission: RE | Admit: 2012-11-29 | Discharge: 2012-11-29 | Disposition: A | Discharge status: Home / Self Care | Payer: BC Managed Care – PPO | Source: Ambulatory Visit | Attending: Radiation Oncology | Admitting: Radiation Oncology

## 2012-11-30 ENCOUNTER — Ambulatory Visit
Admission: RE | Admit: 2012-11-30 | Discharge: 2012-11-30 | Disposition: A | Discharge status: Home / Self Care | Payer: BC Managed Care – PPO | Source: Ambulatory Visit | Attending: Radiation Oncology | Admitting: Radiation Oncology

## 2012-11-30 ENCOUNTER — Encounter: Payer: Self-pay | Admitting: Radiation Oncology

## 2012-11-30 VITALS — BP 157/88 | HR 100 | Temp 98.1°F | Resp 20 | Wt 229.5 lb

## 2012-11-30 DIAGNOSIS — N6310 Unspecified lump in the right breast, unspecified quadrant: Secondary | ICD-10-CM

## 2012-11-30 DIAGNOSIS — C50411 Malignant neoplasm of upper-outer quadrant of right female breast: Secondary | ICD-10-CM

## 2012-11-30 MED ORDER — ALRA NON-METALLIC DEODORANT (RAD-ONC)
1.0000 "application " | Freq: Once | TOPICAL | Status: AC
  Administered 2012-11-30: 1 via TOPICAL

## 2012-11-30 MED ORDER — RADIAPLEXRX EX GEL
Freq: Once | CUTANEOUS | Status: AC
  Administered 2012-11-30: 16:00:00 via TOPICAL

## 2012-11-30 NOTE — Progress Notes (Signed)
Department of Radiation Oncology  Phone:  [phone] Fax:        [phone]  Weekly Treatment Note    Name: Cheryl Moore Date: 11/30/2012 MRN: [id number] DOB: 11-29-1960   Current dose: 5.4 Gy  Current fraction: 3   MEDICATIONS: Current Outpatient Prescriptions  Medication Sig Dispense Refill  . ALPRAZolam (XANAX) 0.25 MG tablet Take 1 tablet (0.25 mg total) by mouth 3 (three) times daily as needed for anxiety.  30 tablet  3  . Ascorbic Acid (VITAMIN C PO) Take 1 tablet by mouth daily.      Marland Kitchen CALCIUM PO Take 1 tablet by mouth daily.      . capecitabine (XELODA) 500 MG tablet Take 4 tablets (2,000 mg total) by mouth 2 (two) times daily after a meal.  250 tablet  4  . gabapentin (NEURONTIN) 100 MG capsule TAKE 1 CAPSULE (100 MG TOTAL) BY MOUTH 3 (THREE) TIMES DAILY.  90 capsule  0  . [START ON 12/01/2012] hyaluronate sodium (RADIAPLEXRX) GEL Apply 1 application topically 2 (two) times daily. Apply after rad tx and in am after showe/bath      . ibuprofen (ADVIL,MOTRIN) 200 MG tablet Take 200-400 mg by mouth every 8 (eight) hours as needed for pain.       Marland Kitchen LORazepam (ATIVAN) 0.5 MG tablet Take 1 tablet (0.5 mg total) by mouth every 8 (eight) hours as needed for anxiety.  30 tablet  0  . metoprolol tartrate (LOPRESSOR) 25 MG tablet Take 0.5 tablets (12.5 mg total) by mouth 2 (two) times daily.  60 tablet  0  . Multiple Vitamin (MULTIVITAMIN) tablet Take 1 tablet by mouth daily.      Melene Muller ON 12/01/2012] non-metallic deodorant (ALRA) MISC Apply 1 application topically daily.      . pantoprazole (PROTONIX) 40 MG tablet Take 1 tablet (40 mg total) by mouth daily.  30 tablet  3  . valACYclovir (VALTREX) 500 MG tablet Take 1 tablet (500 mg total) by mouth daily.  30 tablet  5  . UNABLE TO FIND Rx: L8015-Post Mastectomy Camisole (Quantity: 2) L8000- Post Surgical Bras (Quantity: 6) L8020- Non-Silicone Breast Prosthesis (Quantity: 2) V9563- Silicone Breast Prosthesis (Quantity:  2) Dx: 174.9; Bilateral mastectomy  1 each  0   No current facility-administered medications for this encounter.     ALLERGIES: Penicillins   LABORATORY DATA:  Lab Results  Component Value Date   WBC 5.4 11/08/2012   HGB 11.0* 11/08/2012   HCT 32.8* 11/08/2012   MCV 96.7 11/08/2012   PLT 374 11/08/2012   Lab Results  Component Value Date   NA 141 11/08/2012   K 4.0 11/08/2012   CL 107 11/03/2012   CO2 20* 11/08/2012   Lab Results  Component Value Date   ALT 23 11/08/2012   AST 22 11/08/2012   ALKPHOS 44 11/08/2012   BILITOT 0.24 11/08/2012     NARRATIVE: MALASHA KLEPPE was seen today for weekly treatment management. The chart was checked and the patient's films were reviewed. The patient is doing well. No problems with her treatment so far. The patient has been given Transport planner. All of her questions were answered today.  PHYSICAL EXAMINATION: weight is 229 lb 8 oz (104.101 kg). Her oral temperature is 98.1 F (36.7 C). Her blood pressure is 157/88 and her pulse is 100. Her respiration is 20.        ASSESSMENT: The patient is doing satisfactorily with treatment.  PLAN: We will continue  with the patient's radiation treatment as planned.

## 2012-11-30 NOTE — Progress Notes (Signed)
Weekly rad txs has had 2 on right subclavian and rt hip so far,pt education done, alra, radiaplex gel, radiation book given, , discusses fatigue,pain, skin irritation, diet, increase protein, no c/o pain, energy level good,teach back 4:25 PM

## 2012-12-01 ENCOUNTER — Ambulatory Visit
Admission: RE | Admit: 2012-12-01 | Discharge: 2012-12-01 | Disposition: A | Discharge status: Home / Self Care | Payer: BC Managed Care – PPO | Source: Ambulatory Visit | Attending: Radiation Oncology | Admitting: Radiation Oncology

## 2012-12-01 ENCOUNTER — Other Ambulatory Visit: Payer: Self-pay

## 2012-12-02 ENCOUNTER — Ambulatory Visit
Admission: RE | Admit: 2012-12-02 | Discharge: 2012-12-02 | Disposition: A | Discharge status: Home / Self Care | Payer: BC Managed Care – PPO | Source: Ambulatory Visit | Attending: Radiation Oncology | Admitting: Radiation Oncology

## 2012-12-05 ENCOUNTER — Encounter: Payer: Self-pay | Admitting: Radiation Oncology

## 2012-12-05 ENCOUNTER — Ambulatory Visit
Admission: RE | Admit: 2012-12-05 | Discharge: 2012-12-05 | Disposition: A | Discharge status: Home / Self Care | Payer: BC Managed Care – PPO | Source: Ambulatory Visit | Attending: Radiation Oncology | Admitting: Radiation Oncology

## 2012-12-05 ENCOUNTER — Ambulatory Visit: Payer: BC Managed Care – PPO | Admitting: Physical Therapy

## 2012-12-06 ENCOUNTER — Ambulatory Visit
Admission: RE | Admit: 2012-12-06 | Discharge: 2012-12-06 | Disposition: A | Discharge status: Home / Self Care | Payer: BC Managed Care – PPO | Source: Ambulatory Visit | Attending: Radiation Oncology | Admitting: Radiation Oncology

## 2012-12-07 ENCOUNTER — Ambulatory Visit
Admission: RE | Admit: 2012-12-07 | Discharge: 2012-12-07 | Disposition: A | Discharge status: Home / Self Care | Payer: BC Managed Care – PPO | Source: Ambulatory Visit | Attending: Radiation Oncology | Admitting: Radiation Oncology

## 2012-12-08 ENCOUNTER — Ambulatory Visit
Admission: RE | Admit: 2012-12-08 | Discharge: 2012-12-08 | Disposition: A | Discharge status: Home / Self Care | Payer: BC Managed Care – PPO | Source: Ambulatory Visit | Attending: Radiation Oncology | Admitting: Radiation Oncology

## 2012-12-09 ENCOUNTER — Ambulatory Visit
Admission: RE | Admit: 2012-12-09 | Discharge: 2012-12-09 | Disposition: A | Discharge status: Home / Self Care | Payer: BC Managed Care – PPO | Source: Ambulatory Visit | Attending: Radiation Oncology | Admitting: Radiation Oncology

## 2012-12-09 ENCOUNTER — Encounter: Payer: Self-pay | Admitting: Radiation Oncology

## 2012-12-09 NOTE — Progress Notes (Signed)
Weekly rad txs  Rt subclavian & rt hip,10 txs done, MD saw patient on machine Lianc 2, not sent to nursing  2:05 PM

## 2012-12-12 ENCOUNTER — Ambulatory Visit: Payer: BC Managed Care – PPO | Admitting: Oncology

## 2012-12-12 ENCOUNTER — Ambulatory Visit
Admission: RE | Admit: 2012-12-12 | Discharge: 2012-12-12 | Disposition: A | Discharge status: Home / Self Care | Payer: BC Managed Care – PPO | Source: Ambulatory Visit | Attending: Radiation Oncology | Admitting: Radiation Oncology

## 2012-12-13 ENCOUNTER — Ambulatory Visit
Admission: RE | Admit: 2012-12-13 | Discharge: 2012-12-13 | Disposition: A | Discharge status: Home / Self Care | Payer: BC Managed Care – PPO | Source: Ambulatory Visit | Attending: Radiation Oncology | Admitting: Radiation Oncology

## 2012-12-14 ENCOUNTER — Ambulatory Visit
Admission: RE | Admit: 2012-12-14 | Discharge: 2012-12-14 | Disposition: A | Discharge status: Home / Self Care | Payer: BC Managed Care – PPO | Source: Ambulatory Visit | Attending: Radiation Oncology | Admitting: Radiation Oncology

## 2012-12-15 ENCOUNTER — Ambulatory Visit
Admission: RE | Admit: 2012-12-15 | Discharge: 2012-12-15 | Disposition: A | Discharge status: Home / Self Care | Payer: BC Managed Care – PPO | Source: Ambulatory Visit | Attending: Radiation Oncology | Admitting: Radiation Oncology

## 2012-12-16 ENCOUNTER — Encounter: Payer: Self-pay | Admitting: Radiation Oncology

## 2012-12-16 ENCOUNTER — Ambulatory Visit
Admission: RE | Admit: 2012-12-16 | Discharge: 2012-12-16 | Disposition: A | Discharge status: Home / Self Care | Payer: BC Managed Care – PPO | Source: Ambulatory Visit | Attending: Radiation Oncology | Admitting: Radiation Oncology

## 2012-12-16 VITALS — BP 123/82 | HR 106 | Temp 97.7°F | Resp 20 | Wt 218.8 lb

## 2012-12-16 DIAGNOSIS — C50411 Malignant neoplasm of upper-outer quadrant of right female breast: Secondary | ICD-10-CM

## 2012-12-16 NOTE — Progress Notes (Signed)
Department of Radiation Oncology  Phone:  [phone] Fax:        [phone]  Weekly Treatment Note    Name: Cheryl Moore Date: 12/16/2012 MRN: [id number] DOB: Aug 19, 1960   Current dose: 27 Gy  Current fraction: 15   MEDICATIONS: Current Outpatient Prescriptions  Medication Sig Dispense Refill  . ALPRAZolam (XANAX) 0.25 MG tablet Take 1 tablet (0.25 mg total) by mouth 3 (three) times daily as needed for anxiety.  30 tablet  3  . Ascorbic Acid (VITAMIN C PO) Take 1 tablet by mouth daily.      Marland Kitchen CALCIUM PO Take 1 tablet by mouth daily.      . capecitabine (XELODA) 500 MG tablet Take 4 tablets (2,000 mg total) by mouth 2 (two) times daily after a meal.  250 tablet  4  . gabapentin (NEURONTIN) 100 MG capsule TAKE 1 CAPSULE (100 MG TOTAL) BY MOUTH 3 (THREE) TIMES DAILY.  90 capsule  0  . hyaluronate sodium (RADIAPLEXRX) GEL Apply 1 application topically 2 (two) times daily. Apply after rad tx and in am after showe/bath      . ibuprofen (ADVIL,MOTRIN) 200 MG tablet Take 200-400 mg by mouth every 8 (eight) hours as needed for pain.       Marland Kitchen LORazepam (ATIVAN) 0.5 MG tablet Take 1 tablet (0.5 mg total) by mouth every 8 (eight) hours as needed for anxiety.  30 tablet  0  . metoprolol tartrate (LOPRESSOR) 25 MG tablet Take 0.5 tablets (12.5 mg total) by mouth 2 (two) times daily.  60 tablet  0  . Multiple Vitamin (MULTIVITAMIN) tablet Take 1 tablet by mouth daily.      . non-metallic deodorant Thornton Papas) MISC Apply 1 application topically daily.      . pantoprazole (PROTONIX) 40 MG tablet Take 1 tablet (40 mg total) by mouth daily.  30 tablet  3  . UNABLE TO FIND Rx: L8015-Post Mastectomy Camisole (Quantity: 2) L8000- Post Surgical Bras (Quantity: 6) L8020- Non-Silicone Breast Prosthesis (Quantity: 2) M0102- Silicone Breast Prosthesis (Quantity: 2) Dx: 174.9; Bilateral mastectomy  1 each  0  . valACYclovir (VALTREX) 500 MG tablet Take 1 tablet (500 mg total) by mouth daily.  30 tablet   5   No current facility-administered medications for this encounter.     ALLERGIES: Penicillins   LABORATORY DATA:  Lab Results  Component Value Date   WBC 5.4 11/08/2012   HGB 11.0* 11/08/2012   HCT 32.8* 11/08/2012   MCV 96.7 11/08/2012   PLT 374 11/08/2012   Lab Results  Component Value Date   NA 141 11/08/2012   K 4.0 11/08/2012   CL 107 11/03/2012   CO2 20* 11/08/2012   Lab Results  Component Value Date   ALT 23 11/08/2012   AST 22 11/08/2012   ALKPHOS 44 11/08/2012   BILITOT 0.24 11/08/2012     NARRATIVE: Cheryl Moore was seen today for weekly treatment management. The chart was checked and the patient's films were reviewed. The patient is doing very well. She is pleased by this , with this showing a little better I believe than expected. She finished her treatment to the right hip today. Slight fatigue.  PHYSICAL EXAMINATION: weight is 218 lb 12.8 oz (99.247 kg). Her oral temperature is 97.7 F (36.5 C). Her blood pressure is 123/82 and her pulse is 106. Her respiration is 20.        ASSESSMENT: The patient is doing satisfactorily with treatment.  PLAN: We will  continue with the patient's radiation treatment as planned.

## 2012-12-16 NOTE — Progress Notes (Signed)
Weekly rad txs, 15 rt sclav/cw , erythema only, skin intact, slight fatigue, no c/o pain, using radiaplex bid 1:52 PM

## 2012-12-18 ENCOUNTER — Ambulatory Visit
Admission: RE | Admit: 2012-12-18 | Discharge: 2012-12-18 | Disposition: A | Discharge status: Home / Self Care | Payer: BC Managed Care – PPO | Source: Ambulatory Visit | Attending: Radiation Oncology | Admitting: Radiation Oncology

## 2012-12-19 ENCOUNTER — Ambulatory Visit
Admission: RE | Admit: 2012-12-19 | Discharge: 2012-12-19 | Disposition: A | Discharge status: Home / Self Care | Payer: BC Managed Care – PPO | Source: Ambulatory Visit | Attending: Radiation Oncology | Admitting: Radiation Oncology

## 2012-12-19 ENCOUNTER — Encounter: Payer: Self-pay | Admitting: Radiation Oncology

## 2012-12-19 VITALS — BP 133/82 | HR 97 | Temp 97.4°F | Ht 62.0 in | Wt 225.2 lb

## 2012-12-19 DIAGNOSIS — C50411 Malignant neoplasm of upper-outer quadrant of right female breast: Secondary | ICD-10-CM

## 2012-12-19 NOTE — Progress Notes (Signed)
Cheryl Moore has received 17 fractions to her right breast.   She has no voiced complaints.  Bright erythema in tx field.

## 2012-12-19 NOTE — Progress Notes (Signed)
Department of Radiation Oncology  Phone:  [phone] Fax:        [phone]  Weekly Treatment Note    Name: [name] Date: 12/19/2012 MRN: [id number] DOB: [birth year]   Current dose: 30.6 Gy  Current fraction: 17   MEDICATIONS: Current Outpatient Prescriptions  Medication Sig Dispense Refill  . Ascorbic Acid (VITAMIN C PO) Take 1 tablet by mouth daily.      Marland Kitchen CALCIUM PO Take 1 tablet by mouth daily.      . capecitabine (XELODA) 500 MG tablet Take 4 tablets (2,000 mg total) by mouth 2 (two) times daily after a meal.  250 tablet  4  . gabapentin (NEURONTIN) 100 MG capsule TAKE 1 CAPSULE (100 MG TOTAL) BY MOUTH 3 (THREE) TIMES DAILY.  90 capsule  0  . hyaluronate sodium (RADIAPLEXRX) GEL Apply 1 application topically 2 (two) times daily. Apply after rad tx and in am after showe/bath      . ibuprofen (ADVIL,MOTRIN) 200 MG tablet Take 200-400 mg by mouth every 8 (eight) hours as needed for pain.       . non-metallic deodorant Thornton Papas) MISC Apply 1 application topically daily.      Marland Kitchen UNABLE TO FIND Rx: L8015-Post Mastectomy Camisole (Quantity: 2) L8000- Post Surgical Bras (Quantity: 6) L8020- Non-Silicone Breast Prosthesis (Quantity: 2) M5784- Silicone Breast Prosthesis (Quantity: 2) Dx: 174.9; Bilateral mastectomy  1 each  0  . valACYclovir (VALTREX) 500 MG tablet Take 1 tablet (500 mg total) by mouth daily.  30 tablet  5  . ALPRAZolam (XANAX) 0.25 MG tablet Take 1 tablet (0.25 mg total) by mouth 3 (three) times daily as needed for anxiety.  30 tablet  3  . LORazepam (ATIVAN) 0.5 MG tablet Take 1 tablet (0.5 mg total) by mouth every 8 (eight) hours as needed for anxiety.  30 tablet  0  . metoprolol tartrate (LOPRESSOR) 25 MG tablet Take 0.5 tablets (12.5 mg total) by mouth 2 (two) times daily.  60 tablet  0  . Multiple Vitamin (MULTIVITAMIN) tablet Take 1 tablet by mouth daily.      . pantoprazole (PROTONIX) 40 MG tablet Take 1 tablet (40 mg total) by mouth daily.  30  tablet  3   No current facility-administered medications for this encounter.     ALLERGIES: Penicillins   LABORATORY DATA:  Lab Results  Component Value Date   WBC 5.4 11/08/2012   HGB 11.0* 11/08/2012   HCT 32.8* 11/08/2012   MCV 96.7 11/08/2012   PLT 374 11/08/2012   Lab Results  Component Value Date   NA 141 11/08/2012   K 4.0 11/08/2012   CL 107 11/03/2012   CO2 20* 11/08/2012   Lab Results  Component Value Date   ALT 23 11/08/2012   AST 22 11/08/2012   ALKPHOS 44 11/08/2012   BILITOT 0.24 11/08/2012     NARRATIVE: Cheryl Moore was seen today for weekly treatment management. The chart was checked and the patient's films were reviewed.  The patient is doing well with treatment. No major complaints. No real skin irritation at this time.  PHYSICAL EXAMINATION: height is 5\' 2"  (1.575 m) and weight is 225 lb 3.2 oz (102.15 kg). Her temperature is 97.4 F (36.3 C). Her blood pressure is 133/82 and her pulse is 97.      the patient's skin shows some emerging erythema/hyperpigmentation. No desquamation.  ASSESSMENT: The patient is doing satisfactorily with treatment.  PLAN: We will continue with the patient's radiation  treatment as planned.

## 2012-12-20 ENCOUNTER — Ambulatory Visit
Admission: RE | Admit: 2012-12-20 | Discharge: 2012-12-20 | Disposition: A | Discharge status: Home / Self Care | Payer: BC Managed Care – PPO | Source: Ambulatory Visit | Attending: Radiation Oncology | Admitting: Radiation Oncology

## 2012-12-21 ENCOUNTER — Ambulatory Visit
Admission: RE | Admit: 2012-12-21 | Discharge: 2012-12-21 | Disposition: A | Discharge status: Home / Self Care | Payer: BC Managed Care – PPO | Source: Ambulatory Visit | Attending: Radiation Oncology | Admitting: Radiation Oncology

## 2012-12-26 ENCOUNTER — Telehealth: Payer: Self-pay | Admitting: Oncology

## 2012-12-26 ENCOUNTER — Other Ambulatory Visit: Payer: Self-pay | Admitting: Oncology

## 2012-12-26 ENCOUNTER — Ambulatory Visit
Admission: RE | Admit: 2012-12-26 | Discharge: 2012-12-26 | Disposition: A | Discharge status: Home / Self Care | Payer: BC Managed Care – PPO | Source: Ambulatory Visit | Attending: Radiation Oncology | Admitting: Radiation Oncology

## 2012-12-26 ENCOUNTER — Other Ambulatory Visit: Payer: Self-pay | Admitting: Adult Health

## 2012-12-26 ENCOUNTER — Encounter: Payer: Self-pay | Admitting: Radiation Oncology

## 2012-12-26 VITALS — BP 133/83 | HR 106 | Temp 98.0°F | Resp 20

## 2012-12-26 DIAGNOSIS — C50411 Malignant neoplasm of upper-outer quadrant of right female breast: Secondary | ICD-10-CM

## 2012-12-26 NOTE — Progress Notes (Addendum)
Pt in nursing after radiation treatment today due to scar area of right chest wall opening over past several days,. Pt states "it drained quite a lot when it first opened up". She feels this occurred on 12/21/12. She states it has opened more since that day. The open area is on her upper scar of right chest wall, approximately 3-4 cm long and 1-1.5 cm wide at widest area. There is scant amount of clear drainage, no odor noted today. Pt denies pain. Pt states she put call in to Dr Doreen Salvage office this morning.

## 2012-12-26 NOTE — Progress Notes (Signed)
Department of Radiation Oncology  Phone:  [phone] Fax:        [phone]  Weekly Treatment Note    Name: Cheryl Moore Date: 12/26/2012 MRN: [id number] DOB: Mar 15, 1960   Current dose: 36 Gy  Current fraction: 20   MEDICATIONS: Current Outpatient Prescriptions  Medication Sig Dispense Refill  . ALPRAZolam (XANAX) 0.25 MG tablet TAKE 1 TABLET BY MOUTH 3 TIMES DAILY AS NEEDED FOR ANXIETY  30 tablet  1  . Ascorbic Acid (VITAMIN C PO) Take 1 tablet by mouth daily.      Marland Kitchen CALCIUM PO Take 1 tablet by mouth daily.      . capecitabine (XELODA) 500 MG tablet Take 4 tablets (2,000 mg total) by mouth 2 (two) times daily after a meal.  250 tablet  4  . gabapentin (NEURONTIN) 100 MG capsule TAKE ONE CAPSULE 3 TIMES A DAY  90 capsule  0  . hyaluronate sodium (RADIAPLEXRX) GEL Apply 1 application topically 2 (two) times daily. Apply after rad tx and in am after showe/bath      . ibuprofen (ADVIL,MOTRIN) 200 MG tablet Take 200-400 mg by mouth every 8 (eight) hours as needed for pain.       Marland Kitchen LORazepam (ATIVAN) 0.5 MG tablet TAKE 1 TABLET BY MOUTH EVERY 8 HOURS  30 tablet  0  . metoprolol tartrate (LOPRESSOR) 25 MG tablet Take 0.5 tablets (12.5 mg total) by mouth 2 (two) times daily.  60 tablet  0  . Multiple Vitamin (MULTIVITAMIN) tablet Take 1 tablet by mouth daily.      . non-metallic deodorant Thornton Papas) MISC Apply 1 application topically daily.      . pantoprazole (PROTONIX) 40 MG tablet TAKE 1 TABLET (40 MG TOTAL) BY MOUTH DAILY.  30 tablet  3  . UNABLE TO FIND Rx: L8015-Post Mastectomy Camisole (Quantity: 2) L8000- Post Surgical Bras (Quantity: 6) L8020- Non-Silicone Breast Prosthesis (Quantity: 2) M5784- Silicone Breast Prosthesis (Quantity: 2) Dx: 174.9; Bilateral mastectomy  1 each  0  . valACYclovir (VALTREX) 500 MG tablet Take 1 tablet (500 mg total) by mouth daily.  30 tablet  5   No current facility-administered medications for this encounter.     ALLERGIES:  Penicillins   LABORATORY DATA:  Lab Results  Component Value Date   WBC 5.4 11/08/2012   HGB 11.0* 11/08/2012   HCT 32.8* 11/08/2012   MCV 96.7 11/08/2012   PLT 374 11/08/2012   Lab Results  Component Value Date   NA 141 11/08/2012   K 4.0 11/08/2012   CL 107 11/03/2012   CO2 20* 11/08/2012   Lab Results  Component Value Date   ALT 23 11/08/2012   AST 22 11/08/2012   ALKPHOS 44 11/08/2012   BILITOT 0.24 11/08/2012     NARRATIVE: Cheryl Moore was seen today for weekly treatment management. The chart was checked and the patient's films were reviewed. The patient has had an area open up along the vertical aspect of the mastectomy scar. This is several centimeters in length and is greater than 1 cm wide at the widest point. Otherwise the patient's skin looks good. She states that she feels that this broke open at night with movement of the arm.  PHYSICAL EXAMINATION: temperature is 98 F (36.7 C). Her blood pressure is 133/83 and her pulse is 106. Her respiration is 20.      as above in terms of an open area. No sign of infection. Overall the patient's skin looks quite good  at this point without any major areas of desquamation.  ASSESSMENT: The patient has been doing fine but does have an area which has opened up along the surgical incision. She already is in contact with Dr. Doreen Salvage office to set up an appointment to see him. I have also sent him a note through the Epic system regarding the details of her exam today.  PLAN: I told the patient that I would like to hold off on her treatment until she has been seen by the surgeon. I concern about wound healing and without you have's opinion on what can be done to help with this process.

## 2012-12-26 NOTE — Telephone Encounter (Signed)
, °

## 2012-12-27 ENCOUNTER — Ambulatory Visit: Payer: BC Managed Care – PPO

## 2012-12-27 ENCOUNTER — Ambulatory Visit (INDEPENDENT_AMBULATORY_CARE_PROVIDER_SITE_OTHER): Payer: BC Managed Care – PPO | Admitting: General Surgery

## 2012-12-27 ENCOUNTER — Encounter (INDEPENDENT_AMBULATORY_CARE_PROVIDER_SITE_OTHER): Payer: Self-pay | Admitting: General Surgery

## 2012-12-27 VITALS — BP 126/80 | HR 94 | Temp 97.0°F | Resp 18 | Ht 62.0 in | Wt 217.0 lb

## 2012-12-27 DIAGNOSIS — Z09 Encounter for follow-up examination after completed treatment for conditions other than malignant neoplasm: Secondary | ICD-10-CM

## 2012-12-27 DIAGNOSIS — C50919 Malignant neoplasm of unspecified site of unspecified female breast: Secondary | ICD-10-CM

## 2012-12-27 DIAGNOSIS — C50911 Malignant neoplasm of unspecified site of right female breast: Secondary | ICD-10-CM

## 2012-12-27 NOTE — Progress Notes (Addendum)
Subjective:     Patient ID: Cheryl Moore, female   DOB: [birth year], 52 y.o.   MRN: [id number]  HPI  52 yof who underwent primary chemo followed by bilateral mastectomies.  She did well initially and healed completely. She is now finished with 20 radiations sessions and last week while she was sleeping she reached out and then had an area on the right side at the upper aspect of her incision in her axilla pull apart. This drained some initially but is really not draining much now. She comes in today to have this evaluated. There no fevers associated with this. She otherwise is doing very well.   Review of Systems     Objective:   Physical Exam Right breast incision is healed, and the portion where removed excess axillary tissue at the superior aspect of that incision has come up more superficially. This is about a 4 x 1.5 cm area with granulation tissue present. There are changes socio-irradiation on the right side.    Assessment:     Status post bilateral mastectomy Stage IV breast cancer Open wound of right mastectomy     Plan:    I will plan to treat this locally for now. She has gotten 20 radiation treatments which may make his hardder to heal. I will have ask Dr Kelly Splinter to see her to see if we could speed this up or be treated in a different way. I will plan on seeing her back in one week.    I would hold on radiation for the time being.

## 2012-12-28 ENCOUNTER — Ambulatory Visit: Payer: BC Managed Care – PPO

## 2012-12-29 ENCOUNTER — Ambulatory Visit: Payer: BC Managed Care – PPO

## 2012-12-30 ENCOUNTER — Encounter (INDEPENDENT_AMBULATORY_CARE_PROVIDER_SITE_OTHER): Payer: BC Managed Care – PPO | Admitting: General Surgery

## 2012-12-30 ENCOUNTER — Ambulatory Visit: Payer: BC Managed Care – PPO

## 2012-12-30 DIAGNOSIS — C50919 Malignant neoplasm of unspecified site of unspecified female breast: Secondary | ICD-10-CM | POA: Insufficient documentation

## 2013-01-02 ENCOUNTER — Ambulatory Visit: Payer: BC Managed Care – PPO | Admitting: Radiation Oncology

## 2013-01-02 ENCOUNTER — Ambulatory Visit: Payer: BC Managed Care – PPO

## 2013-01-02 ENCOUNTER — Ambulatory Visit: Admission: RE | Admit: 2013-01-02 | Payer: BC Managed Care – PPO | Source: Ambulatory Visit

## 2013-01-03 ENCOUNTER — Ambulatory Visit: Payer: BC Managed Care – PPO

## 2013-01-04 ENCOUNTER — Ambulatory Visit: Payer: BC Managed Care – PPO

## 2013-01-05 ENCOUNTER — Ambulatory Visit (HOSPITAL_BASED_OUTPATIENT_CLINIC_OR_DEPARTMENT_OTHER): Payer: BC Managed Care – PPO

## 2013-01-05 ENCOUNTER — Telehealth (INDEPENDENT_AMBULATORY_CARE_PROVIDER_SITE_OTHER): Payer: Self-pay

## 2013-01-05 ENCOUNTER — Ambulatory Visit: Payer: BC Managed Care – PPO | Admitting: Radiation Oncology

## 2013-01-05 ENCOUNTER — Ambulatory Visit: Payer: BC Managed Care – PPO

## 2013-01-05 ENCOUNTER — Encounter: Payer: Self-pay | Admitting: Oncology

## 2013-01-05 ENCOUNTER — Ambulatory Visit (HOSPITAL_BASED_OUTPATIENT_CLINIC_OR_DEPARTMENT_OTHER): Payer: BC Managed Care – PPO | Admitting: Oncology

## 2013-01-05 ENCOUNTER — Other Ambulatory Visit: Payer: Self-pay | Admitting: Emergency Medicine

## 2013-01-05 VITALS — BP 113/76 | HR 103 | Temp 97.7°F | Resp 18 | Ht 62.0 in | Wt 216.3 lb

## 2013-01-05 DIAGNOSIS — G4700 Insomnia, unspecified: Secondary | ICD-10-CM

## 2013-01-05 DIAGNOSIS — C7951 Secondary malignant neoplasm of bone: Secondary | ICD-10-CM

## 2013-01-05 DIAGNOSIS — C50219 Malignant neoplasm of upper-inner quadrant of unspecified female breast: Secondary | ICD-10-CM

## 2013-01-05 DIAGNOSIS — G589 Mononeuropathy, unspecified: Secondary | ICD-10-CM

## 2013-01-05 DIAGNOSIS — C50419 Malignant neoplasm of upper-outer quadrant of unspecified female breast: Secondary | ICD-10-CM

## 2013-01-05 DIAGNOSIS — Z5111 Encounter for antineoplastic chemotherapy: Secondary | ICD-10-CM

## 2013-01-05 DIAGNOSIS — C50411 Malignant neoplasm of upper-outer quadrant of right female breast: Secondary | ICD-10-CM

## 2013-01-05 DIAGNOSIS — Z23 Encounter for immunization: Secondary | ICD-10-CM

## 2013-01-05 DIAGNOSIS — C773 Secondary and unspecified malignant neoplasm of axilla and upper limb lymph nodes: Secondary | ICD-10-CM

## 2013-01-05 DIAGNOSIS — R000 Tachycardia, unspecified: Secondary | ICD-10-CM

## 2013-01-05 MED ORDER — GOSERELIN ACETATE 3.6 MG ~~LOC~~ IMPL
3.6000 mg | DRUG_IMPLANT | SUBCUTANEOUS | Status: DC
  Administered 2013-01-05: 3.6 mg via SUBCUTANEOUS
  Filled 2013-01-05: qty 3.6

## 2013-01-05 MED ORDER — SODIUM CHLORIDE 0.9 % IJ SOLN
10.0000 mL | INTRAMUSCULAR | Status: DC | PRN
  Administered 2013-01-05: 10 mL via INTRAVENOUS
  Filled 2013-01-05: qty 10

## 2013-01-05 MED ORDER — HEPARIN SOD (PORK) LOCK FLUSH 100 UNIT/ML IV SOLN
500.0000 [IU] | Freq: Once | INTRAVENOUS | Status: AC
  Administered 2013-01-05: 500 [IU] via INTRAVENOUS
  Filled 2013-01-05: qty 5

## 2013-01-05 MED ORDER — DENOSUMAB 120 MG/1.7ML ~~LOC~~ SOLN
120.0000 mg | Freq: Once | SUBCUTANEOUS | Status: AC
  Administered 2013-01-05: 120 mg via SUBCUTANEOUS
  Filled 2013-01-05: qty 1.7

## 2013-01-05 MED ORDER — INFLUENZA VAC SPLIT QUAD 0.5 ML IM SUSP
0.5000 mL | Freq: Once | INTRAMUSCULAR | Status: AC
  Administered 2013-01-05: 0.5 mL via INTRAMUSCULAR
  Filled 2013-01-05: qty 0.5
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
## 2013-01-05 NOTE — Telephone Encounter (Signed)
appts made and printed...td 

## 2013-01-05 NOTE — Telephone Encounter (Signed)
LMOM advising pt that I was canceling her appt with Dr Dwain Sarna for 12/12 since she just saw Dr Kelly Splinter about her wound per Dr Dwain Sarna. I r/s her appt to 12/29 arrive at 4:00.

## 2013-01-06 ENCOUNTER — Ambulatory Visit: Payer: BC Managed Care – PPO

## 2013-01-06 ENCOUNTER — Encounter (INDEPENDENT_AMBULATORY_CARE_PROVIDER_SITE_OTHER): Payer: BC Managed Care – PPO | Admitting: General Surgery

## 2013-01-09 ENCOUNTER — Ambulatory Visit: Payer: BC Managed Care – PPO

## 2013-01-09 ENCOUNTER — Ambulatory Visit
Admission: RE | Admit: 2013-01-09 | Discharge: 2013-01-09 | Disposition: A | Discharge status: Home / Self Care | Payer: BC Managed Care – PPO | Source: Ambulatory Visit | Attending: Radiation Oncology | Admitting: Radiation Oncology

## 2013-01-09 ENCOUNTER — Ambulatory Visit: Admission: RE | Admit: 2013-01-09 | Payer: BC Managed Care – PPO | Source: Ambulatory Visit

## 2013-01-09 DIAGNOSIS — C50411 Malignant neoplasm of upper-outer quadrant of right female breast: Secondary | ICD-10-CM

## 2013-01-09 NOTE — Progress Notes (Signed)
Radiation Oncology         [phone] ________________________________  Name: Cheryl Moore MRN: [id number]  Date: 01/09/2013  DOB: [birth year]  Follow-Up Visit Note   Narrative:  The patient returns today for  follow-up.  The patient indicates that she is being seen by Dr. Dwain Sarna on 01/23/2013. The patient still is healing in terms of the area which opened. Chief to be seen today to review her status and make sure there is nothing else that we should be doing at this time. She also was seen by plastic surgery who felt that this area was healing well on its own.                              ALLERGIES:  is allergic to penicillins.  Meds: Current Outpatient Prescriptions  Medication Sig Dispense Refill  . ALPRAZolam (XANAX) 0.25 MG tablet TAKE 1 TABLET BY MOUTH 3 TIMES DAILY AS NEEDED FOR ANXIETY  30 tablet  1  . Ascorbic Acid (VITAMIN C PO) Take 1 tablet by mouth daily.      Marland Kitchen CALCIUM PO Take 1 tablet by mouth daily.      . capecitabine (XELODA) 500 MG tablet Take 4 tablets (2,000 mg total) by mouth 2 (two) times daily after a meal.  250 tablet  4  . gabapentin (NEURONTIN) 100 MG capsule TAKE ONE CAPSULE 3 TIMES A DAY  90 capsule  0  . hyaluronate sodium (RADIAPLEXRX) GEL Apply 1 application topically 2 (two) times daily. Apply after rad tx and in am after showe/bath      . ibuprofen (ADVIL,MOTRIN) 200 MG tablet Take 200-400 mg by mouth every 8 (eight) hours as needed for pain.       Marland Kitchen LORazepam (ATIVAN) 0.5 MG tablet TAKE 1 TABLET BY MOUTH EVERY 8 HOURS  30 tablet  0  . metoprolol tartrate (LOPRESSOR) 25 MG tablet Take 0.5 tablets (12.5 mg total) by mouth 2 (two) times daily.  60 tablet  0  . Multiple Vitamin (MULTIVITAMIN) tablet Take 1 tablet by mouth daily.      . non-metallic deodorant Thornton Papas) MISC Apply 1 application topically daily.      . pantoprazole (PROTONIX) 40 MG tablet TAKE 1 TABLET (40 MG TOTAL) BY MOUTH DAILY.  30 tablet  3  . UNABLE TO FIND Rx: L8015-Post Mastectomy  Camisole (Quantity: 2) L8000- Post Surgical Bras (Quantity: 6) L8020- Non-Silicone Breast Prosthesis (Quantity: 2) N8295- Silicone Breast Prosthesis (Quantity: 2) Dx: 174.9; Bilateral mastectomy  1 each  0  . valACYclovir (VALTREX) 500 MG tablet Take 1 tablet (500 mg total) by mouth daily.  30 tablet  5   No current facility-administered medications for this encounter.    Physical Findings: The patient is in no acute distress. Patient is alert and oriented.  vitals were not taken for this visit..   The superior aspect of the mastectomy scar has healed significantly but there still is an area which remains open. It looks to me like this may take another 2 weeks to heal and therefore I believe that her schedule with being seen again should be fine.  Lab Findings: Lab Results  Component Value Date   WBC 5.4 11/08/2012   HGB 11.0* 11/08/2012   HCT 32.8* 11/08/2012   MCV 96.7 11/08/2012   PLT 374 11/08/2012     Radiographic Findings: No results found.  Impression:    The patient and I discussed the  healing process and where she stands. She will be seen on 12/29 by Dr. Dwain Sarna and I believe this is a reasonable time frame. She will let us know if she feels that she has healed substantially before then such that she needs to be seen sooner.   Plan:  I. will await her visit and then we will resume the patient's treatment as soon as possible. She understands the potential problem of starting radiation treatment to send which really could cause substantial difficulties in terms of ongoing healing issues.   Radene Gunning, M.D., Ph.D.

## 2013-01-10 ENCOUNTER — Ambulatory Visit: Payer: BC Managed Care – PPO

## 2013-01-11 ENCOUNTER — Ambulatory Visit: Payer: BC Managed Care – PPO

## 2013-01-12 ENCOUNTER — Ambulatory Visit: Payer: BC Managed Care – PPO

## 2013-01-13 ENCOUNTER — Ambulatory Visit: Payer: BC Managed Care – PPO

## 2013-01-13 ENCOUNTER — Ambulatory Visit: Payer: BC Managed Care – PPO | Admitting: Radiation Oncology

## 2013-01-16 ENCOUNTER — Ambulatory Visit: Admission: RE | Admit: 2013-01-16 | Payer: BC Managed Care – PPO | Source: Ambulatory Visit

## 2013-01-16 ENCOUNTER — Ambulatory Visit: Payer: BC Managed Care – PPO

## 2013-01-16 ENCOUNTER — Encounter (HOSPITAL_BASED_OUTPATIENT_CLINIC_OR_DEPARTMENT_OTHER): Payer: BC Managed Care – PPO | Attending: General Surgery

## 2013-01-16 DIAGNOSIS — T66XXXA Radiation sickness, unspecified, initial encounter: Secondary | ICD-10-CM | POA: Insufficient documentation

## 2013-01-16 DIAGNOSIS — C50919 Malignant neoplasm of unspecified site of unspecified female breast: Secondary | ICD-10-CM | POA: Insufficient documentation

## 2013-01-16 DIAGNOSIS — Y842 Radiological procedure and radiotherapy as the cause of abnormal reaction of the patient, or of later complication, without mention of misadventure at the time of the procedure: Secondary | ICD-10-CM | POA: Insufficient documentation

## 2013-01-16 DIAGNOSIS — Z79899 Other long term (current) drug therapy: Secondary | ICD-10-CM | POA: Insufficient documentation

## 2013-01-16 DIAGNOSIS — L98499 Non-pressure chronic ulcer of skin of other sites with unspecified severity: Secondary | ICD-10-CM | POA: Insufficient documentation

## 2013-01-16 NOTE — Progress Notes (Signed)
[facility]  NAME:  Cheryl Moore, [place]PORT               ACCOUNT NO.:  [id number]  MEDICAL RECORD NO.:  [id number]      DATE OF BIRTH:  February 22, 1960  PHYSICIAN:  Wayland Denis, DO            VISIT DATE:                                  OFFICE VISIT   HISTORY OF PRESENT ILLNESS:  The patient is a 52 year old female who is here for evaluation and followup on a right chest wall breast ulcer secondary to surgery and radiation.  She underwent a right mastectomy for breast cancer and then was radiated.  The skin opened up, and she has a wound in that area.  She still needs radiation, but they do not want to continue until the wound is healed.  PAST MEDICAL HISTORY:  Positive for right breast cancer.  REVIEW OF SYSTEMS:  Otherwise negative.  She did have a severe reaction to the radiation which she is getting on the right breast and right neck area.  Her medications are listed in the chart and they were reviewed as well as her allergies.  PHYSICAL EXAMINATION:  On exam, she is alert, oriented, and cooperative. She is very pleasant.  She has a good understanding of her condition. Her pupils are equal.  Extraocular muscles are intact.  No cervical lymphadenopathy.  Her breathing is unlabored.  Her heart rate is regular.  Her abdomen is large, but soft.  The radiation damage has improved since last visit as well as the wound.  Her breathing is unlabored and her heart rate is regular.  Recommend collagen to the ulcerated area and we will check a pre- albumin.  Recommend multivitamin, vitamin C, zinc, and see her back in a week.     Wayland Denis, DO     CS/MEDQ  D:  01/16/2013  T:  01/16/2013  Job:  [id number]

## 2013-01-17 ENCOUNTER — Ambulatory Visit: Admission: RE | Admit: 2013-01-17 | Payer: BC Managed Care – PPO | Source: Ambulatory Visit

## 2013-01-17 ENCOUNTER — Ambulatory Visit: Payer: BC Managed Care – PPO

## 2013-01-18 ENCOUNTER — Ambulatory Visit: Payer: BC Managed Care – PPO

## 2013-01-18 ENCOUNTER — Ambulatory Visit: Admission: RE | Admit: 2013-01-18 | Payer: BC Managed Care – PPO | Source: Ambulatory Visit

## 2013-01-20 ENCOUNTER — Ambulatory Visit: Admission: RE | Admit: 2013-01-20 | Payer: BC Managed Care – PPO | Source: Ambulatory Visit

## 2013-01-20 ENCOUNTER — Ambulatory Visit: Payer: BC Managed Care – PPO

## 2013-01-23 ENCOUNTER — Encounter (INDEPENDENT_AMBULATORY_CARE_PROVIDER_SITE_OTHER): Payer: Self-pay | Admitting: General Surgery

## 2013-01-23 ENCOUNTER — Ambulatory Visit (INDEPENDENT_AMBULATORY_CARE_PROVIDER_SITE_OTHER): Payer: BC Managed Care – PPO | Admitting: General Surgery

## 2013-01-23 ENCOUNTER — Ambulatory Visit: Payer: BC Managed Care – PPO

## 2013-01-23 VITALS — BP 140/84 | HR 88 | Temp 98.1°F | Resp 14 | Ht 62.0 in | Wt 219.6 lb

## 2013-01-23 DIAGNOSIS — Z09 Encounter for follow-up examination after completed treatment for conditions other than malignant neoplasm: Secondary | ICD-10-CM

## 2013-01-24 ENCOUNTER — Ambulatory Visit: Payer: BC Managed Care – PPO

## 2013-01-24 NOTE — Progress Notes (Signed)
Subjective:     Patient ID: Cheryl Moore, female   DOB: 01-02-1961, 52 y.o.   MRN: [id number]  HPI 51 yof who underwent primary chemo followed by bilateral mastectomies. She did well initially and healed completely. She then finished with 20 radiation sessions and while she was sleeping she reached out and then had an area on the right side at the upper aspect of her incision in her axilla pull apart. This drained some initially but is really not draining much now. There no fevers associated with this. She otherwise is doing very well. This is healing well.  She has seen Dr Kelly Splinter and is being treated.   Review of Systems     Objective:   Physical Exam Wound nearly healed with small lateral superficial opening    Assessment:     S/p right mastectomy with open wound during radiation     Plan:     I think this will heal soon.  See back in 2 weeks. Appreciate Dr Kelly Splinter seeing her.

## 2013-01-24 NOTE — Progress Notes (Signed)
[facility]  NAME:  STEPHANY, POORMAN               ACCOUNT NO.:  [id number]  MEDICAL RECORD NO.:  [id number]      DATE OF BIRTH:  16-Dec-1960  PHYSICIAN:  Wayland Denis, DO       VISIT DATE:  01/23/2013                                  OFFICE VISIT   The patient is a 52 year old female who is here for followup on her right lateral chest ulcer secondary to radiation damage.  She has been using the collagen with some improvement, it seems to be less deep and getting a little bit smaller.  The overall skin area looks much better. Overall, she is doing much, much better.  Her pre-albumin came back in the 24 range, so this is encouraging as well.  She states she feels a lot better now that she has been doing the protein shakes as well. There has been no change in her medications or social history.  REVIEW OF SYSTEMS:  Otherwise negative.  PHYSICAL EXAMINATION:  On exam, she is alert, oriented, cooperative, not in any acute distress.  She is very pleasant.  Pupils are equal. Extraocular muscles are intact.  Breathing is nonlabored and her heart rate is regular.  The wound is described above.  We will continue with the collagen, protein, multivitamin, vitamin C.  We have to hold off on HBO because she has not finished with the radiation or the chemo, but we will evaluate her wound as we move along and when her treatment finishes.     Wayland Denis, DO     CS/MEDQ  D:  01/23/2013  T:  01/23/2013  Job:  [id number]

## 2013-01-25 ENCOUNTER — Ambulatory Visit: Payer: BC Managed Care – PPO

## 2013-01-27 ENCOUNTER — Ambulatory Visit: Payer: BC Managed Care – PPO | Admitting: Radiation Oncology

## 2013-01-27 ENCOUNTER — Ambulatory Visit: Payer: BC Managed Care – PPO

## 2013-01-30 ENCOUNTER — Ambulatory Visit: Payer: BC Managed Care – PPO

## 2013-01-30 ENCOUNTER — Encounter (HOSPITAL_BASED_OUTPATIENT_CLINIC_OR_DEPARTMENT_OTHER): Payer: BC Managed Care – PPO | Attending: Plastic Surgery

## 2013-01-30 DIAGNOSIS — N61 Mastitis without abscess: Secondary | ICD-10-CM | POA: Insufficient documentation

## 2013-01-30 DIAGNOSIS — Y842 Radiological procedure and radiotherapy as the cause of abnormal reaction of the patient, or of later complication, without mention of misadventure at the time of the procedure: Secondary | ICD-10-CM | POA: Insufficient documentation

## 2013-01-31 ENCOUNTER — Ambulatory Visit: Payer: BC Managed Care – PPO

## 2013-01-31 NOTE — Progress Notes (Signed)
[facility]  NAME:  MONZERAT, HANDLER               ACCOUNT NO.:  [id number]  MEDICAL RECORD NO.:  [id number]      DATE OF BIRTH:  [birth year]  PHYSICIAN:  Theodoro Kos, DO       VISIT DATE:  01/30/2013                                  OFFICE VISIT   HISTORY OF PRESENT ILLNESS:  The patient is a 53 year old female who is here for followup on her right breast ulcer.  She underwent mastectomy with radiation and had breakdown, has been using collagen on the area and she has marked improvement in the overall skin with decrease in the raw and red area.  The wound has healed.  REVIEW OF SYSTEMS:  Otherwise negative.  No change in medications or social history.  She is very pleased with the progress.  PHYSICAL EXAMINATION:  GENERAL:  She is alert, oriented, and cooperative, not in any acute distress. NECK:  No cervical lymphadenopathy. SKIN:  The skin is markedly improved. CHEST:  Her breathing is unlabored. HEART:  Her heart rate is regular.  PLAN:  We will continue with collagen for 1 more week and she does not need to come back unless she has breakdown with starting at the radiation again and I will see her in follow up in my office in about a month.  She is also encouraged to continue with the protein.     Theodoro Kos, DO     CS/MEDQ  D:  01/30/2013  T:  01/31/2013  Job:  [id number]

## 2013-02-01 ENCOUNTER — Ambulatory Visit: Payer: BC Managed Care – PPO

## 2013-02-02 ENCOUNTER — Ambulatory Visit: Payer: BC Managed Care – PPO

## 2013-02-03 ENCOUNTER — Ambulatory Visit: Payer: BC Managed Care – PPO

## 2013-02-06 ENCOUNTER — Ambulatory Visit: Payer: BC Managed Care – PPO

## 2013-02-06 ENCOUNTER — Ambulatory Visit (HOSPITAL_BASED_OUTPATIENT_CLINIC_OR_DEPARTMENT_OTHER): Payer: BC Managed Care – PPO

## 2013-02-06 ENCOUNTER — Other Ambulatory Visit: Payer: Self-pay

## 2013-02-06 ENCOUNTER — Other Ambulatory Visit (HOSPITAL_BASED_OUTPATIENT_CLINIC_OR_DEPARTMENT_OTHER): Payer: BC Managed Care – PPO

## 2013-02-06 ENCOUNTER — Ambulatory Visit (HOSPITAL_BASED_OUTPATIENT_CLINIC_OR_DEPARTMENT_OTHER): Payer: BC Managed Care – PPO | Admitting: Adult Health

## 2013-02-06 ENCOUNTER — Other Ambulatory Visit: Payer: BC Managed Care – PPO

## 2013-02-06 ENCOUNTER — Encounter: Payer: Self-pay | Admitting: Adult Health

## 2013-02-06 VITALS — BP 123/88 | HR 89 | Temp 97.9°F | Resp 18 | Ht 62.0 in | Wt 216.9 lb

## 2013-02-06 DIAGNOSIS — C7952 Secondary malignant neoplasm of bone marrow: Secondary | ICD-10-CM

## 2013-02-06 DIAGNOSIS — Z5111 Encounter for antineoplastic chemotherapy: Secondary | ICD-10-CM

## 2013-02-06 DIAGNOSIS — C50411 Malignant neoplasm of upper-outer quadrant of right female breast: Secondary | ICD-10-CM

## 2013-02-06 DIAGNOSIS — C50419 Malignant neoplasm of upper-outer quadrant of unspecified female breast: Secondary | ICD-10-CM

## 2013-02-06 DIAGNOSIS — Z9010 Acquired absence of unspecified breast and nipple: Secondary | ICD-10-CM

## 2013-02-06 DIAGNOSIS — C7951 Secondary malignant neoplasm of bone: Secondary | ICD-10-CM

## 2013-02-06 DIAGNOSIS — L270 Generalized skin eruption due to drugs and medicaments taken internally: Secondary | ICD-10-CM

## 2013-02-06 DIAGNOSIS — G609 Hereditary and idiopathic neuropathy, unspecified: Secondary | ICD-10-CM

## 2013-02-06 DIAGNOSIS — C50919 Malignant neoplasm of unspecified site of unspecified female breast: Secondary | ICD-10-CM

## 2013-02-06 LAB — COMPREHENSIVE METABOLIC PANEL (CC13)
ALBUMIN: 4.1 g/dL (ref 3.5–5.0)
ALK PHOS: 80 U/L (ref 40–150)
ALT: 28 U/L (ref 0–55)
ANION GAP: 15 meq/L — AB (ref 3–11)
AST: 25 U/L (ref 5–34)
BILIRUBIN TOTAL: 0.82 mg/dL (ref 0.20–1.20)
BUN: 15.4 mg/dL (ref 7.0–26.0)
CO2: 20 meq/L — AB (ref 22–29)
Calcium: 10.1 mg/dL (ref 8.4–10.4)
Chloride: 102 mEq/L (ref 98–109)
Creatinine: 1.1 mg/dL (ref 0.6–1.1)
Glucose: 103 mg/dl (ref 70–140)
Potassium: 4.1 mEq/L (ref 3.5–5.1)
SODIUM: 137 meq/L (ref 136–145)
Total Protein: 7.4 g/dL (ref 6.4–8.3)

## 2013-02-06 LAB — CBC WITH DIFFERENTIAL/PLATELET
BASO%: 1 % (ref 0.0–2.0)
Basophils Absolute: 0.1 10*3/uL (ref 0.0–0.1)
EOS%: 1.1 % (ref 0.0–7.0)
Eosinophils Absolute: 0.1 10*3/uL (ref 0.0–0.5)
HCT: 40.4 % (ref 34.8–46.6)
HGB: 13.9 g/dL (ref 11.6–15.9)
LYMPH#: 1.6 10*3/uL (ref 0.9–3.3)
LYMPH%: 24.2 % (ref 14.0–49.7)
MCH: 32.4 pg (ref 25.1–34.0)
MCHC: 34.5 g/dL (ref 31.5–36.0)
MCV: 93.7 fL (ref 79.5–101.0)
MONO#: 0.9 10*3/uL (ref 0.1–0.9)
MONO%: 12.7 % (ref 0.0–14.0)
NEUT%: 61 % (ref 38.4–76.8)
NEUTROS ABS: 4.1 10*3/uL (ref 1.5–6.5)
Platelets: 266 10*3/uL (ref 145–400)
RBC: 4.31 10*6/uL (ref 3.70–5.45)
RDW: 23.6 % — ABNORMAL HIGH (ref 11.2–14.5)
WBC: 6.8 10*3/uL (ref 3.9–10.3)

## 2013-02-06 MED ORDER — GOSERELIN ACETATE 3.6 MG ~~LOC~~ IMPL
3.6000 mg | DRUG_IMPLANT | SUBCUTANEOUS | Status: DC
  Administered 2013-02-06: 3.6 mg via SUBCUTANEOUS
  Filled 2013-02-06: qty 3.6

## 2013-02-06 MED ORDER — DENOSUMAB 120 MG/1.7ML ~~LOC~~ SOLN
120.0000 mg | Freq: Once | SUBCUTANEOUS | Status: AC
  Administered 2013-02-06: 120 mg via SUBCUTANEOUS
  Filled 2013-02-06: qty 1.7

## 2013-02-06 NOTE — Patient Instructions (Signed)
Denosumab injection What is this medicine? DENOSUMAB (den oh sue mab) slows bone breakdown. Prolia is used to treat osteoporosis in women after menopause and in men. Xgeva is used to prevent bone fractures and other bone problems caused by cancer bone metastases. Xgeva is also used to treat giant cell tumor of the bone. This medicine may be used for other purposes; ask your health care provider or pharmacist if you have questions. COMMON BRAND NAME(S): Prolia, XGEVA What should I tell my health care provider before I take this medicine? They need to know if you have any of these conditions: -dental disease -eczema -infection or history of infections -kidney disease or on dialysis -low blood calcium or vitamin D -malabsorption syndrome -scheduled to have surgery or tooth extraction -taking medicine that contains denosumab -thyroid or parathyroid disease -an unusual reaction to denosumab, other medicines, foods, dyes, or preservatives -pregnant or trying to get pregnant -breast-feeding How should I use this medicine? This medicine is for injection under the skin. It is given by a health care professional in a hospital or clinic setting. If you are getting Prolia, a special MedGuide will be given to you by the pharmacist with each prescription and refill. Be sure to read this information carefully each time. For Prolia, talk to your pediatrician regarding the use of this medicine in children. Special care may be needed. For Xgeva, talk to your pediatrician regarding the use of this medicine in children. While this drug may be prescribed for children as young as 13 years for selected conditions, precautions do apply. Overdosage: If you think you've taken too much of this medicine contact a poison control center or emergency room at once. Overdosage: If you think you have taken too much of this medicine contact a poison control center or emergency room at once. NOTE: This medicine is only for  you. Do not share this medicine with others. What if I miss a dose? It is important not to miss your dose. Call your doctor or health care professional if you are unable to keep an appointment. What may interact with this medicine? Do not take this medicine with any of the following medications: -other medicines containing denosumab This medicine may also interact with the following medications: -medicines that suppress the immune system -medicines that treat cancer -steroid medicines like prednisone or cortisone This list may not describe all possible interactions. Give your health care provider a list of all the medicines, herbs, non-prescription drugs, or dietary supplements you use. Also tell them if you smoke, drink alcohol, or use illegal drugs. Some items may interact with your medicine. What should I watch for while using this medicine? Visit your doctor or health care professional for regular checks on your progress. Your doctor or health care professional may order blood tests and other tests to see how you are doing. Call your doctor or health care professional if you get a cold or other infection while receiving this medicine. Do not treat yourself. This medicine may decrease your body's ability to fight infection. You should make sure you get enough calcium and vitamin D while you are taking this medicine, unless your doctor tells you not to. Discuss the foods you eat and the vitamins you take with your health care professional. See your dentist regularly. Brush and floss your teeth as directed. Before you have any dental work done, tell your dentist you are receiving this medicine. Do not become pregnant while taking this medicine or for 5 months after stopping   it. Women should inform their doctor if they wish to become pregnant or think they might be pregnant. There is a potential for serious side effects to an unborn child. Talk to your health care professional or pharmacist for more  information. What side effects may I notice from receiving this medicine? Side effects that you should report to your doctor or health care professional as soon as possible: -allergic reactions like skin rash, itching or hives, swelling of the face, lips, or tongue -breathing problems -chest pain -fast, irregular heartbeat -feeling faint or lightheaded, falls -fever, chills, or any other sign of infection -muscle spasms, tightening, or twitches -numbness or tingling -skin blisters or bumps, or is dry, peels, or red -slow healing or unexplained pain in the mouth or jaw -unusual bleeding or bruising Side effects that usually do not require medical attention (Report these to your doctor or health care professional if they continue or are bothersome.): -muscle pain -stomach upset, gas This list may not describe all possible side effects. Call your doctor for medical advice about side effects. You may report side effects to FDA at 1-800-FDA-1088. Where should I keep my medicine? This medicine is only given in a clinic, doctor's office, or other health care setting and will not be stored at home. NOTE: This sheet is a summary. It may not cover all possible information. If you have questions about this medicine, talk to your doctor, pharmacist, or health care provider.  2014, Elsevier/Gold Standard. (2011-07-13 12:37:47)  Goserelin injection What is this medicine? GOSERELIN (GOE se rel in) is similar to a hormone found in the body. It lowers the amount of sex hormones that the body makes. Men will have lower testosterone levels and women will have lower estrogen levels while taking this medicine. In men, this medicine is used to treat prostate cancer; the injection is either given once per month or once every 12 weeks. A once per month injection (only) is used to treat women with endometriosis, dysfunctional uterine bleeding, or advanced breast cancer. This medicine may be used for other  purposes; ask your health care provider or pharmacist if you have questions. COMMON BRAND NAME(S): Zoladex What should I tell my health care provider before I take this medicine? They need to know if you have any of these conditions (some only apply to women): -diabetes -heart disease or previous heart attack -high blood pressure -high cholesterol -kidney disease -osteoporosis or low bone density -problems passing urine -spinal cord injury -stroke -tobacco smoker -an unusual or allergic reaction to goserelin, hormone therapy, other medicines, foods, dyes, or preservatives -pregnant or trying to get pregnant -breast-feeding How should I use this medicine? This medicine is for injection under the skin. It is given by a health care professional in a hospital or clinic setting. Men receive this injection once every 4 weeks or once every 12 weeks. Women will only receive the once every 4 weeks injection. Talk to your pediatrician regarding the use of this medicine in children. Special care may be needed. Overdosage: If you think you have taken too much of this medicine contact a poison control center or emergency room at once. NOTE: This medicine is only for you. Do not share this medicine with others. What if I miss a dose? It is important not to miss your dose. Call your doctor or health care professional if you are unable to keep an appointment. What may interact with this medicine? -female hormones like estrogen -herbal or dietary supplements like black cohosh,   chasteberry, or DHEA -female hormones like testosterone -prasterone This list may not describe all possible interactions. Give your health care provider a list of all the medicines, herbs, non-prescription drugs, or dietary supplements you use. Also tell them if you smoke, drink alcohol, or use illegal drugs. Some items may interact with your medicine. What should I watch for while using this medicine? Visit your doctor or health  care professional for regular checks on your progress. Your symptoms may appear to get worse during the first weeks of this therapy. Tell your doctor or healthcare professional if your symptoms do not start to get better or if they get worse after this time. Your bones may get weaker if you take this medicine for a long time. If you smoke or frequently drink alcohol you may increase your risk of bone loss. A family history of osteoporosis, chronic use of drugs for seizures (convulsions), or corticosteroids can also increase your risk of bone loss. Talk to your doctor about how to keep your bones strong. This medicine should stop regular monthly menstration in women. Tell your doctor if you continue to menstrate. Women should not become pregnant while taking this medicine or for 12 weeks after stopping this medicine. Women should inform their doctor if they wish to become pregnant or think they might be pregnant. There is a potential for serious side effects to an unborn child. Talk to your health care professional or pharmacist for more information. Do not breast-feed an infant while taking this medicine. Men should inform their doctors if they wish to father a child. This medicine may lower sperm counts. Talk to your health care professional or pharmacist for more information. What side effects may I notice from receiving this medicine? Side effects that you should report to your doctor or health care professional as soon as possible: -allergic reactions like skin rash, itching or hives, swelling of the face, lips, or tongue -bone pain -breathing problems -changes in vision -chest pain -feeling faint or lightheaded, falls -fever, chills -pain, swelling, warmth in the leg -pain, tingling, numbness in the hands or feet -swelling of the ankles, feet, hands -trouble passing urine or change in the amount of urine -unusually high or low blood pressure -unusually weak or tired Side effects that usually  do not require medical attention (report to your doctor or health care professional if they continue or are bothersome): -change in sex drive or performance -changes in breast size in both males and females -changes in emotions or moods -headache -hot flashes -irritation at site where injected -loss of appetite -skin problems like acne, dry skin -vaginal dryness This list may not describe all possible side effects. Call your doctor for medical advice about side effects. You may report side effects to FDA at 1-800-FDA-1088. Where should I keep my medicine? This drug is given in a hospital or clinic and will not be stored at home. NOTE: This sheet is a summary. It may not cover all possible information. If you have questions about this medicine, talk to your doctor, pharmacist, or health care provider.  2014, Elsevier/Gold Standard. (2008-05-29 13:28:29)   
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
## 2013-02-07 ENCOUNTER — Ambulatory Visit: Payer: BC Managed Care – PPO

## 2013-02-08 ENCOUNTER — Ambulatory Visit: Payer: BC Managed Care – PPO

## 2013-02-08 ENCOUNTER — Telehealth: Payer: Self-pay | Admitting: Oncology

## 2013-02-09 ENCOUNTER — Encounter (INDEPENDENT_AMBULATORY_CARE_PROVIDER_SITE_OTHER): Payer: Self-pay | Admitting: General Surgery

## 2013-02-09 ENCOUNTER — Ambulatory Visit (INDEPENDENT_AMBULATORY_CARE_PROVIDER_SITE_OTHER): Payer: BC Managed Care – PPO | Admitting: General Surgery

## 2013-02-09 ENCOUNTER — Ambulatory Visit: Payer: BC Managed Care – PPO

## 2013-02-09 ENCOUNTER — Ambulatory Visit
Admission: RE | Admit: 2013-02-09 | Discharge: 2013-02-09 | Disposition: A | Discharge status: Home / Self Care | Payer: BC Managed Care – PPO | Source: Ambulatory Visit | Attending: Radiation Oncology | Admitting: Radiation Oncology

## 2013-02-09 VITALS — BP 126/76 | HR 88 | Temp 98.0°F | Resp 18 | Ht 63.0 in | Wt 223.0 lb

## 2013-02-09 DIAGNOSIS — Z09 Encounter for follow-up examination after completed treatment for conditions other than malignant neoplasm: Secondary | ICD-10-CM

## 2013-02-09 DIAGNOSIS — C50419 Malignant neoplasm of upper-outer quadrant of unspecified female breast: Secondary | ICD-10-CM

## 2013-02-09 NOTE — Progress Notes (Signed)
Subjective:     Patient ID: Cheryl Moore, female   DOB: 10/11/1960, 53 y.o.   MRN: [id number]  HPI 33 yof who underwent primary chemo followed by bilateral mastectomies. She did well initially and healed completely. She finished with 20 radiations sessions and while she was sleeping she reached out and then had an area on the right side at the upper aspect of her incision in her axilla pull apart. This finally has healed with conservative treatment and she returns today for followup. She is due to begin radiation again.   Review of Systems     Objective:   Physical Exam Bilateral mastectomy incisions healed without infection    Assessment:     S/p bilateral mastectomies     Plan:     This has finally healed. She is due to begin radiation I think that that would be fine now. I will see her back in about 4 months.

## 2013-02-10 ENCOUNTER — Ambulatory Visit: Payer: BC Managed Care – PPO

## 2013-02-13 ENCOUNTER — Ambulatory Visit: Payer: BC Managed Care – PPO

## 2013-02-13 ENCOUNTER — Ambulatory Visit
Admission: RE | Admit: 2013-02-13 | Discharge: 2013-02-13 | Disposition: A | Discharge status: Home / Self Care | Payer: BC Managed Care – PPO | Source: Ambulatory Visit | Attending: Radiation Oncology | Admitting: Radiation Oncology

## 2013-02-13 DIAGNOSIS — C50419 Malignant neoplasm of upper-outer quadrant of unspecified female breast: Secondary | ICD-10-CM

## 2013-02-13 NOTE — Progress Notes (Signed)
  Radiation Oncology         [phone]) [phone] ________________________________  Name: Cheryl Moore MRN: [id number]  Date: 02/13/2013  DOB: 12-15-1960  Simulation Verification Note   NARRATIVE: The patient was brought to the treatment unit and placed in the planned treatment position - this represents the replan given the necessity of replanning her treatment to allow time for wound healing. The clinical setup was verified. Then port films were obtained and uploaded to the radiation oncology medical record software.  The treatment beams were carefully compared against the planned radiation fields. The position, location, and shape of the radiation fields was reviewed. The targeted volume of tissue appears to be appropriately covered by the radiation beams. Based on my personal review, I approved the simulation verification. The patient's treatment will proceed as planned.  ________________________________   Jodelle Gross, MD, PhD

## 2013-02-14 ENCOUNTER — Ambulatory Visit
Admission: RE | Admit: 2013-02-14 | Discharge: 2013-02-14 | Disposition: A | Discharge status: Home / Self Care | Payer: BC Managed Care – PPO | Source: Ambulatory Visit | Attending: Radiation Oncology | Admitting: Radiation Oncology

## 2013-02-15 ENCOUNTER — Ambulatory Visit
Admission: RE | Admit: 2013-02-15 | Discharge: 2013-02-15 | Disposition: A | Discharge status: Home / Self Care | Payer: BC Managed Care – PPO | Source: Ambulatory Visit | Attending: Radiation Oncology | Admitting: Radiation Oncology

## 2013-02-15 DIAGNOSIS — Z510 Encounter for antineoplastic radiation therapy: Secondary | ICD-10-CM | POA: Insufficient documentation

## 2013-02-15 DIAGNOSIS — L539 Erythematous condition, unspecified: Secondary | ICD-10-CM | POA: Insufficient documentation

## 2013-02-15 DIAGNOSIS — F411 Generalized anxiety disorder: Secondary | ICD-10-CM | POA: Insufficient documentation

## 2013-02-15 DIAGNOSIS — R5383 Other fatigue: Secondary | ICD-10-CM

## 2013-02-15 DIAGNOSIS — Z79899 Other long term (current) drug therapy: Secondary | ICD-10-CM | POA: Insufficient documentation

## 2013-02-15 DIAGNOSIS — C50919 Malignant neoplasm of unspecified site of unspecified female breast: Secondary | ICD-10-CM | POA: Insufficient documentation

## 2013-02-15 DIAGNOSIS — R5381 Other malaise: Secondary | ICD-10-CM | POA: Insufficient documentation

## 2013-02-15 DIAGNOSIS — Z9010 Acquired absence of unspecified breast and nipple: Secondary | ICD-10-CM | POA: Insufficient documentation

## 2013-02-16 ENCOUNTER — Ambulatory Visit
Admission: RE | Admit: 2013-02-16 | Discharge: 2013-02-16 | Disposition: A | Discharge status: Home / Self Care | Payer: BC Managed Care – PPO | Source: Ambulatory Visit | Attending: Radiation Oncology | Admitting: Radiation Oncology

## 2013-02-17 ENCOUNTER — Ambulatory Visit: Payer: BC Managed Care – PPO | Admitting: Radiation Oncology

## 2013-02-17 ENCOUNTER — Encounter: Payer: Self-pay | Admitting: Radiation Oncology

## 2013-02-17 ENCOUNTER — Ambulatory Visit
Admission: RE | Admit: 2013-02-17 | Discharge: 2013-02-17 | Disposition: A | Discharge status: Home / Self Care | Payer: BC Managed Care – PPO | Source: Ambulatory Visit | Attending: Radiation Oncology | Admitting: Radiation Oncology

## 2013-02-17 VITALS — BP 98/66 | HR 87 | Temp 98.0°F | Resp 20 | Wt 218.5 lb

## 2013-02-17 DIAGNOSIS — C50419 Malignant neoplasm of upper-outer quadrant of unspecified female breast: Secondary | ICD-10-CM

## 2013-02-17 NOTE — Progress Notes (Signed)
Weekly rad txs right chest wall 24/33  Erythema ,skin intact,  Using radiaplex gel bid and vitamin e at night stated over scar area, no pain,  Appetite fair improving, good spirits 12:52 PM

## 2013-02-17 NOTE — Progress Notes (Signed)
Department of Radiation Oncology  Phone:  [phone] Fax:        [phone]  Weekly Treatment Note    Name: ARLAYNE LIGGINS Date: 02/17/2013 MRN: [id number] DOB: [birth year]     Current fraction: 25   MEDICATIONS: Current Outpatient Prescriptions  Medication Sig Dispense Refill  . ALPRAZolam (XANAX) 0.25 MG tablet TAKE 1 TABLET BY MOUTH 3 TIMES DAILY AS NEEDED FOR ANXIETY  30 tablet  1  . Ascorbic Acid (VITAMIN C PO) Take 1 tablet by mouth daily.      Marland Kitchen CALCIUM PO Take 1 tablet by mouth daily.      . capecitabine (XELODA) 500 MG tablet Take 4 tablets (2,000 mg total) by mouth 2 (two) times daily after a meal.  250 tablet  4  . gabapentin (NEURONTIN) 100 MG capsule TAKE ONE CAPSULE 3 TIMES A DAY  90 capsule  0  . hyaluronate sodium (RADIAPLEXRX) GEL Apply 1 application topically 2 (two) times daily. Apply after rad tx and in am after showe/bath      . ibuprofen (ADVIL,MOTRIN) 200 MG tablet Take 200-400 mg by mouth every 8 (eight) hours as needed for pain.       Marland Kitchen LORazepam (ATIVAN) 0.5 MG tablet TAKE 1 TABLET BY MOUTH EVERY 8 HOURS  30 tablet  0  . metoprolol tartrate (LOPRESSOR) 25 MG tablet Take 0.5 tablets (12.5 mg total) by mouth 2 (two) times daily.  60 tablet  0  . Multiple Vitamin (MULTIVITAMIN) tablet Take 1 tablet by mouth daily.      . non-metallic deodorant Jethro Poling) MISC Apply 1 application topically daily.      . pantoprazole (PROTONIX) 40 MG tablet TAKE 1 TABLET (40 MG TOTAL) BY MOUTH DAILY.  30 tablet  3  . prochlorperazine (COMPAZINE) 10 MG tablet       . UNABLE TO FIND Rx: L8015-Post Mastectomy Camisole (Quantity: 2) L8000- Post Surgical Bras (Quantity: 6) T4196- Non-Silicone Breast Prosthesis (Quantity: 2) Q2297- Silicone Breast Prosthesis (Quantity: 2) Dx: 174.9; Bilateral mastectomy  1 each  0  . valACYclovir (VALTREX) 500 MG tablet Take 1 tablet (500 mg total) by mouth daily.  30 tablet  5   No current facility-administered medications for this encounter.      ALLERGIES: Penicillins   LABORATORY DATA:  Lab Results  Component Value Date   WBC 6.8 02/06/2013   HGB 13.9 02/06/2013   HCT 40.4 02/06/2013   MCV 93.7 02/06/2013   PLT 266 02/06/2013   Lab Results  Component Value Date   NA 137 02/06/2013   K 4.1 02/06/2013   CL 107 11/03/2012   CO2 20* 02/06/2013   Lab Results  Component Value Date   ALT 28 02/06/2013   AST 25 02/06/2013   ALKPHOS 80 02/06/2013   BILITOT 0.82 02/06/2013     NARRATIVE: Cheryl Moore was seen today for weekly treatment management. The chart was checked and the patient's films were reviewed. The patient states she is doing well. Her skin has held up well. Appetite good. She is using skin cream daily.  PHYSICAL EXAMINATION: weight is 218 lb 8 oz (99.111 kg). Her oral temperature is 98 F (36.7 C). Her blood pressure is 98/66 and her pulse is 87. Her respiration is 20.      the skin is holding up well with some hyperpigmentation/erythema. The mastectomy scar looks good.  ASSESSMENT: The patient is doing satisfactorily with treatment.  PLAN: We will continue with the patient's radiation treatment as  planned.

## 2013-02-20 ENCOUNTER — Ambulatory Visit
Admission: RE | Admit: 2013-02-20 | Discharge: 2013-02-20 | Disposition: A | Discharge status: Home / Self Care | Payer: BC Managed Care – PPO | Source: Ambulatory Visit | Attending: Radiation Oncology | Admitting: Radiation Oncology

## 2013-02-21 ENCOUNTER — Ambulatory Visit
Admission: RE | Admit: 2013-02-21 | Discharge: 2013-02-21 | Disposition: A | Discharge status: Home / Self Care | Payer: BC Managed Care – PPO | Source: Ambulatory Visit | Attending: Radiation Oncology | Admitting: Radiation Oncology

## 2013-02-21 ENCOUNTER — Encounter: Payer: Self-pay | Admitting: Radiation Oncology

## 2013-02-21 ENCOUNTER — Other Ambulatory Visit (HOSPITAL_BASED_OUTPATIENT_CLINIC_OR_DEPARTMENT_OTHER): Payer: BC Managed Care – PPO

## 2013-02-21 ENCOUNTER — Telehealth: Payer: Self-pay | Admitting: Oncology

## 2013-02-21 ENCOUNTER — Ambulatory Visit: Payer: BC Managed Care – PPO | Admitting: Radiation Oncology

## 2013-02-21 ENCOUNTER — Encounter: Payer: Self-pay | Admitting: Adult Health

## 2013-02-21 ENCOUNTER — Ambulatory Visit (HOSPITAL_BASED_OUTPATIENT_CLINIC_OR_DEPARTMENT_OTHER): Payer: BC Managed Care – PPO | Admitting: Adult Health

## 2013-02-21 VITALS — BP 125/82 | HR 92 | Temp 97.4°F | Resp 18 | Ht 63.0 in | Wt 223.9 lb

## 2013-02-21 DIAGNOSIS — C7952 Secondary malignant neoplasm of bone marrow: Secondary | ICD-10-CM

## 2013-02-21 DIAGNOSIS — G4700 Insomnia, unspecified: Secondary | ICD-10-CM

## 2013-02-21 DIAGNOSIS — Z170 Estrogen receptor positive status [ER+]: Secondary | ICD-10-CM

## 2013-02-21 DIAGNOSIS — C7951 Secondary malignant neoplasm of bone: Secondary | ICD-10-CM

## 2013-02-21 DIAGNOSIS — C50419 Malignant neoplasm of upper-outer quadrant of unspecified female breast: Secondary | ICD-10-CM

## 2013-02-21 DIAGNOSIS — C50411 Malignant neoplasm of upper-outer quadrant of right female breast: Secondary | ICD-10-CM

## 2013-02-21 LAB — CBC WITH DIFFERENTIAL/PLATELET
BASO%: 0.9 % (ref 0.0–2.0)
Basophils Absolute: 0 10*3/uL (ref 0.0–0.1)
EOS%: 1.4 % (ref 0.0–7.0)
Eosinophils Absolute: 0.1 10*3/uL (ref 0.0–0.5)
HCT: 36.9 % (ref 34.8–46.6)
HGB: 13 g/dL (ref 11.6–15.9)
LYMPH%: 21.2 % (ref 14.0–49.7)
MCH: 33.9 pg (ref 25.1–34.0)
MCHC: 35.2 g/dL (ref 31.5–36.0)
MCV: 96.4 fL (ref 79.5–101.0)
MONO#: 0.4 10*3/uL (ref 0.1–0.9)
MONO%: 8.4 % (ref 0.0–14.0)
NEUT#: 3.6 10*3/uL (ref 1.5–6.5)
NEUT%: 68.1 % (ref 38.4–76.8)
PLATELETS: 233 10*3/uL (ref 145–400)
RBC: 3.83 10*6/uL (ref 3.70–5.45)
RDW: 24.6 % — ABNORMAL HIGH (ref 11.2–14.5)
WBC: 5.3 10*3/uL (ref 3.9–10.3)
lymph#: 1.1 10*3/uL (ref 0.9–3.3)

## 2013-02-21 LAB — COMPREHENSIVE METABOLIC PANEL (CC13)
ALT: 26 U/L (ref 0–55)
AST: 20 U/L (ref 5–34)
Albumin: 3.7 g/dL (ref 3.5–5.0)
Alkaline Phosphatase: 61 U/L (ref 40–150)
Anion Gap: 12 mEq/L — ABNORMAL HIGH (ref 3–11)
BILIRUBIN TOTAL: 0.57 mg/dL (ref 0.20–1.20)
BUN: 13.9 mg/dL (ref 7.0–26.0)
CO2: 21 mEq/L — ABNORMAL LOW (ref 22–29)
Calcium: 9.8 mg/dL (ref 8.4–10.4)
Chloride: 107 mEq/L (ref 98–109)
Creatinine: 0.8 mg/dL (ref 0.6–1.1)
Glucose: 138 mg/dl (ref 70–140)
Potassium: 3.9 mEq/L (ref 3.5–5.1)
SODIUM: 139 meq/L (ref 136–145)
TOTAL PROTEIN: 6.7 g/dL (ref 6.4–8.3)

## 2013-02-21 MED ORDER — LORAZEPAM 0.5 MG PO TABS: 0.5000 mg | ORAL_TABLET | Freq: Three times a day (TID) | ORAL | Status: DC | PRN

## 2013-02-21 NOTE — Telephone Encounter (Signed)
, °
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
## 2013-02-22 ENCOUNTER — Ambulatory Visit
Admission: RE | Admit: 2013-02-22 | Discharge: 2013-02-22 | Disposition: A | Discharge status: Home / Self Care | Payer: BC Managed Care – PPO | Source: Ambulatory Visit | Attending: Radiation Oncology | Admitting: Radiation Oncology

## 2013-02-22 ENCOUNTER — Ambulatory Visit: Payer: BC Managed Care – PPO | Admitting: Radiation Oncology

## 2013-02-23 ENCOUNTER — Ambulatory Visit
Admission: RE | Admit: 2013-02-23 | Discharge: 2013-02-23 | Disposition: A | Discharge status: Home / Self Care | Payer: BC Managed Care – PPO | Source: Ambulatory Visit | Attending: Radiation Oncology | Admitting: Radiation Oncology

## 2013-02-23 ENCOUNTER — Ambulatory Visit: Payer: BC Managed Care – PPO | Admitting: Radiation Oncology

## 2013-02-24 ENCOUNTER — Ambulatory Visit
Admission: RE | Admit: 2013-02-24 | Discharge: 2013-02-24 | Disposition: A | Discharge status: Home / Self Care | Payer: BC Managed Care – PPO | Source: Ambulatory Visit | Attending: Radiation Oncology | Admitting: Radiation Oncology

## 2013-02-24 ENCOUNTER — Ambulatory Visit: Payer: BC Managed Care – PPO | Admitting: Radiation Oncology

## 2013-02-24 DIAGNOSIS — C50419 Malignant neoplasm of upper-outer quadrant of unspecified female breast: Secondary | ICD-10-CM

## 2013-02-24 NOTE — Progress Notes (Signed)
Pt seen in treatment area by Dr Lisbeth Renshaw. No nurse eval performed.

## 2013-02-25 ENCOUNTER — Other Ambulatory Visit: Payer: Self-pay | Admitting: Adult Health

## 2013-02-27 ENCOUNTER — Ambulatory Visit
Admission: RE | Admit: 2013-02-27 | Discharge: 2013-02-27 | Disposition: A | Discharge status: Home / Self Care | Payer: BC Managed Care – PPO | Source: Ambulatory Visit | Attending: Radiation Oncology | Admitting: Radiation Oncology

## 2013-02-27 NOTE — Progress Notes (Signed)
   Department of Radiation Oncology  Phone:  [phone] Fax:        [phone]  Weekly Treatment Note    Name: Cheryl Moore Date: 02/27/2013 MRN: [id number] DOB: May 28, 1960   Current dose: 52.4 Gy  Current fraction:29   MEDICATIONS: Current Outpatient Prescriptions  Medication Sig Dispense Refill  . ALPRAZolam (XANAX) 0.25 MG tablet TAKE 1 TABLET BY MOUTH 3 TIMES DAILY AS NEEDED FOR ANXIETY  30 tablet  1  . Ascorbic Acid (VITAMIN C PO) Take 1 tablet by mouth daily.      Marland Kitchen CALCIUM PO Take 1 tablet by mouth daily.      . capecitabine (XELODA) 500 MG tablet Take 4 tablets (2,000 mg total) by mouth 2 (two) times daily after a meal.  250 tablet  4  . gabapentin (NEURONTIN) 100 MG capsule TAKE ONE CAPSULE 3 TIMES A DAY  90 capsule  0  . hyaluronate sodium (RADIAPLEXRX) GEL Apply 1 application topically 2 (two) times daily. Apply after rad tx and in am after showe/bath      . ibuprofen (ADVIL,MOTRIN) 200 MG tablet Take 200-400 mg by mouth every 8 (eight) hours as needed for pain.       Marland Kitchen LORazepam (ATIVAN) 0.5 MG tablet Take 1 tablet (0.5 mg total) by mouth every 8 (eight) hours as needed for anxiety.  30 tablet  0  . metoprolol tartrate (LOPRESSOR) 25 MG tablet Take 0.5 tablets (12.5 mg total) by mouth 2 (two) times daily.  60 tablet  0  . Multiple Vitamin (MULTIVITAMIN) tablet Take 1 tablet by mouth daily.      . non-metallic deodorant Jethro Poling) MISC Apply 1 application topically daily.      . pantoprazole (PROTONIX) 40 MG tablet TAKE 1 TABLET (40 MG TOTAL) BY MOUTH DAILY.  30 tablet  3  . prochlorperazine (COMPAZINE) 10 MG tablet       . UNABLE TO FIND Rx: L8015-Post Mastectomy Camisole (Quantity: 2) L8000- Post Surgical Bras (Quantity: 6) L3810- Non-Silicone Breast Prosthesis (Quantity: 2) F7510- Silicone Breast Prosthesis (Quantity: 2) Dx: 174.9; Bilateral mastectomy  1 each  0  . valACYclovir (VALTREX) 500 MG tablet Take 1 tablet (500 mg total) by mouth daily.  30 tablet  5    No current facility-administered medications for this encounter.     ALLERGIES: Penicillins   LABORATORY DATA:  Lab Results  Component Value Date   WBC 5.3 02/21/2013   HGB 13.0 02/21/2013   HCT 36.9 02/21/2013   MCV 96.4 02/21/2013   PLT 233 02/21/2013   Lab Results  Component Value Date   NA 139 02/21/2013   K 3.9 02/21/2013   CL 107 11/03/2012   CO2 21* 02/21/2013   Lab Results  Component Value Date   ALT 26 02/21/2013   AST 20 02/21/2013   ALKPHOS 61 02/21/2013   BILITOT 0.57 02/21/2013     NARRATIVE: Cheryl Moore was seen today for weekly treatment management. The chart was checked and the patient's films were reviewed. The patient is doing satisfactorily. No major change in skin irritation over the last week.  PHYSICAL EXAMINATION: vitals were not taken for this visit.     diffuse erythema in the treatment area. The surgical incision is holding up satisfactorily. No skin breakdown.  ASSESSMENT: The patient is doing satisfactorily with treatment.  PLAN: We will continue with the patient's radiation treatment as planned. The patient is beginning her boost treatment.

## 2013-02-28 ENCOUNTER — Ambulatory Visit
Admission: RE | Admit: 2013-02-28 | Discharge: 2013-02-28 | Disposition: A | Discharge status: Home / Self Care | Payer: BC Managed Care – PPO | Source: Ambulatory Visit | Attending: Radiation Oncology | Admitting: Radiation Oncology

## 2013-03-01 ENCOUNTER — Ambulatory Visit
Admission: RE | Admit: 2013-03-01 | Discharge: 2013-03-01 | Disposition: A | Discharge status: Home / Self Care | Payer: BC Managed Care – PPO | Source: Ambulatory Visit | Attending: Radiation Oncology | Admitting: Radiation Oncology

## 2013-03-02 ENCOUNTER — Ambulatory Visit
Admission: RE | Admit: 2013-03-02 | Discharge: 2013-03-02 | Disposition: A | Discharge status: Home / Self Care | Payer: BC Managed Care – PPO | Source: Ambulatory Visit | Attending: Radiation Oncology | Admitting: Radiation Oncology

## 2013-03-02 ENCOUNTER — Ambulatory Visit: Payer: BC Managed Care – PPO

## 2013-03-03 ENCOUNTER — Encounter: Payer: Self-pay | Admitting: Radiation Oncology

## 2013-03-03 ENCOUNTER — Ambulatory Visit
Admission: RE | Admit: 2013-03-03 | Discharge: 2013-03-03 | Disposition: A | Discharge status: Home / Self Care | Payer: BC Managed Care – PPO | Source: Ambulatory Visit | Attending: Radiation Oncology | Admitting: Radiation Oncology

## 2013-03-03 VITALS — BP 125/71 | HR 91 | Temp 97.7°F | Resp 16 | Wt 224.0 lb

## 2013-03-03 DIAGNOSIS — C50419 Malignant neoplasm of upper-outer quadrant of unspecified female breast: Secondary | ICD-10-CM

## 2013-03-03 NOTE — Progress Notes (Signed)
Department of Radiation Oncology  Phone:  [phone] Fax:        [phone]  Weekly Treatment Note    Name: Cheryl Moore Date: 03/03/2013 MRN: [id number] DOB: 04-Jun-1960   Current dose: 62.4 Gy  Current fraction: 34   MEDICATIONS: Current Outpatient Prescriptions  Medication Sig Dispense Refill  . ALPRAZolam (XANAX) 0.25 MG tablet TAKE 1 TABLET BY MOUTH 3 TIMES DAILY AS NEEDED FOR ANXIETY  30 tablet  1  . Ascorbic Acid (VITAMIN C PO) Take 1 tablet by mouth daily.      Marland Kitchen CALCIUM PO Take 1 tablet by mouth daily.      . capecitabine (XELODA) 500 MG tablet Take 4 tablets (2,000 mg total) by mouth 2 (two) times daily after a meal.  250 tablet  4  . gabapentin (NEURONTIN) 100 MG capsule TAKE ONE CAPSULE 3 TIMES A DAY  90 capsule  0  . hyaluronate sodium (RADIAPLEXRX) GEL Apply 1 application topically 2 (two) times daily. Apply after rad tx and in am after showe/bath      . ibuprofen (ADVIL,MOTRIN) 200 MG tablet Take 200-400 mg by mouth every 8 (eight) hours as needed for pain.       Marland Kitchen LORazepam (ATIVAN) 0.5 MG tablet Take 1 tablet (0.5 mg total) by mouth every 8 (eight) hours as needed for anxiety.  30 tablet  0  . metoprolol tartrate (LOPRESSOR) 25 MG tablet Take 0.5 tablets (12.5 mg total) by mouth 2 (two) times daily.  60 tablet  0  . Multiple Vitamin (MULTIVITAMIN) tablet Take 1 tablet by mouth daily.      . non-metallic deodorant Jethro Poling) MISC Apply 1 application topically daily.      . pantoprazole (PROTONIX) 40 MG tablet TAKE 1 TABLET (40 MG TOTAL) BY MOUTH DAILY.  30 tablet  3  . prochlorperazine (COMPAZINE) 10 MG tablet       . UNABLE TO FIND Rx: L8015-Post Mastectomy Camisole (Quantity: 2) L8000- Post Surgical Bras (Quantity: 6) X4481- Non-Silicone Breast Prosthesis (Quantity: 2) E5631- Silicone Breast Prosthesis (Quantity: 2) Dx: 174.9; Bilateral mastectomy  1 each  0  . valACYclovir (VALTREX) 500 MG tablet Take 1 tablet (500 mg total) by mouth daily.  30 tablet  5    No current facility-administered medications for this encounter.     ALLERGIES: Penicillins   LABORATORY DATA:  Lab Results  Component Value Date   WBC 5.3 02/21/2013   HGB 13.0 02/21/2013   HCT 36.9 02/21/2013   MCV 96.4 02/21/2013   PLT 233 02/21/2013   Lab Results  Component Value Date   NA 139 02/21/2013   K 3.9 02/21/2013   CL 107 11/03/2012   CO2 21* 02/21/2013   Lab Results  Component Value Date   ALT 26 02/21/2013   AST 20 02/21/2013   ALKPHOS 61 02/21/2013   BILITOT 0.57 02/21/2013     NARRATIVE: Cheryl Moore was seen today for weekly treatment management. The chart was checked and the patient's films were reviewed. The patient states that she is done well overall over the last week. Skin overall has done well but the area that previously opened the has become more irritated with some skin breakdown in that area. She continues to use Silvadene cream on this spot.  PHYSICAL EXAMINATION: weight is 224 lb (101.606 kg). Her oral temperature is 97.7 F (36.5 C). Her blood pressure is 125/71 and her pulse is 91. Her respiration is 16.      skin  changes as noted above. Superficial breakdown of skin at the superior aspect of the mastectomy scar. This is not deep like it was before and I believe this should heal up satisfactorily after treatment although will take more time than the average skin reaction from radiation treatment.  ASSESSMENT: The patient is doing satisfactorily with treatment.  PLAN: We will continue with the patient's radiation treatment as planned. She will continue her current skin care. She is seeing plastic surgery next week. She'll followup in our clinic in one month.

## 2013-03-03 NOTE — Progress Notes (Signed)
wekly rad txs rt scar boost axilla cw, 34 completed, erytheam, under axilla incision opened again using silvadene ctream there, bagbom cream rest of skin, will call Monday for 1 month f/u  ,appetie fair and fatigued 6:10 PM

## 2013-03-06 ENCOUNTER — Encounter: Payer: Self-pay | Admitting: Radiation Oncology

## 2013-03-06 ENCOUNTER — Ambulatory Visit
Admission: RE | Admit: 2013-03-06 | Discharge: 2013-03-06 | Disposition: A | Discharge status: Home / Self Care | Payer: BC Managed Care – PPO | Source: Ambulatory Visit | Attending: Radiation Oncology | Admitting: Radiation Oncology

## 2013-03-06 ENCOUNTER — Other Ambulatory Visit: Payer: Self-pay | Admitting: Adult Health

## 2013-03-06 ENCOUNTER — Other Ambulatory Visit (HOSPITAL_BASED_OUTPATIENT_CLINIC_OR_DEPARTMENT_OTHER): Payer: BC Managed Care – PPO

## 2013-03-06 ENCOUNTER — Ambulatory Visit (HOSPITAL_BASED_OUTPATIENT_CLINIC_OR_DEPARTMENT_OTHER): Payer: BC Managed Care – PPO

## 2013-03-06 VITALS — BP 133/79 | HR 83 | Temp 97.1°F

## 2013-03-06 DIAGNOSIS — C7951 Secondary malignant neoplasm of bone: Secondary | ICD-10-CM

## 2013-03-06 DIAGNOSIS — C50419 Malignant neoplasm of upper-outer quadrant of unspecified female breast: Secondary | ICD-10-CM

## 2013-03-06 DIAGNOSIS — C50919 Malignant neoplasm of unspecified site of unspecified female breast: Secondary | ICD-10-CM

## 2013-03-06 DIAGNOSIS — Z5111 Encounter for antineoplastic chemotherapy: Secondary | ICD-10-CM

## 2013-03-06 DIAGNOSIS — C50911 Malignant neoplasm of unspecified site of right female breast: Secondary | ICD-10-CM

## 2013-03-06 DIAGNOSIS — C7952 Secondary malignant neoplasm of bone marrow: Secondary | ICD-10-CM

## 2013-03-06 LAB — CBC WITH DIFFERENTIAL/PLATELET
BASO%: 0.2 % (ref 0.0–2.0)
Basophils Absolute: 0 10*3/uL (ref 0.0–0.1)
EOS ABS: 0.1 10*3/uL (ref 0.0–0.5)
EOS%: 1.9 % (ref 0.0–7.0)
HEMATOCRIT: 34.8 % (ref 34.8–46.6)
HEMOGLOBIN: 12.1 g/dL (ref 11.6–15.9)
LYMPH#: 1.2 10*3/uL (ref 0.9–3.3)
LYMPH%: 23 % (ref 14.0–49.7)
MCH: 33.7 pg (ref 25.1–34.0)
MCHC: 34.8 g/dL (ref 31.5–36.0)
MCV: 96.9 fL (ref 79.5–101.0)
MONO#: 0.4 10*3/uL (ref 0.1–0.9)
MONO%: 8.2 % (ref 0.0–14.0)
NEUT%: 66.7 % (ref 38.4–76.8)
NEUTROS ABS: 3.6 10*3/uL (ref 1.5–6.5)
Platelets: 218 10*3/uL (ref 145–400)
RBC: 3.59 10*6/uL — ABNORMAL LOW (ref 3.70–5.45)
RDW: 22.8 % — ABNORMAL HIGH (ref 11.2–14.5)
WBC: 5.4 10*3/uL (ref 3.9–10.3)

## 2013-03-06 LAB — COMPREHENSIVE METABOLIC PANEL (CC13)
ALT: 32 U/L (ref 0–55)
AST: 25 U/L (ref 5–34)
Albumin: 3.7 g/dL (ref 3.5–5.0)
Alkaline Phosphatase: 63 U/L (ref 40–150)
Anion Gap: 11 mEq/L (ref 3–11)
BILIRUBIN TOTAL: 0.48 mg/dL (ref 0.20–1.20)
BUN: 15 mg/dL (ref 7.0–26.0)
CALCIUM: 9.5 mg/dL (ref 8.4–10.4)
CHLORIDE: 109 meq/L (ref 98–109)
CO2: 20 meq/L — AB (ref 22–29)
Creatinine: 0.8 mg/dL (ref 0.6–1.1)
GLUCOSE: 169 mg/dL — AB (ref 70–140)
Potassium: 4 mEq/L (ref 3.5–5.1)
SODIUM: 139 meq/L (ref 136–145)
Total Protein: 6.6 g/dL (ref 6.4–8.3)

## 2013-03-06 MED ORDER — GOSERELIN ACETATE 3.6 MG ~~LOC~~ IMPL
3.6000 mg | DRUG_IMPLANT | SUBCUTANEOUS | Status: DC
  Administered 2013-03-06: 3.6 mg via SUBCUTANEOUS
  Filled 2013-03-06: qty 3.6

## 2013-03-06 MED ORDER — SODIUM CHLORIDE 0.9 % IJ SOLN
10.0000 mL | INTRAMUSCULAR | Status: DC | PRN
  Administered 2013-03-06: 10 mL via INTRAVENOUS
  Filled 2013-03-06: qty 10

## 2013-03-06 MED ORDER — HEPARIN SOD (PORK) LOCK FLUSH 100 UNIT/ML IV SOLN
500.0000 [IU] | Freq: Once | INTRAVENOUS | Status: AC
  Administered 2013-03-06: 500 [IU] via INTRAVENOUS
  Filled 2013-03-06: qty 5

## 2013-03-06 MED ORDER — DENOSUMAB 120 MG/1.7ML ~~LOC~~ SOLN
120.0000 mg | Freq: Once | SUBCUTANEOUS | Status: AC
  Administered 2013-03-06: 120 mg via SUBCUTANEOUS
  Filled 2013-03-06: qty 1.7

## 2013-03-06 NOTE — Patient Instructions (Addendum)
Denosumab injection What is this medicine? DENOSUMAB (den oh sue mab) slows bone breakdown. Prolia is used to treat osteoporosis in women after menopause and in men. Xgeva is used to prevent bone fractures and other bone problems caused by cancer bone metastases. Xgeva is also used to treat giant cell tumor of the bone. This medicine may be used for other purposes; ask your health care provider or pharmacist if you have questions. COMMON BRAND NAME(S): Prolia, XGEVA What should I tell my health care provider before I take this medicine? They need to know if you have any of these conditions: -dental disease -eczema -infection or history of infections -kidney disease or on dialysis -low blood calcium or vitamin D -malabsorption syndrome -scheduled to have surgery or tooth extraction -taking medicine that contains denosumab -thyroid or parathyroid disease -an unusual reaction to denosumab, other medicines, foods, dyes, or preservatives -pregnant or trying to get pregnant -breast-feeding How should I use this medicine? This medicine is for injection under the skin. It is given by a health care professional in a hospital or clinic setting. If you are getting Prolia, a special MedGuide will be given to you by the pharmacist with each prescription and refill. Be sure to read this information carefully each time. For Prolia, talk to your pediatrician regarding the use of this medicine in children. Special care may be needed. For Xgeva, talk to your pediatrician regarding the use of this medicine in children. While this drug may be prescribed for children as young as 13 years for selected conditions, precautions do apply. Overdosage: If you think you've taken too much of this medicine contact a poison control center or emergency room at once. Overdosage: If you think you have taken too much of this medicine contact a poison control center or emergency room at once. NOTE: This medicine is only for  you. Do not share this medicine with others. What if I miss a dose? It is important not to miss your dose. Call your doctor or health care professional if you are unable to keep an appointment. What may interact with this medicine? Do not take this medicine with any of the following medications: -other medicines containing denosumab This medicine may also interact with the following medications: -medicines that suppress the immune system -medicines that treat cancer -steroid medicines like prednisone or cortisone This list may not describe all possible interactions. Give your health care provider a list of all the medicines, herbs, non-prescription drugs, or dietary supplements you use. Also tell them if you smoke, drink alcohol, or use illegal drugs. Some items may interact with your medicine. What should I watch for while using this medicine? Visit your doctor or health care professional for regular checks on your progress. Your doctor or health care professional may order blood tests and other tests to see how you are doing. Call your doctor or health care professional if you get a cold or other infection while receiving this medicine. Do not treat yourself. This medicine may decrease your body's ability to fight infection. You should make sure you get enough calcium and vitamin D while you are taking this medicine, unless your doctor tells you not to. Discuss the foods you eat and the vitamins you take with your health care professional. See your dentist regularly. Brush and floss your teeth as directed. Before you have any dental work done, tell your dentist you are receiving this medicine. Do not become pregnant while taking this medicine or for 5 months after stopping   it. Women should inform their doctor if they wish to become pregnant or think they might be pregnant. There is a potential for serious side effects to an unborn child. Talk to your health care professional or pharmacist for more  information. What side effects may I notice from receiving this medicine? Side effects that you should report to your doctor or health care professional as soon as possible: -allergic reactions like skin rash, itching or hives, swelling of the face, lips, or tongue -breathing problems -chest pain -fast, irregular heartbeat -feeling faint or lightheaded, falls -fever, chills, or any other sign of infection -muscle spasms, tightening, or twitches -numbness or tingling -skin blisters or bumps, or is dry, peels, or red -slow healing or unexplained pain in the mouth or jaw -unusual bleeding or bruising Side effects that usually do not require medical attention (Report these to your doctor or health care professional if they continue or are bothersome.): -muscle pain -stomach upset, gas This list may not describe all possible side effects. Call your doctor for medical advice about side effects. You may report side effects to FDA at 1-800-FDA-1088. Where should I keep my medicine? This medicine is only given in a clinic, doctor's office, or other health care setting and will not be stored at home. NOTE: This sheet is a summary. It may not cover all possible information. If you have questions about this medicine, talk to your doctor, pharmacist, or health care provider.  2014, Elsevier/Gold Standard. (2011-07-13 12:37:47)   Goserelin injection What is this medicine? GOSERELIN (GOE se rel in) is similar to a hormone found in the body. It lowers the amount of sex hormones that the body makes. Men will have lower testosterone levels and women will have lower estrogen levels while taking this medicine. In men, this medicine is used to treat prostate cancer; the injection is either given once per month or once every 12 weeks. A once per month injection (only) is used to treat women with endometriosis, dysfunctional uterine bleeding, or advanced breast cancer. This medicine may be used for other  purposes; ask your health care provider or pharmacist if you have questions. COMMON BRAND NAME(S): Zoladex What should I tell my health care provider before I take this medicine? They need to know if you have any of these conditions (some only apply to women): -diabetes -heart disease or previous heart attack -high blood pressure -high cholesterol -kidney disease -osteoporosis or low bone density -problems passing urine -spinal cord injury -stroke -tobacco smoker -an unusual or allergic reaction to goserelin, hormone therapy, other medicines, foods, dyes, or preservatives -pregnant or trying to get pregnant -breast-feeding How should I use this medicine? This medicine is for injection under the skin. It is given by a health care professional in a hospital or clinic setting. Men receive this injection once every 4 weeks or once every 12 weeks. Women will only receive the once every 4 weeks injection. Talk to your pediatrician regarding the use of this medicine in children. Special care may be needed. Overdosage: If you think you have taken too much of this medicine contact a poison control center or emergency room at once. NOTE: This medicine is only for you. Do not share this medicine with others. What if I miss a dose? It is important not to miss your dose. Call your doctor or health care professional if you are unable to keep an appointment. What may interact with this medicine? -female hormones like estrogen -herbal or dietary supplements like black  cohosh, chasteberry, or DHEA -female hormones like testosterone -prasterone This list may not describe all possible interactions. Give your health care provider a list of all the medicines, herbs, non-prescription drugs, or dietary supplements you use. Also tell them if you smoke, drink alcohol, or use illegal drugs. Some items may interact with your medicine. What should I watch for while using this medicine? Visit your doctor or health  care professional for regular checks on your progress. Your symptoms may appear to get worse during the first weeks of this therapy. Tell your doctor or healthcare professional if your symptoms do not start to get better or if they get worse after this time. Your bones may get weaker if you take this medicine for a long time. If you smoke or frequently drink alcohol you may increase your risk of bone loss. A family history of osteoporosis, chronic use of drugs for seizures (convulsions), or corticosteroids can also increase your risk of bone loss. Talk to your doctor about how to keep your bones strong. This medicine should stop regular monthly menstration in women. Tell your doctor if you continue to [facility]. Women should not become pregnant while taking this medicine or for 12 weeks after stopping this medicine. Women should inform their doctor if they wish to become pregnant or think they might be pregnant. There is a potential for serious side effects to an unborn child. Talk to your health care professional or pharmacist for more information. Do not breast-feed an infant while taking this medicine. Men should inform their doctors if they wish to father a child. This medicine may lower sperm counts. Talk to your health care professional or pharmacist for more information. What side effects may I notice from receiving this medicine? Side effects that you should report to your doctor or health care professional as soon as possible: -allergic reactions like skin rash, itching or hives, swelling of the face, lips, or tongue -bone pain -breathing problems -changes in vision -chest pain -feeling faint or lightheaded, falls -fever, chills -pain, swelling, warmth in the leg -pain, tingling, numbness in the hands or feet -swelling of the ankles, feet, hands -trouble passing urine or change in the amount of urine -unusually high or low blood pressure -unusually weak or tired Side effects that usually  do not require medical attention (report to your doctor or health care professional if they continue or are bothersome): -change in sex drive or performance -changes in breast size in both males and females -changes in emotions or moods -headache -hot flashes -irritation at site where injected -loss of appetite -skin problems like acne, dry skin -vaginal dryness This list may not describe all possible side effects. Call your doctor for medical advice about side effects. You may report side effects to FDA at 1-800-FDA-1088. Where should I keep my medicine? This drug is given in a hospital or clinic and will not be stored at home. NOTE: This sheet is a summary. It may not cover all possible information. If you have questions about this medicine, talk to your doctor, pharmacist, or health care provider.  2014, Elsevier/Gold Standard. (2008-05-29 13:28:29)   Implanted [facility] Guide An implanted port is a type of central line that is placed under the skin. Central lines are used to provide IV access when treatment or nutrition needs to be given through a person's veins. Implanted ports are used for long-term IV access. An implanted port may be placed because:   You need IV medicine that would be irritating  to the small veins in your hands or arms.   You need long-term IV medicines, such as antibiotics.   You need IV nutrition for a long period.   You need frequent blood draws for lab tests.   You need dialysis.  Implanted ports are usually placed in the chest area, but they can also be placed in the upper arm, the abdomen, or the leg. An implanted port has two main parts:   Reservoir. The reservoir is round and will appear as a small, raised area under your skin. The reservoir is the part where a needle is inserted to give medicines or draw blood.   Catheter. The catheter is a thin, flexible tube that extends from the reservoir. The catheter is placed into a large vein. Medicine  that is inserted into the reservoir goes into the catheter and then into the vein.  HOW WILL I CARE FOR MY INCISION SITE? Do not get the incision site wet. Bathe or shower as directed by your health care provider.  HOW IS MY PORT ACCESSED? Special steps must be taken to access the port:   Before the port is accessed, a numbing cream can be placed on the skin. This helps numb the skin over the port site.   Your health care provider uses a sterile technique to access the port.  Your health care provider must put on a mask and sterile gloves.  The skin over your port is cleaned carefully with an antiseptic and allowed to dry.  The port is gently pinched between sterile gloves, and a needle is inserted into the port.  Only "non-coring" port needles should be used to access the port. Once the port is accessed, a blood return should be checked. This helps ensure that the port is in the vein and is not clogged.   If your port needs to remain accessed for a constant infusion, a clear (transparent) bandage will be placed over the needle site. The bandage and needle will need to be changed every week, or as directed by your health care provider.   Keep the bandage covering the needle clean and dry. Do not get it wet. Follow your health care provider's instructions on how to take a shower or bath while the port is accessed.   If your port does not need to stay accessed, no bandage is needed over the port.  WHAT IS FLUSHING? Flushing helps keep the port from getting clogged. Follow your health care provider's instructions on how and when to flush the port. Ports are usually flushed with saline solution or a medicine called heparin. The need for flushing will depend on how the port is used.   If the port is used for intermittent medicines or blood draws, the port will need to be flushed:   After medicines have been given.   After blood has been drawn.   As part of routine maintenance.    If a constant infusion is running, the port may not need to be flushed.  HOW LONG WILL MY PORT STAY IMPLANTED? The port can stay in for as long as your health care provider thinks it is needed. When it is time for the port to come out, surgery will be done to remove it. The procedure is similar to the one performed when the port was put in.  WHEN SHOULD I SEEK IMMEDIATE MEDICAL CARE? When you have an implanted port, you should seek immediate medical care if:   You notice a bad  smell coming from the incision site.   You have swelling, redness, or drainage at the incision site.   You have more swelling or pain at the port site or the surrounding area.   You have a fever that is not controlled with medicine. Document Released: 01/12/2005 Document Revised: 11/02/2012 Document Reviewed: 09/19/2012 [facility] Patient Information 2014 [place].

## 2013-03-10 ENCOUNTER — Telehealth: Payer: Self-pay | Admitting: *Deleted

## 2013-03-10 ENCOUNTER — Telehealth: Payer: Self-pay

## 2013-03-10 NOTE — Telephone Encounter (Signed)
Patient states that she is having flu like symptoms, diarrhea, fever, nausea.  States that her mother was diagnosed with the flu earlier this week and the whole family is now sick and she would like something called in.  She also said she does not have a PCP.  Dr Humphrey Rolls would like her to go to an urgent office or the ER if her symptoms get worse.  Patient said she did not want to go to an urgent office.  I offered to see if we could get her an appointment here in the office but she said she could not leave the house and just wanted something called in.  She then hung up the phone.

## 2013-03-10 NOTE — Telephone Encounter (Signed)
Patient call came to triage nurse, patient calling in to report that she just finished chemo and radiation last week and woke up this morning with the flu. She states her mother was diagnosed yesterday, and her and husband woke up sick today.  Patient states she does not have primary MD to call and wants to know what Dr Humphrey Rolls thinks she should do. She felt feverish, took some aspirin and still has temp of 101.2. Non-productive cough, some nausea but no vomiting.  Informed patient I would share this information with Dr Humphrey Rolls and her staff and we would call her back with further instructions.

## 2013-03-30 NOTE — Addendum Note (Signed)
Encounter addended by: [name], MD on: 03/30/2013  8:17 PM<BR>     Documentation filed: Notes Section, Visit Diagnoses

## 2013-03-30 NOTE — Progress Notes (Signed)
  Radiation Oncology         [phone]) [phone] ________________________________  Name: Cheryl Moore MRN: [id number]  Date: 03/06/2013  DOB: 19-Jan-1961  End of Treatment Note  Diagnosis:   Invasive ductal carcinoma of the right breast     Indication for treatment:  Palliative       Radiation treatment dates:   11/28/2012 through 03/06/2013  Site/dose:    1.  The patient was treated to the right chest wall and right supraclavicular region using a 3-D conformal technique.   The patient received 50.4 gray to these areas and the patient did need to be read planned during her treatment due to a break in treatment secondary to wound healing issues.   The patient then received a boost treatment using an en face electron field for an additional 14 gray. The patient's final dose was 64.4 gray.  2.  the patient was treated to an area of metastasis within the right hip. This consisted of a 2 field technique to a dose of 37.5 gray at 2.5 gray per fraction.   Narrative: The patient tolerated radiation treatment relatively well.   The patient did have an area within the mastectomy scar which opened during treatment. Her treatment therefore required a break  While this area healed.the patient was able to resume her treatment and complete the prescribed course. Her skin did relatively well during treatment otherwise without significant moist desquamation.   Plan: The patient has completed radiation treatment. The patient will return to radiation oncology clinic for routine followup in one month. I advised the patient to call or return sooner if they have any questions or concerns related to their recovery or treatment. ________________________________  Jodelle Gross, M.D., Ph.D.

## 2013-03-30 NOTE — Addendum Note (Signed)
Encounter addended by: [name], MD on: 03/30/2013  8:24 PM<BR>     Documentation filed: Notes Section

## 2013-03-30 NOTE — Progress Notes (Signed)
Complex simulation note  Diagnosis: Breast cancer  Narrative The patient return to clinic today for a re- simulation. The patient has had a break in her treatment to 2 wound healing issues and she returns to clinic today to undergo further treatment planning. A CT scan was obtained with the patient in the treatment position. A total of 4 additional customized fields will be designed to treat the patient to the right chest wall and right supraclavicular region. These correspond to 4 medically necessary complex treatment devices. The patient will be treated in such a fashion for a total of 50.4 gray. She then will proceed with a boost treatment.  ------------------------------------------------  Jodelle Gross, MD, PhD

## 2013-03-30 NOTE — Progress Notes (Signed)
  Radiation Oncology         [phone]) [phone] ________________________________  Name: Cheryl Moore MRN: [id number]  Date: 02/21/2013  DOB: 16-Sep-1960  Complex simulation note  The patient has undergone complex simulation for her upcoming boost treatment for her diagnosis of breast cancer. The patient has initially been planned to receive 50.4 Gy. The patient will now receive a 14 Gy boost to the mastectomy scar which has been contoured/delineated. This will be accomplished using an en face electron field. Based on the depth of the target area, 6 MeV electrons will be used and this field has been normalized to the 90 % isodose line. The patient's final total dose therefore will be 14 Gy. A special port plan is requested for the boost treatment.   _______________________________  Jodelle Gross, MD, PhD

## 2013-04-03 ENCOUNTER — Other Ambulatory Visit: Payer: BC Managed Care – PPO

## 2013-04-03 ENCOUNTER — Encounter: Payer: Self-pay | Admitting: Adult Health

## 2013-04-03 ENCOUNTER — Ambulatory Visit: Payer: BC Managed Care – PPO

## 2013-04-03 ENCOUNTER — Telehealth: Payer: Self-pay | Admitting: *Deleted

## 2013-04-03 ENCOUNTER — Other Ambulatory Visit (HOSPITAL_BASED_OUTPATIENT_CLINIC_OR_DEPARTMENT_OTHER): Payer: BC Managed Care – PPO

## 2013-04-03 ENCOUNTER — Ambulatory Visit (HOSPITAL_BASED_OUTPATIENT_CLINIC_OR_DEPARTMENT_OTHER): Payer: BC Managed Care – PPO | Admitting: Adult Health

## 2013-04-03 ENCOUNTER — Ambulatory Visit (HOSPITAL_BASED_OUTPATIENT_CLINIC_OR_DEPARTMENT_OTHER): Payer: BC Managed Care – PPO

## 2013-04-03 VITALS — BP 121/73 | HR 81 | Temp 98.0°F

## 2013-04-03 VITALS — BP 121/73 | HR 77 | Temp 97.4°F | Resp 18 | Ht 63.0 in | Wt 223.7 lb

## 2013-04-03 DIAGNOSIS — C50419 Malignant neoplasm of upper-outer quadrant of unspecified female breast: Secondary | ICD-10-CM

## 2013-04-03 DIAGNOSIS — M2550 Pain in unspecified joint: Secondary | ICD-10-CM

## 2013-04-03 DIAGNOSIS — Z5111 Encounter for antineoplastic chemotherapy: Secondary | ICD-10-CM

## 2013-04-03 DIAGNOSIS — C7952 Secondary malignant neoplasm of bone marrow: Secondary | ICD-10-CM

## 2013-04-03 DIAGNOSIS — C7951 Secondary malignant neoplasm of bone: Secondary | ICD-10-CM

## 2013-04-03 DIAGNOSIS — Z9010 Acquired absence of unspecified breast and nipple: Secondary | ICD-10-CM

## 2013-04-03 DIAGNOSIS — C50411 Malignant neoplasm of upper-outer quadrant of right female breast: Secondary | ICD-10-CM

## 2013-04-03 DIAGNOSIS — Z95828 Presence of other vascular implants and grafts: Secondary | ICD-10-CM

## 2013-04-03 DIAGNOSIS — E2839 Other primary ovarian failure: Secondary | ICD-10-CM

## 2013-04-03 LAB — COMPREHENSIVE METABOLIC PANEL (CC13)
ALBUMIN: 3.6 g/dL (ref 3.5–5.0)
ALK PHOS: 66 U/L (ref 40–150)
ALT: 22 U/L (ref 0–55)
ANION GAP: 9 meq/L (ref 3–11)
AST: 20 U/L (ref 5–34)
BUN: 13.9 mg/dL (ref 7.0–26.0)
CALCIUM: 9.7 mg/dL (ref 8.4–10.4)
CO2: 23 meq/L (ref 22–29)
Chloride: 109 mEq/L (ref 98–109)
Creatinine: 0.8 mg/dL (ref 0.6–1.1)
Glucose: 146 mg/dl — ABNORMAL HIGH (ref 70–140)
POTASSIUM: 3.9 meq/L (ref 3.5–5.1)
Sodium: 140 mEq/L (ref 136–145)
Total Bilirubin: 0.39 mg/dL (ref 0.20–1.20)
Total Protein: 6.7 g/dL (ref 6.4–8.3)

## 2013-04-03 LAB — CBC WITH DIFFERENTIAL/PLATELET
BASO%: 0.9 % (ref 0.0–2.0)
Basophils Absolute: 0 10*3/uL (ref 0.0–0.1)
EOS%: 1.5 % (ref 0.0–7.0)
Eosinophils Absolute: 0.1 10*3/uL (ref 0.0–0.5)
HEMATOCRIT: 35.4 % (ref 34.8–46.6)
HGB: 12.3 g/dL (ref 11.6–15.9)
LYMPH#: 0.9 10*3/uL (ref 0.9–3.3)
LYMPH%: 23 % (ref 14.0–49.7)
MCH: 35.4 pg — ABNORMAL HIGH (ref 25.1–34.0)
MCHC: 34.7 g/dL (ref 31.5–36.0)
MCV: 102 fL — ABNORMAL HIGH (ref 79.5–101.0)
MONO#: 0.6 10*3/uL (ref 0.1–0.9)
MONO%: 14.3 % — ABNORMAL HIGH (ref 0.0–14.0)
NEUT#: 2.4 10*3/uL (ref 1.5–6.5)
NEUT%: 60.3 % (ref 38.4–76.8)
Platelets: 257 10*3/uL (ref 145–400)
RBC: 3.47 10*6/uL — ABNORMAL LOW (ref 3.70–5.45)
RDW: 15.6 % — ABNORMAL HIGH (ref 11.2–14.5)
WBC: 3.9 10*3/uL (ref 3.9–10.3)

## 2013-04-03 MED ORDER — SODIUM CHLORIDE 0.9 % IJ SOLN
10.0000 mL | INTRAMUSCULAR | Status: DC | PRN
  Administered 2013-04-03: 10 mL via INTRAVENOUS
  Filled 2013-04-03: qty 10

## 2013-04-03 MED ORDER — HEPARIN SOD (PORK) LOCK FLUSH 100 UNIT/ML IV SOLN
500.0000 [IU] | Freq: Once | INTRAVENOUS | Status: AC
  Administered 2013-04-03: 500 [IU] via INTRAVENOUS
  Filled 2013-04-03: qty 5

## 2013-04-03 MED ORDER — EXEMESTANE 25 MG PO TABS: 25.0000 mg | ORAL_TABLET | Freq: Every day | ORAL | Status: DC

## 2013-04-03 MED ORDER — DENOSUMAB 120 MG/1.7ML ~~LOC~~ SOLN
120.0000 mg | Freq: Once | SUBCUTANEOUS | Status: AC
  Administered 2013-04-03: 120 mg via SUBCUTANEOUS
  Filled 2013-04-03: qty 1.7

## 2013-04-03 MED ORDER — TRAMADOL HCL 50 MG PO TABS: 50.0000 mg | ORAL_TABLET | Freq: Four times a day (QID) | ORAL | Status: DC | PRN

## 2013-04-03 MED ORDER — GOSERELIN ACETATE 3.6 MG ~~LOC~~ IMPL
3.6000 mg | DRUG_IMPLANT | SUBCUTANEOUS | Status: DC
  Administered 2013-04-03: 3.6 mg via SUBCUTANEOUS
  Filled 2013-04-03: qty 3.6

## 2013-04-03 NOTE — Telephone Encounter (Signed)
appts made and printed. Pt is aware that cs will call w/ appt CT abd/pelvis/chest...td

## 2013-04-03 NOTE — Patient Instructions (Signed)
Exemestane tablets What is this medicine? EXEMESTANE (ex e MES tane) blocks the production of the hormone estrogen. Some types of breast cancer depend on estrogen to grow, and this medicine can stop tumor growth by blocking estrogen production. This medicine is for the treatment of breast cancer in postmenopausal women only. This medicine may be used for other purposes; ask your health care provider or pharmacist if you have questions. COMMON BRAND NAME(S): Aromasin What should I tell my health care provider before I take this medicine? They need to know if you have any of these conditions: -an unusual or allergic reaction to exemestane, other medicines, foods, dyes, or preservatives -pregnant or trying to get pregnant -breast-feeding How should I use this medicine? Take this medicine by mouth with a glass of water. Follow the directions on the prescription label. Take your doses at regular intervals after a meal. Do not take your medicine more often than directed. Do not stop taking except on the advice of your doctor or health care professional. Contact your pediatrician regarding the use of this medicine in children. Special care may be needed. Overdosage: If you think you have taken too much of this medicine contact a poison control center or emergency room at once. NOTE: This medicine is only for you. Do not share this medicine with others. What if I miss a dose? If you miss a dose, take the next dose as usual. Do not try to make up the missed dose. Do not take double or extra doses. What may interact with this medicine? Do not take this medicine with any of the following medications: -female hormones, like estrogens and birth control pills This medicine may also interact with the following medications: -androstenedione -phenytoin -rifabutin, rifampin, or rifapentine -St. John's Wort This list may not describe all possible interactions. Give your health care provider a list of all the  medicines, herbs, non-prescription drugs, or dietary supplements you use. Also tell them if you smoke, drink alcohol, or use illegal drugs. Some items may interact with your medicine. What should I watch for while using this medicine? Visit your doctor or health care professional for regular checks on your progress. If you experience hot flashes or sweating while taking this medicine, avoid alcohol, smoking and drinks with caffeine. This may help to decrease these side effects. What side effects may I notice from receiving this medicine? Side effects that you should report to your doctor or health care professional as soon as possible: -any new or unusual symptoms -changes in vision -fever -leg or arm swelling -pain in bones, joints, or muscles -pain in hips, back, ribs, arms, shoulders, or legs Side effects that usually do not require medical attention (report to your doctor or health care professional if they continue or are bothersome): -difficulty sleeping -headache -hot flashes -sweating -unusually weak or tired This list may not describe all possible side effects. Call your doctor for medical advice about side effects. You may report side effects to FDA at 1-800-FDA-1088. Where should I keep my medicine? Keep out of the reach of children. Store at room temperature between 15 and 30 degrees C (59 and 86 degrees F). Throw away any unused medicine after the expiration date. NOTE: This sheet is a summary. It may not cover all possible information. If you have questions about this medicine, talk to your doctor, pharmacist, or health care provider.  2014, Elsevier/Gold Standard. (2007-05-17 11:48:29) Tramadol tablets What is this medicine? TRAMADOL (TRA ma dole) is a pain reliever. It  is used to treat moderate to severe pain in adults. This medicine may be used for other purposes; ask your health care provider or pharmacist if you have questions. COMMON BRAND NAME(S): Ultram What should  I tell my health care provider before I take this medicine? They need to know if you have any of these conditions: -brain tumor -depression -drug abuse or addiction -head injury -if you frequently drink alcohol containing drinks -kidney disease or trouble passing urine -liver disease -lung disease, asthma, or breathing problems -seizures or epilepsy -suicidal thoughts, plans, or attempt; a previous suicide attempt by you or a family member -an unusual or allergic reaction to tramadol, codeine, other medicines, foods, dyes, or preservatives -pregnant or trying to get pregnant -breast-feeding How should I use this medicine? Take this medicine by mouth with a full glass of water. Follow the directions on the prescription label. If the medicine upsets your stomach, take it with food or milk. Do not take more medicine than you are told to take. Talk to your pediatrician regarding the use of this medicine in children. Special care may be needed. Overdosage: If you think you have taken too much of this medicine contact a poison control center or emergency room at once. NOTE: This medicine is only for you. Do not share this medicine with others. What if I miss a dose? If you miss a dose, take it as soon as you can. If it is almost time for your next dose, take only that dose. Do not take double or extra doses. What may interact with this medicine? Do not take this medicine with any of the following medications: -MAOIs like Carbex, Eldepryl, Marplan, Nardil, and Parnate This medicine may also interact with the following medications: -alcohol or medicines that contain alcohol -antihistamines -benzodiazepines -bupropion -carbamazepine or oxcarbazepine -clozapine -cyclobenzaprine -digoxin -furazolidone -linezolid -medicines for depression, anxiety, or psychotic disturbances -medicines for migraine headache like almotriptan, eletriptan, frovatriptan, naratriptan, rizatriptan, sumatriptan,  zolmitriptan -medicines for pain like pentazocine, buprenorphine, butorphanol, meperidine, nalbuphine, and propoxyphene -medicines for sleep -muscle relaxants -naltrexone -phenobarbital -phenothiazines like perphenazine, thioridazine, chlorpromazine, mesoridazine, fluphenazine, prochlorperazine, promazine, and trifluoperazine -procarbazine -warfarin This list may not describe all possible interactions. Give your health care provider a list of all the medicines, herbs, non-prescription drugs, or dietary supplements you use. Also tell them if you smoke, drink alcohol, or use illegal drugs. Some items may interact with your medicine. What should I watch for while using this medicine? Tell your doctor or health care professional if your pain does not go away, if it gets worse, or if you have new or a different type of pain. You may develop tolerance to the medicine. Tolerance means that you will need a higher dose of the medicine for pain relief. Tolerance is normal and is expected if you take this medicine for a long time. Do not suddenly stop taking your medicine because you may develop a severe reaction. Your body becomes used to the medicine. This does NOT mean you are addicted. Addiction is a behavior related to getting and using a drug for a non-medical reason. If you have pain, you have a medical reason to take pain medicine. Your doctor will tell you how much medicine to take. If your doctor wants you to stop the medicine, the dose will be slowly lowered over time to avoid any side effects. You may get drowsy or dizzy. Do not drive, use machinery, or do anything that needs mental alertness until you know how this medicine  affects you. Do not stand or sit up quickly, especially if you are an older patient. This reduces the risk of dizzy or fainting spells. Alcohol can increase or decrease the effects of this medicine. Avoid alcoholic drinks. You may have constipation. Try to have a bowel movement at  least every 2 to 3 days. If you do not have a bowel movement for 3 days, call your doctor or health care professional. Your mouth may get dry. Chewing sugarless gum or sucking hard candy, and drinking plenty of water may help. Contact your doctor if the problem does not go away or is severe. What side effects may I notice from receiving this medicine? Side effects that you should report to your doctor or health care professional as soon as possible: -allergic reactions like skin rash, itching or hives, swelling of the face, lips, or tongue -breathing difficulties, wheezing -confusion -itching -light headedness or fainting spells -redness, blistering, peeling or loosening of the skin, including inside the mouth -seizures Side effects that usually do not require medical attention (report to your doctor or health care professional if they continue or are bothersome): -constipation -dizziness -drowsiness -headache -nausea, vomiting This list may not describe all possible side effects. Call your doctor for medical advice about side effects. You may report side effects to FDA at 1-800-FDA-1088. Where should I keep my medicine? Keep out of the reach of children. Store at room temperature between 15 and 30 degrees C (59 and 86 degrees F). Keep container tightly closed. Throw away any unused medicine after the expiration date. NOTE: This sheet is a summary. It may not cover all possible information. If you have questions about this medicine, talk to your doctor, pharmacist, or health care provider.  2014, Elsevier/Gold Standard. (2009-09-25 11:55:44)

## 2013-04-03 NOTE — Patient Instructions (Signed)
Denosumab injection What is this medicine? DENOSUMAB (den oh sue mab) slows bone breakdown. Prolia is used to treat osteoporosis in women after menopause and in men. Xgeva is used to prevent bone fractures and other bone problems caused by cancer bone metastases. Xgeva is also used to treat giant cell tumor of the bone. This medicine may be used for other purposes; ask your health care provider or pharmacist if you have questions. COMMON BRAND NAME(S): Prolia, XGEVA What should I tell my health care provider before I take this medicine? They need to know if you have any of these conditions: -dental disease -eczema -infection or history of infections -kidney disease or on dialysis -low blood calcium or vitamin D -malabsorption syndrome -scheduled to have surgery or tooth extraction -taking medicine that contains denosumab -thyroid or parathyroid disease -an unusual reaction to denosumab, other medicines, foods, dyes, or preservatives -pregnant or trying to get pregnant -breast-feeding How should I use this medicine? This medicine is for injection under the skin. It is given by a health care professional in a hospital or clinic setting. If you are getting Prolia, a special MedGuide will be given to you by the pharmacist with each prescription and refill. Be sure to read this information carefully each time. For Prolia, talk to your pediatrician regarding the use of this medicine in children. Special care may be needed. For Xgeva, talk to your pediatrician regarding the use of this medicine in children. While this drug may be prescribed for children as young as 13 years for selected conditions, precautions do apply. Overdosage: If you think you've taken too much of this medicine contact a poison control center or emergency room at once. Overdosage: If you think you have taken too much of this medicine contact a poison control center or emergency room at once. NOTE: This medicine is only for  you. Do not share this medicine with others. What if I miss a dose? It is important not to miss your dose. Call your doctor or health care professional if you are unable to keep an appointment. What may interact with this medicine? Do not take this medicine with any of the following medications: -other medicines containing denosumab This medicine may also interact with the following medications: -medicines that suppress the immune system -medicines that treat cancer -steroid medicines like prednisone or cortisone This list may not describe all possible interactions. Give your health care provider a list of all the medicines, herbs, non-prescription drugs, or dietary supplements you use. Also tell them if you smoke, drink alcohol, or use illegal drugs. Some items may interact with your medicine. What should I watch for while using this medicine? Visit your doctor or health care professional for regular checks on your progress. Your doctor or health care professional may order blood tests and other tests to see how you are doing. Call your doctor or health care professional if you get a cold or other infection while receiving this medicine. Do not treat yourself. This medicine may decrease your body's ability to fight infection. You should make sure you get enough calcium and vitamin D while you are taking this medicine, unless your doctor tells you not to. Discuss the foods you eat and the vitamins you take with your health care professional. See your dentist regularly. Brush and floss your teeth as directed. Before you have any dental work done, tell your dentist you are receiving this medicine. Do not become pregnant while taking this medicine or for 5 months after stopping   it. Women should inform their doctor if they wish to become pregnant or think they might be pregnant. There is a potential for serious side effects to an unborn child. Talk to your health care professional or pharmacist for more  information. What side effects may I notice from receiving this medicine? Side effects that you should report to your doctor or health care professional as soon as possible: -allergic reactions like skin rash, itching or hives, swelling of the face, lips, or tongue -breathing problems -chest pain -fast, irregular heartbeat -feeling faint or lightheaded, falls -fever, chills, or any other sign of infection -muscle spasms, tightening, or twitches -numbness or tingling -skin blisters or bumps, or is dry, peels, or red -slow healing or unexplained pain in the mouth or jaw -unusual bleeding or bruising Side effects that usually do not require medical attention (Report these to your doctor or health care professional if they continue or are bothersome.): -muscle pain -stomach upset, gas This list may not describe all possible side effects. Call your doctor for medical advice about side effects. You may report side effects to FDA at 1-800-FDA-1088. Where should I keep my medicine? This medicine is only given in a clinic, doctor's office, or other health care setting and will not be stored at home. NOTE: This sheet is a summary. It may not cover all possible information. If you have questions about this medicine, talk to your doctor, pharmacist, or health care provider.  2014, [employer]vier/Gold Standard. (2011-07-13 12:37:47)  Goserelin injection What is this medicine? GOSERELIN (GOE se rel in) is similar to a hormone found in the body. It lowers the amount of sex hormones that the body makes. Men will have lower testosterone levels and women will have lower estrogen levels while taking this medicine. In men, this medicine is used to treat prostate cancer; the injection is either given once per month or once every 12 weeks. A once per month injection (only) is used to treat women with endometriosis, dysfunctional uterine bleeding, or advanced breast cancer. This medicine may be used for other  purposes; ask your health care provider or pharmacist if you have questions. COMMON BRAND NAME(S): Zoladex What should I tell my health care provider before I take this medicine? They need to know if you have any of these conditions (some only apply to women): -diabetes -heart disease or previous heart attack -high blood pressure -high cholesterol -kidney disease -osteoporosis or low bone density -problems passing urine -spinal cord injury -stroke -tobacco smoker -an unusual or allergic reaction to goserelin, hormone therapy, other medicines, foods, dyes, or preservatives -pregnant or trying to get pregnant -breast-feeding How should I use this medicine? This medicine is for injection under the skin. It is given by a health care professional in a hospital or clinic setting. Men receive this injection once every 4 weeks or once every 12 weeks. Women will only receive the once every 4 weeks injection. Talk to your pediatrician regarding the use of this medicine in children. Special care may be needed. Overdosage: If you think you have taken too much of this medicine contact a poison control center or emergency room at once. NOTE: This medicine is only for you. Do not share this medicine with others. What if I miss a dose? It is important not to miss your dose. Call your doctor or health care professional if you are unable to keep an appointment. What may interact with this medicine? -female hormones like estrogen -herbal or dietary supplements like black cohosh,  chasteberry, or DHEA -female hormones like testosterone -prasterone This list may not describe all possible interactions. Give your health care provider a list of all the medicines, herbs, non-prescription drugs, or dietary supplements you use. Also tell them if you smoke, drink alcohol, or use illegal drugs. Some items may interact with your medicine. What should I watch for while using this medicine? Visit your doctor or health  care professional for regular checks on your progress. Your symptoms may appear to get worse during the first weeks of this therapy. Tell your doctor or healthcare professional if your symptoms do not start to get better or if they get worse after this time. Your bones may get weaker if you take this medicine for a long time. If you smoke or frequently drink alcohol you may increase your risk of bone loss. A family history of osteoporosis, chronic use of drugs for seizures (convulsions), or corticosteroids can also increase your risk of bone loss. Talk to your doctor about how to keep your bones strong. This medicine should stop regular monthly menstration in women. Tell your doctor if you continue to [facility]. Women should not become pregnant while taking this medicine or for 12 weeks after stopping this medicine. Women should inform their doctor if they wish to become pregnant or think they might be pregnant. There is a potential for serious side effects to an unborn child. Talk to your health care professional or pharmacist for more information. Do not breast-feed an infant while taking this medicine. Men should inform their doctors if they wish to father a child. This medicine may lower sperm counts. Talk to your health care professional or pharmacist for more information. What side effects may I notice from receiving this medicine? Side effects that you should report to your doctor or health care professional as soon as possible: -allergic reactions like skin rash, itching or hives, swelling of the face, lips, or tongue -bone pain -breathing problems -changes in vision -chest pain -feeling faint or lightheaded, falls -fever, chills -pain, swelling, warmth in the leg -pain, tingling, numbness in the hands or feet -swelling of the ankles, feet, hands -trouble passing urine or change in the amount of urine -unusually high or low blood pressure -unusually weak or tired Side effects that usually  do not require medical attention (report to your doctor or health care professional if they continue or are bothersome): -change in sex drive or performance -changes in breast size in both males and females -changes in emotions or moods -headache -hot flashes -irritation at site where injected -loss of appetite -skin problems like acne, dry skin -vaginal dryness This list may not describe all possible side effects. Call your doctor for medical advice about side effects. You may report side effects to FDA at 1-800-FDA-1088. Where should I keep my medicine? This drug is given in a hospital or clinic and will not be stored at home. NOTE: This sheet is a summary. It may not cover all possible information. If you have questions about this medicine, talk to your doctor, pharmacist, or health care provider.  2014, Elsevier/Gold Standard. (2008-05-29 13:28:29)
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
## 2013-04-06 ENCOUNTER — Other Ambulatory Visit: Payer: Self-pay | Admitting: Adult Health

## 2013-04-06 ENCOUNTER — Ambulatory Visit: Payer: BC Managed Care – PPO | Admitting: Radiation Oncology

## 2013-04-10 ENCOUNTER — Other Ambulatory Visit: Payer: Self-pay | Admitting: Adult Health

## 2013-04-10 ENCOUNTER — Encounter: Payer: Self-pay | Admitting: Radiation Oncology

## 2013-04-10 ENCOUNTER — Telehealth: Payer: Self-pay

## 2013-04-10 DIAGNOSIS — C50419 Malignant neoplasm of upper-outer quadrant of unspecified female breast: Secondary | ICD-10-CM

## 2013-04-10 MED ORDER — ALPRAZOLAM 0.25 MG PO TABS: 0.2500 mg | ORAL_TABLET | Freq: Every evening | ORAL | Status: DC | PRN

## 2013-04-10 NOTE — Telephone Encounter (Signed)
Informed pt requested refill for xanax would be ready for pickup after munch.  Pt voiced understanding.

## 2013-04-13 ENCOUNTER — Ambulatory Visit
Admission: RE | Admit: 2013-04-13 | Discharge: 2013-04-13 | Disposition: A | Discharge status: Home / Self Care | Payer: BC Managed Care – PPO | Source: Ambulatory Visit | Attending: Radiation Oncology | Admitting: Radiation Oncology

## 2013-04-13 ENCOUNTER — Encounter: Payer: Self-pay | Admitting: Radiation Oncology

## 2013-04-13 VITALS — BP 126/70 | HR 70 | Temp 97.4°F | Resp 20 | Wt 225.8 lb

## 2013-04-13 DIAGNOSIS — C50419 Malignant neoplasm of upper-outer quadrant of unspecified female breast: Secondary | ICD-10-CM

## 2013-04-13 HISTORY — DX: Personal history of irradiation: Z92.3

## 2013-04-13 NOTE — Progress Notes (Signed)
Radiation Oncology         [phone] ________________________________  Name: Cheryl Moore MRN: [id number]  Date: 04/13/2013  DOB: [birth year]  Follow-Up Visit Note  CC: No PCP Per Patient  Deatra Robinson, MD  Diagnosis:   Invasive ductal carcinoma of the right breast, metastatic  Interval Since Last Radiation:  One month   Narrative:  The patient returns today for routine follow-up.  The patient states she has done well in terms of healing. Her appetite is good. No nausea. The skin has healed very well she feels. The patient has begun anti-hormonal treatment. A PET/CT scan is scheduled in June.               The patient did note some ongoing pain in the right hip region. This is worse when she sits down although it does, and go throughout the day. However this has been fairly constant over the last several weeks.                 ALLERGIES:  is allergic to penicillins.  Meds: Current Outpatient Prescriptions  Medication Sig Dispense Refill  . ALPRAZolam (XANAX) 0.25 MG tablet Take 1 tablet (0.25 mg total) by mouth at bedtime as needed for anxiety.  30 tablet  1  . Ascorbic Acid (VITAMIN C PO) Take 1 tablet by mouth daily.      Marland Kitchen CALCIUM PO Take 1 tablet by mouth daily.      Marland Kitchen exemestane (AROMASIN) 25 MG tablet Take 1 tablet (25 mg total) by mouth daily after breakfast.  30 tablet  3  . gabapentin (NEURONTIN) 100 MG capsule TAKE ONE CAPSULE 3 TIMES A DAY  90 capsule  0  . ibuprofen (ADVIL,MOTRIN) 200 MG tablet Take 200-400 mg by mouth every 8 (eight) hours as needed for pain.       Marland Kitchen LORazepam (ATIVAN) 0.5 MG tablet TAKE 1 TABLET BY MOUTH EVERY 8 HOURS AS NEEDED FOR ANXIETY  30 tablet  0  . metoprolol tartrate (LOPRESSOR) 25 MG tablet Take 0.5 tablets (12.5 mg total) by mouth 2 (two) times daily.  60 tablet  0  . Multiple Vitamin (MULTIVITAMIN) tablet Take 1 tablet by mouth daily.      . non-metallic deodorant Jethro Poling) MISC Apply 1 application topically daily.      . pantoprazole  (PROTONIX) 40 MG tablet TAKE 1 TABLET (40 MG TOTAL) BY MOUTH DAILY.  30 tablet  3  . prochlorperazine (COMPAZINE) 10 MG tablet       . traMADol (ULTRAM) 50 MG tablet Take 1 tablet (50 mg total) by mouth every 6 (six) hours as needed.  30 tablet  2  . UNABLE TO FIND Rx: L8015-Post Mastectomy Camisole (Quantity: 2) L8000- Post Surgical Bras (Quantity: 6) T2671- Non-Silicone Breast Prosthesis (Quantity: 2) I4580- Silicone Breast Prosthesis (Quantity: 2) Dx: 174.9; Bilateral mastectomy  1 each  0  . valACYclovir (VALTREX) 500 MG tablet Take 1 tablet (500 mg total) by mouth daily.  30 tablet  5   No current facility-administered medications for this encounter.    Physical Findings: The patient is in no acute distress. Patient is alert and oriented.  weight is 225 lb 12.8 oz (102.422 kg). Her oral temperature is 97.4 F (36.3 C). Her blood pressure is 126/70 and her pulse is 70. Her respiration is 20. .   The patient's skin looks excellent. The mastectomy scar remains well healed/approximated. Some diffuse hyperpigmentation present.  Lab Findings: Lab Results  Component  Value Date   WBC 3.9 04/03/2013   HGB 12.3 04/03/2013   HCT 35.4 04/03/2013   MCV 102.0* 04/03/2013   PLT 257 04/03/2013     Radiographic Findings: No results found.  Impression:    The patient has done well clinically since completing her course of radiation treatment. Her skin looks great.  I have had a chance to review her imaging including her last PET scan which was performed in 2014. The patient did have some hypermetabolic activity in the right acetabular region superiorly. I reviewed this with the patient and showed her this study. She potentially would be a candidate for palliative radiation to this area if her pain continues or worsens. We discussed possible options. She favor holding off on treatment for now which I believe is reasonable and waiting to review the upcoming scans in June to see if there is any correlation  at that time with her symptoms.  Plan:  The patient will followup in June after her upcoming restaging scans. In particular we will discuss how her right hip is doing. The patient knows to contact our office in her if she has any worsening symptoms which she would like for [place] to evaluate.   Jodelle Gross, M.D., Ph.D.

## 2013-04-13 NOTE — Progress Notes (Addendum)
Follow up s/p rad tx right cw, well healed, some discororation, using bag abaum lotion,  Using a diabetic lotion also,  Appetite great, no nausea, on Aromason daily, pet / ct scheduled in June 2015 3:49 PM

## 2013-05-01 ENCOUNTER — Other Ambulatory Visit: Payer: Self-pay

## 2013-05-01 ENCOUNTER — Other Ambulatory Visit (HOSPITAL_BASED_OUTPATIENT_CLINIC_OR_DEPARTMENT_OTHER): Payer: BC Managed Care – PPO

## 2013-05-01 ENCOUNTER — Ambulatory Visit (HOSPITAL_BASED_OUTPATIENT_CLINIC_OR_DEPARTMENT_OTHER): Payer: BC Managed Care – PPO

## 2013-05-01 VITALS — BP 127/74 | HR 67 | Temp 97.9°F

## 2013-05-01 DIAGNOSIS — C7951 Secondary malignant neoplasm of bone: Secondary | ICD-10-CM

## 2013-05-01 DIAGNOSIS — Z5111 Encounter for antineoplastic chemotherapy: Secondary | ICD-10-CM

## 2013-05-01 DIAGNOSIS — C50411 Malignant neoplasm of upper-outer quadrant of right female breast: Secondary | ICD-10-CM

## 2013-05-01 DIAGNOSIS — C7952 Secondary malignant neoplasm of bone marrow: Secondary | ICD-10-CM

## 2013-05-01 DIAGNOSIS — C50419 Malignant neoplasm of upper-outer quadrant of unspecified female breast: Secondary | ICD-10-CM

## 2013-05-01 LAB — CBC WITH DIFFERENTIAL/PLATELET
BASO%: 0.6 % (ref 0.0–2.0)
Basophils Absolute: 0 10*3/uL (ref 0.0–0.1)
EOS%: 2.8 % (ref 0.0–7.0)
Eosinophils Absolute: 0.2 10*3/uL (ref 0.0–0.5)
HCT: 38.3 % (ref 34.8–46.6)
HEMOGLOBIN: 13.3 g/dL (ref 11.6–15.9)
LYMPH#: 1.7 10*3/uL (ref 0.9–3.3)
LYMPH%: 31 % (ref 14.0–49.7)
MCH: 34.2 pg — ABNORMAL HIGH (ref 25.1–34.0)
MCHC: 34.6 g/dL (ref 31.5–36.0)
MCV: 99 fL (ref 79.5–101.0)
MONO#: 0.6 10*3/uL (ref 0.1–0.9)
MONO%: 10.4 % (ref 0.0–14.0)
NEUT#: 3 10*3/uL (ref 1.5–6.5)
NEUT%: 55.2 % (ref 38.4–76.8)
Platelets: 306 10*3/uL (ref 145–400)
RBC: 3.87 10*6/uL (ref 3.70–5.45)
RDW: 12.8 % (ref 11.2–14.5)
WBC: 5.5 10*3/uL (ref 3.9–10.3)

## 2013-05-01 LAB — COMPREHENSIVE METABOLIC PANEL (CC13)
ALBUMIN: 3.9 g/dL (ref 3.5–5.0)
ALK PHOS: 59 U/L (ref 40–150)
ALT: 20 U/L (ref 0–55)
AST: 19 U/L (ref 5–34)
Anion Gap: 11 mEq/L (ref 3–11)
BUN: 14.9 mg/dL (ref 7.0–26.0)
CALCIUM: 10.4 mg/dL (ref 8.4–10.4)
CHLORIDE: 108 meq/L (ref 98–109)
CO2: 21 mEq/L — ABNORMAL LOW (ref 22–29)
Creatinine: 0.8 mg/dL (ref 0.6–1.1)
Glucose: 100 mg/dl (ref 70–140)
POTASSIUM: 4.5 meq/L (ref 3.5–5.1)
SODIUM: 141 meq/L (ref 136–145)
Total Bilirubin: 0.26 mg/dL (ref 0.20–1.20)
Total Protein: 7.1 g/dL (ref 6.4–8.3)

## 2013-05-01 MED ORDER — DENOSUMAB 120 MG/1.7ML ~~LOC~~ SOLN
120.0000 mg | Freq: Once | SUBCUTANEOUS | Status: AC
  Administered 2013-05-01: 120 mg via SUBCUTANEOUS
  Filled 2013-05-01: qty 1.7

## 2013-05-01 MED ORDER — GOSERELIN ACETATE 3.6 MG ~~LOC~~ IMPL
3.6000 mg | DRUG_IMPLANT | SUBCUTANEOUS | Status: DC
  Administered 2013-05-01: 3.6 mg via SUBCUTANEOUS
  Filled 2013-05-01: qty 3.6

## 2013-05-03 ENCOUNTER — Encounter: Payer: Self-pay | Admitting: Obstetrics and Gynecology

## 2013-05-03 ENCOUNTER — Ambulatory Visit (INDEPENDENT_AMBULATORY_CARE_PROVIDER_SITE_OTHER): Payer: BC Managed Care – PPO | Admitting: Obstetrics and Gynecology

## 2013-05-03 VITALS — BP 138/76 | HR 76 | Ht 63.0 in | Wt 229.2 lb

## 2013-05-03 DIAGNOSIS — E7800 Pure hypercholesterolemia, unspecified: Secondary | ICD-10-CM

## 2013-05-03 DIAGNOSIS — Z01419 Encounter for gynecological examination (general) (routine) without abnormal findings: Secondary | ICD-10-CM

## 2013-05-03 DIAGNOSIS — R7309 Other abnormal glucose: Secondary | ICD-10-CM

## 2013-05-03 DIAGNOSIS — Z1211 Encounter for screening for malignant neoplasm of colon: Secondary | ICD-10-CM

## 2013-05-03 DIAGNOSIS — Z0000 Encounter for general adult medical examination without abnormal findings: Secondary | ICD-10-CM

## 2013-05-03 DIAGNOSIS — R739 Hyperglycemia, unspecified: Secondary | ICD-10-CM

## 2013-05-03 LAB — LIPID PANEL
CHOL/HDL RATIO: 4.5 ratio
Cholesterol: 184 mg/dL (ref 0–200)
HDL: 41 mg/dL (ref >39)
LDL CALC: 70 mg/dL (ref 0–99)
Triglycerides: 364 mg/dL — ABNORMAL HIGH (ref <150)
VLDL: 73 mg/dL — ABNORMAL HIGH (ref 0–40)

## 2013-05-03 LAB — POCT URINALYSIS DIPSTICK
Bilirubin, UA: NEGATIVE
Glucose, UA: NEGATIVE
KETONES UA: NEGATIVE
Leukocytes, UA: NEGATIVE
Nitrite, UA: NEGATIVE
Protein, UA: NEGATIVE
RBC UA: NEGATIVE
UROBILINOGEN UA: NEGATIVE
pH, UA: 5

## 2013-05-03 NOTE — Patient Instructions (Signed)

## 2013-05-03 NOTE — Progress Notes (Signed)
Patient ID: Cheryl Moore, female   DOB: 1961/01/22, 53 y.o.   MRN: [id number] GYNECOLOGY VISIT  PCP:   None  Referring provider:   HPI: 53 y.o.   Married  Caucasian  female   [place] with LMP 04/26/12 here for   AEX. Status post bilateral mastectomy for metastatic breast cancer - stage IV. On Arimidex. No side effects. Next scan in June 2015.   Thinks she may have genital warts now.  Has oral HSV.  Had a lot of weight gain from steroids from chemotherapy.   Wants cholesterol check and HgbA1C.  Hgb:    13.3 on 05-01-13 with Oncology Urine:   Neg  GYNECOLOGIC HISTORY: Patient's last menstrual period was 03/26/2013. Sexually active:  yes Partner preference: female Contraception:   none Menopausal hormone therapy:  no DES exposure:  no  Blood transfusions:   no Sexually transmitted diseases:  HSV  GYN procedures and prior surgeries:  no Last mammogram: Bilateral mastectomy 10/2012 due breast cancer in left breast--metastatic to bones and lymph nodes.                Last pap and high risk HPV testing:  05-02-12 wnl:neg HR HPV History of abnormal pap smear: no    OB History   Grav Para Term Preterm Abortions TAB SAB Ect Mult Living   1 1 1       1        LIFESTYLE: Exercise:  no              Tobacco:  no Alcohol:     no Drug use:  no  OTHER HEALTH MAINTENANCE: Tetanus/TDap:  05-02-12 Gardisil:              n/a Influenza:            01-05-13 Zostavax:             n/a  Bone density:      n/a Colonoscopy:      never  Cholesterol check:   03/2012 slightly elevated  Family History  Problem Relation Age of Onset  . Adopted: Yes    Patient Active Problem List   Diagnosis Date Noted  . Bilateral lower extremity edema 10/18/2012  . Dyspnea 09/21/2012  . Cancer of upper-outer quadrant of female breast 05/18/2012  . Breast mass, right 05/02/2012  . Obesity, unspecified 05/02/2012   Past Medical History  Diagnosis Date  . Breast cancer   . Anxiety   . Hot flashes    . History of radiation therapy 11/28/12-03/06/13    right breast/64.4Gy/ right hip=37.5Gy     Past Surgical History  Procedure Laterality Date  . Cesarean section  2005  . Breast surgery Right     breast bx  . Breast biopsy Right 05/23/2012    Procedure: SKIN PUNCH BIOPSY RIGHT BREAST;  Surgeon: [name], MD;  Location: [place];  Service: General;  Laterality: Right;  . Portacath placement Left 05/23/2012    Procedure: INSERTION PORT-A-CATH;  Surgeon: [name], MD;  Location: [place];  Service: General;  Laterality: Left;  . Total mastectomy Left 10/26/2012    Dr Donne Hazel  . Simple mastectomy with axillary sentinel node biopsy Left 10/26/2012    Procedure: TOTAL MASTECTOMY;  Surgeon: [name], MD;  Location: [place];  Service: General;  Laterality: Left;  Marland Kitchen Mastectomy modified radical Right 10/26/2012    Procedure: MASTECTOMY MODIFIED RADICAL;  Surgeon: [name], MD;  Location: [place];  Service: General;  Laterality:  Right;    ALLERGIES: Penicillins  Current Outpatient Prescriptions  Medication Sig Dispense Refill  . ALPRAZolam (XANAX) 0.25 MG tablet Take 1 tablet (0.25 mg total) by mouth at bedtime as needed for anxiety.  30 tablet  1  . Ascorbic Acid (VITAMIN C PO) Take 1 tablet by mouth daily.      Marland Kitchen CALCIUM PO Take 1 tablet by mouth daily.      Marland Kitchen denosumab (XGEVA) 120 MG/1.7ML SOLN injection Inject 120 mg into the skin once.      Marland Kitchen exemestane (AROMASIN) 25 MG tablet Take 1 tablet (25 mg total) by mouth daily after breakfast.  30 tablet  3  . gabapentin (NEURONTIN) 100 MG capsule TAKE ONE CAPSULE 3 TIMES A DAY  90 capsule  0  . goserelin (ZOLADEX) 10.8 MG injection Inject 10.8 mg into the skin once.      Marland Kitchen ibuprofen (ADVIL,MOTRIN) 200 MG tablet Take 200-400 mg by mouth every 8 (eight) hours as needed for pain.       Marland Kitchen LORazepam (ATIVAN) 0.5 MG tablet TAKE 1 TABLET BY MOUTH EVERY 8 HOURS AS NEEDED FOR ANXIETY  30 tablet  0  . Multiple Vitamin  (MULTIVITAMIN) tablet Take 1 tablet by mouth daily.      . non-metallic deodorant Jethro Poling) MISC Apply 1 application topically daily.      . pantoprazole (PROTONIX) 40 MG tablet TAKE 1 TABLET (40 MG TOTAL) BY MOUTH DAILY.  30 tablet  3  . prochlorperazine (COMPAZINE) 10 MG tablet       . traMADol (ULTRAM) 50 MG tablet Take 1 tablet (50 mg total) by mouth every 6 (six) hours as needed.  30 tablet  2  . UNABLE TO FIND Rx: L8015-Post Mastectomy Camisole (Quantity: 2) L8000- Post Surgical Bras (Quantity: 6) E7035- Non-Silicone Breast Prosthesis (Quantity: 2) K0938- Silicone Breast Prosthesis (Quantity: 2) Dx: 174.9; Bilateral mastectomy  1 each  0  . valACYclovir (VALTREX) 500 MG tablet Take 1 tablet (500 mg total) by mouth daily.  30 tablet  5   No current facility-administered medications for this visit.     ROS:  Pertinent items are noted in HPI.  SOCIAL HISTORY:  Married.  Homemaker.  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   PHYSICAL EXAMINATION:    BP 138/76  Pulse 76  Ht 5\' 3"  (1.6 m)  Wt 229 lb 3.2 oz (103.964 kg)  BMI 40.61 kg/m2  LMP 03/26/2013   Wt Readings from Last 3 Encounters:  05/03/13 229 lb 3.2 oz (103.964 kg)  04/13/13 225 lb 12.8 oz (102.422 kg)  04/03/13 223 lb 11.2 oz (101.47 kg)     Ht Readings from Last 3 Encounters:  05/03/13 5\' 3"  (1.6 m)  04/03/13 5\' 3"  (1.6 m)  02/21/13 5\' 3"  (1.6 m)    General appearance: alert, cooperative and appears stated age Head: Normocephalic, without obvious abnormality, atraumatic Neck: no adenopathy, supple, symmetrical, trachea midline and thyroid not enlarged, symmetric, no tenderness/mass/nodules Lungs: Port a Cath palpable on left anterior chest wall.  Lungs clear to auscultation bilaterally Breasts: Absent bilaterally.  Scaring from bilateral mastectomy.  Skin changes on right from radiation.  Heart: regular rate and rhythm Abdomen: soft, non-tender; no masses,  no organomegaly Extremities: extremities normal,  atraumatic, no cyanosis or edema Skin: Skin color, texture, turgor normal. No rashes or lesions Lymph nodes: Cervical, supraclavicular, and axillary nodes normal. No abnormal inguinal nodes palpated Neurologic: Grossly normal  Pelvic: External genitalia:  no lesions.  Left labia  minora with 3 mm inclusion cyst.               Urethra:  normal appearing urethra with no masses, tenderness or lesions              Bartholins and Skenes: normal                 Vagina: normal appearing vagina with normal color and discharge, no lesions              Cervix: normal appearance              Pap and high risk HPV testing done: yes.            Bimanual Exam:  Uterus:  uterus is normal size, shape, consistency and nontender                                      Adnexa: normal adnexa in size, nontender and no masses                                      Rectovaginal: Confirms                                      Anus:  normal sphincter tone, no lesions  ASSESSMENT  Status post bilateral mastectomy.  Stage IV breast cancer.  Normal pelvic examination with no sign of condyloma.  Hyperlipidemia and elevated glucose. No prior colonoscopy.   PLAN  Mammogram recommended yearly.  Pap smear and high risk HPV testing performed today. Lipid profile and HgbA1C. Referral to Dr. Carlean Purl for colonoscopy consultation.  Counseled on self breast exam, Calcium and vitamin D intake, exercise. Return annually or prn   An After Visit Summary was printed and given to the patient.

## 2013-05-04 LAB — HEMOGLOBIN A1C
Hgb A1c MFr Bld: 5.5 % (ref <5.7)
Mean Plasma Glucose: 111 mg/dL (ref <117)

## 2013-05-05 LAB — IPS PAP TEST WITH HPV

## 2013-05-08 ENCOUNTER — Other Ambulatory Visit: Payer: Self-pay | Admitting: Adult Health

## 2013-05-08 ENCOUNTER — Other Ambulatory Visit: Payer: Self-pay | Admitting: Oncology

## 2013-05-08 DIAGNOSIS — C50419 Malignant neoplasm of upper-outer quadrant of unspecified female breast: Secondary | ICD-10-CM

## 2013-05-11 ENCOUNTER — Encounter (HOSPITAL_COMMUNITY): Payer: Self-pay | Admitting: Cardiology

## 2013-05-29 ENCOUNTER — Ambulatory Visit (HOSPITAL_BASED_OUTPATIENT_CLINIC_OR_DEPARTMENT_OTHER): Payer: BC Managed Care – PPO

## 2013-05-29 ENCOUNTER — Other Ambulatory Visit (HOSPITAL_BASED_OUTPATIENT_CLINIC_OR_DEPARTMENT_OTHER): Payer: BC Managed Care – PPO

## 2013-05-29 DIAGNOSIS — C7952 Secondary malignant neoplasm of bone marrow: Secondary | ICD-10-CM

## 2013-05-29 DIAGNOSIS — C50419 Malignant neoplasm of upper-outer quadrant of unspecified female breast: Secondary | ICD-10-CM

## 2013-05-29 DIAGNOSIS — Z5111 Encounter for antineoplastic chemotherapy: Secondary | ICD-10-CM

## 2013-05-29 DIAGNOSIS — C7951 Secondary malignant neoplasm of bone: Secondary | ICD-10-CM

## 2013-05-29 DIAGNOSIS — C50411 Malignant neoplasm of upper-outer quadrant of right female breast: Secondary | ICD-10-CM

## 2013-05-29 LAB — CBC WITH DIFFERENTIAL/PLATELET
BASO%: 0.7 % (ref 0.0–2.0)
Basophils Absolute: 0 10*3/uL (ref 0.0–0.1)
EOS%: 3.1 % (ref 0.0–7.0)
Eosinophils Absolute: 0.2 10*3/uL (ref 0.0–0.5)
HCT: 40.7 % (ref 34.8–46.6)
HGB: 14 g/dL (ref 11.6–15.9)
LYMPH%: 27.2 % (ref 14.0–49.7)
MCH: 33.1 pg (ref 25.1–34.0)
MCHC: 34.3 g/dL (ref 31.5–36.0)
MCV: 96.5 fL (ref 79.5–101.0)
MONO#: 0.6 10*3/uL (ref 0.1–0.9)
MONO%: 9.8 % (ref 0.0–14.0)
NEUT#: 3.7 10*3/uL (ref 1.5–6.5)
NEUT%: 59.2 % (ref 38.4–76.8)
Platelets: 302 10*3/uL (ref 145–400)
RBC: 4.21 10*6/uL (ref 3.70–5.45)
RDW: 12.8 % (ref 11.2–14.5)
WBC: 6.3 10*3/uL (ref 3.9–10.3)
lymph#: 1.7 10*3/uL (ref 0.9–3.3)

## 2013-05-29 LAB — COMPREHENSIVE METABOLIC PANEL (CC13)
ALT: 34 U/L (ref 0–55)
AST: 21 U/L (ref 5–34)
Albumin: 3.8 g/dL (ref 3.5–5.0)
Alkaline Phosphatase: 65 U/L (ref 40–150)
Anion Gap: 9 mEq/L (ref 3–11)
BUN: 15.8 mg/dL (ref 7.0–26.0)
CALCIUM: 9.7 mg/dL (ref 8.4–10.4)
CO2: 21 mEq/L — ABNORMAL LOW (ref 22–29)
CREATININE: 0.8 mg/dL (ref 0.6–1.1)
Chloride: 110 mEq/L — ABNORMAL HIGH (ref 98–109)
Glucose: 109 mg/dl (ref 70–140)
Potassium: 4 mEq/L (ref 3.5–5.1)
SODIUM: 140 meq/L (ref 136–145)
TOTAL PROTEIN: 7 g/dL (ref 6.4–8.3)
Total Bilirubin: 0.27 mg/dL (ref 0.20–1.20)

## 2013-05-29 MED ORDER — DENOSUMAB 120 MG/1.7ML ~~LOC~~ SOLN
120.0000 mg | Freq: Once | SUBCUTANEOUS | Status: AC
  Administered 2013-05-29: 120 mg via SUBCUTANEOUS
  Filled 2013-05-29: qty 1.7

## 2013-05-29 MED ORDER — SODIUM CHLORIDE 0.9 % IJ SOLN
10.0000 mL | INTRAMUSCULAR | Status: DC | PRN
  Administered 2013-05-29: 10 mL via INTRAVENOUS
  Filled 2013-05-29: qty 10

## 2013-05-29 MED ORDER — HEPARIN SOD (PORK) LOCK FLUSH 100 UNIT/ML IV SOLN
500.0000 [IU] | Freq: Once | INTRAVENOUS | Status: AC
  Administered 2013-05-29: 500 [IU] via INTRAVENOUS
  Filled 2013-05-29: qty 5

## 2013-05-29 MED ORDER — GOSERELIN ACETATE 3.6 MG ~~LOC~~ IMPL
3.6000 mg | DRUG_IMPLANT | SUBCUTANEOUS | Status: DC
  Administered 2013-05-29: 3.6 mg via SUBCUTANEOUS
  Filled 2013-05-29: qty 3.6

## 2013-05-29 NOTE — Patient Instructions (Signed)
Denosumab injection What is this medicine? DENOSUMAB (den oh sue mab) slows bone breakdown. Prolia is used to treat osteoporosis in women after menopause and in men. Xgeva is used to prevent bone fractures and other bone problems caused by cancer bone metastases. Xgeva is also used to treat giant cell tumor of the bone. This medicine may be used for other purposes; ask your health care provider or pharmacist if you have questions. COMMON BRAND NAME(S): Prolia, XGEVA What should I tell my health care provider before I take this medicine? They need to know if you have any of these conditions: -dental disease -eczema -infection or history of infections -kidney disease or on dialysis -low blood calcium or vitamin D -malabsorption syndrome -scheduled to have surgery or tooth extraction -taking medicine that contains denosumab -thyroid or parathyroid disease -an unusual reaction to denosumab, other medicines, foods, dyes, or preservatives -pregnant or trying to get pregnant -breast-feeding How should I use this medicine? This medicine is for injection under the skin. It is given by a health care professional in a hospital or clinic setting. If you are getting Prolia, a special MedGuide will be given to you by the pharmacist with each prescription and refill. Be sure to read this information carefully each time. For Prolia, talk to your pediatrician regarding the use of this medicine in children. Special care may be needed. For Xgeva, talk to your pediatrician regarding the use of this medicine in children. While this drug may be prescribed for children as young as 13 years for selected conditions, precautions do apply. Overdosage: If you think you've taken too much of this medicine contact a poison control center or emergency room at once. Overdosage: If you think you have taken too much of this medicine contact a poison control center or emergency room at once. NOTE: This medicine is only for  you. Do not share this medicine with others. What if I miss a dose? It is important not to miss your dose. Call your doctor or health care professional if you are unable to keep an appointment. What may interact with this medicine? Do not take this medicine with any of the following medications: -other medicines containing denosumab This medicine may also interact with the following medications: -medicines that suppress the immune system -medicines that treat cancer -steroid medicines like prednisone or cortisone This list may not describe all possible interactions. Give your health care provider a list of all the medicines, herbs, non-prescription drugs, or dietary supplements you use. Also tell them if you smoke, drink alcohol, or use illegal drugs. Some items may interact with your medicine. What should I watch for while using this medicine? Visit your doctor or health care professional for regular checks on your progress. Your doctor or health care professional may order blood tests and other tests to see how you are doing. Call your doctor or health care professional if you get a cold or other infection while receiving this medicine. Do not treat yourself. This medicine may decrease your body's ability to fight infection. You should make sure you get enough calcium and vitamin D while you are taking this medicine, unless your doctor tells you not to. Discuss the foods you eat and the vitamins you take with your health care professional. See your dentist regularly. Brush and floss your teeth as directed. Before you have any dental work done, tell your dentist you are receiving this medicine. Do not become pregnant while taking this medicine or for 5 months after stopping   it. Women should inform their doctor if they wish to become pregnant or think they might be pregnant. There is a potential for serious side effects to an unborn child. Talk to your health care professional or pharmacist for more  information. What side effects may I notice from receiving this medicine? Side effects that you should report to your doctor or health care professional as soon as possible: -allergic reactions like skin rash, itching or hives, swelling of the face, lips, or tongue -breathing problems -chest pain -fast, irregular heartbeat -feeling faint or lightheaded, falls -fever, chills, or any other sign of infection -muscle spasms, tightening, or twitches -numbness or tingling -skin blisters or bumps, or is dry, peels, or red -slow healing or unexplained pain in the mouth or jaw -unusual bleeding or bruising Side effects that usually do not require medical attention (Report these to your doctor or health care professional if they continue or are bothersome.): -muscle pain -stomach upset, gas This list may not describe all possible side effects. Call your doctor for medical advice about side effects. You may report side effects to FDA at 1-800-FDA-1088. Where should I keep my medicine? This medicine is only given in a clinic, doctor's office, or other health care setting and will not be stored at home. NOTE: This sheet is a summary. It may not cover all possible information. If you have questions about this medicine, talk to your doctor, pharmacist, or health care provider.  2014, Elsevier/Gold Standard. (2011-07-13 12:37:47)   Goserelin injection What is this medicine? GOSERELIN (GOE se rel in) is similar to a hormone found in the body. It lowers the amount of sex hormones that the body makes. Men will have lower testosterone levels and women will have lower estrogen levels while taking this medicine. In men, this medicine is used to treat prostate cancer; the injection is either given once per month or once every 12 weeks. A once per month injection (only) is used to treat women with endometriosis, dysfunctional uterine bleeding, or advanced breast cancer. This medicine may be used for other  purposes; ask your health care provider or pharmacist if you have questions. COMMON BRAND NAME(S): Zoladex What should I tell my health care provider before I take this medicine? They need to know if you have any of these conditions (some only apply to women): -diabetes -heart disease or previous heart attack -high blood pressure -high cholesterol -kidney disease -osteoporosis or low bone density -problems passing urine -spinal cord injury -stroke -tobacco smoker -an unusual or allergic reaction to goserelin, hormone therapy, other medicines, foods, dyes, or preservatives -pregnant or trying to get pregnant -breast-feeding How should I use this medicine? This medicine is for injection under the skin. It is given by a health care professional in a hospital or clinic setting. Men receive this injection once every 4 weeks or once every 12 weeks. Women will only receive the once every 4 weeks injection. Talk to your pediatrician regarding the use of this medicine in children. Special care may be needed. Overdosage: If you think you have taken too much of this medicine contact a poison control center or emergency room at once. NOTE: This medicine is only for you. Do not share this medicine with others. What if I miss a dose? It is important not to miss your dose. Call your doctor or health care professional if you are unable to keep an appointment. What may interact with this medicine? -female hormones like estrogen -herbal or dietary supplements like black  cohosh, chasteberry, or DHEA -female hormones like testosterone -prasterone This list may not describe all possible interactions. Give your health care provider a list of all the medicines, herbs, non-prescription drugs, or dietary supplements you use. Also tell them if you smoke, drink alcohol, or use illegal drugs. Some items may interact with your medicine. What should I watch for while using this medicine? Visit your doctor or health  care professional for regular checks on your progress. Your symptoms may appear to get worse during the first weeks of this therapy. Tell your doctor or healthcare professional if your symptoms do not start to get better or if they get worse after this time. Your bones may get weaker if you take this medicine for a long time. If you smoke or frequently drink alcohol you may increase your risk of bone loss. A family history of osteoporosis, chronic use of drugs for seizures (convulsions), or corticosteroids can also increase your risk of bone loss. Talk to your doctor about how to keep your bones strong. This medicine should stop regular monthly menstration in women. Tell your doctor if you continue to [facility]. Women should not become pregnant while taking this medicine or for 12 weeks after stopping this medicine. Women should inform their doctor if they wish to become pregnant or think they might be pregnant. There is a potential for serious side effects to an unborn child. Talk to your health care professional or pharmacist for more information. Do not breast-feed an infant while taking this medicine. Men should inform their doctors if they wish to father a child. This medicine may lower sperm counts. Talk to your health care professional or pharmacist for more information. What side effects may I notice from receiving this medicine? Side effects that you should report to your doctor or health care professional as soon as possible: -allergic reactions like skin rash, itching or hives, swelling of the face, lips, or tongue -bone pain -breathing problems -changes in vision -chest pain -feeling faint or lightheaded, falls -fever, chills -pain, swelling, warmth in the leg -pain, tingling, numbness in the hands or feet -swelling of the ankles, feet, hands -trouble passing urine or change in the amount of urine -unusually high or low blood pressure -unusually weak or tired Side effects that usually  do not require medical attention (report to your doctor or health care professional if they continue or are bothersome): -change in sex drive or performance -changes in breast size in both males and females -changes in emotions or moods -headache -hot flashes -irritation at site where injected -loss of appetite -skin problems like acne, dry skin -vaginal dryness This list may not describe all possible side effects. Call your doctor for medical advice about side effects. You may report side effects to FDA at 1-800-FDA-1088. Where should I keep my medicine? This drug is given in a hospital or clinic and will not be stored at home. NOTE: This sheet is a summary. It may not cover all possible information. If you have questions about this medicine, talk to your doctor, pharmacist, or health care provider.  2014, Elsevier/Gold Standard. (2008-05-29 13:28:29)   Implanted [facility] Guide An implanted port is a type of central line that is placed under the skin. Central lines are used to provide IV access when treatment or nutrition needs to be given through a person's veins. Implanted ports are used for long-term IV access. An implanted port may be placed because:   You need IV medicine that would be irritating  to the small veins in your hands or arms.   You need long-term IV medicines, such as antibiotics.   You need IV nutrition for a long period.   You need frequent blood draws for lab tests.   You need dialysis.  Implanted ports are usually placed in the chest area, but they can also be placed in the upper arm, the abdomen, or the leg. An implanted port has two main parts:   Reservoir. The reservoir is round and will appear as a small, raised area under your skin. The reservoir is the part where a needle is inserted to give medicines or draw blood.   Catheter. The catheter is a thin, flexible tube that extends from the reservoir. The catheter is placed into a large vein. Medicine  that is inserted into the reservoir goes into the catheter and then into the vein.  HOW WILL I CARE FOR MY INCISION SITE? Do not get the incision site wet. Bathe or shower as directed by your health care provider.  HOW IS MY PORT ACCESSED? Special steps must be taken to access the port:   Before the port is accessed, a numbing cream can be placed on the skin. This helps numb the skin over the port site.   Your health care provider uses a sterile technique to access the port.  Your health care provider must put on a mask and sterile gloves.  The skin over your port is cleaned carefully with an antiseptic and allowed to dry.  The port is gently pinched between sterile gloves, and a needle is inserted into the port.  Only "non-coring" port needles should be used to access the port. Once the port is accessed, a blood return should be checked. This helps ensure that the port is in the vein and is not clogged.   If your port needs to remain accessed for a constant infusion, a clear (transparent) bandage will be placed over the needle site. The bandage and needle will need to be changed every week, or as directed by your health care provider.   Keep the bandage covering the needle clean and dry. Do not get it wet. Follow your health care provider's instructions on how to take a shower or bath while the port is accessed.   If your port does not need to stay accessed, no bandage is needed over the port.  WHAT IS FLUSHING? Flushing helps keep the port from getting clogged. Follow your health care provider's instructions on how and when to flush the port. Ports are usually flushed with saline solution or a medicine called heparin. The need for flushing will depend on how the port is used.   If the port is used for intermittent medicines or blood draws, the port will need to be flushed:   After medicines have been given.   After blood has been drawn.   As part of routine maintenance.    If a constant infusion is running, the port may not need to be flushed.  HOW LONG WILL MY PORT STAY IMPLANTED? The port can stay in for as long as your health care provider thinks it is needed. When it is time for the port to come out, surgery will be done to remove it. The procedure is similar to the one performed when the port was put in.  WHEN SHOULD I SEEK IMMEDIATE MEDICAL CARE? When you have an implanted port, you should seek immediate medical care if:   You notice a bad  smell coming from the incision site.   You have swelling, redness, or drainage at the incision site.   You have more swelling or pain at the port site or the surrounding area.   You have a fever that is not controlled with medicine. Document Released: 01/12/2005 Document Revised: 11/02/2012 Document Reviewed: 09/19/2012 [facility] Patient Information 2014 [place].

## 2013-06-12 ENCOUNTER — Ambulatory Visit (HOSPITAL_COMMUNITY)
Admission: RE | Admit: 2013-06-12 | Discharge: 2013-06-12 | Disposition: A | Discharge status: Home / Self Care | Payer: BC Managed Care – PPO | Source: Ambulatory Visit | Attending: Adult Health | Admitting: Adult Health

## 2013-06-12 ENCOUNTER — Encounter (HOSPITAL_COMMUNITY): Payer: Self-pay

## 2013-06-12 ENCOUNTER — Other Ambulatory Visit (HOSPITAL_COMMUNITY): Payer: Self-pay | Admitting: Cardiology

## 2013-06-12 ENCOUNTER — Ambulatory Visit (HOSPITAL_BASED_OUTPATIENT_CLINIC_OR_DEPARTMENT_OTHER)
Admission: RE | Admit: 2013-06-12 | Discharge: 2013-06-12 | Disposition: A | Discharge status: Home / Self Care | Payer: BC Managed Care – PPO | Source: Ambulatory Visit | Attending: Internal Medicine | Admitting: Internal Medicine

## 2013-06-12 VITALS — BP 120/80 | HR 80 | Wt 232.4 lb

## 2013-06-12 DIAGNOSIS — C50419 Malignant neoplasm of upper-outer quadrant of unspecified female breast: Secondary | ICD-10-CM

## 2013-06-12 DIAGNOSIS — R609 Edema, unspecified: Secondary | ICD-10-CM

## 2013-06-12 DIAGNOSIS — C50919 Malignant neoplasm of unspecified site of unspecified female breast: Secondary | ICD-10-CM

## 2013-06-12 DIAGNOSIS — I517 Cardiomegaly: Secondary | ICD-10-CM

## 2013-06-12 DIAGNOSIS — R0600 Dyspnea, unspecified: Secondary | ICD-10-CM

## 2013-06-12 DIAGNOSIS — R0609 Other forms of dyspnea: Secondary | ICD-10-CM

## 2013-06-12 DIAGNOSIS — R0989 Other specified symptoms and signs involving the circulatory and respiratory systems: Secondary | ICD-10-CM

## 2013-06-12 DIAGNOSIS — R600 Localized edema: Secondary | ICD-10-CM

## 2013-06-12 NOTE — Progress Notes (Signed)
  Echocardiogram 2D Echocardiogram has been performed.  Carney Corners 06/12/2013, 3:08 PM

## 2013-06-12 NOTE — Patient Instructions (Signed)
Follow up as needed
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
## 2013-06-27 ENCOUNTER — Encounter (HOSPITAL_COMMUNITY)
Admission: RE | Admit: 2013-06-27 | Discharge: 2013-06-27 | Disposition: A | Discharge status: Home / Self Care | Payer: BC Managed Care – PPO | Source: Ambulatory Visit | Attending: Adult Health | Admitting: Adult Health

## 2013-06-27 ENCOUNTER — Encounter (HOSPITAL_COMMUNITY): Payer: Self-pay

## 2013-06-27 ENCOUNTER — Ambulatory Visit (HOSPITAL_COMMUNITY)
Admission: RE | Admit: 2013-06-27 | Discharge: 2013-06-27 | Disposition: A | Discharge status: Home / Self Care | Payer: BC Managed Care – PPO | Source: Ambulatory Visit | Attending: Adult Health | Admitting: Adult Health

## 2013-06-27 DIAGNOSIS — J984 Other disorders of lung: Secondary | ICD-10-CM | POA: Insufficient documentation

## 2013-06-27 DIAGNOSIS — R911 Solitary pulmonary nodule: Secondary | ICD-10-CM | POA: Insufficient documentation

## 2013-06-27 DIAGNOSIS — C50419 Malignant neoplasm of upper-outer quadrant of unspecified female breast: Secondary | ICD-10-CM

## 2013-06-27 DIAGNOSIS — M899 Disorder of bone, unspecified: Secondary | ICD-10-CM | POA: Insufficient documentation

## 2013-06-27 DIAGNOSIS — Z9010 Acquired absence of unspecified breast and nipple: Secondary | ICD-10-CM | POA: Insufficient documentation

## 2013-06-27 DIAGNOSIS — M949 Disorder of cartilage, unspecified: Secondary | ICD-10-CM

## 2013-06-27 DIAGNOSIS — C50919 Malignant neoplasm of unspecified site of unspecified female breast: Secondary | ICD-10-CM | POA: Insufficient documentation

## 2013-06-27 LAB — GLUCOSE, CAPILLARY: Glucose-Capillary: 94 mg/dL (ref 70–99)

## 2013-06-27 MED ORDER — FLUDEOXYGLUCOSE F - 18 (FDG) INJECTION
12.3000 | Freq: Once | INTRAVENOUS | Status: AC | PRN
  Administered 2013-06-27: 12.3 via INTRAVENOUS

## 2013-06-27 MED ORDER — IOHEXOL 300 MG/ML  SOLN
100.0000 mL | Freq: Once | INTRAMUSCULAR | Status: AC | PRN
  Administered 2013-06-27: 100 mL via INTRAVENOUS

## 2013-06-29 ENCOUNTER — Encounter: Payer: Self-pay | Admitting: Oncology

## 2013-06-29 NOTE — Progress Notes (Signed)
Insurance is paying [id number]%

## 2013-06-30 ENCOUNTER — Other Ambulatory Visit (HOSPITAL_BASED_OUTPATIENT_CLINIC_OR_DEPARTMENT_OTHER): Payer: BC Managed Care – PPO

## 2013-06-30 ENCOUNTER — Ambulatory Visit (HOSPITAL_BASED_OUTPATIENT_CLINIC_OR_DEPARTMENT_OTHER): Payer: BC Managed Care – PPO

## 2013-06-30 ENCOUNTER — Telehealth: Payer: Self-pay | Admitting: *Deleted

## 2013-06-30 ENCOUNTER — Ambulatory Visit (HOSPITAL_BASED_OUTPATIENT_CLINIC_OR_DEPARTMENT_OTHER): Payer: BC Managed Care – PPO | Admitting: Adult Health

## 2013-06-30 ENCOUNTER — Encounter: Payer: Self-pay | Admitting: Adult Health

## 2013-06-30 VITALS — BP 132/73 | HR 80 | Temp 97.8°F | Resp 18 | Ht 63.0 in | Wt 235.6 lb

## 2013-06-30 DIAGNOSIS — G629 Polyneuropathy, unspecified: Secondary | ICD-10-CM

## 2013-06-30 DIAGNOSIS — C50919 Malignant neoplasm of unspecified site of unspecified female breast: Secondary | ICD-10-CM

## 2013-06-30 DIAGNOSIS — C50419 Malignant neoplasm of upper-outer quadrant of unspecified female breast: Secondary | ICD-10-CM

## 2013-06-30 DIAGNOSIS — C7951 Secondary malignant neoplasm of bone: Secondary | ICD-10-CM

## 2013-06-30 DIAGNOSIS — M25559 Pain in unspecified hip: Secondary | ICD-10-CM

## 2013-06-30 DIAGNOSIS — C50411 Malignant neoplasm of upper-outer quadrant of right female breast: Secondary | ICD-10-CM

## 2013-06-30 DIAGNOSIS — Z170 Estrogen receptor positive status [ER+]: Secondary | ICD-10-CM

## 2013-06-30 DIAGNOSIS — C7952 Secondary malignant neoplasm of bone marrow: Secondary | ICD-10-CM

## 2013-06-30 DIAGNOSIS — Z5111 Encounter for antineoplastic chemotherapy: Secondary | ICD-10-CM

## 2013-06-30 DIAGNOSIS — C773 Secondary and unspecified malignant neoplasm of axilla and upper limb lymph nodes: Secondary | ICD-10-CM

## 2013-06-30 LAB — CBC WITH DIFFERENTIAL/PLATELET
BASO%: 1.1 % (ref 0.0–2.0)
BASOS ABS: 0.1 10*3/uL (ref 0.0–0.1)
EOS%: 2 % (ref 0.0–7.0)
Eosinophils Absolute: 0.1 10*3/uL (ref 0.0–0.5)
HCT: 40.1 % (ref 34.8–46.6)
HGB: 13.8 g/dL (ref 11.6–15.9)
LYMPH%: 23.8 % (ref 14.0–49.7)
MCH: 32.2 pg (ref 25.1–34.0)
MCHC: 34.4 g/dL (ref 31.5–36.0)
MCV: 93.6 fL (ref 79.5–101.0)
MONO#: 0.6 10*3/uL (ref 0.1–0.9)
MONO%: 10.2 % (ref 0.0–14.0)
NEUT#: 3.8 10*3/uL (ref 1.5–6.5)
NEUT%: 62.9 % (ref 38.4–76.8)
PLATELETS: 285 10*3/uL (ref 145–400)
RBC: 4.29 10*6/uL (ref 3.70–5.45)
RDW: 12.8 % (ref 11.2–14.5)
WBC: 6.1 10*3/uL (ref 3.9–10.3)
lymph#: 1.4 10*3/uL (ref 0.9–3.3)

## 2013-06-30 LAB — COMPREHENSIVE METABOLIC PANEL (CC13)
ALK PHOS: 65 U/L (ref 40–150)
ALT: 39 U/L (ref 0–55)
AST: 26 U/L (ref 5–34)
Albumin: 3.6 g/dL (ref 3.5–5.0)
Anion Gap: 13 mEq/L — ABNORMAL HIGH (ref 3–11)
BUN: 14.2 mg/dL (ref 7.0–26.0)
CO2: 18 mEq/L — ABNORMAL LOW (ref 22–29)
Calcium: 9.3 mg/dL (ref 8.4–10.4)
Chloride: 109 mEq/L (ref 98–109)
Creatinine: 0.9 mg/dL (ref 0.6–1.1)
Glucose: 106 mg/dl (ref 70–140)
Potassium: 4.2 mEq/L (ref 3.5–5.1)
SODIUM: 140 meq/L (ref 136–145)
Total Bilirubin: 0.26 mg/dL (ref 0.20–1.20)
Total Protein: 6.8 g/dL (ref 6.4–8.3)

## 2013-06-30 MED ORDER — GOSERELIN ACETATE 3.6 MG ~~LOC~~ IMPL
3.6000 mg | DRUG_IMPLANT | SUBCUTANEOUS | Status: DC
  Administered 2013-06-30: 3.6 mg via SUBCUTANEOUS
  Filled 2013-06-30: qty 3.6

## 2013-06-30 MED ORDER — LORAZEPAM 0.5 MG PO TABS: 0.5000 mg | ORAL_TABLET | Freq: Three times a day (TID) | ORAL | Status: DC | PRN

## 2013-06-30 MED ORDER — PALBOCICLIB 125 MG PO CAPS: 125.0000 mg | ORAL_CAPSULE | Freq: Every day | ORAL | Status: DC

## 2013-06-30 MED ORDER — DENOSUMAB 120 MG/1.7ML ~~LOC~~ SOLN
120.0000 mg | Freq: Once | SUBCUTANEOUS | Status: AC
  Administered 2013-06-30: 120 mg via SUBCUTANEOUS
  Filled 2013-06-30: qty 1.7

## 2013-06-30 MED ORDER — ALPRAZOLAM 0.25 MG PO TABS: 0.2500 mg | ORAL_TABLET | Freq: Every evening | ORAL | Status: DC | PRN

## 2013-06-30 NOTE — Telephone Encounter (Signed)
Cheryl Moore Rx faxed to [facility]. Patient currently on Exemestine, which insurance may require [place] to change to Aromasin. Will await to hear from pharmacy.
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
## 2013-07-01 ENCOUNTER — Ambulatory Visit (HOSPITAL_COMMUNITY)
Admission: RE | Admit: 2013-07-01 | Discharge: 2013-07-01 | Disposition: A | Discharge status: Home / Self Care | Payer: BC Managed Care – PPO | Source: Ambulatory Visit | Attending: Adult Health | Admitting: Adult Health

## 2013-07-01 DIAGNOSIS — G629 Polyneuropathy, unspecified: Secondary | ICD-10-CM

## 2013-07-01 DIAGNOSIS — M25559 Pain in unspecified hip: Secondary | ICD-10-CM | POA: Insufficient documentation

## 2013-07-01 DIAGNOSIS — C50919 Malignant neoplasm of unspecified site of unspecified female breast: Secondary | ICD-10-CM | POA: Insufficient documentation

## 2013-07-01 DIAGNOSIS — C801 Malignant (primary) neoplasm, unspecified: Secondary | ICD-10-CM | POA: Insufficient documentation

## 2013-07-03 ENCOUNTER — Telehealth: Payer: Self-pay | Admitting: *Deleted

## 2013-07-03 ENCOUNTER — Telehealth: Payer: Self-pay | Admitting: Oncology

## 2013-07-03 NOTE — Telephone Encounter (Signed)
Called pt to inform her of X-Ray results pertaining to the Right hip. Communicated with pt that X-ray showed no fracture or dislocation and no bone lesions. Pt still complaining of pain being worse at night and after physical activity. Pt wanted to know if any other testing can be done. I will forward this concern to Charlestine Massed, NP.

## 2013-07-03 NOTE — Telephone Encounter (Signed)
added appts for june thru sept to start 6/12. lmonvm for pt and gv next appt for 6/12. schedule mailed and pt informed she may also check mychart for appts. [facility] will call pt re mri's/xrays. appts scheduled per 6/5 and 6/7 pof's.

## 2013-07-04 ENCOUNTER — Encounter: Payer: Self-pay | Admitting: Oncology

## 2013-07-04 ENCOUNTER — Encounter: Payer: Self-pay | Admitting: Adult Health

## 2013-07-04 ENCOUNTER — Other Ambulatory Visit: Payer: Self-pay | Admitting: *Deleted

## 2013-07-04 DIAGNOSIS — C50419 Malignant neoplasm of upper-outer quadrant of unspecified female breast: Secondary | ICD-10-CM

## 2013-07-04 MED ORDER — LETROZOLE 2.5 MG PO TABS: 2.5000 mg | ORAL_TABLET | Freq: Every day | ORAL | Status: DC

## 2013-07-04 MED ORDER — PALBOCICLIB 125 MG PO CAPS: 125.0000 mg | ORAL_CAPSULE | Freq: Every day | ORAL | Status: DC

## 2013-07-04 NOTE — Progress Notes (Signed)
Faxed ibrance prescription to [facility]

## 2013-07-04 NOTE — Telephone Encounter (Signed)
[place] [place] for clarication of patient's request to send order to [email or web address].  [name] does not require a prior authorization.  It is not allowed to be filled through any pharmacy except a specialty pharmacy.  New script provided to [facility].   Verbal order received from [facility] to order letrozole as indicated for [employer]

## 2013-07-05 ENCOUNTER — Encounter: Payer: Self-pay | Admitting: Internal Medicine

## 2013-07-05 ENCOUNTER — Ambulatory Visit: Payer: BC Managed Care – PPO | Admitting: Internal Medicine

## 2013-07-05 VITALS — BP 110/70 | HR 72 | Ht 63.0 in | Wt 235.0 lb

## 2013-07-05 DIAGNOSIS — Z1211 Encounter for screening for malignant neoplasm of colon: Secondary | ICD-10-CM

## 2013-07-05 NOTE — Patient Instructions (Signed)
You have been scheduled for a colonoscopy with propofol. Please follow written instructions given to you at your visit today.  Please pick up your supplies at the pharmacy. If you use inhalers (even only as needed), please bring them with you on the day of your procedure. Your physician has requested that you go to [email or web address] and enter the access code given to you at your visit today. This web site gives a general overview about your procedure. However, you should still follow specific instructions given to you by our office regarding your preparation for the procedure.  I appreciate the opportunity to care for you.

## 2013-07-05 NOTE — Progress Notes (Signed)
Subjective:    Patient ID: Cheryl Moore, female    DOB: 09/15/1960, 53 y.o.   MRN: [id number]  HPI The patient is a very nice lady with stage IV breast cancer, in remission. She wonders if she needs a screening colonoscopy as recommended by Dr. Quincy Simmonds. She has had a negative whole body PET scan recently.  Dr. Jana Hakim advised she discuss with me.  No GI sxs.  Allergies  Allergen Reactions  . Penicillins Swelling    "Throat swells shut"   Outpatient Prescriptions Prior to Visit  Medication Sig Dispense Refill  . ALPRAZolam (XANAX) 0.25 MG tablet Take 1 tablet (0.25 mg total) by mouth at bedtime as needed for anxiety.  30 tablet  1  . Ascorbic Acid (VITAMIN C PO) Take 1 tablet by mouth daily.      Marland Kitchen CALCIUM PO Take 1 tablet by mouth daily.      Marland Kitchen denosumab (XGEVA) 120 MG/1.7ML SOLN injection Inject 120 mg into the skin once.      . gabapentin (NEURONTIN) 100 MG capsule TAKE ONE CAPSULE 3 TIMES A DAY  90 capsule  1  . goserelin (ZOLADEX) 10.8 MG injection Inject 10.8 mg into the skin once.      Marland Kitchen ibuprofen (ADVIL,MOTRIN) 200 MG tablet Take 200-400 mg by mouth every 8 (eight) hours as needed for pain.       Marland Kitchen letrozole (FEMARA) 2.5 MG tablet Take 1 tablet (2.5 mg total) by mouth daily.  30 tablet  3  . LORazepam (ATIVAN) 0.5 MG tablet Take 1 tablet (0.5 mg total) by mouth every 8 (eight) hours as needed for anxiety.  30 tablet  0  . Multiple Vitamin (MULTIVITAMIN) tablet Take 1 tablet by mouth daily.      . palbociclib (IBRANCE) 125 MG capsule Take 1 capsule (125 mg total) by mouth daily with breakfast. Take whole with food x 21 days, rest 7 and repeat.  21 capsule  3  . pantoprazole (PROTONIX) 40 MG tablet TAKE 1 TABLET (40 MG TOTAL) BY MOUTH DAILY.  30 tablet  3  . traMADol (ULTRAM) 50 MG tablet Take 1 tablet (50 mg total) by mouth every 6 (six) hours as needed.  30 tablet  2  . UNABLE TO FIND Rx: L8015-Post Mastectomy Camisole (Quantity: 2) L8000- Post Surgical Bras  (Quantity: 6) D7824- Non-Silicone Breast Prosthesis (Quantity: 2) M3536- Silicone Breast Prosthesis (Quantity: 2) Dx: 174.9; Bilateral mastectomy  1 each  0  . valACYclovir (VALTREX) 500 MG tablet Take 1 tablet (500 mg total) by mouth daily.  30 tablet  5  . exemestane (AROMASIN) 25 MG tablet Take 1 tablet (25 mg total) by mouth daily after breakfast.  30 tablet  3   No facility-administered medications prior to visit.   Past Medical History  Diagnosis Date  . Breast cancer     right  . Anxiety   . Hot flashes   . History of radiation therapy 11/28/12-03/06/13    right breast/64.4Gy/ right hip=37.5Gy   . Obesity    Past Surgical History  Procedure Laterality Date  . Cesarean section  2005  . Breast surgery Right     breast bx  . Breast biopsy Right 05/23/2012    Procedure: SKIN PUNCH BIOPSY RIGHT BREAST;  Surgeon: [name], MD;  Location: [facility];  Service: General;  Laterality: Right;  . Portacath placement Left 05/23/2012    Procedure: INSERTION PORT-A-CATH;  Surgeon: [name], MD;  Location[facility];  Service: General;  Laterality: Left;  . Total mastectomy Left 10/26/2012    Dr Donne Hazel  . Simple mastectomy with axillary sentinel node biopsy Left 10/26/2012    Procedure: TOTAL MASTECTOMY;  Surgeon: [name], MD;  Location: [place];  Service: General;  Laterality: Left;  Marland Kitchen Mastectomy modified radical Right 10/26/2012    Procedure: MASTECTOMY MODIFIED RADICAL;  Surgeon: [name], MD;  Location: [facility];  Service: General;  Laterality: Right;   History   Social History  . Marital Status: Married    Spouse Name: N/A    Number of Children: 1  . Years of Education: N/A   Occupational History  . Housewife    Social History Main Topics  . Smoking status: Former Smoker -- 0.25 packs/day for 5 years    Types: Cigarettes    Quit date: 05/21/2002  . Smokeless tobacco: Never Used  . Alcohol Use: No     Comment: 1-2 per month  . Drug Use: No  . 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 Adopted: Yes    Review of Systems negative    Objective:   Physical Exam Obese, NAD Lungs clr ant Heart S1S2 no murmur abd obese     Assessment & Plan:  Special screening for malignant neoplasms, colon - Plan: Ambulatory referral to Gastroenterology  Will schedule screening colonoscopy. We reviewed other screening options such as Cologuard, annual hemoccults and CT colonoscopy. She chose optical colonoscopy.  The risks and benefits as well as alternatives of endoscopic procedure(s) have been discussed and reviewed. All questions answered. The patient agrees to proceed.

## 2013-07-06 ENCOUNTER — Encounter: Payer: Self-pay | Admitting: Internal Medicine

## 2013-07-07 ENCOUNTER — Other Ambulatory Visit (HOSPITAL_BASED_OUTPATIENT_CLINIC_OR_DEPARTMENT_OTHER): Payer: BC Managed Care – PPO

## 2013-07-07 ENCOUNTER — Other Ambulatory Visit: Payer: Self-pay | Admitting: Oncology

## 2013-07-07 DIAGNOSIS — C50419 Malignant neoplasm of upper-outer quadrant of unspecified female breast: Secondary | ICD-10-CM

## 2013-07-07 DIAGNOSIS — C50411 Malignant neoplasm of upper-outer quadrant of right female breast: Secondary | ICD-10-CM

## 2013-07-07 DIAGNOSIS — G629 Polyneuropathy, unspecified: Secondary | ICD-10-CM

## 2013-07-07 DIAGNOSIS — M25559 Pain in unspecified hip: Secondary | ICD-10-CM

## 2013-07-07 LAB — COMPREHENSIVE METABOLIC PANEL (CC13)
ALK PHOS: 74 U/L (ref 40–150)
ALT: 44 U/L (ref 0–55)
AST: 28 U/L (ref 5–34)
Albumin: 3.8 g/dL (ref 3.5–5.0)
Anion Gap: 10 mEq/L (ref 3–11)
BILIRUBIN TOTAL: 0.29 mg/dL (ref 0.20–1.20)
BUN: 17.2 mg/dL (ref 7.0–26.0)
CO2: 22 meq/L (ref 22–29)
CREATININE: 0.9 mg/dL (ref 0.6–1.1)
Calcium: 9.5 mg/dL (ref 8.4–10.4)
Chloride: 107 mEq/L (ref 98–109)
Glucose: 111 mg/dl (ref 70–140)
Potassium: 4.3 mEq/L (ref 3.5–5.1)
Sodium: 138 mEq/L (ref 136–145)
Total Protein: 7.3 g/dL (ref 6.4–8.3)

## 2013-07-07 LAB — CBC WITH DIFFERENTIAL/PLATELET
BASO%: 0.2 % (ref 0.0–2.0)
BASOS ABS: 0 10*3/uL (ref 0.0–0.1)
EOS ABS: 0.1 10*3/uL (ref 0.0–0.5)
EOS%: 1.7 % (ref 0.0–7.0)
HEMATOCRIT: 41.4 % (ref 34.8–46.6)
HGB: 14.4 g/dL (ref 11.6–15.9)
LYMPH%: 33.5 % (ref 14.0–49.7)
MCH: 31.9 pg (ref 25.1–34.0)
MCHC: 34.8 g/dL (ref 31.5–36.0)
MCV: 91.8 fL (ref 79.5–101.0)
MONO#: 0.6 10*3/uL (ref 0.1–0.9)
MONO%: 12.7 % (ref 0.0–14.0)
NEUT%: 51.9 % (ref 38.4–76.8)
NEUTROS ABS: 2.4 10*3/uL (ref 1.5–6.5)
PLATELETS: 244 10*3/uL (ref 145–400)
RBC: 4.51 10*6/uL (ref 3.70–5.45)
RDW: 13.2 % (ref 11.2–14.5)
WBC: 4.7 10*3/uL (ref 3.9–10.3)
lymph#: 1.6 10*3/uL (ref 0.9–3.3)

## 2013-07-10 ENCOUNTER — Ambulatory Visit (HOSPITAL_COMMUNITY)
Admission: RE | Admit: 2013-07-10 | Discharge: 2013-07-10 | Disposition: A | Discharge status: Home / Self Care | Payer: BC Managed Care – PPO | Source: Ambulatory Visit | Attending: Adult Health | Admitting: Adult Health

## 2013-07-10 DIAGNOSIS — G629 Polyneuropathy, unspecified: Secondary | ICD-10-CM

## 2013-07-10 DIAGNOSIS — M25559 Pain in unspecified hip: Secondary | ICD-10-CM

## 2013-07-10 NOTE — Progress Notes (Signed)
07/07/13 [employer] HAD REQUESTED MORE INFORMATION.    07/10/13 IBRANCE HAS BEEN APPROVED FROM 07/05/13 TO 01/25/2038 (?).  REF @ [id number].

## 2013-07-11 NOTE — Progress Notes (Signed)
R/s to 07/17/2013

## 2013-07-14 ENCOUNTER — Ambulatory Visit (HOSPITAL_BASED_OUTPATIENT_CLINIC_OR_DEPARTMENT_OTHER): Payer: BC Managed Care – PPO

## 2013-07-14 ENCOUNTER — Other Ambulatory Visit (HOSPITAL_BASED_OUTPATIENT_CLINIC_OR_DEPARTMENT_OTHER): Payer: BC Managed Care – PPO

## 2013-07-14 VITALS — BP 135/67 | HR 76 | Temp 98.3°F | Resp 15

## 2013-07-14 DIAGNOSIS — G629 Polyneuropathy, unspecified: Secondary | ICD-10-CM

## 2013-07-14 DIAGNOSIS — M25559 Pain in unspecified hip: Secondary | ICD-10-CM

## 2013-07-14 DIAGNOSIS — C50919 Malignant neoplasm of unspecified site of unspecified female breast: Secondary | ICD-10-CM

## 2013-07-14 DIAGNOSIS — C50419 Malignant neoplasm of upper-outer quadrant of unspecified female breast: Secondary | ICD-10-CM

## 2013-07-14 DIAGNOSIS — C50411 Malignant neoplasm of upper-outer quadrant of right female breast: Secondary | ICD-10-CM

## 2013-07-14 DIAGNOSIS — Z95828 Presence of other vascular implants and grafts: Secondary | ICD-10-CM

## 2013-07-14 LAB — CBC WITH DIFFERENTIAL/PLATELET
BASO%: 0.2 % (ref 0.0–2.0)
Basophils Absolute: 0 10*3/uL (ref 0.0–0.1)
EOS%: 2.2 % (ref 0.0–7.0)
Eosinophils Absolute: 0.1 10*3/uL (ref 0.0–0.5)
HEMATOCRIT: 37.1 % (ref 34.8–46.6)
HGB: 12.9 g/dL (ref 11.6–15.9)
LYMPH#: 1.5 10*3/uL (ref 0.9–3.3)
LYMPH%: 33.4 % (ref 14.0–49.7)
MCH: 31.9 pg (ref 25.1–34.0)
MCHC: 34.8 g/dL (ref 31.5–36.0)
MCV: 91.8 fL (ref 79.5–101.0)
MONO#: 0.3 10*3/uL (ref 0.1–0.9)
MONO%: 7.4 % (ref 0.0–14.0)
NEUT%: 56.8 % (ref 38.4–76.8)
NEUTROS ABS: 2.6 10*3/uL (ref 1.5–6.5)
PLATELETS: 238 10*3/uL (ref 145–400)
RBC: 4.04 10*6/uL (ref 3.70–5.45)
RDW: 13.2 % (ref 11.2–14.5)
WBC: 4.6 10*3/uL (ref 3.9–10.3)

## 2013-07-14 LAB — COMPREHENSIVE METABOLIC PANEL (CC13)
ALT: 37 U/L (ref 0–55)
ANION GAP: 11 meq/L (ref 3–11)
AST: 24 U/L (ref 5–34)
Albumin: 3.7 g/dL (ref 3.5–5.0)
Alkaline Phosphatase: 63 U/L (ref 40–150)
BUN: 18.1 mg/dL (ref 7.0–26.0)
CO2: 22 meq/L (ref 22–29)
CREATININE: 1 mg/dL (ref 0.6–1.1)
Calcium: 9.2 mg/dL (ref 8.4–10.4)
Chloride: 109 mEq/L (ref 98–109)
GLUCOSE: 114 mg/dL (ref 70–140)
Potassium: 4.4 mEq/L (ref 3.5–5.1)
SODIUM: 142 meq/L (ref 136–145)
TOTAL PROTEIN: 6.7 g/dL (ref 6.4–8.3)
Total Bilirubin: 0.34 mg/dL (ref 0.20–1.20)

## 2013-07-14 MED ORDER — SODIUM CHLORIDE 0.9 % IJ SOLN
10.0000 mL | INTRAMUSCULAR | Status: DC | PRN
  Administered 2013-07-14: 10 mL via INTRAVENOUS
  Filled 2013-07-14: qty 10

## 2013-07-14 MED ORDER — HEPARIN SOD (PORK) LOCK FLUSH 100 UNIT/ML IV SOLN
500.0000 [IU] | Freq: Once | INTRAVENOUS | Status: AC
  Administered 2013-07-14: 500 [IU] via INTRAVENOUS
  Filled 2013-07-14: qty 5

## 2013-07-14 NOTE — Patient Instructions (Signed)
Implanted Port Home Guide  An implanted port is a type of central line that is placed under the skin. Central lines are used to provide IV access when treatment or nutrition needs to be given through a person's veins. Implanted ports are used for long-term IV access. An implanted port may be placed because:    You need IV medicine that would be irritating to the small veins in your hands or arms.    You need long-term IV medicines, such as antibiotics.    You need IV nutrition for a long period.    You need frequent blood draws for lab tests.    You need dialysis.   Implanted ports are usually placed in the chest area, but they can also be placed in the upper arm, the abdomen, or the leg. An implanted port has two main parts:    Reservoir. The reservoir is round and will appear as a small, raised area under your skin. The reservoir is the part where a needle is inserted to give medicines or draw blood.    Catheter. The catheter is a thin, flexible tube that extends from the reservoir. The catheter is placed into a large vein. Medicine that is inserted into the reservoir goes into the catheter and then into the vein.   HOW WILL I CARE FOR MY INCISION SITE?  Do not get the incision site wet. Bathe or shower as directed by your health care Cheryl Moore.   HOW IS MY PORT ACCESSED?  Special steps must be taken to access the port:    Before the port is accessed, a numbing cream can be placed on the skin. This helps numb the skin over the port site.    Your health care Cheryl Moore uses a sterile technique to access the port.   Your health care Cheryl Moore must put on a mask and sterile gloves.   The skin over your port is cleaned carefully with an antiseptic and allowed to dry.   The port is gently pinched between sterile gloves, and a needle is inserted into the port.   Only "non-coring" port needles should be used to access the port. Once the port is accessed, a blood return should be checked. This helps  ensure that the port is in the vein and is not clogged.    If your port needs to remain accessed for a constant infusion, a clear (transparent) bandage will be placed over the needle site. The bandage and needle will need to be changed every week, or as directed by your health care Cheryl Moore.    Keep the bandage covering the needle clean and dry. Do not get it wet. Follow your health care Cheryl Moore's instructions on how to take a shower or bath while the port is accessed.    If your port does not need to stay accessed, no bandage is needed over the port.   WHAT IS FLUSHING?  Flushing helps keep the port from getting clogged. Follow your health care Cheryl Moore's instructions on how and when to flush the port. Ports are usually flushed with saline solution or a medicine called heparin. The need for flushing will depend on how the port is used.    If the port is used for intermittent medicines or blood draws, the port will need to be flushed:    After medicines have been given.    After blood has been drawn.    As part of routine maintenance.    If a constant infusion is   running, the port may not need to be flushed.   HOW LONG WILL MY PORT STAY IMPLANTED?  The port can stay in for as long as your health care Cheryl Moore thinks it is needed. When it is time for the port to come out, surgery will be done to remove it. The procedure is similar to the one performed when the port was put in.   WHEN SHOULD I SEEK IMMEDIATE MEDICAL CARE?  When you have an implanted port, you should seek immediate medical care if:    You notice a bad smell coming from the incision site.    You have swelling, redness, or drainage at the incision site.    You have more swelling or pain at the port site or the surrounding area.    You have a fever that is not controlled with medicine.  Document Released: 01/12/2005 Document Revised: 11/02/2012 Document Reviewed: 09/19/2012  [employer] Patient Information 2015 [employer]. This  information is not intended to replace advice given to you by your health care Cheryl Moore. Make sure you discuss any questions you have with your health care Cheryl Moore.

## 2013-07-17 ENCOUNTER — Ambulatory Visit (HOSPITAL_COMMUNITY): Admission: RE | Admit: 2013-07-17 | Payer: BC Managed Care – PPO | Source: Ambulatory Visit

## 2013-07-20 ENCOUNTER — Encounter (INDEPENDENT_AMBULATORY_CARE_PROVIDER_SITE_OTHER): Payer: Self-pay | Admitting: General Surgery

## 2013-07-21 ENCOUNTER — Other Ambulatory Visit (HOSPITAL_BASED_OUTPATIENT_CLINIC_OR_DEPARTMENT_OTHER): Payer: BC Managed Care – PPO

## 2013-07-21 DIAGNOSIS — G629 Polyneuropathy, unspecified: Secondary | ICD-10-CM

## 2013-07-21 DIAGNOSIS — C7952 Secondary malignant neoplasm of bone marrow: Secondary | ICD-10-CM

## 2013-07-21 DIAGNOSIS — C50919 Malignant neoplasm of unspecified site of unspecified female breast: Secondary | ICD-10-CM

## 2013-07-21 DIAGNOSIS — C50411 Malignant neoplasm of upper-outer quadrant of right female breast: Secondary | ICD-10-CM

## 2013-07-21 DIAGNOSIS — C773 Secondary and unspecified malignant neoplasm of axilla and upper limb lymph nodes: Secondary | ICD-10-CM

## 2013-07-21 DIAGNOSIS — C50419 Malignant neoplasm of upper-outer quadrant of unspecified female breast: Secondary | ICD-10-CM

## 2013-07-21 DIAGNOSIS — C7951 Secondary malignant neoplasm of bone: Secondary | ICD-10-CM

## 2013-07-21 DIAGNOSIS — M25559 Pain in unspecified hip: Secondary | ICD-10-CM

## 2013-07-21 LAB — COMPREHENSIVE METABOLIC PANEL (CC13)
ALBUMIN: 3.7 g/dL (ref 3.5–5.0)
ALT: 28 U/L (ref 0–55)
AST: 19 U/L (ref 5–34)
Alkaline Phosphatase: 59 U/L (ref 40–150)
Anion Gap: 9 mEq/L (ref 3–11)
BUN: 16.7 mg/dL (ref 7.0–26.0)
CALCIUM: 9.2 mg/dL (ref 8.4–10.4)
CO2: 23 meq/L (ref 22–29)
Chloride: 108 mEq/L (ref 98–109)
Creatinine: 0.9 mg/dL (ref 0.6–1.1)
Glucose: 142 mg/dl — ABNORMAL HIGH (ref 70–140)
Potassium: 4.2 mEq/L (ref 3.5–5.1)
SODIUM: 139 meq/L (ref 136–145)
TOTAL PROTEIN: 6.7 g/dL (ref 6.4–8.3)
Total Bilirubin: 0.38 mg/dL (ref 0.20–1.20)

## 2013-07-21 LAB — CBC WITH DIFFERENTIAL/PLATELET
BASO%: 0.4 % (ref 0.0–2.0)
Basophils Absolute: 0 10*3/uL (ref 0.0–0.1)
EOS%: 0.7 % (ref 0.0–7.0)
Eosinophils Absolute: 0 10*3/uL (ref 0.0–0.5)
HCT: 38.9 % (ref 34.8–46.6)
HGB: 13.2 g/dL (ref 11.6–15.9)
LYMPH%: 31.2 % (ref 14.0–49.7)
MCH: 31.7 pg (ref 25.1–34.0)
MCHC: 33.9 g/dL (ref 31.5–36.0)
MCV: 93.5 fL (ref 79.5–101.0)
MONO#: 0.2 10*3/uL (ref 0.1–0.9)
MONO%: 4.8 % (ref 0.0–14.0)
NEUT#: 2.8 10*3/uL (ref 1.5–6.5)
NEUT%: 62.9 % (ref 38.4–76.8)
Platelets: 279 10*3/uL (ref 145–400)
RBC: 4.16 10*6/uL (ref 3.70–5.45)
RDW: 13.2 % (ref 11.2–14.5)
WBC: 4.5 10*3/uL (ref 3.9–10.3)
lymph#: 1.4 10*3/uL (ref 0.9–3.3)

## 2013-07-25 ENCOUNTER — Telehealth: Payer: Self-pay | Admitting: *Deleted

## 2013-07-25 NOTE — Telephone Encounter (Signed)
Patient pain is better, more tolerable, but she still was unable to complete MRI due to claustrophobia. She attempted taking oral sedation before arrival, but was still unable to complete and now calling to have orders changed to MRI with sedation. Called and spoke to [place] in scheduling, patient is now scheduled at [facility] on 08/24/2013 @ 8am. Pt to arrive with driver at [facility] for check-in. Patient notified of change, instructed to call if she is to develop any problems or increase in pain. Patient verbalized understanding.

## 2013-07-25 NOTE — Telephone Encounter (Signed)
Message copied by Verlon Setting on Tue Jul 25, 2013 11:34 AM ------      Message from: Minette Headland      Created: Sun Jul 23, 2013  8:14 AM       Can someone please call patient and see how her pain is?  If it isn't improved, have her see me on Thursday.  We can make a spot.              Thanks, LC       ----- Message -----         From: [facility]         Sent: 07/21/2013   3:10 PM           To: Minette Headland, NP                   ------

## 2013-07-26 ENCOUNTER — Telehealth: Payer: Self-pay

## 2013-07-26 NOTE — Telephone Encounter (Signed)
Message copied by Prentiss Bells on Wed Jul 26, 2013 10:36 AM ------      Message from: Minette Headland      Created: Sun Jul 23, 2013  8:14 AM       Can someone please call patient and see how her pain is?  If it isn't improved, have her see me on Thursday.  We can make a spot.              Thanks, LC       ----- Message -----         From: [facility]         Sent: 07/21/2013   3:10 PM           To: Minette Headland, NP                   ------

## 2013-07-27 ENCOUNTER — Other Ambulatory Visit: Payer: Self-pay | Admitting: *Deleted

## 2013-07-27 ENCOUNTER — Other Ambulatory Visit (HOSPITAL_BASED_OUTPATIENT_CLINIC_OR_DEPARTMENT_OTHER): Payer: BC Managed Care – PPO

## 2013-07-27 ENCOUNTER — Ambulatory Visit (HOSPITAL_BASED_OUTPATIENT_CLINIC_OR_DEPARTMENT_OTHER): Payer: BC Managed Care – PPO

## 2013-07-27 VITALS — BP 147/75 | HR 73 | Temp 97.2°F

## 2013-07-27 DIAGNOSIS — C50411 Malignant neoplasm of upper-outer quadrant of right female breast: Secondary | ICD-10-CM

## 2013-07-27 DIAGNOSIS — C50419 Malignant neoplasm of upper-outer quadrant of unspecified female breast: Secondary | ICD-10-CM

## 2013-07-27 DIAGNOSIS — C7952 Secondary malignant neoplasm of bone marrow: Secondary | ICD-10-CM

## 2013-07-27 DIAGNOSIS — M25559 Pain in unspecified hip: Secondary | ICD-10-CM

## 2013-07-27 DIAGNOSIS — C7951 Secondary malignant neoplasm of bone: Secondary | ICD-10-CM

## 2013-07-27 DIAGNOSIS — C50919 Malignant neoplasm of unspecified site of unspecified female breast: Secondary | ICD-10-CM

## 2013-07-27 DIAGNOSIS — Z5112 Encounter for antineoplastic immunotherapy: Secondary | ICD-10-CM

## 2013-07-27 DIAGNOSIS — G629 Polyneuropathy, unspecified: Secondary | ICD-10-CM

## 2013-07-27 LAB — CBC WITH DIFFERENTIAL/PLATELET
BASO%: 0.5 % (ref 0.0–2.0)
Basophils Absolute: 0 10*3/uL (ref 0.0–0.1)
EOS ABS: 0 10*3/uL (ref 0.0–0.5)
EOS%: 1.5 % (ref 0.0–7.0)
HEMATOCRIT: 38.6 % (ref 34.8–46.6)
HGB: 13 g/dL (ref 11.6–15.9)
LYMPH#: 1.5 10*3/uL (ref 0.9–3.3)
LYMPH%: 45.3 % (ref 14.0–49.7)
MCH: 31.7 pg (ref 25.1–34.0)
MCHC: 33.8 g/dL (ref 31.5–36.0)
MCV: 93.7 fL (ref 79.5–101.0)
MONO#: 0.2 10*3/uL (ref 0.1–0.9)
MONO%: 6.4 % (ref 0.0–14.0)
NEUT%: 46.3 % (ref 38.4–76.8)
NEUTROS ABS: 1.5 10*3/uL (ref 1.5–6.5)
Platelets: 212 10*3/uL (ref 145–400)
RBC: 4.12 10*6/uL (ref 3.70–5.45)
RDW: 13.3 % (ref 11.2–14.5)
WBC: 3.3 10*3/uL — ABNORMAL LOW (ref 3.9–10.3)

## 2013-07-27 MED ORDER — GOSERELIN ACETATE 3.6 MG ~~LOC~~ IMPL
3.6000 mg | DRUG_IMPLANT | SUBCUTANEOUS | Status: DC
  Administered 2013-07-27: 3.6 mg via SUBCUTANEOUS
  Filled 2013-07-27: qty 3.6

## 2013-07-27 MED ORDER — DENOSUMAB 120 MG/1.7ML ~~LOC~~ SOLN
120.0000 mg | Freq: Once | SUBCUTANEOUS | Status: AC
  Administered 2013-07-27: 120 mg via SUBCUTANEOUS
  Filled 2013-07-27: qty 1.7

## 2013-07-31 ENCOUNTER — Other Ambulatory Visit (HOSPITAL_COMMUNITY): Payer: BC Managed Care – PPO

## 2013-08-03 ENCOUNTER — Other Ambulatory Visit: Payer: Self-pay | Admitting: Adult Health

## 2013-08-03 ENCOUNTER — Other Ambulatory Visit: Payer: Self-pay | Admitting: *Deleted

## 2013-08-03 DIAGNOSIS — C50419 Malignant neoplasm of upper-outer quadrant of unspecified female breast: Secondary | ICD-10-CM

## 2013-08-07 ENCOUNTER — Telehealth: Payer: Self-pay | Admitting: *Deleted

## 2013-08-07 NOTE — Telephone Encounter (Signed)
Called to inform pt of appt for MRI of spine on 08/24/13. Pt was not home but left a detailed message to let her know about this appt. If pt has any questions she can call this nurse back @ [phone].

## 2013-08-11 ENCOUNTER — Other Ambulatory Visit (HOSPITAL_BASED_OUTPATIENT_CLINIC_OR_DEPARTMENT_OTHER): Payer: BC Managed Care – PPO

## 2013-08-11 DIAGNOSIS — M25559 Pain in unspecified hip: Secondary | ICD-10-CM

## 2013-08-11 DIAGNOSIS — C7951 Secondary malignant neoplasm of bone: Secondary | ICD-10-CM

## 2013-08-11 DIAGNOSIS — C50919 Malignant neoplasm of unspecified site of unspecified female breast: Secondary | ICD-10-CM

## 2013-08-11 DIAGNOSIS — G629 Polyneuropathy, unspecified: Secondary | ICD-10-CM

## 2013-08-11 DIAGNOSIS — C773 Secondary and unspecified malignant neoplasm of axilla and upper limb lymph nodes: Secondary | ICD-10-CM

## 2013-08-11 DIAGNOSIS — C7952 Secondary malignant neoplasm of bone marrow: Secondary | ICD-10-CM

## 2013-08-11 DIAGNOSIS — C50419 Malignant neoplasm of upper-outer quadrant of unspecified female breast: Secondary | ICD-10-CM

## 2013-08-11 LAB — CBC WITH DIFFERENTIAL/PLATELET
BASO%: 1.3 % (ref 0.0–2.0)
Basophils Absolute: 0 10*3/uL (ref 0.0–0.1)
EOS%: 0.3 % (ref 0.0–7.0)
Eosinophils Absolute: 0 10*3/uL (ref 0.0–0.5)
HEMATOCRIT: 38.7 % (ref 34.8–46.6)
HEMOGLOBIN: 13.2 g/dL (ref 11.6–15.9)
LYMPH#: 1.3 10*3/uL (ref 0.9–3.3)
LYMPH%: 39.7 % (ref 14.0–49.7)
MCH: 32.3 pg (ref 25.1–34.0)
MCHC: 34.2 g/dL (ref 31.5–36.0)
MCV: 94.2 fL (ref 79.5–101.0)
MONO#: 0.4 10*3/uL (ref 0.1–0.9)
MONO%: 12.5 % (ref 0.0–14.0)
NEUT#: 1.5 10*3/uL (ref 1.5–6.5)
NEUT%: 46.2 % (ref 38.4–76.8)
PLATELETS: 303 10*3/uL (ref 145–400)
RBC: 4.11 10*6/uL (ref 3.70–5.45)
RDW: 16 % — ABNORMAL HIGH (ref 11.2–14.5)
WBC: 3.3 10*3/uL — AB (ref 3.9–10.3)

## 2013-08-17 ENCOUNTER — Ambulatory Visit (INDEPENDENT_AMBULATORY_CARE_PROVIDER_SITE_OTHER): Payer: BC Managed Care – PPO | Admitting: General Surgery

## 2013-08-17 ENCOUNTER — Encounter (INDEPENDENT_AMBULATORY_CARE_PROVIDER_SITE_OTHER): Payer: Self-pay | Admitting: General Surgery

## 2013-08-17 VITALS — BP 123/80 | HR 76 | Temp 97.7°F | Resp 18 | Ht 62.0 in | Wt 237.8 lb

## 2013-08-17 DIAGNOSIS — C50419 Malignant neoplasm of upper-outer quadrant of unspecified female breast: Secondary | ICD-10-CM

## 2013-08-17 DIAGNOSIS — C50411 Malignant neoplasm of upper-outer quadrant of right female breast: Secondary | ICD-10-CM

## 2013-08-17 NOTE — Progress Notes (Signed)
Subjective:     Patient ID: Cheryl Moore, female   DOB: 07-21-1960, 53 y.o.   MRN: [id number]  HPI 1 yof who underwent primary chemotherapy followed by bilateral mastectomies. She does have stage IV disease.  She is maintained on femara and has recently started ibrance which she is tolerating ok right now. This has led to her being more emotional she thinks.  She sees Dr Jana Hakim every three months.  She has recent pet scan that shows great result. She has no real complaints today except for some irritation of the lateral portion of her port incision.    Review of Systems EXAM:  NUCLEAR MEDICINE PET SKULL BASE TO THIGH  TECHNIQUE:  12.3 mCi F-18 FDG was injected intravenously. Full-ring PET imaging  was performed from the skull base to thigh after the radiotracer. CT  data was obtained and used for attenuation correction and anatomic  localization.  FASTING BLOOD GLUCOSE: Value: 94 mg/dl  COMPARISON: PET-CT scan 05/26/2012  FINDINGS:  NECK  No hypermetabolic lymph nodes in the neck.  CHEST  No residual hypermetabolic axillary nodes. No hypermetabolic  mediastinal lymph nodes. No suspicious pulmonary nodules. Mild  activity in the right upper lobe associated with the ground-glass  opacity is felt to relate to radiation change  ABDOMEN/PELVIS  No abnormal hypermetabolic activity within the liver, pancreas,  adrenal glands, or spleen. No hypermetabolic lymph nodes in the  abdomen or pelvis. Mild heterogeneity of metabolic activity in the  inferior right hepatic lobe with no lesion identified on comparison  contrast CT.  SKELETON  There is resolution of the metabolic activity associated with the  sclerotic lesions within the spine.  IMPRESSION:  1. Positive response to chemo radiation therapy and surgery.  2. No residual hypermetabolic lymph nodes in the axilla or  mediastinum.  3. No metabolic activity associated with the sclerotic skeletal  metastasis.  4. Mild heterogeneity of  metabolic activity within the right hepatic  lobe without lesion on comparison contrast CT is favored to  represent benign heterogeneity of the liver.      Objective:   Physical Exam  Vitals reviewed. Constitutional: She appears well-developed and well-nourished.  Pulmonary/Chest: Right breast exhibits no mass. Left breast exhibits no mass.    Lymphadenopathy:    She has no cervical adenopathy.    She has no axillary adenopathy.       Right: No supraclavicular adenopathy present.       Left: No supraclavicular adenopathy present.       Assessment:     Stage IV breast cancer     Plan:     She is doing well with stage IV disease. She has had positive response on pet and encouraging she is doing ok on ibrance so far.  We discussed continued management of her disease. She is going to cover her port incision and put neosporin on it for now.  I think this is just irritated. She will call me back if worse. Otherwise she will follow up with Dr Jana Hakim and see me as needed

## 2013-08-21 ENCOUNTER — Encounter (HOSPITAL_COMMUNITY): Payer: Self-pay | Admitting: Pharmacy Technician

## 2013-08-23 ENCOUNTER — Encounter (HOSPITAL_COMMUNITY): Payer: Self-pay | Admitting: *Deleted

## 2013-08-24 ENCOUNTER — Encounter (HOSPITAL_COMMUNITY)
Admission: RE | Disposition: A | Discharge status: Home / Self Care | Payer: Self-pay | Source: Ambulatory Visit | Attending: Oncology

## 2013-08-24 ENCOUNTER — Ambulatory Visit (HOSPITAL_COMMUNITY): Payer: BC Managed Care – PPO | Admitting: Certified Registered"

## 2013-08-24 ENCOUNTER — Ambulatory Visit (HOSPITAL_COMMUNITY): Admission: RE | Admit: 2013-08-24 | Payer: BC Managed Care – PPO | Source: Ambulatory Visit | Admitting: Oncology

## 2013-08-24 ENCOUNTER — Ambulatory Visit (HOSPITAL_COMMUNITY)
Admission: RE | Admit: 2013-08-24 | Discharge: 2013-08-24 | Disposition: A | Discharge status: Home / Self Care | Payer: BC Managed Care – PPO | Source: Ambulatory Visit | Attending: Oncology | Admitting: Oncology

## 2013-08-24 ENCOUNTER — Encounter (HOSPITAL_COMMUNITY): Payer: BC Managed Care – PPO | Admitting: Certified Registered"

## 2013-08-24 ENCOUNTER — Ambulatory Visit (HOSPITAL_COMMUNITY)
Admission: RE | Admit: 2013-08-24 | Discharge: 2013-08-24 | Disposition: A | Discharge status: Home / Self Care | Payer: BC Managed Care – PPO | Source: Ambulatory Visit | Attending: Adult Health | Admitting: Adult Health

## 2013-08-24 DIAGNOSIS — C50419 Malignant neoplasm of upper-outer quadrant of unspecified female breast: Secondary | ICD-10-CM | POA: Insufficient documentation

## 2013-08-24 DIAGNOSIS — M47812 Spondylosis without myelopathy or radiculopathy, cervical region: Secondary | ICD-10-CM | POA: Insufficient documentation

## 2013-08-24 DIAGNOSIS — J90 Pleural effusion, not elsewhere classified: Secondary | ICD-10-CM | POA: Insufficient documentation

## 2013-08-24 HISTORY — PX: RADIOLOGY WITH ANESTHESIA: SHX6223

## 2013-08-24 SURGERY — RADIOLOGY WITH ANESTHESIA
Anesthesia: General

## 2013-08-24 MED ORDER — OXYCODONE HCL 5 MG PO TABS
ORAL_TABLET | ORAL | Status: AC
  Filled 2013-08-24: qty 1

## 2013-08-24 MED ORDER — OXYCODONE HCL 5 MG/5ML PO SOLN
5.0000 mg | Freq: Once | ORAL | Status: AC | PRN
Start: 2013-08-24 — End: 2013-08-24

## 2013-08-24 MED ORDER — MEPERIDINE HCL 25 MG/ML IJ SOLN: 6.2500 mg | INTRAMUSCULAR | Status: DC | PRN

## 2013-08-24 MED ORDER — OXYCODONE HCL 5 MG PO TABS
5.0000 mg | ORAL_TABLET | Freq: Once | ORAL | Status: AC | PRN
  Administered 2013-08-24: 5 mg via ORAL

## 2013-08-24 MED ORDER — GADOBENATE DIMEGLUMINE 529 MG/ML IV SOLN
20.0000 mL | Freq: Once | INTRAVENOUS | Status: DC
Start: 2013-08-24 — End: 2013-08-25

## 2013-08-24 MED ORDER — ONDANSETRON HCL 4 MG/2ML IJ SOLN
INTRAMUSCULAR | Status: AC
  Filled 2013-08-24: qty 2

## 2013-08-24 MED ORDER — HYDROMORPHONE HCL PF 1 MG/ML IJ SOLN: 0.2500 mg | INTRAMUSCULAR | Status: DC | PRN

## 2013-08-24 MED ORDER — ONDANSETRON HCL 4 MG/2ML IJ SOLN
4.0000 mg | Freq: Once | INTRAMUSCULAR | Status: AC | PRN
  Administered 2013-08-24: 4 mg via INTRAVENOUS

## 2013-08-24 NOTE — Progress Notes (Addendum)
Patient states pain is better at discharge after OXY IR, and nausea is better as well

## 2013-08-24 NOTE — Progress Notes (Signed)
Report given to celine rn as caregiver 

## 2013-08-24 NOTE — Anesthesia Postprocedure Evaluation (Signed)
Anesthesia Post Note  Patient: Cheryl Moore  Procedure(s) Performed: Procedure(s) (LRB): MRI (N/A)  Anesthesia type: general  Patient location: PACU  Post pain: Pain level controlled  Post assessment: Patient's Cardiovascular Status Stable  Last Vitals:  Filed Vitals:   08/24/13 1144  BP:   Pulse: 69  Temp: 36.4 C  Resp: 9    Post vital signs: Reviewed and stable  Level of consciousness: sedated  Complications: No apparent anesthesia complications

## 2013-08-24 NOTE — Discharge Instructions (Signed)
General Anesthesia, Adult, Care After  °Refer to this sheet in the next few weeks. These instructions provide you with information on caring for yourself after your procedure. Your health care provider may also give you more specific instructions. Your treatment has been planned according to current medical practices, but problems sometimes occur. Call your health care provider if you have any problems or questions after your procedure.  °WHAT TO EXPECT AFTER THE PROCEDURE  °After the procedure, it is typical to experience:  °Sleepiness.  °Nausea and vomiting. °HOME CARE INSTRUCTIONS  °For the first 24 hours after general anesthesia:  °Have a responsible person with you.  °Do not drive a car. If you are alone, do not take public transportation.  °Do not drink alcohol.  °Do not take medicine that has not been prescribed by your health care provider.  °Do not sign important papers or make important decisions.  °You may resume a normal diet and activities as directed by your health care provider.  °Change bandages (dressings) as directed.  °If you have questions or problems that seem related to general anesthesia, call the hospital and ask for the anesthetist or anesthesiologist on call. °SEEK MEDICAL CARE IF:  °You have nausea and vomiting that continue the day after anesthesia.  °You develop a rash. °SEEK IMMEDIATE MEDICAL CARE IF:  °You have difficulty breathing.  °You have chest pain.  °You have any allergic problems. °Document Released: 04/20/2000 Document Revised: 09/14/2012 Document Reviewed: 07/28/2012  °[employer]® Patient Information ©2014 [employer].  ° °What to eat: ° °For your first meals, you should eat lightly; only small meals initially.  If you do not have nausea, you may eat larger meals.  Avoid spicy, greasy and heavy food.   ° °

## 2013-08-24 NOTE — Anesthesia Preprocedure Evaluation (Addendum)
Anesthesia Evaluation  Patient identified by MRN, date of birth, ID band Patient awake    Reviewed: Allergy & Precautions, H&P , NPO status , Patient's Chart, lab work & pertinent test results  History of Anesthesia Complications Negative for: history of anesthetic complications  Airway Mallampati: II TM Distance: >3 FB Neck ROM: Full    Dental   Pulmonary former smoker,  Stopped smoking 2004   Pulmonary exam normal       Cardiovascular negative cardio ROS  Rhythm:Regular Rate:Normal     Neuro/Psych negative neurological ROS     GI/Hepatic negative GI ROS, Neg liver ROS,   Endo/Other  negative endocrine ROS  Renal/GU negative Renal ROS     Musculoskeletal negative musculoskeletal ROS (+)   Abdominal (+) + obese,  Abdomen: soft.    Peds  Hematology negative hematology ROS (+)   Anesthesia Other Findings   Reproductive/Obstetrics                          Anesthesia Physical Anesthesia Plan  ASA: II  Anesthesia Plan: General   Post-op Pain Management:    Induction: Intravenous  Airway Management Planned: LMA  Additional Equipment:   Intra-op Plan:   Post-operative Plan: Extubation in OR  Informed Consent: I have reviewed the patients History and Physical, chart, labs and discussed the procedure including the risks, benefits and alternatives for the proposed anesthesia with the patient or authorized representative who has indicated his/her understanding and acceptance.   Dental advisory given  Plan Discussed with: CRNA and Surgeon  Anesthesia Plan Comments:        Anesthesia Quick Evaluation

## 2013-08-24 NOTE — Transfer of Care (Signed)
Immediate Anesthesia Transfer of Care Note  Patient: Cheryl Moore  Procedure(s) Performed: Procedure(s): MRI (N/A)  Patient Location: PACU  Anesthesia Type:General  Level of Consciousness: awake, oriented, sedated and patient cooperative  Airway & Oxygen Therapy: Patient Spontanous Breathing and Patient connected to nasal cannula oxygen  Post-op Assessment: Report given to PACU RN, Post -op Vital signs reviewed and stable and Patient moving all extremities  Post vital signs: Reviewed and stable  Complications: No apparent anesthesia complications

## 2013-08-25 ENCOUNTER — Other Ambulatory Visit (HOSPITAL_BASED_OUTPATIENT_CLINIC_OR_DEPARTMENT_OTHER): Payer: BC Managed Care – PPO

## 2013-08-25 ENCOUNTER — Ambulatory Visit (HOSPITAL_BASED_OUTPATIENT_CLINIC_OR_DEPARTMENT_OTHER): Payer: BC Managed Care – PPO

## 2013-08-25 ENCOUNTER — Ambulatory Visit: Payer: BC Managed Care – PPO

## 2013-08-25 DIAGNOSIS — Z5111 Encounter for antineoplastic chemotherapy: Secondary | ICD-10-CM

## 2013-08-25 DIAGNOSIS — C50411 Malignant neoplasm of upper-outer quadrant of right female breast: Secondary | ICD-10-CM

## 2013-08-25 DIAGNOSIS — C773 Secondary and unspecified malignant neoplasm of axilla and upper limb lymph nodes: Secondary | ICD-10-CM

## 2013-08-25 DIAGNOSIS — C50919 Malignant neoplasm of unspecified site of unspecified female breast: Secondary | ICD-10-CM

## 2013-08-25 DIAGNOSIS — C7952 Secondary malignant neoplasm of bone marrow: Secondary | ICD-10-CM

## 2013-08-25 DIAGNOSIS — M25559 Pain in unspecified hip: Secondary | ICD-10-CM

## 2013-08-25 DIAGNOSIS — Z95828 Presence of other vascular implants and grafts: Secondary | ICD-10-CM

## 2013-08-25 DIAGNOSIS — C50419 Malignant neoplasm of upper-outer quadrant of unspecified female breast: Secondary | ICD-10-CM

## 2013-08-25 DIAGNOSIS — C7951 Secondary malignant neoplasm of bone: Secondary | ICD-10-CM

## 2013-08-25 DIAGNOSIS — G629 Polyneuropathy, unspecified: Secondary | ICD-10-CM

## 2013-08-25 LAB — CBC WITH DIFFERENTIAL/PLATELET
BASO%: 0.9 % (ref 0.0–2.0)
Basophils Absolute: 0 10*3/uL (ref 0.0–0.1)
EOS%: 0.6 % (ref 0.0–7.0)
Eosinophils Absolute: 0 10*3/uL (ref 0.0–0.5)
HEMATOCRIT: 35.5 % (ref 34.8–46.6)
HGB: 12.2 g/dL (ref 11.6–15.9)
LYMPH#: 1.4 10*3/uL (ref 0.9–3.3)
LYMPH%: 37.8 % (ref 14.0–49.7)
MCH: 33 pg (ref 25.1–34.0)
MCHC: 34.3 g/dL (ref 31.5–36.0)
MCV: 96.2 fL (ref 79.5–101.0)
MONO#: 0.3 10*3/uL (ref 0.1–0.9)
MONO%: 7.3 % (ref 0.0–14.0)
NEUT#: 2 10*3/uL (ref 1.5–6.5)
NEUT%: 53.4 % (ref 38.4–76.8)
Platelets: 242 10*3/uL (ref 145–400)
RBC: 3.69 10*6/uL — AB (ref 3.70–5.45)
RDW: 17.5 % — ABNORMAL HIGH (ref 11.2–14.5)
WBC: 3.8 10*3/uL — ABNORMAL LOW (ref 3.9–10.3)

## 2013-08-25 LAB — COMPREHENSIVE METABOLIC PANEL (CC13)
ALBUMIN: 3.7 g/dL (ref 3.5–5.0)
ALK PHOS: 48 U/L (ref 40–150)
ALT: 20 U/L (ref 0–55)
AST: 16 U/L (ref 5–34)
Anion Gap: 8 mEq/L (ref 3–11)
BUN: 13.4 mg/dL (ref 7.0–26.0)
CALCIUM: 9.4 mg/dL (ref 8.4–10.4)
CHLORIDE: 108 meq/L (ref 98–109)
CO2: 26 mEq/L (ref 22–29)
Creatinine: 0.9 mg/dL (ref 0.6–1.1)
GLUCOSE: 83 mg/dL (ref 70–140)
Potassium: 4 mEq/L (ref 3.5–5.1)
Sodium: 142 mEq/L (ref 136–145)
Total Bilirubin: 0.33 mg/dL (ref 0.20–1.20)
Total Protein: 6.7 g/dL (ref 6.4–8.3)

## 2013-08-25 MED ORDER — DENOSUMAB 120 MG/1.7ML ~~LOC~~ SOLN
120.0000 mg | Freq: Once | SUBCUTANEOUS | Status: AC
  Administered 2013-08-25: 120 mg via SUBCUTANEOUS
  Filled 2013-08-25: qty 1.7

## 2013-08-25 MED ORDER — GOSERELIN ACETATE 3.6 MG ~~LOC~~ IMPL
3.6000 mg | DRUG_IMPLANT | SUBCUTANEOUS | Status: DC
  Administered 2013-08-25: 3.6 mg via SUBCUTANEOUS
  Filled 2013-08-25: qty 3.6

## 2013-08-25 MED ORDER — HEPARIN SOD (PORK) LOCK FLUSH 100 UNIT/ML IV SOLN
500.0000 [IU] | Freq: Once | INTRAVENOUS | Status: AC
  Administered 2013-08-25: 500 [IU] via INTRAVENOUS
  Filled 2013-08-25: qty 5

## 2013-08-25 MED ORDER — SODIUM CHLORIDE 0.9 % IJ SOLN
10.0000 mL | INTRAMUSCULAR | Status: DC | PRN
  Administered 2013-08-25: 10 mL via INTRAVENOUS
  Filled 2013-08-25: qty 10

## 2013-08-25 NOTE — Patient Instructions (Signed)
Denosumab injection What is this medicine? DENOSUMAB (den oh sue mab) slows bone breakdown. Prolia is used to treat osteoporosis in women after menopause and in men. Xgeva is used to prevent bone fractures and other bone problems caused by cancer bone metastases. Xgeva is also used to treat giant cell tumor of the bone. This medicine may be used for other purposes; ask your health care provider or pharmacist if you have questions. COMMON BRAND NAME(S): Prolia, XGEVA What should I tell my health care provider before I take this medicine? They need to know if you have any of these conditions: -dental disease -eczema -infection or history of infections -kidney disease or on dialysis -low blood calcium or vitamin D -malabsorption syndrome -scheduled to have surgery or tooth extraction -taking medicine that contains denosumab -thyroid or parathyroid disease -an unusual reaction to denosumab, other medicines, foods, dyes, or preservatives -pregnant or trying to get pregnant -breast-feeding How should I use this medicine? This medicine is for injection under the skin. It is given by a health care professional in a hospital or clinic setting. If you are getting Prolia, a special MedGuide will be given to you by the pharmacist with each prescription and refill. Be sure to read this information carefully each time. For Prolia, talk to your pediatrician regarding the use of this medicine in children. Special care may be needed. For Xgeva, talk to your pediatrician regarding the use of this medicine in children. While this drug may be prescribed for children as young as 13 years for selected conditions, precautions do apply. Overdosage: If you think you've taken too much of this medicine contact a poison control center or emergency room at once. Overdosage: If you think you have taken too much of this medicine contact a poison control center or emergency room at once. NOTE: This medicine is only for  you. Do not share this medicine with others. What if I miss a dose? It is important not to miss your dose. Call your doctor or health care professional if you are unable to keep an appointment. What may interact with this medicine? Do not take this medicine with any of the following medications: -other medicines containing denosumab This medicine may also interact with the following medications: -medicines that suppress the immune system -medicines that treat cancer -steroid medicines like prednisone or cortisone This list may not describe all possible interactions. Give your health care provider a list of all the medicines, herbs, non-prescription drugs, or dietary supplements you use. Also tell them if you smoke, drink alcohol, or use illegal drugs. Some items may interact with your medicine. What should I watch for while using this medicine? Visit your doctor or health care professional for regular checks on your progress. Your doctor or health care professional may order blood tests and other tests to see how you are doing. Call your doctor or health care professional if you get a cold or other infection while receiving this medicine. Do not treat yourself. This medicine may decrease your body's ability to fight infection. You should make sure you get enough calcium and vitamin D while you are taking this medicine, unless your doctor tells you not to. Discuss the foods you eat and the vitamins you take with your health care professional. See your dentist regularly. Brush and floss your teeth as directed. Before you have any dental work done, tell your dentist you are receiving this medicine. Do not become pregnant while taking this medicine or for 5 months after stopping   it. Women should inform their doctor if they wish to become pregnant or think they might be pregnant. There is a potential for serious side effects to an unborn child. Talk to your health care professional or pharmacist for more  information. What side effects may I notice from receiving this medicine? Side effects that you should report to your doctor or health care professional as soon as possible: -allergic reactions like skin rash, itching or hives, swelling of the face, lips, or tongue -breathing problems -chest pain -fast, irregular heartbeat -feeling faint or lightheaded, falls -fever, chills, or any other sign of infection -muscle spasms, tightening, or twitches -numbness or tingling -skin blisters or bumps, or is dry, peels, or red -slow healing or unexplained pain in the mouth or jaw -unusual bleeding or bruising Side effects that usually do not require medical attention (Report these to your doctor or health care professional if they continue or are bothersome.): -muscle pain -stomach upset, gas This list may not describe all possible side effects. Call your doctor for medical advice about side effects. You may report side effects to FDA at 1-800-FDA-1088. Where should I keep my medicine? This medicine is only given in a clinic, doctor's office, or other health care setting and will not be stored at home. NOTE: This sheet is a summary. It may not cover all possible information. If you have questions about this medicine, talk to your doctor, pharmacist, or health care provider.  2015, Elsevier/Gold Standard. (2011-07-13 12:37:47) Goserelin injection What is this medicine? GOSERELIN (GOE se rel in) is similar to a hormone found in the body. It lowers the amount of sex hormones that the body makes. Men will have lower testosterone levels and women will have lower estrogen levels while taking this medicine. In men, this medicine is used to treat prostate cancer; the injection is either given once per month or once every 12 weeks. A once per month injection (only) is used to treat women with endometriosis, dysfunctional uterine bleeding, or advanced breast cancer. This medicine may be used for other purposes;  ask your health care provider or pharmacist if you have questions. COMMON BRAND NAME(S): Zoladex What should I tell my health care provider before I take this medicine? They need to know if you have any of these conditions (some only apply to women): -diabetes -heart disease or previous heart attack -high blood pressure -high cholesterol -kidney disease -osteoporosis or low bone density -problems passing urine -spinal cord injury -stroke -tobacco smoker -an unusual or allergic reaction to goserelin, hormone therapy, other medicines, foods, dyes, or preservatives -pregnant or trying to get pregnant -breast-feeding How should I use this medicine? This medicine is for injection under the skin. It is given by a health care professional in a hospital or clinic setting. Men receive this injection once every 4 weeks or once every 12 weeks. Women will only receive the once every 4 weeks injection. Talk to your pediatrician regarding the use of this medicine in children. Special care may be needed. Overdosage: If you think you have taken too much of this medicine contact a poison control center or emergency room at once. NOTE: This medicine is only for you. Do not share this medicine with others. What if I miss a dose? It is important not to miss your dose. Call your doctor or health care professional if you are unable to keep an appointment. What may interact with this medicine? -female hormones like estrogen -herbal or dietary supplements like black cohosh, chasteberry,   or DHEA -female hormones like testosterone -prasterone This list may not describe all possible interactions. Give your health care provider a list of all the medicines, herbs, non-prescription drugs, or dietary supplements you use. Also tell them if you smoke, drink alcohol, or use illegal drugs. Some items may interact with your medicine. What should I watch for while using this medicine? Visit your doctor or health care  professional for regular checks on your progress. Your symptoms may appear to get worse during the first weeks of this therapy. Tell your doctor or healthcare professional if your symptoms do not start to get better or if they get worse after this time. Your bones may get weaker if you take this medicine for a long time. If you smoke or frequently drink alcohol you may increase your risk of bone loss. A family history of osteoporosis, chronic use of drugs for seizures (convulsions), or corticosteroids can also increase your risk of bone loss. Talk to your doctor about how to keep your bones strong. This medicine should stop regular monthly menstration in women. Tell your doctor if you continue to menstrate. Women should not become pregnant while taking this medicine or for 12 weeks after stopping this medicine. Women should inform their doctor if they wish to become pregnant or think they might be pregnant. There is a potential for serious side effects to an unborn child. Talk to your health care professional or pharmacist for more information. Do not breast-feed an infant while taking this medicine. Men should inform their doctors if they wish to father a child. This medicine may lower sperm counts. Talk to your health care professional or pharmacist for more information. What side effects may I notice from receiving this medicine? Side effects that you should report to your doctor or health care professional as soon as possible: -allergic reactions like skin rash, itching or hives, swelling of the face, lips, or tongue -bone pain -breathing problems -changes in vision -chest pain -feeling faint or lightheaded, falls -fever, chills -pain, swelling, warmth in the leg -pain, tingling, numbness in the hands or feet -signs and symptoms of low blood pressure like dizziness; feeling faint or lightheaded, falls; unusually weak or tired -stomach pain -swelling of the ankles, feet, hands -trouble passing  urine or change in the amount of urine -unusually high or low blood pressure -unusually weak or tired Side effects that usually do not require medical attention (report to your doctor or health care professional if they continue or are bothersome): -change in sex drive or performance -changes in breast size in both males and females -changes in emotions or moods -headache -hot flashes -irritation at site where injected -loss of appetite -skin problems like acne, dry skin -vaginal dryness This list may not describe all possible side effects. Call your doctor for medical advice about side effects. You may report side effects to FDA at 1-800-FDA-1088. Where should I keep my medicine? This drug is given in a hospital or clinic and will not be stored at home. NOTE: This sheet is a summary. It may not cover all possible information. If you have questions about this medicine, talk to your doctor, pharmacist, or health care provider.  2015, Elsevier/Gold Standard. (2013-03-21 11:10:35)  

## 2013-08-27 ENCOUNTER — Other Ambulatory Visit: Payer: Self-pay | Admitting: Oncology

## 2013-08-28 ENCOUNTER — Encounter (HOSPITAL_COMMUNITY): Payer: Self-pay | Admitting: Radiology

## 2013-09-08 ENCOUNTER — Other Ambulatory Visit (HOSPITAL_BASED_OUTPATIENT_CLINIC_OR_DEPARTMENT_OTHER): Payer: BC Managed Care – PPO

## 2013-09-08 DIAGNOSIS — G629 Polyneuropathy, unspecified: Secondary | ICD-10-CM

## 2013-09-08 DIAGNOSIS — C50419 Malignant neoplasm of upper-outer quadrant of unspecified female breast: Secondary | ICD-10-CM

## 2013-09-08 DIAGNOSIS — M25559 Pain in unspecified hip: Secondary | ICD-10-CM

## 2013-09-08 LAB — CBC WITH DIFFERENTIAL/PLATELET
BASO%: 1.3 % (ref 0.0–2.0)
Basophils Absolute: 0.1 10*3/uL (ref 0.0–0.1)
EOS%: 0.5 % (ref 0.0–7.0)
Eosinophils Absolute: 0 10*3/uL (ref 0.0–0.5)
HCT: 35.4 % (ref 34.8–46.6)
HGB: 12.2 g/dL (ref 11.6–15.9)
LYMPH#: 1.7 10*3/uL (ref 0.9–3.3)
LYMPH%: 42.9 % (ref 14.0–49.7)
MCH: 34 pg (ref 25.1–34.0)
MCHC: 34.5 g/dL (ref 31.5–36.0)
MCV: 98.5 fL (ref 79.5–101.0)
MONO#: 0.3 10*3/uL (ref 0.1–0.9)
MONO%: 9 % (ref 0.0–14.0)
NEUT%: 46.3 % (ref 38.4–76.8)
NEUTROS ABS: 1.8 10*3/uL (ref 1.5–6.5)
Platelets: 269 10*3/uL (ref 145–400)
RBC: 3.59 10*6/uL — ABNORMAL LOW (ref 3.70–5.45)
RDW: 18 % — AB (ref 11.2–14.5)
WBC: 3.8 10*3/uL — AB (ref 3.9–10.3)

## 2013-09-12 ENCOUNTER — Ambulatory Visit: Payer: BC Managed Care – PPO | Admitting: Internal Medicine

## 2013-09-12 ENCOUNTER — Encounter: Payer: Self-pay | Admitting: Internal Medicine

## 2013-09-12 VITALS — BP 161/75 | HR 63 | Temp 98.1°F | Resp 18 | Ht 62.0 in | Wt 236.0 lb

## 2013-09-12 DIAGNOSIS — Z1211 Encounter for screening for malignant neoplasm of colon: Secondary | ICD-10-CM

## 2013-09-12 DIAGNOSIS — D126 Benign neoplasm of colon, unspecified: Secondary | ICD-10-CM

## 2013-09-12 MED ORDER — SODIUM CHLORIDE 0.9 % IV SOLN: 500.0000 mL | INTRAVENOUS | Status: DC

## 2013-09-12 NOTE — Progress Notes (Signed)
A/ox3, pleased with MAC, report to RN 

## 2013-09-12 NOTE — Progress Notes (Signed)
Called to room to assist during endoscopic procedure.  Patient ID and intended procedure confirmed with present staff. Received instructions for my participation in the procedure from the performing physician.  

## 2013-09-12 NOTE — Patient Instructions (Addendum)
I found and removed 4 polyps - they all look benign but some or all are probably pre-cancerous. This means you will likely get a recommendation to repeat a colonoscopy in 3-5 years.  I will let you know pathology results and when to have another routine colonoscopy by mail.  I appreciate the opportunity to care for you. Gatha Mayer, MD, FACG  YOU HAD AN ENDOSCOPIC PROCEDURE TODAY AT [facility]: Refer to the procedure report that was given to you for any specific questions about what was found during the examination.  If the procedure report does not answer your questions, please call your gastroenterologist to clarify.  If you requested that your care partner not be given the details of your procedure findings, then the procedure report has been included in a sealed envelope for you to review at your convenience later.  YOU SHOULD EXPECT: Some feelings of bloating in the abdomen. Passage of more gas than usual.  Walking can help get rid of the air that was put into your GI tract during the procedure and reduce the bloating. If you had a lower endoscopy (such as a colonoscopy or flexible sigmoidoscopy) you may notice spotting of blood in your stool or on the toilet paper. If you underwent a bowel prep for your procedure, then you may not have a normal bowel movement for a few days.  DIET: Your first meal following the procedure should be a light meal and then it is ok to progress to your normal diet.  A half-sandwich or bowl of soup is an example of a good first meal.  Heavy or fried foods are harder to digest and may make you feel nauseous or bloated.  Likewise meals heavy in dairy and vegetables can cause extra gas to form and this can also increase the bloating.  Drink plenty of fluids but you should avoid alcoholic beverages for 24 hours.  ACTIVITY: Your care partner should take you home directly after the procedure.  You should plan to take it easy, moving slowly for the rest  of the day.  You can resume normal activity the day after the procedure however you should NOT DRIVE or use heavy machinery for 24 hours (because of the sedation medicines used during the test).    SYMPTOMS TO REPORT IMMEDIATELY: A gastroenterologist can be reached at any hour.  During normal business hours, 8:30 AM to 5:00 PM Monday through Friday, call [phone].  After hours and on weekends, please call the GI answering service at [phone] who will take a message and have the physician on call contact you.   Following lower endoscopy (colonoscopy or flexible sigmoidoscopy):  Excessive amounts of blood in the stool  Significant tenderness or worsening of abdominal pains  Swelling of the abdomen that is new, acute  Fever of 100F or higher  Following upper endoscopy (EGD)  Vomiting of blood or coffee ground material  New chest pain or pain under the shoulder blades  Painful or persistently difficult swallowing  New shortness of breath  Fever of 100F or higher  Black, tarry-looking stools  FOLLOW UP: If any biopsies were taken you will be contacted by phone or by letter within the next 1-3 weeks.  Call your gastroenterologist if you have not heard about the biopsies in 3 weeks.  Our staff will call the home number listed on your records the next business day following your procedure to check on you and address any questions or concerns  that you may have at that time regarding the information given to you following your procedure. This is a courtesy call and so if there is no answer at the home number and we have not heard from you through the emergency physician on call, we will assume that you have returned to your regular daily activities without incident.  SIGNATURES/CONFIDENTIALITY: You and/or your care partner have signed paperwork which will be entered into your electronic medical record.  These signatures attest to the fact that that the information above on your After  Visit Summary has been reviewed and is understood.  Full responsibility of the confidentiality of this discharge information lies with you and/or your care-partner.  Polyp information given.

## 2013-09-12 NOTE — Op Note (Signed)
[facility]  [address] [place], [id number]   COLONOSCOPY PROCEDURE REPORT  PATIENT: Cheryl Moore, Cheryl Moore  MR#: [id number] BIRTHDATE: 1960-05-02 , 53  yrs. old GENDER: Female ENDOSCOPIST: Gatha Mayer, MD, [facility] PROCEDURE DATE:  09/12/2013 PROCEDURE:   Colonoscopy with snare polypectomy First Screening Colonoscopy - Avg.  risk and is 50 yrs.  old or older Yes.  Prior Negative Screening - Now for repeat screening. N/A  History of Adenoma - Now for follow-up colonoscopy & has been > or = to 3 yrs.  N/A  Polyps Removed Today? Yes. ASA CLASS:   Class III INDICATIONS:average risk screening and first colonoscopy. MEDICATIONS: propofol (Diprivan) 450mg  IV, MAC sedation, administered by CRNA, and These medications were titrated to patient response per physician's verbal order  DESCRIPTION OF PROCEDURE:   After the risks benefits and alternatives of the procedure were thoroughly explained, informed consent was obtained.  A digital rectal exam revealed no abnormalities of the rectum.   The LB [id number]  endoscope was introduced through the anus and advanced to the cecum, which was identified by both the appendix and ileocecal valve. No adverse events experienced.   The quality of the prep was excellent using Suprep  The instrument was then slowly withdrawn as the colon was fully examined.  COLON FINDINGS: Four sessile polyps measuring 5-8 mm in size were found in the transverse colon and descending colon.  A polypectomy was performed with a cold snare.  The resection was complete and the polyp tissue was completely retrieved.   The colon mucosa was otherwise normal.   A right colon retroflexion was performed. Retroflexed views revealed no abnormalities. The time to cecum=2 minutes 58 seconds.  Withdrawal time=12 minutes 0 seconds.  The scope was withdrawn and the procedure completed. COMPLICATIONS: There were no complications.  ENDOSCOPIC IMPRESSION: 1.   Four  sessile polyps measuring 5-8 mm in size were found in the transverse colon and descending colon; polypectomy was performed with a cold snare 2.   The colon mucosa was otherwise normal - excellent prep - first colonoscopy  RECOMMENDATIONS: Timing of repeat colonoscopy will be determined by pathology findings.   eSigned:  Gatha Mayer, MD, [facility] 09/12/2013 3:22 PM   cc: Lurline Del, MD and The Patient

## 2013-09-13 ENCOUNTER — Telehealth: Payer: Self-pay | Admitting: *Deleted

## 2013-09-13 NOTE — Telephone Encounter (Signed)
Message left

## 2013-09-15 ENCOUNTER — Other Ambulatory Visit: Payer: Self-pay | Admitting: *Deleted

## 2013-09-15 DIAGNOSIS — C50411 Malignant neoplasm of upper-outer quadrant of right female breast: Secondary | ICD-10-CM

## 2013-09-15 MED ORDER — GABAPENTIN 100 MG PO CAPS: 100.0000 mg | ORAL_CAPSULE | Freq: Three times a day (TID) | ORAL | Status: DC

## 2013-09-19 ENCOUNTER — Encounter: Payer: Self-pay | Admitting: Internal Medicine

## 2013-09-19 DIAGNOSIS — Z860100 Personal history of colon polyps, unspecified: Secondary | ICD-10-CM

## 2013-09-19 HISTORY — DX: Personal history of colonic polyps: Z86.010

## 2013-09-19 HISTORY — DX: Personal history of colon polyps, unspecified: Z86.0100

## 2013-09-19 NOTE — Progress Notes (Signed)
Quick Note:  ssp and 2 tub adenomas max 8 mm - repeat colonoscopy 2018 ______

## 2013-09-21 ENCOUNTER — Encounter (INDEPENDENT_AMBULATORY_CARE_PROVIDER_SITE_OTHER): Payer: Self-pay | Admitting: General Surgery

## 2013-09-21 ENCOUNTER — Ambulatory Visit (INDEPENDENT_AMBULATORY_CARE_PROVIDER_SITE_OTHER): Payer: BC Managed Care – PPO | Admitting: General Surgery

## 2013-09-21 ENCOUNTER — Telehealth (INDEPENDENT_AMBULATORY_CARE_PROVIDER_SITE_OTHER): Payer: Self-pay

## 2013-09-21 VITALS — BP 128/76 | HR 77 | Temp 97.0°F | Ht 62.0 in | Wt 241.0 lb

## 2013-09-21 DIAGNOSIS — C50419 Malignant neoplasm of upper-outer quadrant of unspecified female breast: Secondary | ICD-10-CM

## 2013-09-21 DIAGNOSIS — C50411 Malignant neoplasm of upper-outer quadrant of right female breast: Secondary | ICD-10-CM

## 2013-09-21 NOTE — Telephone Encounter (Signed)
Pt called stating she noticed area at last appt that was a raised white bump. Per instruction from Dr Donne Hazel pt put antibiotic ointment on area and covered with bandaid. Pt states white bump gone but she is seeing plastic coming thru skin. Pt given appt for this afternoon to have area checked.

## 2013-09-21 NOTE — Progress Notes (Signed)
Subjective:     Patient ID: Cheryl Moore, female   DOB: Jan 28, 1960, 53 y.o.   MRN: [id number]  HPI 41 yof known to me with breast cancer comes in for urgent visit today after noting a possible stitch or opening at port site.  She noted this recently.  She picked off the scab and notes a hard area there. No drainage, no infection.  Review of Systems     Objective:   Physical Exam Healed incision, small 1 mm opening with ? Some stitch material, there is no infection, no drainage    Assessment:     Breast cancer      Plan:     I think this just had a scab that was removed.  I asked her to keep an eye on it and cover with neosporin and a band aid and call me back if doesn't improve or worsens.

## 2013-09-22 ENCOUNTER — Ambulatory Visit (HOSPITAL_BASED_OUTPATIENT_CLINIC_OR_DEPARTMENT_OTHER): Payer: BC Managed Care – PPO

## 2013-09-22 ENCOUNTER — Other Ambulatory Visit (HOSPITAL_BASED_OUTPATIENT_CLINIC_OR_DEPARTMENT_OTHER): Payer: BC Managed Care – PPO

## 2013-09-22 VITALS — BP 138/68 | HR 64 | Temp 97.6°F

## 2013-09-22 DIAGNOSIS — G629 Polyneuropathy, unspecified: Secondary | ICD-10-CM

## 2013-09-22 DIAGNOSIS — C7951 Secondary malignant neoplasm of bone: Secondary | ICD-10-CM

## 2013-09-22 DIAGNOSIS — C50411 Malignant neoplasm of upper-outer quadrant of right female breast: Secondary | ICD-10-CM

## 2013-09-22 DIAGNOSIS — M25559 Pain in unspecified hip: Secondary | ICD-10-CM

## 2013-09-22 DIAGNOSIS — C7952 Secondary malignant neoplasm of bone marrow: Secondary | ICD-10-CM

## 2013-09-22 DIAGNOSIS — C773 Secondary and unspecified malignant neoplasm of axilla and upper limb lymph nodes: Secondary | ICD-10-CM

## 2013-09-22 DIAGNOSIS — C50919 Malignant neoplasm of unspecified site of unspecified female breast: Secondary | ICD-10-CM

## 2013-09-22 DIAGNOSIS — C50419 Malignant neoplasm of upper-outer quadrant of unspecified female breast: Secondary | ICD-10-CM

## 2013-09-22 DIAGNOSIS — Z5111 Encounter for antineoplastic chemotherapy: Secondary | ICD-10-CM

## 2013-09-22 LAB — COMPREHENSIVE METABOLIC PANEL (CC13)
ALT: 29 U/L (ref 0–55)
ANION GAP: 9 meq/L (ref 3–11)
AST: 21 U/L (ref 5–34)
Albumin: 3.7 g/dL (ref 3.5–5.0)
Alkaline Phosphatase: 54 U/L (ref 40–150)
BUN: 16.2 mg/dL (ref 7.0–26.0)
CO2: 18 mEq/L — ABNORMAL LOW (ref 22–29)
CREATININE: 0.9 mg/dL (ref 0.6–1.1)
Calcium: 8.9 mg/dL (ref 8.4–10.4)
Chloride: 113 mEq/L — ABNORMAL HIGH (ref 98–109)
Glucose: 102 mg/dl (ref 70–140)
Potassium: 4.3 mEq/L (ref 3.5–5.1)
Sodium: 140 mEq/L (ref 136–145)
Total Bilirubin: 0.39 mg/dL (ref 0.20–1.20)
Total Protein: 6.8 g/dL (ref 6.4–8.3)

## 2013-09-22 LAB — CBC WITH DIFFERENTIAL/PLATELET
BASO%: 0.8 % (ref 0.0–2.0)
Basophils Absolute: 0 10*3/uL (ref 0.0–0.1)
EOS%: 0.8 % (ref 0.0–7.0)
Eosinophils Absolute: 0 10*3/uL (ref 0.0–0.5)
HEMATOCRIT: 34.1 % — AB (ref 34.8–46.6)
HGB: 12 g/dL (ref 11.6–15.9)
LYMPH%: 47 % (ref 14.0–49.7)
MCH: 34.3 pg — AB (ref 25.1–34.0)
MCHC: 35.2 g/dL (ref 31.5–36.0)
MCV: 97.4 fL (ref 79.5–101.0)
MONO#: 0.3 10*3/uL (ref 0.1–0.9)
MONO%: 9.5 % (ref 0.0–14.0)
NEUT#: 1.1 10*3/uL — ABNORMAL LOW (ref 1.5–6.5)
NEUT%: 41.9 % (ref 38.4–76.8)
PLATELETS: 197 10*3/uL (ref 145–400)
RBC: 3.5 10*6/uL — ABNORMAL LOW (ref 3.70–5.45)
RDW: 16.5 % — ABNORMAL HIGH (ref 11.2–14.5)
WBC: 2.6 10*3/uL — ABNORMAL LOW (ref 3.9–10.3)
lymph#: 1.2 10*3/uL (ref 0.9–3.3)

## 2013-09-22 MED ORDER — GOSERELIN ACETATE 3.6 MG ~~LOC~~ IMPL
3.6000 mg | DRUG_IMPLANT | SUBCUTANEOUS | Status: DC
  Administered 2013-09-22: 3.6 mg via SUBCUTANEOUS
  Filled 2013-09-22: qty 3.6

## 2013-09-22 MED ORDER — DENOSUMAB 120 MG/1.7ML ~~LOC~~ SOLN
120.0000 mg | Freq: Once | SUBCUTANEOUS | Status: AC
  Administered 2013-09-22: 120 mg via SUBCUTANEOUS
  Filled 2013-09-22: qty 1.7

## 2013-10-06 ENCOUNTER — Other Ambulatory Visit: Payer: Self-pay | Admitting: *Deleted

## 2013-10-06 ENCOUNTER — Ambulatory Visit (HOSPITAL_BASED_OUTPATIENT_CLINIC_OR_DEPARTMENT_OTHER): Payer: BC Managed Care – PPO | Admitting: Oncology

## 2013-10-06 ENCOUNTER — Ambulatory Visit (HOSPITAL_BASED_OUTPATIENT_CLINIC_OR_DEPARTMENT_OTHER): Payer: BC Managed Care – PPO

## 2013-10-06 ENCOUNTER — Telehealth: Payer: Self-pay | Admitting: Oncology

## 2013-10-06 VITALS — BP 143/66 | HR 64 | Temp 97.7°F | Resp 20 | Ht 62.0 in | Wt 238.9 lb

## 2013-10-06 DIAGNOSIS — C773 Secondary and unspecified malignant neoplasm of axilla and upper limb lymph nodes: Secondary | ICD-10-CM

## 2013-10-06 DIAGNOSIS — C50919 Malignant neoplasm of unspecified site of unspecified female breast: Secondary | ICD-10-CM

## 2013-10-06 DIAGNOSIS — C50411 Malignant neoplasm of upper-outer quadrant of right female breast: Secondary | ICD-10-CM

## 2013-10-06 DIAGNOSIS — C7952 Secondary malignant neoplasm of bone marrow: Secondary | ICD-10-CM

## 2013-10-06 DIAGNOSIS — C7951 Secondary malignant neoplasm of bone: Secondary | ICD-10-CM

## 2013-10-06 DIAGNOSIS — J90 Pleural effusion, not elsewhere classified: Secondary | ICD-10-CM

## 2013-10-06 DIAGNOSIS — Z95828 Presence of other vascular implants and grafts: Secondary | ICD-10-CM

## 2013-10-06 DIAGNOSIS — Z170 Estrogen receptor positive status [ER+]: Secondary | ICD-10-CM

## 2013-10-06 DIAGNOSIS — G589 Mononeuropathy, unspecified: Secondary | ICD-10-CM

## 2013-10-06 MED ORDER — VENLAFAXINE HCL 37.5 MG PO TABS: 37.5000 mg | ORAL_TABLET | Freq: Every day | ORAL | Status: DC

## 2013-10-06 MED ORDER — GABAPENTIN 100 MG PO CAPS: ORAL_CAPSULE | ORAL | Status: DC

## 2013-10-06 MED ORDER — HEPARIN SOD (PORK) LOCK FLUSH 100 UNIT/ML IV SOLN
500.0000 [IU] | Freq: Once | INTRAVENOUS | Status: AC
  Administered 2013-10-06: 500 [IU] via INTRAVENOUS
  Filled 2013-10-06: qty 5

## 2013-10-06 MED ORDER — LORAZEPAM 0.5 MG PO TABS: 0.5000 mg | ORAL_TABLET | Freq: Every day | ORAL | Status: DC | PRN

## 2013-10-06 MED ORDER — PALBOCICLIB 125 MG PO CAPS: 125.0000 mg | ORAL_CAPSULE | ORAL | Status: DC

## 2013-10-06 MED ORDER — TRAMADOL HCL 50 MG PO TABS: 50.0000 mg | ORAL_TABLET | Freq: Four times a day (QID) | ORAL | Status: DC | PRN

## 2013-10-06 MED ORDER — SODIUM CHLORIDE 0.9 % IJ SOLN
10.0000 mL | INTRAMUSCULAR | Status: DC | PRN
  Administered 2013-10-06: 10 mL via INTRAVENOUS
  Filled 2013-10-06: qty 10

## 2013-10-06 MED ORDER — ALPRAZOLAM 0.25 MG PO TABS: 0.2500 mg | ORAL_TABLET | Freq: Every evening | ORAL | Status: DC | PRN

## 2013-10-06 NOTE — Patient Instructions (Signed)

## 2013-10-06 NOTE — Telephone Encounter (Signed)
, °
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
## 2013-10-09 ENCOUNTER — Encounter (HOSPITAL_COMMUNITY): Payer: Self-pay

## 2013-10-10 ENCOUNTER — Other Ambulatory Visit: Payer: Self-pay | Admitting: Emergency Medicine

## 2013-10-10 MED ORDER — PALBOCICLIB 125 MG PO CAPS: 125.0000 mg | ORAL_CAPSULE | ORAL | Status: DC

## 2013-10-13 ENCOUNTER — Encounter: Payer: Self-pay | Admitting: Emergency Medicine

## 2013-10-13 NOTE — Progress Notes (Signed)
Patient is scheduled to see Dr Burney Gauze on 9/21 re arm/wrist problems; last office note faxed to [phone] as requested per patient and Dr Bertis Ruddy office.

## 2013-10-20 ENCOUNTER — Other Ambulatory Visit (HOSPITAL_BASED_OUTPATIENT_CLINIC_OR_DEPARTMENT_OTHER): Payer: BC Managed Care – PPO

## 2013-10-20 ENCOUNTER — Ambulatory Visit (HOSPITAL_BASED_OUTPATIENT_CLINIC_OR_DEPARTMENT_OTHER): Payer: BC Managed Care – PPO

## 2013-10-20 VITALS — BP 119/47 | HR 59 | Temp 98.1°F | Resp 20

## 2013-10-20 DIAGNOSIS — C50411 Malignant neoplasm of upper-outer quadrant of right female breast: Secondary | ICD-10-CM

## 2013-10-20 DIAGNOSIS — C7951 Secondary malignant neoplasm of bone: Secondary | ICD-10-CM

## 2013-10-20 DIAGNOSIS — C50419 Malignant neoplasm of upper-outer quadrant of unspecified female breast: Secondary | ICD-10-CM

## 2013-10-20 DIAGNOSIS — C50919 Malignant neoplasm of unspecified site of unspecified female breast: Secondary | ICD-10-CM

## 2013-10-20 DIAGNOSIS — C7952 Secondary malignant neoplasm of bone marrow: Secondary | ICD-10-CM

## 2013-10-20 DIAGNOSIS — Z5111 Encounter for antineoplastic chemotherapy: Secondary | ICD-10-CM

## 2013-10-20 DIAGNOSIS — C773 Secondary and unspecified malignant neoplasm of axilla and upper limb lymph nodes: Secondary | ICD-10-CM

## 2013-10-20 DIAGNOSIS — M25559 Pain in unspecified hip: Secondary | ICD-10-CM

## 2013-10-20 DIAGNOSIS — G629 Polyneuropathy, unspecified: Secondary | ICD-10-CM

## 2013-10-20 LAB — CBC WITH DIFFERENTIAL/PLATELET
BASO%: 0.4 % (ref 0.0–2.0)
BASOS ABS: 0 10*3/uL (ref 0.0–0.1)
EOS%: 1.1 % (ref 0.0–7.0)
Eosinophils Absolute: 0 10*3/uL (ref 0.0–0.5)
HCT: 35.4 % (ref 34.8–46.6)
HGB: 12.5 g/dL (ref 11.6–15.9)
LYMPH%: 47.2 % (ref 14.0–49.7)
MCH: 35.3 pg — ABNORMAL HIGH (ref 25.1–34.0)
MCHC: 35.3 g/dL (ref 31.5–36.0)
MCV: 100 fL (ref 79.5–101.0)
MONO#: 0.2 10*3/uL (ref 0.1–0.9)
MONO%: 8.9 % (ref 0.0–14.0)
NEUT#: 1.2 10*3/uL — ABNORMAL LOW (ref 1.5–6.5)
NEUT%: 42.4 % (ref 38.4–76.8)
PLATELETS: 193 10*3/uL (ref 145–400)
RBC: 3.54 10*6/uL — AB (ref 3.70–5.45)
RDW: 14.9 % — AB (ref 11.2–14.5)
WBC: 2.7 10*3/uL — AB (ref 3.9–10.3)
lymph#: 1.3 10*3/uL (ref 0.9–3.3)

## 2013-10-20 LAB — COMPREHENSIVE METABOLIC PANEL (CC13)
ALBUMIN: 3.8 g/dL (ref 3.5–5.0)
ALK PHOS: 54 U/L (ref 40–150)
ALT: 22 U/L (ref 0–55)
AST: 19 U/L (ref 5–34)
Anion Gap: 10 mEq/L (ref 3–11)
BUN: 16.6 mg/dL (ref 7.0–26.0)
CALCIUM: 9.6 mg/dL (ref 8.4–10.4)
CO2: 22 mEq/L (ref 22–29)
Chloride: 106 mEq/L (ref 98–109)
Creatinine: 1.1 mg/dL (ref 0.6–1.1)
Glucose: 171 mg/dl — ABNORMAL HIGH (ref 70–140)
POTASSIUM: 4 meq/L (ref 3.5–5.1)
Sodium: 139 mEq/L (ref 136–145)
Total Bilirubin: 0.37 mg/dL (ref 0.20–1.20)
Total Protein: 6.7 g/dL (ref 6.4–8.3)

## 2013-10-20 MED ORDER — DENOSUMAB 120 MG/1.7ML ~~LOC~~ SOLN
120.0000 mg | Freq: Once | SUBCUTANEOUS | Status: AC
  Administered 2013-10-20: 120 mg via SUBCUTANEOUS
  Filled 2013-10-20: qty 1.7

## 2013-10-20 MED ORDER — GOSERELIN ACETATE 3.6 MG ~~LOC~~ IMPL
3.6000 mg | DRUG_IMPLANT | SUBCUTANEOUS | Status: DC
  Administered 2013-10-20: 3.6 mg via SUBCUTANEOUS
  Filled 2013-10-20: qty 3.6

## 2013-10-30 ENCOUNTER — Other Ambulatory Visit: Payer: Self-pay | Admitting: Emergency Medicine

## 2013-10-30 MED ORDER — VENLAFAXINE HCL 75 MG PO TABS: 75.0000 mg | ORAL_TABLET | Freq: Every day | ORAL | Status: DC

## 2013-11-09 ENCOUNTER — Encounter: Payer: Self-pay | Admitting: *Deleted

## 2013-11-17 ENCOUNTER — Other Ambulatory Visit: Payer: BC Managed Care – PPO

## 2013-11-17 ENCOUNTER — Ambulatory Visit (HOSPITAL_BASED_OUTPATIENT_CLINIC_OR_DEPARTMENT_OTHER): Payer: BC Managed Care – PPO

## 2013-11-17 ENCOUNTER — Other Ambulatory Visit (HOSPITAL_BASED_OUTPATIENT_CLINIC_OR_DEPARTMENT_OTHER): Payer: BC Managed Care – PPO

## 2013-11-17 ENCOUNTER — Ambulatory Visit: Payer: BC Managed Care – PPO

## 2013-11-17 VITALS — BP 111/94 | HR 69

## 2013-11-17 DIAGNOSIS — C50411 Malignant neoplasm of upper-outer quadrant of right female breast: Secondary | ICD-10-CM

## 2013-11-17 DIAGNOSIS — C50919 Malignant neoplasm of unspecified site of unspecified female breast: Secondary | ICD-10-CM

## 2013-11-17 DIAGNOSIS — G629 Polyneuropathy, unspecified: Secondary | ICD-10-CM

## 2013-11-17 DIAGNOSIS — Z5111 Encounter for antineoplastic chemotherapy: Secondary | ICD-10-CM

## 2013-11-17 DIAGNOSIS — C7951 Secondary malignant neoplasm of bone: Secondary | ICD-10-CM

## 2013-11-17 DIAGNOSIS — C50419 Malignant neoplasm of upper-outer quadrant of unspecified female breast: Secondary | ICD-10-CM

## 2013-11-17 DIAGNOSIS — M25559 Pain in unspecified hip: Secondary | ICD-10-CM

## 2013-11-17 LAB — COMPREHENSIVE METABOLIC PANEL (CC13)
ALBUMIN: 3.8 g/dL (ref 3.5–5.0)
ALT: 28 U/L (ref 0–55)
ANION GAP: 10 meq/L (ref 3–11)
AST: 18 U/L (ref 5–34)
Alkaline Phosphatase: 58 U/L (ref 40–150)
BUN: 16.3 mg/dL (ref 7.0–26.0)
CHLORIDE: 108 meq/L (ref 98–109)
CO2: 20 mEq/L — ABNORMAL LOW (ref 22–29)
Calcium: 9.3 mg/dL (ref 8.4–10.4)
Creatinine: 0.9 mg/dL (ref 0.6–1.1)
GLUCOSE: 100 mg/dL (ref 70–140)
POTASSIUM: 4.1 meq/L (ref 3.5–5.1)
Sodium: 138 mEq/L (ref 136–145)
Total Bilirubin: 0.36 mg/dL (ref 0.20–1.20)
Total Protein: 6.6 g/dL (ref 6.4–8.3)

## 2013-11-17 LAB — CBC WITH DIFFERENTIAL/PLATELET
BASO%: 0.7 % (ref 0.0–2.0)
BASOS ABS: 0 10*3/uL (ref 0.0–0.1)
EOS%: 0.7 % (ref 0.0–7.0)
Eosinophils Absolute: 0 10*3/uL (ref 0.0–0.5)
HEMATOCRIT: 36.1 % (ref 34.8–46.6)
HGB: 12.8 g/dL (ref 11.6–15.9)
LYMPH%: 46.9 % (ref 14.0–49.7)
MCH: 35.9 pg — ABNORMAL HIGH (ref 25.1–34.0)
MCHC: 35.5 g/dL (ref 31.5–36.0)
MCV: 101.1 fL — AB (ref 79.5–101.0)
MONO#: 0.3 10*3/uL (ref 0.1–0.9)
MONO%: 10.3 % (ref 0.0–14.0)
NEUT#: 1.1 10*3/uL — ABNORMAL LOW (ref 1.5–6.5)
NEUT%: 41.4 % (ref 38.4–76.8)
PLATELETS: 211 10*3/uL (ref 145–400)
RBC: 3.57 10*6/uL — ABNORMAL LOW (ref 3.70–5.45)
RDW: 13.7 % (ref 11.2–14.5)
WBC: 2.7 10*3/uL — AB (ref 3.9–10.3)
lymph#: 1.3 10*3/uL (ref 0.9–3.3)

## 2013-11-17 MED ORDER — GOSERELIN ACETATE 3.6 MG ~~LOC~~ IMPL
3.6000 mg | DRUG_IMPLANT | SUBCUTANEOUS | Status: DC
  Administered 2013-11-17: 3.6 mg via SUBCUTANEOUS
  Filled 2013-11-17: qty 3.6

## 2013-11-17 MED ORDER — DENOSUMAB 120 MG/1.7ML ~~LOC~~ SOLN
120.0000 mg | Freq: Once | SUBCUTANEOUS | Status: AC
  Administered 2013-11-17: 120 mg via SUBCUTANEOUS
  Filled 2013-11-17: qty 1.7

## 2013-11-17 NOTE — Patient Instructions (Signed)
Goserelin injection What is this medicine? GOSERELIN (GOE se rel in) is similar to a hormone found in the body. It lowers the amount of sex hormones that the body makes. Men will have lower testosterone levels and women will have lower estrogen levels while taking this medicine. In men, this medicine is used to treat prostate cancer; the injection is either given once per month or once every 12 weeks. A once per month injection (only) is used to treat women with endometriosis, dysfunctional uterine bleeding, or advanced breast cancer. This medicine may be used for other purposes; ask your health care provider or pharmacist if you have questions. COMMON BRAND NAME(S): Zoladex What should I tell my health care provider before I take this medicine? They need to know if you have any of these conditions (some only apply to women): -diabetes -heart disease or previous heart attack -high blood pressure -high cholesterol -kidney disease -osteoporosis or low bone density -problems passing urine -spinal cord injury -stroke -tobacco smoker -an unusual or allergic reaction to goserelin, hormone therapy, other medicines, foods, dyes, or preservatives -pregnant or trying to get pregnant -breast-feeding How should I use this medicine? This medicine is for injection under the skin. It is given by a health care professional in a hospital or clinic setting. Men receive this injection once every 4 weeks or once every 12 weeks. Women will only receive the once every 4 weeks injection. Talk to your pediatrician regarding the use of this medicine in children. Special care may be needed. Overdosage: If you think you have taken too much of this medicine contact a poison control center or emergency room at once. NOTE: This medicine is only for you. Do not share this medicine with others. What if I miss a dose? It is important not to miss your dose. Call your doctor or health care professional if you are unable to  keep an appointment. What may interact with this medicine? -female hormones like estrogen -herbal or dietary supplements like black cohosh, chasteberry, or DHEA -female hormones like testosterone -prasterone This list may not describe all possible interactions. Give your health care provider a list of all the medicines, herbs, non-prescription drugs, or dietary supplements you use. Also tell them if you smoke, drink alcohol, or use illegal drugs. Some items may interact with your medicine. What should I watch for while using this medicine? Visit your doctor or health care professional for regular checks on your progress. Your symptoms may appear to get worse during the first weeks of this therapy. Tell your doctor or healthcare professional if your symptoms do not start to get better or if they get worse after this time. Your bones may get weaker if you take this medicine for a long time. If you smoke or frequently drink alcohol you may increase your risk of bone loss. A family history of osteoporosis, chronic use of drugs for seizures (convulsions), or corticosteroids can also increase your risk of bone loss. Talk to your doctor about how to keep your bones strong. This medicine should stop regular monthly menstration in women. Tell your doctor if you continue to menstrate. Women should not become pregnant while taking this medicine or for 12 weeks after stopping this medicine. Women should inform their doctor if they wish to become pregnant or think they might be pregnant. There is a potential for serious side effects to an unborn child. Talk to your health care professional or pharmacist for more information. Do not breast-feed an infant while taking   this medicine. Men should inform their doctors if they wish to father a child. This medicine may lower sperm counts. Talk to your health care professional or pharmacist for more information. What side effects may I notice from receiving this  medicine? Side effects that you should report to your doctor or health care professional as soon as possible: -allergic reactions like skin rash, itching or hives, swelling of the face, lips, or tongue -bone pain -breathing problems -changes in vision -chest pain -feeling faint or lightheaded, falls -fever, chills -pain, swelling, warmth in the leg -pain, tingling, numbness in the hands or feet -signs and symptoms of low blood pressure like dizziness; feeling faint or lightheaded, falls; unusually weak or tired -stomach pain -swelling of the ankles, feet, hands -trouble passing urine or change in the amount of urine -unusually high or low blood pressure -unusually weak or tired Side effects that usually do not require medical attention (report to your doctor or health care professional if they continue or are bothersome): -change in sex drive or performance -changes in breast size in both males and females -changes in emotions or moods -headache -hot flashes -irritation at site where injected -loss of appetite -skin problems like acne, dry skin -vaginal dryness This list may not describe all possible side effects. Call your doctor for medical advice about side effects. You may report side effects to FDA at 1-800-FDA-1088. Where should I keep my medicine? This drug is given in a hospital or clinic and will not be stored at home. NOTE: This sheet is a summary. It may not cover all possible information. If you have questions about this medicine, talk to your doctor, pharmacist, or health care provider.  2015, [employer]. (2013-03-21 11:10:35) Denosumab injection What is this medicine? DENOSUMAB (den oh sue mab) slows bone breakdown. Prolia is used to treat osteoporosis in women after menopause and in men. Delton See is used to prevent bone fractures and other bone problems caused by cancer bone metastases. Delton See is also used to treat giant cell tumor of the bone. This medicine  may be used for other purposes; ask your health care provider or pharmacist if you have questions. COMMON BRAND NAME(S): Prolia, XGEVA What should I tell my health care provider before I take this medicine? They need to know if you have any of these conditions: -dental disease -eczema -infection or history of infections -kidney disease or on dialysis -low blood calcium or vitamin D -malabsorption syndrome -scheduled to have surgery or tooth extraction -taking medicine that contains denosumab -thyroid or parathyroid disease -an unusual reaction to denosumab, other medicines, foods, dyes, or preservatives -pregnant or trying to get pregnant -breast-feeding How should I use this medicine? This medicine is for injection under the skin. It is given by a health care professional in a hospital or clinic setting. If you are getting Prolia, a special MedGuide will be given to you by the pharmacist with each prescription and refill. Be sure to read this information carefully each time. For Prolia, talk to your pediatrician regarding the use of this medicine in children. Special care may be needed. For Delton See, talk to your pediatrician regarding the use of this medicine in children. While this drug may be prescribed for children as young as 13 years for selected conditions, precautions do apply. Overdosage: If you think you've taken too much of this medicine contact a poison control center or emergency room at once. Overdosage: If you think you have taken too much of this medicine contact a  poison control center or emergency room at once. NOTE: This medicine is only for you. Do not share this medicine with others. What if I miss a dose? It is important not to miss your dose. Call your doctor or health care professional if you are unable to keep an appointment. What may interact with this medicine? Do not take this medicine with any of the following medications: -other medicines containing  denosumab This medicine may also interact with the following medications: -medicines that suppress the immune system -medicines that treat cancer -steroid medicines like prednisone or cortisone This list may not describe all possible interactions. Give your health care provider a list of all the medicines, herbs, non-prescription drugs, or dietary supplements you use. Also tell them if you smoke, drink alcohol, or use illegal drugs. Some items may interact with your medicine. What should I watch for while using this medicine? Visit your doctor or health care professional for regular checks on your progress. Your doctor or health care professional may order blood tests and other tests to see how you are doing. Call your doctor or health care professional if you get a cold or other infection while receiving this medicine. Do not treat yourself. This medicine may decrease your body's ability to fight infection. You should make sure you get enough calcium and vitamin D while you are taking this medicine, unless your doctor tells you not to. Discuss the foods you eat and the vitamins you take with your health care professional. See your dentist regularly. Brush and floss your teeth as directed. Before you have any dental work done, tell your dentist you are receiving this medicine. Do not become pregnant while taking this medicine or for 5 months after stopping it. Women should inform their doctor if they wish to become pregnant or think they might be pregnant. There is a potential for serious side effects to an unborn child. Talk to your health care professional or pharmacist for more information. What side effects may I notice from receiving this medicine? Side effects that you should report to your doctor or health care professional as soon as possible: -allergic reactions like skin rash, itching or hives, swelling of the face, lips, or tongue -breathing problems -chest pain -fast, irregular  heartbeat -feeling faint or lightheaded, falls -fever, chills, or any other sign of infection -muscle spasms, tightening, or twitches -numbness or tingling -skin blisters or bumps, or is dry, peels, or red -slow healing or unexplained pain in the mouth or jaw -unusual bleeding or bruising Side effects that usually do not require medical attention (Report these to your doctor or health care professional if they continue or are bothersome.): -muscle pain -stomach upset, gas This list may not describe all possible side effects. Call your doctor for medical advice about side effects. You may report side effects to FDA at 1-800-FDA-1088. Where should I keep my medicine? This medicine is only given in a clinic, doctor's office, or other health care setting and will not be stored at home. NOTE: This sheet is a summary. It may not cover all possible information. If you have questions about this medicine, talk to your doctor, pharmacist, or health care provider.  2015, [employer]. (2011-07-13 12:37:47)

## 2013-11-20 ENCOUNTER — Telehealth: Payer: Self-pay | Admitting: Obstetrics and Gynecology

## 2013-11-20 NOTE — Telephone Encounter (Signed)
lmtcb to reschedule aex with Dr. Quincy Simmonds from 05/07/14

## 2013-11-24 ENCOUNTER — Other Ambulatory Visit: Payer: Self-pay | Admitting: Adult Health

## 2013-11-27 ENCOUNTER — Encounter (INDEPENDENT_AMBULATORY_CARE_PROVIDER_SITE_OTHER): Payer: Self-pay | Admitting: General Surgery

## 2013-11-27 ENCOUNTER — Encounter: Payer: Self-pay | Admitting: *Deleted

## 2013-11-27 NOTE — Progress Notes (Signed)
RECEIVED A FAX FROM [facility]ERNING A CONFIRMATION OF PRESCRIPTION SHIPMENT FOR Cheryl Moore ON 11/24/13.

## 2013-12-05 ENCOUNTER — Encounter (HOSPITAL_COMMUNITY)
Admission: RE | Admit: 2013-12-05 | Discharge: 2013-12-05 | Disposition: A | Discharge status: Home / Self Care | Payer: BC Managed Care – PPO | Source: Ambulatory Visit | Attending: Diagnostic Radiology | Admitting: Diagnostic Radiology

## 2013-12-05 DIAGNOSIS — C50411 Malignant neoplasm of upper-outer quadrant of right female breast: Secondary | ICD-10-CM | POA: Insufficient documentation

## 2013-12-05 DIAGNOSIS — C7951 Secondary malignant neoplasm of bone: Secondary | ICD-10-CM | POA: Insufficient documentation

## 2013-12-05 LAB — GLUCOSE, CAPILLARY: GLUCOSE-CAPILLARY: 100 mg/dL — AB (ref 70–99)

## 2013-12-05 MED ORDER — FLUDEOXYGLUCOSE F - 18 (FDG) INJECTION
11.5000 | Freq: Once | INTRAVENOUS | Status: AC | PRN
  Administered 2013-12-05: 11.5 via INTRAVENOUS

## 2013-12-06 ENCOUNTER — Ambulatory Visit (HOSPITAL_COMMUNITY): Admission: RE | Admit: 2013-12-06 | Payer: BC Managed Care – PPO | Source: Ambulatory Visit

## 2013-12-15 ENCOUNTER — Telehealth: Payer: Self-pay | Admitting: Oncology

## 2013-12-15 ENCOUNTER — Other Ambulatory Visit: Payer: BC Managed Care – PPO

## 2013-12-15 ENCOUNTER — Ambulatory Visit: Payer: BC Managed Care – PPO

## 2013-12-15 ENCOUNTER — Ambulatory Visit (HOSPITAL_BASED_OUTPATIENT_CLINIC_OR_DEPARTMENT_OTHER): Payer: BC Managed Care – PPO

## 2013-12-15 ENCOUNTER — Other Ambulatory Visit (HOSPITAL_BASED_OUTPATIENT_CLINIC_OR_DEPARTMENT_OTHER): Payer: BC Managed Care – PPO

## 2013-12-15 ENCOUNTER — Ambulatory Visit (HOSPITAL_BASED_OUTPATIENT_CLINIC_OR_DEPARTMENT_OTHER): Payer: BC Managed Care – PPO | Admitting: Oncology

## 2013-12-15 VITALS — BP 117/52 | HR 67 | Temp 98.2°F | Resp 18 | Ht 62.0 in | Wt 236.2 lb

## 2013-12-15 DIAGNOSIS — C773 Secondary and unspecified malignant neoplasm of axilla and upper limb lymph nodes: Secondary | ICD-10-CM

## 2013-12-15 DIAGNOSIS — C50211 Malignant neoplasm of upper-inner quadrant of right female breast: Secondary | ICD-10-CM

## 2013-12-15 DIAGNOSIS — Z170 Estrogen receptor positive status [ER+]: Secondary | ICD-10-CM

## 2013-12-15 DIAGNOSIS — M25559 Pain in unspecified hip: Secondary | ICD-10-CM

## 2013-12-15 DIAGNOSIS — C7951 Secondary malignant neoplasm of bone: Secondary | ICD-10-CM

## 2013-12-15 DIAGNOSIS — C50411 Malignant neoplasm of upper-outer quadrant of right female breast: Secondary | ICD-10-CM

## 2013-12-15 DIAGNOSIS — G629 Polyneuropathy, unspecified: Secondary | ICD-10-CM

## 2013-12-15 DIAGNOSIS — C770 Secondary and unspecified malignant neoplasm of lymph nodes of head, face and neck: Secondary | ICD-10-CM

## 2013-12-15 DIAGNOSIS — Z5111 Encounter for antineoplastic chemotherapy: Secondary | ICD-10-CM

## 2013-12-15 DIAGNOSIS — C50419 Malignant neoplasm of upper-outer quadrant of unspecified female breast: Secondary | ICD-10-CM

## 2013-12-15 LAB — CBC WITH DIFFERENTIAL/PLATELET
BASO%: 0.7 % (ref 0.0–2.0)
Basophils Absolute: 0 10*3/uL (ref 0.0–0.1)
EOS%: 1.4 % (ref 0.0–7.0)
Eosinophils Absolute: 0 10*3/uL (ref 0.0–0.5)
HCT: 32.8 % — ABNORMAL LOW (ref 34.8–46.6)
HGB: 11.4 g/dL — ABNORMAL LOW (ref 11.6–15.9)
LYMPH%: 44.9 % (ref 14.0–49.7)
MCH: 35.4 pg — ABNORMAL HIGH (ref 25.1–34.0)
MCHC: 34.8 g/dL (ref 31.5–36.0)
MCV: 101.9 fL — ABNORMAL HIGH (ref 79.5–101.0)
MONO#: 0.2 10*3/uL (ref 0.1–0.9)
MONO%: 7.8 % (ref 0.0–14.0)
NEUT#: 1.3 10*3/uL — ABNORMAL LOW (ref 1.5–6.5)
NEUT%: 45.2 % (ref 38.4–76.8)
Platelets: 199 10*3/uL (ref 145–400)
RBC: 3.22 10*6/uL — AB (ref 3.70–5.45)
RDW: 13.3 % (ref 11.2–14.5)
WBC: 2.9 10*3/uL — ABNORMAL LOW (ref 3.9–10.3)
lymph#: 1.3 10*3/uL (ref 0.9–3.3)

## 2013-12-15 MED ORDER — EPINEPHRINE HCL 0.1 MG/ML IJ SOLN: 0.2500 mg | Freq: Once | INTRAMUSCULAR | Status: DC | PRN

## 2013-12-15 MED ORDER — DENOSUMAB 120 MG/1.7ML ~~LOC~~ SOLN
120.0000 mg | Freq: Once | SUBCUTANEOUS | Status: AC
  Administered 2013-12-15: 120 mg via SUBCUTANEOUS
  Filled 2013-12-15: qty 1.7

## 2013-12-15 MED ORDER — GOSERELIN ACETATE 3.6 MG ~~LOC~~ IMPL
3.6000 mg | DRUG_IMPLANT | SUBCUTANEOUS | Status: DC
  Administered 2013-12-15: 3.6 mg via SUBCUTANEOUS
  Filled 2013-12-15: qty 3.6

## 2013-12-15 NOTE — Telephone Encounter (Signed)
, °

## 2013-12-15 NOTE — Addendum Note (Signed)
Addended by: [name] on: 12/15/2013 05:43 PM   Modules accepted: Orders, Medications
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
## 2013-12-15 NOTE — Patient Instructions (Signed)
Denosumab injection What is this medicine? DENOSUMAB (den oh sue mab) slows bone breakdown. Prolia is used to treat osteoporosis in women after menopause and in men. Xgeva is used to prevent bone fractures and other bone problems caused by cancer bone metastases. Xgeva is also used to treat giant cell tumor of the bone. This medicine may be used for other purposes; ask your health care provider or pharmacist if you have questions. COMMON BRAND NAME(S): Prolia, XGEVA What should I tell my health care provider before I take this medicine? They need to know if you have any of these conditions: -dental disease -eczema -infection or history of infections -kidney disease or on dialysis -low blood calcium or vitamin D -malabsorption syndrome -scheduled to have surgery or tooth extraction -taking medicine that contains denosumab -thyroid or parathyroid disease -an unusual reaction to denosumab, other medicines, foods, dyes, or preservatives -pregnant or trying to get pregnant -breast-feeding How should I use this medicine? This medicine is for injection under the skin. It is given by a health care professional in a hospital or clinic setting. If you are getting Prolia, a special MedGuide will be given to you by the pharmacist with each prescription and refill. Be sure to read this information carefully each time. For Prolia, talk to your pediatrician regarding the use of this medicine in children. Special care may be needed. For Xgeva, talk to your pediatrician regarding the use of this medicine in children. While this drug may be prescribed for children as young as 13 years for selected conditions, precautions do apply. Overdosage: If you think you've taken too much of this medicine contact a poison control center or emergency room at once. Overdosage: If you think you have taken too much of this medicine contact a poison control center or emergency room at once. NOTE: This medicine is only for  you. Do not share this medicine with others. What if I miss a dose? It is important not to miss your dose. Call your doctor or health care professional if you are unable to keep an appointment. What may interact with this medicine? Do not take this medicine with any of the following medications: -other medicines containing denosumab This medicine may also interact with the following medications: -medicines that suppress the immune system -medicines that treat cancer -steroid medicines like prednisone or cortisone This list may not describe all possible interactions. Give your health care provider a list of all the medicines, herbs, non-prescription drugs, or dietary supplements you use. Also tell them if you smoke, drink alcohol, or use illegal drugs. Some items may interact with your medicine. What should I watch for while using this medicine? Visit your doctor or health care professional for regular checks on your progress. Your doctor or health care professional may order blood tests and other tests to see how you are doing. Call your doctor or health care professional if you get a cold or other infection while receiving this medicine. Do not treat yourself. This medicine may decrease your body's ability to fight infection. You should make sure you get enough calcium and vitamin D while you are taking this medicine, unless your doctor tells you not to. Discuss the foods you eat and the vitamins you take with your health care professional. See your dentist regularly. Brush and floss your teeth as directed. Before you have any dental work done, tell your dentist you are receiving this medicine. Do not become pregnant while taking this medicine or for 5 months after stopping   it. Women should inform their doctor if they wish to become pregnant or think they might be pregnant. There is a potential for serious side effects to an unborn child. Talk to your health care professional or pharmacist for more  information. What side effects may I notice from receiving this medicine? Side effects that you should report to your doctor or health care professional as soon as possible: -allergic reactions like skin rash, itching or hives, swelling of the face, lips, or tongue -breathing problems -chest pain -fast, irregular heartbeat -feeling faint or lightheaded, falls -fever, chills, or any other sign of infection -muscle spasms, tightening, or twitches -numbness or tingling -skin blisters or bumps, or is dry, peels, or red -slow healing or unexplained pain in the mouth or jaw -unusual bleeding or bruising Side effects that usually do not require medical attention (Report these to your doctor or health care professional if they continue or are bothersome.): -muscle pain -stomach upset, gas This list may not describe all possible side effects. Call your doctor for medical advice about side effects. You may report side effects to FDA at 1-800-FDA-1088. Where should I keep my medicine? This medicine is only given in a clinic, doctor's office, or other health care setting and will not be stored at home. NOTE: This sheet is a summary. It may not cover all possible information. If you have questions about this medicine, talk to your doctor, pharmacist, or health care provider.  2015, Elsevier/Gold Standard. (2011-07-13 12:37:47) Goserelin injection What is this medicine? GOSERELIN (GOE se rel in) is similar to a hormone found in the body. It lowers the amount of sex hormones that the body makes. Men will have lower testosterone levels and women will have lower estrogen levels while taking this medicine. In men, this medicine is used to treat prostate cancer; the injection is either given once per month or once every 12 weeks. A once per month injection (only) is used to treat women with endometriosis, dysfunctional uterine bleeding, or advanced breast cancer. This medicine may be used for other purposes;  ask your health care provider or pharmacist if you have questions. COMMON BRAND NAME(S): Zoladex What should I tell my health care provider before I take this medicine? They need to know if you have any of these conditions (some only apply to women): -diabetes -heart disease or previous heart attack -high blood pressure -high cholesterol -kidney disease -osteoporosis or low bone density -problems passing urine -spinal cord injury -stroke -tobacco smoker -an unusual or allergic reaction to goserelin, hormone therapy, other medicines, foods, dyes, or preservatives -pregnant or trying to get pregnant -breast-feeding How should I use this medicine? This medicine is for injection under the skin. It is given by a health care professional in a hospital or clinic setting. Men receive this injection once every 4 weeks or once every 12 weeks. Women will only receive the once every 4 weeks injection. Talk to your pediatrician regarding the use of this medicine in children. Special care may be needed. Overdosage: If you think you have taken too much of this medicine contact a poison control center or emergency room at once. NOTE: This medicine is only for you. Do not share this medicine with others. What if I miss a dose? It is important not to miss your dose. Call your doctor or health care professional if you are unable to keep an appointment. What may interact with this medicine? -female hormones like estrogen -herbal or dietary supplements like black cohosh, chasteberry,   or DHEA -female hormones like testosterone -prasterone This list may not describe all possible interactions. Give your health care provider a list of all the medicines, herbs, non-prescription drugs, or dietary supplements you use. Also tell them if you smoke, drink alcohol, or use illegal drugs. Some items may interact with your medicine. What should I watch for while using this medicine? Visit your doctor or health care  professional for regular checks on your progress. Your symptoms may appear to get worse during the first weeks of this therapy. Tell your doctor or healthcare professional if your symptoms do not start to get better or if they get worse after this time. Your bones may get weaker if you take this medicine for a long time. If you smoke or frequently drink alcohol you may increase your risk of bone loss. A family history of osteoporosis, chronic use of drugs for seizures (convulsions), or corticosteroids can also increase your risk of bone loss. Talk to your doctor about how to keep your bones strong. This medicine should stop regular monthly menstration in women. Tell your doctor if you continue to menstrate. Women should not become pregnant while taking this medicine or for 12 weeks after stopping this medicine. Women should inform their doctor if they wish to become pregnant or think they might be pregnant. There is a potential for serious side effects to an unborn child. Talk to your health care professional or pharmacist for more information. Do not breast-feed an infant while taking this medicine. Men should inform their doctors if they wish to father a child. This medicine may lower sperm counts. Talk to your health care professional or pharmacist for more information. What side effects may I notice from receiving this medicine? Side effects that you should report to your doctor or health care professional as soon as possible: -allergic reactions like skin rash, itching or hives, swelling of the face, lips, or tongue -bone pain -breathing problems -changes in vision -chest pain -feeling faint or lightheaded, falls -fever, chills -pain, swelling, warmth in the leg -pain, tingling, numbness in the hands or feet -signs and symptoms of low blood pressure like dizziness; feeling faint or lightheaded, falls; unusually weak or tired -stomach pain -swelling of the ankles, feet, hands -trouble passing  urine or change in the amount of urine -unusually high or low blood pressure -unusually weak or tired Side effects that usually do not require medical attention (report to your doctor or health care professional if they continue or are bothersome): -change in sex drive or performance -changes in breast size in both males and females -changes in emotions or moods -headache -hot flashes -irritation at site where injected -loss of appetite -skin problems like acne, dry skin -vaginal dryness This list may not describe all possible side effects. Call your doctor for medical advice about side effects. You may report side effects to FDA at 1-800-FDA-1088. Where should I keep my medicine? This drug is given in a hospital or clinic and will not be stored at home. NOTE: This sheet is a summary. It may not cover all possible information. If you have questions about this medicine, talk to your doctor, pharmacist, or health care provider.  2015, Elsevier/Gold Standard. (2013-03-21 11:10:35)  

## 2013-12-28 ENCOUNTER — Encounter: Payer: Self-pay | Admitting: *Deleted

## 2013-12-28 NOTE — Progress Notes (Signed)
RECEIVED A FAX FROM [facility] CONCERNING A CONFIRMATION OF PRESCRIPTION SHIPMENT FOR IBRANCE ON 12/26/13.

## 2014-01-12 ENCOUNTER — Ambulatory Visit (HOSPITAL_BASED_OUTPATIENT_CLINIC_OR_DEPARTMENT_OTHER): Payer: BC Managed Care – PPO

## 2014-01-12 ENCOUNTER — Other Ambulatory Visit (HOSPITAL_BASED_OUTPATIENT_CLINIC_OR_DEPARTMENT_OTHER): Payer: BC Managed Care – PPO

## 2014-01-12 ENCOUNTER — Other Ambulatory Visit: Payer: BC Managed Care – PPO

## 2014-01-12 VITALS — BP 118/63 | HR 77 | Temp 97.7°F | Resp 22

## 2014-01-12 DIAGNOSIS — C50819 Malignant neoplasm of overlapping sites of unspecified female breast: Secondary | ICD-10-CM

## 2014-01-12 DIAGNOSIS — Z452 Encounter for adjustment and management of vascular access device: Secondary | ICD-10-CM

## 2014-01-12 DIAGNOSIS — C50419 Malignant neoplasm of upper-outer quadrant of unspecified female breast: Secondary | ICD-10-CM

## 2014-01-12 DIAGNOSIS — C7951 Secondary malignant neoplasm of bone: Secondary | ICD-10-CM

## 2014-01-12 DIAGNOSIS — C50411 Malignant neoplasm of upper-outer quadrant of right female breast: Secondary | ICD-10-CM

## 2014-01-12 DIAGNOSIS — Z5111 Encounter for antineoplastic chemotherapy: Secondary | ICD-10-CM

## 2014-01-12 DIAGNOSIS — Z95828 Presence of other vascular implants and grafts: Secondary | ICD-10-CM

## 2014-01-12 LAB — COMPREHENSIVE METABOLIC PANEL (CC13)
ALT: 33 U/L (ref 0–55)
ANION GAP: 11 meq/L (ref 3–11)
AST: 21 U/L (ref 5–34)
Albumin: 3.7 g/dL (ref 3.5–5.0)
Alkaline Phosphatase: 69 U/L (ref 40–150)
BILIRUBIN TOTAL: 0.27 mg/dL (ref 0.20–1.20)
BUN: 16 mg/dL (ref 7.0–26.0)
CO2: 22 meq/L (ref 22–29)
Calcium: 9.2 mg/dL (ref 8.4–10.4)
Chloride: 108 mEq/L (ref 98–109)
Creatinine: 1 mg/dL (ref 0.6–1.1)
EGFR: 62 mL/min/{1.73_m2} — ABNORMAL LOW (ref >90)
Glucose: 138 mg/dl (ref 70–140)
POTASSIUM: 4.1 meq/L (ref 3.5–5.1)
SODIUM: 141 meq/L (ref 136–145)
TOTAL PROTEIN: 6.7 g/dL (ref 6.4–8.3)

## 2014-01-12 LAB — CBC WITH DIFFERENTIAL/PLATELET
BASO%: 0.7 % (ref 0.0–2.0)
Basophils Absolute: 0 10*3/uL (ref 0.0–0.1)
EOS ABS: 0 10*3/uL (ref 0.0–0.5)
EOS%: 0.3 % (ref 0.0–7.0)
HCT: 34.4 % — ABNORMAL LOW (ref 34.8–46.6)
HGB: 12.1 g/dL (ref 11.6–15.9)
LYMPH#: 1.2 10*3/uL (ref 0.9–3.3)
LYMPH%: 39 % (ref 14.0–49.7)
MCH: 35.4 pg — ABNORMAL HIGH (ref 25.1–34.0)
MCHC: 35.2 g/dL (ref 31.5–36.0)
MCV: 100.6 fL (ref 79.5–101.0)
MONO#: 0.1 10*3/uL (ref 0.1–0.9)
MONO%: 4.1 % (ref 0.0–14.0)
NEUT%: 55.9 % (ref 38.4–76.8)
NEUTROS ABS: 1.7 10*3/uL (ref 1.5–6.5)
NRBC: 0 % (ref 0–0)
Platelets: 228 10*3/uL (ref 145–400)
RBC: 3.42 10*6/uL — AB (ref 3.70–5.45)
RDW: 13.7 % (ref 11.2–14.5)
WBC: 3 10*3/uL — AB (ref 3.9–10.3)

## 2014-01-12 MED ORDER — GOSERELIN ACETATE 3.6 MG ~~LOC~~ IMPL
3.6000 mg | DRUG_IMPLANT | SUBCUTANEOUS | Status: DC
  Administered 2014-01-12: 3.6 mg via SUBCUTANEOUS
  Filled 2014-01-12: qty 3.6

## 2014-01-12 MED ORDER — SODIUM CHLORIDE 0.9 % IJ SOLN
10.0000 mL | INTRAMUSCULAR | Status: DC | PRN
  Administered 2014-01-12: 10 mL via INTRAVENOUS
  Filled 2014-01-12: qty 10

## 2014-01-12 MED ORDER — DENOSUMAB 120 MG/1.7ML ~~LOC~~ SOLN
120.0000 mg | Freq: Once | SUBCUTANEOUS | Status: AC
  Administered 2014-01-12: 120 mg via SUBCUTANEOUS
  Filled 2014-01-12: qty 1.7

## 2014-01-12 MED ORDER — HEPARIN SOD (PORK) LOCK FLUSH 100 UNIT/ML IV SOLN
500.0000 [IU] | Freq: Once | INTRAVENOUS | Status: AC
  Administered 2014-01-12: 500 [IU] via INTRAVENOUS
  Filled 2014-01-12: qty 5

## 2014-01-12 NOTE — Patient Instructions (Signed)
Goserelin injection What is this medicine? GOSERELIN (GOE se rel in) is similar to a hormone found in the body. It lowers the amount of sex hormones that the body makes. Men will have lower testosterone levels and women will have lower estrogen levels while taking this medicine. In men, this medicine is used to treat prostate cancer; the injection is either given once per month or once every 12 weeks. A once per month injection (only) is used to treat women with endometriosis, dysfunctional uterine bleeding, or advanced breast cancer. This medicine may be used for other purposes; ask your health care provider or pharmacist if you have questions. COMMON BRAND NAME(S): Zoladex What should I tell my health care provider before I take this medicine? They need to know if you have any of these conditions (some only apply to women): -diabetes -heart disease or previous heart attack -high blood pressure -high cholesterol -kidney disease -osteoporosis or low bone density -problems passing urine -spinal cord injury -stroke -tobacco smoker -an unusual or allergic reaction to goserelin, hormone therapy, other medicines, foods, dyes, or preservatives -pregnant or trying to get pregnant -breast-feeding How should I use this medicine? This medicine is for injection under the skin. It is given by a health care professional in a hospital or clinic setting. Men receive this injection once every 4 weeks or once every 12 weeks. Women will only receive the once every 4 weeks injection. Talk to your pediatrician regarding the use of this medicine in children. Special care may be needed. Overdosage: If you think you have taken too much of this medicine contact a poison control center or emergency room at once. NOTE: This medicine is only for you. Do not share this medicine with others. What if I miss a dose? It is important not to miss your dose. Call your doctor or health care professional if you are unable to  keep an appointment. What may interact with this medicine? -female hormones like estrogen -herbal or dietary supplements like black cohosh, chasteberry, or DHEA -female hormones like testosterone -prasterone This list may not describe all possible interactions. Give your health care provider a list of all the medicines, herbs, non-prescription drugs, or dietary supplements you use. Also tell them if you smoke, drink alcohol, or use illegal drugs. Some items may interact with your medicine. What should I watch for while using this medicine? Visit your doctor or health care professional for regular checks on your progress. Your symptoms may appear to get worse during the first weeks of this therapy. Tell your doctor or healthcare professional if your symptoms do not start to get better or if they get worse after this time. Your bones may get weaker if you take this medicine for a long time. If you smoke or frequently drink alcohol you may increase your risk of bone loss. A family history of osteoporosis, chronic use of drugs for seizures (convulsions), or corticosteroids can also increase your risk of bone loss. Talk to your doctor about how to keep your bones strong. This medicine should stop regular monthly menstration in women. Tell your doctor if you continue to menstrate. Women should not become pregnant while taking this medicine or for 12 weeks after stopping this medicine. Women should inform their doctor if they wish to become pregnant or think they might be pregnant. There is a potential for serious side effects to an unborn child. Talk to your health care professional or pharmacist for more information. Do not breast-feed an infant while taking   this medicine. Men should inform their doctors if they wish to father a child. This medicine may lower sperm counts. Talk to your health care professional or pharmacist for more information. What side effects may I notice from receiving this  medicine? Side effects that you should report to your doctor or health care professional as soon as possible: -allergic reactions like skin rash, itching or hives, swelling of the face, lips, or tongue -bone pain -breathing problems -changes in vision -chest pain -feeling faint or lightheaded, falls -fever, chills -pain, swelling, warmth in the leg -pain, tingling, numbness in the hands or feet -signs and symptoms of low blood pressure like dizziness; feeling faint or lightheaded, falls; unusually weak or tired -stomach pain -swelling of the ankles, feet, hands -trouble passing urine or change in the amount of urine -unusually high or low blood pressure -unusually weak or tired Side effects that usually do not require medical attention (report to your doctor or health care professional if they continue or are bothersome): -change in sex drive or performance -changes in breast size in both males and females -changes in emotions or moods -headache -hot flashes -irritation at site where injected -loss of appetite -skin problems like acne, dry skin -vaginal dryness This list may not describe all possible side effects. Call your doctor for medical advice about side effects. You may report side effects to FDA at 1-800-FDA-1088. Where should I keep my medicine? This drug is given in a hospital or clinic and will not be stored at home. NOTE: This sheet is a summary. It may not cover all possible information. If you have questions about this medicine, talk to your doctor, pharmacist, or health care provider.  2015, [employer]. (2013-03-21 11:10:35) Denosumab injection What is this medicine? DENOSUMAB (den oh sue mab) slows bone breakdown. Prolia is used to treat osteoporosis in women after menopause and in men. Delton See is used to prevent bone fractures and other bone problems caused by cancer bone metastases. Delton See is also used to treat giant cell tumor of the bone. This medicine  may be used for other purposes; ask your health care provider or pharmacist if you have questions. COMMON BRAND NAME(S): Prolia, XGEVA What should I tell my health care provider before I take this medicine? They need to know if you have any of these conditions: -dental disease -eczema -infection or history of infections -kidney disease or on dialysis -low blood calcium or vitamin D -malabsorption syndrome -scheduled to have surgery or tooth extraction -taking medicine that contains denosumab -thyroid or parathyroid disease -an unusual reaction to denosumab, other medicines, foods, dyes, or preservatives -pregnant or trying to get pregnant -breast-feeding How should I use this medicine? This medicine is for injection under the skin. It is given by a health care professional in a hospital or clinic setting. If you are getting Prolia, a special MedGuide will be given to you by the pharmacist with each prescription and refill. Be sure to read this information carefully each time. For Prolia, talk to your pediatrician regarding the use of this medicine in children. Special care may be needed. For Delton See, talk to your pediatrician regarding the use of this medicine in children. While this drug may be prescribed for children as young as 13 years for selected conditions, precautions do apply. Overdosage: If you think you've taken too much of this medicine contact a poison control center or emergency room at once. Overdosage: If you think you have taken too much of this medicine contact a  poison control center or emergency room at once. NOTE: This medicine is only for you. Do not share this medicine with others. What if I miss a dose? It is important not to miss your dose. Call your doctor or health care professional if you are unable to keep an appointment. What may interact with this medicine? Do not take this medicine with any of the following medications: -other medicines containing  denosumab This medicine may also interact with the following medications: -medicines that suppress the immune system -medicines that treat cancer -steroid medicines like prednisone or cortisone This list may not describe all possible interactions. Give your health care provider a list of all the medicines, herbs, non-prescription drugs, or dietary supplements you use. Also tell them if you smoke, drink alcohol, or use illegal drugs. Some items may interact with your medicine. What should I watch for while using this medicine? Visit your doctor or health care professional for regular checks on your progress. Your doctor or health care professional may order blood tests and other tests to see how you are doing. Call your doctor or health care professional if you get a cold or other infection while receiving this medicine. Do not treat yourself. This medicine may decrease your body's ability to fight infection. You should make sure you get enough calcium and vitamin D while you are taking this medicine, unless your doctor tells you not to. Discuss the foods you eat and the vitamins you take with your health care professional. See your dentist regularly. Brush and floss your teeth as directed. Before you have any dental work done, tell your dentist you are receiving this medicine. Do not become pregnant while taking this medicine or for 5 months after stopping it. Women should inform their doctor if they wish to become pregnant or think they might be pregnant. There is a potential for serious side effects to an unborn child. Talk to your health care professional or pharmacist for more information. What side effects may I notice from receiving this medicine? Side effects that you should report to your doctor or health care professional as soon as possible: -allergic reactions like skin rash, itching or hives, swelling of the face, lips, or tongue -breathing problems -chest pain -fast, irregular  heartbeat -feeling faint or lightheaded, falls -fever, chills, or any other sign of infection -muscle spasms, tightening, or twitches -numbness or tingling -skin blisters or bumps, or is dry, peels, or red -slow healing or unexplained pain in the mouth or jaw -unusual bleeding or bruising Side effects that usually do not require medical attention (Report these to your doctor or health care professional if they continue or are bothersome.): -muscle pain -stomach upset, gas This list may not describe all possible side effects. Call your doctor for medical advice about side effects. You may report side effects to FDA at 1-800-FDA-1088. Where should I keep my medicine? This medicine is only given in a clinic, doctor's office, or other health care setting and will not be stored at home. NOTE: This sheet is a summary. It may not cover all possible information. If you have questions about this medicine, talk to your doctor, pharmacist, or health care provider.  2015, [employer]. (2011-07-13 12:37:47) Denosumab injection What is this medicine? DENOSUMAB (den oh sue mab) slows bone breakdown. Prolia is used to treat osteoporosis in women after menopause and in men. Delton See is used to prevent bone fractures and other bone problems caused by cancer bone metastases. Delton See is also used to  treat giant cell tumor of the bone. This medicine may be used for other purposes; ask your health care provider or pharmacist if you have questions. COMMON BRAND NAME(S): Prolia, XGEVA What should I tell my health care provider before I take this medicine? They need to know if you have any of these conditions: -dental disease -eczema -infection or history of infections -kidney disease or on dialysis -low blood calcium or vitamin D -malabsorption syndrome -scheduled to have surgery or tooth extraction -taking medicine that contains denosumab -thyroid or parathyroid disease -an unusual reaction to  denosumab, other medicines, foods, dyes, or preservatives -pregnant or trying to get pregnant -breast-feeding How should I use this medicine? This medicine is for injection under the skin. It is given by a health care professional in a hospital or clinic setting. If you are getting Prolia, a special MedGuide will be given to you by the pharmacist with each prescription and refill. Be sure to read this information carefully each time. For Prolia, talk to your pediatrician regarding the use of this medicine in children. Special care may be needed. For Delton See, talk to your pediatrician regarding the use of this medicine in children. While this drug may be prescribed for children as young as 13 years for selected conditions, precautions do apply. Overdosage: If you think you've taken too much of this medicine contact a poison control center or emergency room at once. Overdosage: If you think you have taken too much of this medicine contact a poison control center or emergency room at once. NOTE: This medicine is only for you. Do not share this medicine with others. What if I miss a dose? It is important not to miss your dose. Call your doctor or health care professional if you are unable to keep an appointment. What may interact with this medicine? Do not take this medicine with any of the following medications: -other medicines containing denosumab This medicine may also interact with the following medications: -medicines that suppress the immune system -medicines that treat cancer -steroid medicines like prednisone or cortisone This list may not describe all possible interactions. Give your health care provider a list of all the medicines, herbs, non-prescription drugs, or dietary supplements you use. Also tell them if you smoke, drink alcohol, or use illegal drugs. Some items may interact with your medicine. What should I watch for while using this medicine? Visit your doctor or health care  professional for regular checks on your progress. Your doctor or health care professional may order blood tests and other tests to see how you are doing. Call your doctor or health care professional if you get a cold or other infection while receiving this medicine. Do not treat yourself. This medicine may decrease your body's ability to fight infection. You should make sure you get enough calcium and vitamin D while you are taking this medicine, unless your doctor tells you not to. Discuss the foods you eat and the vitamins you take with your health care professional. See your dentist regularly. Brush and floss your teeth as directed. Before you have any dental work done, tell your dentist you are receiving this medicine. Do not become pregnant while taking this medicine or for 5 months after stopping it. Women should inform their doctor if they wish to become pregnant or think they might be pregnant. There is a potential for serious side effects to an unborn child. Talk to your health care professional or pharmacist for more information. What side effects may I notice  from receiving this medicine? Side effects that you should report to your doctor or health care professional as soon as possible: -allergic reactions like skin rash, itching or hives, swelling of the face, lips, or tongue -breathing problems -chest pain -fast, irregular heartbeat -feeling faint or lightheaded, falls -fever, chills, or any other sign of infection -muscle spasms, tightening, or twitches -numbness or tingling -skin blisters or bumps, or is dry, peels, or red -slow healing or unexplained pain in the mouth or jaw -unusual bleeding or bruising Side effects that usually do not require medical attention (Report these to your doctor or health care professional if they continue or are bothersome.): -muscle pain -stomach upset, gas This list may not describe all possible side effects. Call your doctor for medical advice  about side effects. You may report side effects to FDA at 1-800-FDA-1088. Where should I keep my medicine? This medicine is only given in a clinic, doctor's office, or other health care setting and will not be stored at home. NOTE: This sheet is a summary. It may not cover all possible information. If you have questions about this medicine, talk to your doctor, pharmacist, or health care provider.  2015, [employer]. (2011-07-13 12:37:47) Goserelin injection What is this medicine? GOSERELIN (GOE se rel in) is similar to a hormone found in the body. It lowers the amount of sex hormones that the body makes. Men will have lower testosterone levels and women will have lower estrogen levels while taking this medicine. In men, this medicine is used to treat prostate cancer; the injection is either given once per month or once every 12 weeks. A once per month injection (only) is used to treat women with endometriosis, dysfunctional uterine bleeding, or advanced breast cancer. This medicine may be used for other purposes; ask your health care provider or pharmacist if you have questions. COMMON BRAND NAME(S): Zoladex What should I tell my health care provider before I take this medicine? They need to know if you have any of these conditions (some only apply to women): -diabetes -heart disease or previous heart attack -high blood pressure -high cholesterol -kidney disease -osteoporosis or low bone density -problems passing urine -spinal cord injury -stroke -tobacco smoker -an unusual or allergic reaction to goserelin, hormone therapy, other medicines, foods, dyes, or preservatives -pregnant or trying to get pregnant -breast-feeding How should I use this medicine? This medicine is for injection under the skin. It is given by a health care professional in a hospital or clinic setting. Men receive this injection once every 4 weeks or once every 12 weeks. Women will only receive the once  every 4 weeks injection. Talk to your pediatrician regarding the use of this medicine in children. Special care may be needed. Overdosage: If you think you have taken too much of this medicine contact a poison control center or emergency room at once. NOTE: This medicine is only for you. Do not share this medicine with others. What if I miss a dose? It is important not to miss your dose. Call your doctor or health care professional if you are unable to keep an appointment. What may interact with this medicine? -female hormones like estrogen -herbal or dietary supplements like black cohosh, chasteberry, or DHEA -female hormones like testosterone -prasterone This list may not describe all possible interactions. Give your health care provider a list of all the medicines, herbs, non-prescription drugs, or dietary supplements you use. Also tell them if you smoke, drink alcohol, or use illegal drugs. Some  items may interact with your medicine. What should I watch for while using this medicine? Visit your doctor or health care professional for regular checks on your progress. Your symptoms may appear to get worse during the first weeks of this therapy. Tell your doctor or healthcare professional if your symptoms do not start to get better or if they get worse after this time. Your bones may get weaker if you take this medicine for a long time. If you smoke or frequently drink alcohol you may increase your risk of bone loss. A family history of osteoporosis, chronic use of drugs for seizures (convulsions), or corticosteroids can also increase your risk of bone loss. Talk to your doctor about how to keep your bones strong. This medicine should stop regular monthly menstration in women. Tell your doctor if you continue to [facility]. Women should not become pregnant while taking this medicine or for 12 weeks after stopping this medicine. Women should inform their doctor if they wish to become pregnant or think  they might be pregnant. There is a potential for serious side effects to an unborn child. Talk to your health care professional or pharmacist for more information. Do not breast-feed an infant while taking this medicine. Men should inform their doctors if they wish to father a child. This medicine may lower sperm counts. Talk to your health care professional or pharmacist for more information. What side effects may I notice from receiving this medicine? Side effects that you should report to your doctor or health care professional as soon as possible: -allergic reactions like skin rash, itching or hives, swelling of the face, lips, or tongue -bone pain -breathing problems -changes in vision -chest pain -feeling faint or lightheaded, falls -fever, chills -pain, swelling, warmth in the leg -pain, tingling, numbness in the hands or feet -signs and symptoms of low blood pressure like dizziness; feeling faint or lightheaded, falls; unusually weak or tired -stomach pain -swelling of the ankles, feet, hands -trouble passing urine or change in the amount of urine -unusually high or low blood pressure -unusually weak or tired Side effects that usually do not require medical attention (report to your doctor or health care professional if they continue or are bothersome): -change in sex drive or performance -changes in breast size in both males and females -changes in emotions or moods -headache -hot flashes -irritation at site where injected -loss of appetite -skin problems like acne, dry skin -vaginal dryness This list may not describe all possible side effects. Call your doctor for medical advice about side effects. You may report side effects to FDA at 1-800-FDA-1088. Where should I keep my medicine? This drug is given in a hospital or clinic and will not be stored at home. NOTE: This sheet is a summary. It may not cover all possible information. If you have questions about this medicine,  talk to your doctor, pharmacist, or health care provider.  2015, [employer]. (2013-03-21 11:10:35)

## 2014-01-12 NOTE — Patient Instructions (Signed)

## 2014-02-09 ENCOUNTER — Other Ambulatory Visit (HOSPITAL_BASED_OUTPATIENT_CLINIC_OR_DEPARTMENT_OTHER): Payer: BLUE CROSS/BLUE SHIELD

## 2014-02-09 ENCOUNTER — Ambulatory Visit (HOSPITAL_BASED_OUTPATIENT_CLINIC_OR_DEPARTMENT_OTHER): Payer: BLUE CROSS/BLUE SHIELD

## 2014-02-09 VITALS — BP 129/68 | HR 64 | Temp 97.8°F

## 2014-02-09 DIAGNOSIS — C50411 Malignant neoplasm of upper-outer quadrant of right female breast: Secondary | ICD-10-CM

## 2014-02-09 DIAGNOSIS — C773 Secondary and unspecified malignant neoplasm of axilla and upper limb lymph nodes: Secondary | ICD-10-CM

## 2014-02-09 DIAGNOSIS — C50419 Malignant neoplasm of upper-outer quadrant of unspecified female breast: Secondary | ICD-10-CM

## 2014-02-09 DIAGNOSIS — C50211 Malignant neoplasm of upper-inner quadrant of right female breast: Secondary | ICD-10-CM

## 2014-02-09 DIAGNOSIS — Z23 Encounter for immunization: Secondary | ICD-10-CM

## 2014-02-09 DIAGNOSIS — C7951 Secondary malignant neoplasm of bone: Secondary | ICD-10-CM

## 2014-02-09 DIAGNOSIS — Z5111 Encounter for antineoplastic chemotherapy: Secondary | ICD-10-CM

## 2014-02-09 LAB — COMPREHENSIVE METABOLIC PANEL (CC13)
ALBUMIN: 3.9 g/dL (ref 3.5–5.0)
ALK PHOS: 79 U/L (ref 40–150)
ALT: 46 U/L (ref 0–55)
AST: 27 U/L (ref 5–34)
Anion Gap: 12 mEq/L — ABNORMAL HIGH (ref 3–11)
BILIRUBIN TOTAL: 0.32 mg/dL (ref 0.20–1.20)
BUN: 17.3 mg/dL (ref 7.0–26.0)
CALCIUM: 9.5 mg/dL (ref 8.4–10.4)
CO2: 20 meq/L — AB (ref 22–29)
Chloride: 106 mEq/L (ref 98–109)
Creatinine: 1.1 mg/dL (ref 0.6–1.1)
EGFR: 60 mL/min/{1.73_m2} — AB (ref >90)
Glucose: 90 mg/dl (ref 70–140)
Potassium: 4.4 mEq/L (ref 3.5–5.1)
SODIUM: 139 meq/L (ref 136–145)
Total Protein: 7.3 g/dL (ref 6.4–8.3)

## 2014-02-09 LAB — CBC WITH DIFFERENTIAL/PLATELET
BASO%: 0.6 % (ref 0.0–2.0)
Basophils Absolute: 0 10*3/uL (ref 0.0–0.1)
EOS%: 0.6 % (ref 0.0–7.0)
Eosinophils Absolute: 0 10*3/uL (ref 0.0–0.5)
HCT: 36.6 % (ref 34.8–46.6)
HGB: 12.6 g/dL (ref 11.6–15.9)
LYMPH#: 1.4 10*3/uL (ref 0.9–3.3)
LYMPH%: 42.9 % (ref 14.0–49.7)
MCH: 34.6 pg — AB (ref 25.1–34.0)
MCHC: 34.4 g/dL (ref 31.5–36.0)
MCV: 100.5 fL (ref 79.5–101.0)
MONO#: 0.2 10*3/uL (ref 0.1–0.9)
MONO%: 6.7 % (ref 0.0–14.0)
NEUT%: 49.2 % (ref 38.4–76.8)
NEUTROS ABS: 1.6 10*3/uL (ref 1.5–6.5)
Platelets: 320 10*3/uL (ref 145–400)
RBC: 3.64 10*6/uL — ABNORMAL LOW (ref 3.70–5.45)
RDW: 13.7 % (ref 11.2–14.5)
WBC: 3.3 10*3/uL — ABNORMAL LOW (ref 3.9–10.3)

## 2014-02-09 MED ORDER — INFLUENZA VAC SPLIT QUAD 0.5 ML IM SUSY
0.5000 mL | PREFILLED_SYRINGE | Freq: Once | INTRAMUSCULAR | Status: AC
  Administered 2014-02-09: 0.5 mL via INTRAMUSCULAR
  Filled 2014-02-09: qty 0.5

## 2014-02-09 MED ORDER — DENOSUMAB 120 MG/1.7ML ~~LOC~~ SOLN
120.0000 mg | Freq: Once | SUBCUTANEOUS | Status: AC
  Administered 2014-02-09: 120 mg via SUBCUTANEOUS
  Filled 2014-02-09: qty 1.7

## 2014-02-09 MED ORDER — GOSERELIN ACETATE 3.6 MG ~~LOC~~ IMPL
3.6000 mg | DRUG_IMPLANT | SUBCUTANEOUS | Status: DC
  Administered 2014-02-09: 3.6 mg via SUBCUTANEOUS
  Filled 2014-02-09: qty 3.6

## 2014-02-09 NOTE — Patient Instructions (Signed)

## 2014-02-26 ENCOUNTER — Other Ambulatory Visit: Payer: Self-pay | Admitting: Oncology

## 2014-02-27 ENCOUNTER — Other Ambulatory Visit (INDEPENDENT_AMBULATORY_CARE_PROVIDER_SITE_OTHER): Payer: Self-pay | Admitting: General Surgery

## 2014-03-09 ENCOUNTER — Telehealth: Payer: Self-pay | Admitting: Oncology

## 2014-03-09 ENCOUNTER — Encounter: Payer: Self-pay | Admitting: Nurse Practitioner

## 2014-03-09 ENCOUNTER — Ambulatory Visit (HOSPITAL_BASED_OUTPATIENT_CLINIC_OR_DEPARTMENT_OTHER): Payer: BLUE CROSS/BLUE SHIELD

## 2014-03-09 ENCOUNTER — Other Ambulatory Visit (HOSPITAL_BASED_OUTPATIENT_CLINIC_OR_DEPARTMENT_OTHER): Payer: BLUE CROSS/BLUE SHIELD

## 2014-03-09 ENCOUNTER — Ambulatory Visit (HOSPITAL_BASED_OUTPATIENT_CLINIC_OR_DEPARTMENT_OTHER): Payer: BLUE CROSS/BLUE SHIELD | Admitting: Nurse Practitioner

## 2014-03-09 VITALS — BP 127/63 | HR 76 | Temp 97.9°F | Resp 20 | Wt 235.9 lb

## 2014-03-09 DIAGNOSIS — Z5111 Encounter for antineoplastic chemotherapy: Secondary | ICD-10-CM

## 2014-03-09 DIAGNOSIS — C50419 Malignant neoplasm of upper-outer quadrant of unspecified female breast: Secondary | ICD-10-CM

## 2014-03-09 DIAGNOSIS — C50211 Malignant neoplasm of upper-inner quadrant of right female breast: Secondary | ICD-10-CM

## 2014-03-09 DIAGNOSIS — C7951 Secondary malignant neoplasm of bone: Secondary | ICD-10-CM

## 2014-03-09 DIAGNOSIS — Z170 Estrogen receptor positive status [ER+]: Secondary | ICD-10-CM

## 2014-03-09 DIAGNOSIS — C50411 Malignant neoplasm of upper-outer quadrant of right female breast: Secondary | ICD-10-CM

## 2014-03-09 DIAGNOSIS — R110 Nausea: Secondary | ICD-10-CM

## 2014-03-09 DIAGNOSIS — Z95828 Presence of other vascular implants and grafts: Secondary | ICD-10-CM

## 2014-03-09 DIAGNOSIS — C773 Secondary and unspecified malignant neoplasm of axilla and upper limb lymph nodes: Secondary | ICD-10-CM

## 2014-03-09 LAB — CBC WITH DIFFERENTIAL/PLATELET
BASO%: 0.4 % (ref 0.0–2.0)
Basophils Absolute: 0 10*3/uL (ref 0.0–0.1)
EOS%: 1.5 % (ref 0.0–7.0)
Eosinophils Absolute: 0 10*3/uL (ref 0.0–0.5)
HCT: 39.9 % (ref 34.8–46.6)
HGB: 13.8 g/dL (ref 11.6–15.9)
LYMPH%: 36.3 % (ref 14.0–49.7)
MCH: 35 pg — AB (ref 25.1–34.0)
MCHC: 34.6 g/dL (ref 31.5–36.0)
MCV: 101.3 fL — ABNORMAL HIGH (ref 79.5–101.0)
MONO#: 0.1 10*3/uL (ref 0.1–0.9)
MONO%: 4.8 % (ref 0.0–14.0)
NEUT#: 1.5 10*3/uL (ref 1.5–6.5)
NEUT%: 57 % (ref 38.4–76.8)
NRBC: 0 % (ref 0–0)
Platelets: 189 10*3/uL (ref 145–400)
RBC: 3.94 10*6/uL (ref 3.70–5.45)
RDW: 13.8 % (ref 11.2–14.5)
WBC: 2.7 10*3/uL — ABNORMAL LOW (ref 3.9–10.3)
lymph#: 1 10*3/uL (ref 0.9–3.3)

## 2014-03-09 LAB — COMPREHENSIVE METABOLIC PANEL (CC13)
ALBUMIN: 3.2 g/dL — AB (ref 3.5–5.0)
ALT: 25 U/L (ref 0–55)
ANION GAP: 9 meq/L (ref 3–11)
AST: 18 U/L (ref 5–34)
Alkaline Phosphatase: 56 U/L (ref 40–150)
BUN: 14.1 mg/dL (ref 7.0–26.0)
CALCIUM: 7.5 mg/dL — AB (ref 8.4–10.4)
CHLORIDE: 112 meq/L — AB (ref 98–109)
CO2: 20 meq/L — AB (ref 22–29)
CREATININE: 0.9 mg/dL (ref 0.6–1.1)
EGFR: 69 mL/min/{1.73_m2} — ABNORMAL LOW (ref >90)
Glucose: 135 mg/dl (ref 70–140)
POTASSIUM: 3.7 meq/L (ref 3.5–5.1)
Sodium: 141 mEq/L (ref 136–145)
Total Bilirubin: 0.2 mg/dL (ref 0.20–1.20)
Total Protein: 5.8 g/dL — ABNORMAL LOW (ref 6.4–8.3)

## 2014-03-09 MED ORDER — PROCHLORPERAZINE MALEATE 5 MG PO TABS: 5.0000 mg | ORAL_TABLET | Freq: Three times a day (TID) | ORAL | Status: DC | PRN

## 2014-03-09 MED ORDER — DENOSUMAB 120 MG/1.7ML ~~LOC~~ SOLN: 120.0000 mg | Freq: Once | SUBCUTANEOUS | Status: DC

## 2014-03-09 MED ORDER — PROCHLORPERAZINE MALEATE 10 MG PO TABS: 10.0000 mg | ORAL_TABLET | Freq: Four times a day (QID) | ORAL | Status: DC | PRN

## 2014-03-09 MED ORDER — GOSERELIN ACETATE 3.6 MG ~~LOC~~ IMPL
3.6000 mg | DRUG_IMPLANT | SUBCUTANEOUS | Status: DC
Start: 2014-03-09 — End: 2014-03-09
  Administered 2014-03-09: 3.6 mg via SUBCUTANEOUS
  Filled 2014-03-09: qty 3.6

## 2014-03-09 MED ORDER — HEPARIN SOD (PORK) LOCK FLUSH 100 UNIT/ML IV SOLN
500.0000 [IU] | Freq: Once | INTRAVENOUS | Status: AC
  Administered 2014-03-09: 500 [IU] via INTRAVENOUS
  Filled 2014-03-09: qty 5

## 2014-03-09 MED ORDER — SODIUM CHLORIDE 0.9 % IJ SOLN
10.0000 mL | INTRAMUSCULAR | Status: DC | PRN
  Administered 2014-03-09: 10 mL via INTRAVENOUS
  Filled 2014-03-09: qty 10
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
## 2014-03-09 NOTE — Telephone Encounter (Signed)
gave pt avs report and appts for march thru may °

## 2014-03-29 NOTE — Telephone Encounter (Signed)
[facility] sent facsimile confirmation of [facility]brance prescription shipment.  Cheryl Moore was shipped on 03-28-2014 with next business day delivery.

## 2014-03-31 ENCOUNTER — Other Ambulatory Visit: Payer: Self-pay | Admitting: Oncology

## 2014-04-02 ENCOUNTER — Other Ambulatory Visit: Payer: Self-pay | Admitting: Oncology

## 2014-04-06 ENCOUNTER — Other Ambulatory Visit (HOSPITAL_BASED_OUTPATIENT_CLINIC_OR_DEPARTMENT_OTHER): Payer: BLUE CROSS/BLUE SHIELD

## 2014-04-06 ENCOUNTER — Ambulatory Visit (HOSPITAL_BASED_OUTPATIENT_CLINIC_OR_DEPARTMENT_OTHER): Payer: BLUE CROSS/BLUE SHIELD

## 2014-04-06 VITALS — BP 133/62 | HR 61 | Temp 98.1°F

## 2014-04-06 DIAGNOSIS — C7951 Secondary malignant neoplasm of bone: Secondary | ICD-10-CM

## 2014-04-06 DIAGNOSIS — C50411 Malignant neoplasm of upper-outer quadrant of right female breast: Secondary | ICD-10-CM

## 2014-04-06 DIAGNOSIS — Z5111 Encounter for antineoplastic chemotherapy: Secondary | ICD-10-CM

## 2014-04-06 DIAGNOSIS — C773 Secondary and unspecified malignant neoplasm of axilla and upper limb lymph nodes: Secondary | ICD-10-CM

## 2014-04-06 DIAGNOSIS — C50419 Malignant neoplasm of upper-outer quadrant of unspecified female breast: Secondary | ICD-10-CM

## 2014-04-06 LAB — CBC WITH DIFFERENTIAL/PLATELET
BASO%: 1.3 % (ref 0.0–2.0)
BASOS ABS: 0 10*3/uL (ref 0.0–0.1)
EOS%: 0.7 % (ref 0.0–7.0)
Eosinophils Absolute: 0 10*3/uL (ref 0.0–0.5)
HCT: 37.7 % (ref 34.8–46.6)
HGB: 13.2 g/dL (ref 11.6–15.9)
LYMPH%: 36.9 % (ref 14.0–49.7)
MCH: 35.4 pg — AB (ref 25.1–34.0)
MCHC: 35 g/dL (ref 31.5–36.0)
MCV: 101.1 fL — AB (ref 79.5–101.0)
MONO#: 0.2 10*3/uL (ref 0.1–0.9)
MONO%: 7 % (ref 0.0–14.0)
NEUT%: 54.1 % (ref 38.4–76.8)
NEUTROS ABS: 1.6 10*3/uL (ref 1.5–6.5)
PLATELETS: 284 10*3/uL (ref 145–400)
RBC: 3.73 10*6/uL (ref 3.70–5.45)
RDW: 14.2 % (ref 11.2–14.5)
WBC: 3 10*3/uL — ABNORMAL LOW (ref 3.9–10.3)
lymph#: 1.1 10*3/uL (ref 0.9–3.3)

## 2014-04-06 LAB — COMPREHENSIVE METABOLIC PANEL (CC13)
ALBUMIN: 3.8 g/dL (ref 3.5–5.0)
ALT: 46 U/L (ref 0–55)
ANION GAP: 9 meq/L (ref 3–11)
AST: 26 U/L (ref 5–34)
Alkaline Phosphatase: 67 U/L (ref 40–150)
BUN: 14.1 mg/dL (ref 7.0–26.0)
CALCIUM: 9.4 mg/dL (ref 8.4–10.4)
CHLORIDE: 108 meq/L (ref 98–109)
CO2: 21 meq/L — AB (ref 22–29)
Creatinine: 1.1 mg/dL (ref 0.6–1.1)
EGFR: 60 mL/min/{1.73_m2} — ABNORMAL LOW (ref >90)
Glucose: 103 mg/dl (ref 70–140)
Potassium: 4.5 mEq/L (ref 3.5–5.1)
Sodium: 138 mEq/L (ref 136–145)
Total Bilirubin: 0.32 mg/dL (ref 0.20–1.20)
Total Protein: 7 g/dL (ref 6.4–8.3)

## 2014-04-06 MED ORDER — DENOSUMAB 120 MG/1.7ML ~~LOC~~ SOLN
120.0000 mg | Freq: Once | SUBCUTANEOUS | Status: AC
  Administered 2014-04-06: 120 mg via SUBCUTANEOUS
  Filled 2014-04-06: qty 1.7

## 2014-04-06 MED ORDER — GOSERELIN ACETATE 3.6 MG ~~LOC~~ IMPL
3.6000 mg | DRUG_IMPLANT | SUBCUTANEOUS | Status: DC
  Administered 2014-04-06: 3.6 mg via SUBCUTANEOUS
  Filled 2014-04-06: qty 3.6

## 2014-04-09 ENCOUNTER — Encounter (HOSPITAL_COMMUNITY): Payer: Self-pay | Admitting: Pharmacy Technician

## 2014-04-09 ENCOUNTER — Encounter (HOSPITAL_COMMUNITY): Payer: Self-pay | Admitting: *Deleted

## 2014-04-10 ENCOUNTER — Encounter (HOSPITAL_COMMUNITY): Payer: Self-pay | Admitting: *Deleted

## 2014-04-10 ENCOUNTER — Ambulatory Visit (HOSPITAL_COMMUNITY): Payer: BLUE CROSS/BLUE SHIELD | Admitting: Certified Registered Nurse Anesthetist

## 2014-04-10 ENCOUNTER — Other Ambulatory Visit (INDEPENDENT_AMBULATORY_CARE_PROVIDER_SITE_OTHER): Payer: Self-pay | Admitting: General Surgery

## 2014-04-10 ENCOUNTER — Encounter (HOSPITAL_COMMUNITY)
Admission: RE | Disposition: A | Discharge status: Home / Self Care | Payer: Self-pay | Source: Ambulatory Visit | Attending: General Surgery

## 2014-04-10 ENCOUNTER — Ambulatory Visit (HOSPITAL_COMMUNITY)
Admission: RE | Admit: 2014-04-10 | Discharge: 2014-04-10 | Disposition: A | Discharge status: Home / Self Care | Payer: BLUE CROSS/BLUE SHIELD | Source: Ambulatory Visit | Attending: General Surgery | Admitting: General Surgery

## 2014-04-10 DIAGNOSIS — Z6841 Body Mass Index (BMI) 40.0 and over, adult: Secondary | ICD-10-CM | POA: Diagnosis not present

## 2014-04-10 DIAGNOSIS — Z87891 Personal history of nicotine dependence: Secondary | ICD-10-CM | POA: Diagnosis not present

## 2014-04-10 DIAGNOSIS — C50911 Malignant neoplasm of unspecified site of right female breast: Secondary | ICD-10-CM | POA: Insufficient documentation

## 2014-04-10 DIAGNOSIS — Z9011 Acquired absence of right breast and nipple: Secondary | ICD-10-CM | POA: Insufficient documentation

## 2014-04-10 DIAGNOSIS — Z86010 Personal history of colonic polyps: Secondary | ICD-10-CM | POA: Diagnosis not present

## 2014-04-10 DIAGNOSIS — Z880 Allergy status to penicillin: Secondary | ICD-10-CM | POA: Insufficient documentation

## 2014-04-10 DIAGNOSIS — Y838 Other surgical procedures as the cause of abnormal reaction of the patient, or of later complication, without mention of misadventure at the time of the procedure: Secondary | ICD-10-CM | POA: Insufficient documentation

## 2014-04-10 DIAGNOSIS — T80212A Local infection due to central venous catheter, initial encounter: Secondary | ICD-10-CM | POA: Diagnosis not present

## 2014-04-10 DIAGNOSIS — F419 Anxiety disorder, unspecified: Secondary | ICD-10-CM | POA: Diagnosis not present

## 2014-04-10 DIAGNOSIS — E669 Obesity, unspecified: Secondary | ICD-10-CM | POA: Diagnosis not present

## 2014-04-10 HISTORY — PX: PORT-A-CATH REMOVAL: SHX5289

## 2014-04-10 SURGERY — REMOVAL PORT-A-CATH
Anesthesia: General | Site: Chest | Laterality: Left

## 2014-04-10 MED ORDER — ARTIFICIAL TEARS OP OINT
TOPICAL_OINTMENT | OPHTHALMIC | Status: AC
  Filled 2014-04-10: qty 3.5

## 2014-04-10 MED ORDER — BUPIVACAINE-EPINEPHRINE (PF) 0.25% -1:200000 IJ SOLN
INTRAMUSCULAR | Status: AC
  Filled 2014-04-10: qty 30

## 2014-04-10 MED ORDER — BUPIVACAINE-EPINEPHRINE 0.25% -1:200000 IJ SOLN
INTRAMUSCULAR | Status: DC | PRN
  Administered 2014-04-10: 7 mL

## 2014-04-10 MED ORDER — FENTANYL CITRATE 0.05 MG/ML IJ SOLN
INTRAMUSCULAR | Status: DC | PRN
  Administered 2014-04-10 (×2): 25 ug via INTRAVENOUS

## 2014-04-10 MED ORDER — 0.9 % SODIUM CHLORIDE (POUR BTL) OPTIME
TOPICAL | Status: DC | PRN
  Administered 2014-04-10: 1000 mL

## 2014-04-10 MED ORDER — LIDOCAINE HCL (CARDIAC) 20 MG/ML IV SOLN
INTRAVENOUS | Status: AC
  Filled 2014-04-10: qty 5

## 2014-04-10 MED ORDER — MIDAZOLAM HCL 2 MG/2ML IJ SOLN
INTRAMUSCULAR | Status: AC
  Filled 2014-04-10: qty 2

## 2014-04-10 MED ORDER — ONDANSETRON HCL 4 MG/2ML IJ SOLN
INTRAMUSCULAR | Status: DC | PRN
  Administered 2014-04-10: 4 mg via INTRAVENOUS

## 2014-04-10 MED ORDER — PROPOFOL 10 MG/ML IV BOLUS
INTRAVENOUS | Status: AC
  Filled 2014-04-10: qty 20

## 2014-04-10 MED ORDER — FENTANYL CITRATE 0.05 MG/ML IJ SOLN: 25.0000 ug | INTRAMUSCULAR | Status: DC | PRN

## 2014-04-10 MED ORDER — OXYCODONE HCL 5 MG/5ML PO SOLN: 5.0000 mg | Freq: Once | ORAL | Status: DC | PRN

## 2014-04-10 MED ORDER — MIDAZOLAM HCL 5 MG/5ML IJ SOLN
INTRAMUSCULAR | Status: DC | PRN
  Administered 2014-04-10: 2 mg via INTRAVENOUS

## 2014-04-10 MED ORDER — FENTANYL CITRATE 0.05 MG/ML IJ SOLN
INTRAMUSCULAR | Status: AC
  Filled 2014-04-10: qty 5

## 2014-04-10 MED ORDER — CIPROFLOXACIN IN D5W 400 MG/200ML IV SOLN
400.0000 mg | INTRAVENOUS | Status: AC
  Administered 2014-04-10: 400 mg via INTRAVENOUS
  Filled 2014-04-10: qty 200

## 2014-04-10 MED ORDER — OXYCODONE HCL 5 MG PO TABS: 5.0000 mg | ORAL_TABLET | Freq: Once | ORAL | Status: DC | PRN

## 2014-04-10 MED ORDER — ONDANSETRON HCL 4 MG/2ML IJ SOLN: 4.0000 mg | Freq: Once | INTRAMUSCULAR | Status: DC | PRN

## 2014-04-10 MED ORDER — ONDANSETRON HCL 4 MG/2ML IJ SOLN
INTRAMUSCULAR | Status: AC
  Filled 2014-04-10: qty 2

## 2014-04-10 MED ORDER — LACTATED RINGERS IV SOLN
INTRAVENOUS | Status: DC | PRN
  Administered 2014-04-10: 07:00:00 via INTRAVENOUS

## 2014-04-10 SURGICAL SUPPLY — 38 items
BLADE SURG 15 STRL LF DISP TIS (BLADE) IMPLANT
BLADE SURG 15 STRL SS (BLADE) ×2
CHLORAPREP W/TINT 10.5 ML (MISCELLANEOUS) ×2 IMPLANT
COVER SURGICAL LIGHT HANDLE (MISCELLANEOUS) ×2 IMPLANT
DECANTER SPIKE VIAL GLASS SM (MISCELLANEOUS) ×2 IMPLANT
DRAPE PED LAPAROTOMY (DRAPES) ×2 IMPLANT
DRSG TEGADERM 4X4.75 (GAUZE/BANDAGES/DRESSINGS) ×1 IMPLANT
ELECT CAUTERY BLADE 6.4 (BLADE) ×2 IMPLANT
ELECT REM PT RETURN 9FT ADLT (ELECTROSURGICAL) ×2
ELECTRODE REM PT RTRN 9FT ADLT (ELECTROSURGICAL) ×1 IMPLANT
GAUZE PACKING IODOFORM 1/4X15 (GAUZE/BANDAGES/DRESSINGS) ×1 IMPLANT
GAUZE SPONGE 2X2 8PLY STRL LF (GAUZE/BANDAGES/DRESSINGS) IMPLANT
GAUZE SPONGE 4X4 16PLY XRAY LF (GAUZE/BANDAGES/DRESSINGS) ×2 IMPLANT
GLOVE BIO SURGEON STRL SZ7 (GLOVE) ×2 IMPLANT
GLOVE BIOGEL PI IND STRL 7.0 (GLOVE) IMPLANT
GLOVE BIOGEL PI IND STRL 7.5 (GLOVE) ×1 IMPLANT
GLOVE BIOGEL PI INDICATOR 7.0 (GLOVE) ×1
GLOVE BIOGEL PI INDICATOR 7.5 (GLOVE) ×1
GLOVE SURG SS PI 7.0 STRL IVOR (GLOVE) ×2 IMPLANT
GOWN STRL REUS W/ TWL LRG LVL3 (GOWN DISPOSABLE) ×2 IMPLANT
GOWN STRL REUS W/TWL LRG LVL3 (GOWN DISPOSABLE) ×4
KIT BASIN OR (CUSTOM PROCEDURE TRAY) ×2 IMPLANT
KIT ROOM TURNOVER OR (KITS) ×2 IMPLANT
LIQUID BAND (GAUZE/BANDAGES/DRESSINGS) ×2 IMPLANT
NDL HYPO 25GX1X1/2 BEV (NEEDLE) ×1 IMPLANT
NEEDLE HYPO 25GX1X1/2 BEV (NEEDLE) ×2 IMPLANT
NS IRRIG 1000ML POUR BTL (IV SOLUTION) ×2 IMPLANT
PACK SURGICAL SETUP 50X90 (CUSTOM PROCEDURE TRAY) ×2 IMPLANT
PAD ARMBOARD 7.5X6 YLW CONV (MISCELLANEOUS) ×4 IMPLANT
PENCIL BUTTON HOLSTER BLD 10FT (ELECTRODE) ×2 IMPLANT
SPONGE GAUZE 2X2 STER 10/PKG (GAUZE/BANDAGES/DRESSINGS) ×1
SUT MNCRL AB 4-0 PS2 18 (SUTURE) ×2 IMPLANT
SUT VIC AB 3-0 SH 27 (SUTURE) ×2
SUT VIC AB 3-0 SH 27X BRD (SUTURE) ×1 IMPLANT
SYR CONTROL 10ML LL (SYRINGE) ×2 IMPLANT
TOWEL OR 17X24 6PK STRL BLUE (TOWEL DISPOSABLE) ×2 IMPLANT
TOWEL OR 17X26 10 PK STRL BLUE (TOWEL DISPOSABLE) ×2 IMPLANT
WATER STERILE IRR 1000ML POUR (IV SOLUTION) IMPLANT

## 2014-04-10 NOTE — Discharge Instructions (Signed)
Remove dressing in 48 hours and then may remove wick that is in the incision. This will drain for a while until heals and keep covered until stops draining.  Place gauze over wound until stops draining.  What to eat:  For your first meals, you should eat lightly; only small meals initially.  If you do not have nausea, you may eat larger meals.  Avoid spicy, greasy and heavy food.    General Anesthesia, Adult, Care After  Refer to this sheet in the next few weeks. These instructions provide you with information on caring for yourself after your procedure. Your health care provider may also give you more specific instructions. Your treatment has been planned according to current medical practices, but problems sometimes occur. Call your health care provider if you have any problems or questions after your procedure.  WHAT TO EXPECT AFTER THE PROCEDURE  After the procedure, it is typical to experience:  Sleepiness.  Nausea and vomiting. HOME CARE INSTRUCTIONS  For the first 24 hours after general anesthesia:  Have a responsible person with you.  Do not drive a car. If you are alone, do not take public transportation.  Do not drink alcohol.  Do not take medicine that has not been prescribed by your health care provider.  Do not sign important papers or make important decisions.  You may resume a normal diet and activities as directed by your health care provider.  Change bandages (dressings) as directed.  If you have questions or problems that seem related to general anesthesia, call the hospital and ask for the anesthetist or anesthesiologist on call. SEEK MEDICAL CARE IF:  You have nausea and vomiting that continue the day after anesthesia.  You develop a rash. SEEK IMMEDIATE MEDICAL CARE IF:  You have difficulty breathing.  You have chest pain.  You have any allergic problems. Document Released: 04/20/2000 Document Revised: 09/14/2012 Document Reviewed: 07/28/2012  [facility] Patient  Information 2014 [place], [place].   Sore Throat    A sore throat is a painful, burning, sore, or scratchy feeling of the throat. There may be pain or tenderness when swallowing or talking. You may have other symptoms with a sore throat. These include coughing, sneezing, fever, or a swollen neck. A sore throat is often the first sign of another sickness. These sicknesses may include a cold, flu, strep throat, or an infection called mono. Most sore throats go away without medical treatment.  HOME CARE  Only take medicine as told by your doctor.  Drink enough fluids to keep your pee (urine) clear or pale yellow.  Rest as needed.  Try using throat sprays, lozenges, or suck on hard candy (if older than 4 years or as told).  Sip warm liquids, such as broth, herbal tea, or warm water with honey. Try sucking on frozen ice pops or drinking cold liquids.  Rinse the mouth (gargle) with salt water. Mix 1 teaspoon salt with 8 ounces of water.  Do not smoke. Avoid being around others when they are smoking.  Put a humidifier in your bedroom at night to moisten the air. You can also turn on a hot shower and sit in the bathroom for 5-10 minutes. Be sure the bathroom door is closed. GET HELP RIGHT AWAY IF:  You have trouble breathing.  You cannot swallow fluids, soft foods, or your spit (saliva).  You have more puffiness (swelling) in the throat.  Your sore throat does not get better in 7 days.  You feel sick  to your stomach (nauseous) and throw up (vomit).  You have a fever or lasting symptoms for more than 2-3 days.  You have a fever and your symptoms suddenly get worse. MAKE SURE YOU:  Understand these instructions.  Will watch your condition.  Will get help right away if you are not doing well or get worse. Document Released: 10/22/2007 Document Revised: 10/07/2011 Document Reviewed: 09/20/2011  [facility] Patient Information 2015 [place], [place]. This information is not intended to replace advice given  to you by your health care provider. Make sure you discuss any questions you have with your health care provider.

## 2014-04-10 NOTE — Anesthesia Postprocedure Evaluation (Signed)
  Anesthesia Post-op Note  Patient: Cheryl Moore  Procedure(s) Performed: Procedure(s): REMOVAL PORT-A-CATH (Left)  Patient Location: PACU  Anesthesia Type:General  Level of Consciousness: awake, alert  and oriented  Airway and Oxygen Therapy: Patient Spontanous Breathing  Post-op Pain: none  Post-op Assessment: Post-op Vital signs reviewed, Patient's Cardiovascular Status Stable, Respiratory Function Stable, Patent Airway and Pain level controlled  Post-op Vital Signs: stable  Last Vitals:  Filed Vitals:   04/10/14 0914  BP: 153/82  Pulse: 58  Temp:   Resp: 15    Complications: No apparent anesthesia complications

## 2014-04-10 NOTE — Anesthesia Preprocedure Evaluation (Signed)
Anesthesia Evaluation  Patient identified by MRN, date of birth, ID band Patient awake    Reviewed: Allergy & Precautions, NPO status , Patient's Chart, lab work & pertinent test results  Airway Mallampati: II  TM Distance: >3 FB Neck ROM: Full    Dental  (+) Teeth Intact, Dental Advisory Given   Pulmonary former smoker,    breath sounds clear to auscultation       Cardiovascular  Rhythm:Regular Rate:Normal     Neuro/Psych    GI/Hepatic   Endo/Other    Renal/GU      Musculoskeletal   Abdominal   Peds  Hematology   Anesthesia Other Findings   Reproductive/Obstetrics                             Anesthesia Physical Anesthesia Plan  ASA: II  Anesthesia Plan: General   Post-op Pain Management:    Induction: Intravenous  Airway Management Planned: LMA  Additional Equipment:   Intra-op Plan:   Post-operative Plan:   Informed Consent: I have reviewed the patients History and Physical, chart, labs and discussed the procedure including the risks, benefits and alternatives for the proposed anesthesia with the patient or authorized representative who has indicated his/her understanding and acceptance.   Dental advisory given  Plan Discussed with: CRNA and Anesthesiologist  Anesthesia Plan Comments:         Anesthesia Quick Evaluation  

## 2014-04-10 NOTE — Interval H&P Note (Signed)
History and Physical Interval Note:  04/10/2014 7:02 AM  Cheryl Moore  has presented today for surgery, with the diagnosis of Infected port  The various methods of treatment have been discussed with the patient and family. After consideration of risks, benefits and other options for treatment, the patient has consented to  Procedure(s): REMOVAL PORT-A-CATH (N/A) as a surgical intervention .  The patient's history has been reviewed, patient examined, no change in status, stable for surgery.  I have reviewed the patient's chart and labs.  Questions were answered to the patient's satisfaction.     Emera Bussie

## 2014-04-10 NOTE — H&P (Signed)
Cheryl Moore is an 54 y.o. female.   Chief Complaint: history infected port HPI: 44 yof with stage IV breast cancer who has port in place. I saw her earlier this month and she had infection around it that was easily treated with clinda. She has recurrence and had small abscess drained by my partner Dr Grandville Silos Monday. She returns today without fever or any real complaints. She still has a wick in place   Past Medical History  Diagnosis Date  . Breast cancer     right  . Anxiety   . Hot flashes   . History of radiation therapy 11/28/12-03/06/13    right breast/64.4Gy/ right hip=37.5Gy   . Obesity   . Personal history of colonic polyps - ssp and adenomas 09/19/2013    Past Surgical History  Procedure Laterality Date  . Cesarean section  2005  . Breast surgery Right     breast bx  . Breast biopsy Right 05/23/2012    Procedure: SKIN PUNCH BIOPSY RIGHT BREAST;  Surgeon: [name], MD;  Location: [place];  Service: General;  Laterality: Right;  . Portacath placement Left 05/23/2012    Procedure: INSERTION PORT-A-CATH;  Surgeon: [name], MD;  Location: [place];  Service: General;  Laterality: Left;  . Total mastectomy Left 10/26/2012    Dr Donne Hazel  . Simple mastectomy with axillary sentinel node biopsy Left 10/26/2012    Procedure: TOTAL MASTECTOMY;  Surgeon: [name], MD;  Location: [place];  Service: General;  Laterality: Left;  Marland Kitchen Mastectomy modified radical Right 10/26/2012    Procedure: MASTECTOMY MODIFIED RADICAL;  Surgeon: [name], MD;  Location: [place];  Service: General;  Laterality: Right;  . Radiology with anesthesia N/A 08/24/2013    Procedure: MRI;  Surgeon: Medication Radiologist, MD;  Location: [place];  Service: Radiology;  Laterality: N/A;    Family History  Problem Relation Age of Onset  . Adopted: Yes   Social History:  reports that she quit smoking about 11 years ago. Her smoking use included Cigarettes. She has a 1.25 pack-year smoking  history. She has never used smokeless tobacco. She reports that she drinks alcohol. She reports that she does not use illicit drugs.  Allergies:  Allergies  Allergen Reactions  . Other Other (See Comments)    Paper tape  . Penicillins Swelling    "Throat swells shut"    Medications Prior to Admission  Medication Sig Dispense Refill  . ALPRAZolam (XANAX) 0.25 MG tablet Take 0.25 mg by mouth at bedtime as needed for anxiety or sleep.    . Ascorbic Acid (VITAMIN C PO) Take 1 tablet by mouth daily.     Marland Kitchen CALCIUM PO Take 1 tablet by mouth daily.    . clindamycin (CLEOCIN) 300 MG capsule Take 300 mg by mouth 3 (three) times daily.   0  . denosumab (XGEVA) 120 MG/1.7ML SOLN injection Inject 120 mg into the skin every 30 (thirty) days.    Marland Kitchen gabapentin (NEURONTIN) 100 MG capsule Take 100-300 mg by mouth 3 (three) times daily. Take 1 capsule in the morning. Take 1 capsule at midday and take 3 capsules at night    . goserelin (ZOLADEX) 10.8 MG injection Inject 10.8 mg into the skin every 30 (thirty) days.     Marland Kitchen ibuprofen (ADVIL,MOTRIN) 200 MG tablet Take 200 mg by mouth every 8 (eight) hours as needed for pain.     Marland Kitchen letrozole (FEMARA) 2.5 MG tablet Take 2.5 mg by mouth daily.    Marland Kitchen  LORazepam (ATIVAN) 0.5 MG tablet Take 1 tablet (0.5 mg total) by mouth daily as needed for anxiety. 30 tablet 3  . Multiple Vitamin (MULTIVITAMIN) tablet Take 1 tablet by mouth daily.    . palbociclib (IBRANCE) 125 MG capsule Take 1 capsule (125 mg total) by mouth See admin instructions. Takes whole with food.  Takes one tablets for 21 days, stops for 7 days, then repeats. 21 capsule 6  . prochlorperazine (COMPAZINE) 10 MG tablet Take 10 mg by mouth every 6 (six) hours as needed for nausea or vomiting.   2  . psyllium (METAMUCIL) 58.6 % powder Take 1 packet by mouth daily.    . traMADol (ULTRAM) 50 MG tablet Take 50 mg by mouth every 6 (six) hours as needed for moderate pain.    Marland Kitchen UNABLE TO FIND Rx: L8015-Post Mastectomy  Camisole (Quantity: 2) L8000- Post Surgical Bras (Quantity: 6) F7902- Non-Silicone Breast Prosthesis (Quantity: 2) I0973- Silicone Breast Prosthesis (Quantity: 2) Dx: 174.9; Bilateral mastectomy 1 each 0  . valACYclovir (VALTREX) 500 MG tablet Take 500 mg by mouth daily.    Marland Kitchen venlafaxine (EFFEXOR) 75 MG tablet Take 1 tablet (75 mg total) by mouth daily. 30 tablet 6    No results found for this or any previous visit (from the past 48 hour(s)). No results found.  ROS negative  Blood pressure 128/82, pulse 70, temperature 98.2 F (36.8 C), temperature source Oral, resp. rate 20, height 5\' 2"  (1.575 m), weight 233 lb (105.688 kg), SpO2 98 %. Physical Exam  Vitals (Sonya Bynum CMA; 04/04/2014 8:09 AM) 04/04/2014 8:09 AM Weight: 234 lb Height: 62in Body Surface Area: 2.15 m Body Mass Index: 42.8 kg/m Temp.: 97.30F(Temporal)  Pulse: 79 (Regular)  BP: 130/72 (Sitting, Left Arm, Standard)  Physical Exam [name] MD; 04/04/2014 8:25 AM) General Mental Status-Alert. Orientation-Oriented X3.  Chest and Lung Exam Note: left chest port with surrounding erythema and tender, no discrete abscess Assessment/Plan  Assessment & Plan [name] MD; 04/04/2014 8:26 AM) PORT OR RESERVOIR INFECTION (999.33  Z32.992E) Story: Her port needs to be removed. I discussed having one of my partners do this soon but we decided to wait until next week as I am out of town. Her husband has appt he needs to go to Monday also. She understands that if she has fevers or anything worsens she will need to call or go to er. I will remove port and leave open wound next week.   Cheryl Moore 04/10/2014, 6:59 AM

## 2014-04-10 NOTE — Op Note (Signed)
Preoperative diagnosis: port infection Postoperative diagnosis: same as above Procedure: port removal Surgeon: Dr Serita Grammes Anesthesia: general EBL minimal Complications: None Specimens: None Sponge count was correct at completion Suspicion to recovery stable  Indications: This a 54 year old female with a history of stage IV breast cancer who has a port in place. The port has had one skin infection resolved easily with antibiotics. She has recently had another episode which has been more difficult to clear and I recommended removing her port.  Procedure: After informed consent was obtained the patient was taken to the operating room. She was already on oral antibiotics and was given intravenous antibiotics prior to surgery. She was placed under anesthesia without complication. Her left chest was prepped and draped in the standard sterile surgical fashion. A surgical timeout was then performed.  I infiltrated Marcaine around the area of the port. There was a small ulceration at the lateral aspect of the old incision. I then reentered the old incision. The port was floating free in the cavity outside of the suture that was present. There was no purulence. I then removed the port and the line and there entirety. I then closed the portion of the dermis with 3-0 Vicryl. I did place a iodoform wick in this. This was not actively infected currently. I then closed the remainder the skin with 4-0 Monocryl in place glue over it. She tolerated this well and was transferred to recovery stable.

## 2014-04-10 NOTE — Transfer of Care (Signed)
Immediate Anesthesia Transfer of Care Note  Patient: Cheryl Moore  Procedure(s) Performed: Procedure(s): REMOVAL PORT-A-CATH (Left)  Patient Location: PACU  Anesthesia Type:General  Level of Consciousness: awake, alert , oriented, patient cooperative and responds to stimulation  Airway & Oxygen Therapy: Patient Spontanous Breathing  Post-op Assessment: Report given to RN and Post -op Vital signs reviewed and stable  Post vital signs: Reviewed and stable  Last Vitals:  Filed Vitals:   04/10/14 0620  BP: 128/82  Pulse: 70  Temp: 36.8 C  Resp: 20    Complications: No apparent anesthesia complications

## 2014-04-11 ENCOUNTER — Encounter (HOSPITAL_COMMUNITY): Payer: Self-pay | Admitting: General Surgery

## 2014-05-04 ENCOUNTER — Other Ambulatory Visit (HOSPITAL_BASED_OUTPATIENT_CLINIC_OR_DEPARTMENT_OTHER): Payer: BLUE CROSS/BLUE SHIELD

## 2014-05-04 ENCOUNTER — Other Ambulatory Visit: Payer: Self-pay | Admitting: Nurse Practitioner

## 2014-05-04 ENCOUNTER — Ambulatory Visit (HOSPITAL_BASED_OUTPATIENT_CLINIC_OR_DEPARTMENT_OTHER): Payer: BLUE CROSS/BLUE SHIELD

## 2014-05-04 VITALS — BP 122/67 | HR 60 | Temp 97.6°F

## 2014-05-04 DIAGNOSIS — C50419 Malignant neoplasm of upper-outer quadrant of unspecified female breast: Secondary | ICD-10-CM

## 2014-05-04 DIAGNOSIS — C50411 Malignant neoplasm of upper-outer quadrant of right female breast: Secondary | ICD-10-CM

## 2014-05-04 DIAGNOSIS — C7951 Secondary malignant neoplasm of bone: Secondary | ICD-10-CM | POA: Diagnosis not present

## 2014-05-04 DIAGNOSIS — Z5111 Encounter for antineoplastic chemotherapy: Secondary | ICD-10-CM

## 2014-05-04 LAB — CBC WITH DIFFERENTIAL/PLATELET
BASO%: 0.4 % (ref 0.0–2.0)
Basophils Absolute: 0 10*3/uL (ref 0.0–0.1)
EOS%: 0.8 % (ref 0.0–7.0)
Eosinophils Absolute: 0 10*3/uL (ref 0.0–0.5)
HEMATOCRIT: 39.2 % (ref 34.8–46.6)
HGB: 13.5 g/dL (ref 11.6–15.9)
LYMPH#: 1.6 10*3/uL (ref 0.9–3.3)
LYMPH%: 43.3 % (ref 14.0–49.7)
MCH: 34.9 pg — ABNORMAL HIGH (ref 25.1–34.0)
MCHC: 34.3 g/dL (ref 31.5–36.0)
MCV: 101.6 fL — ABNORMAL HIGH (ref 79.5–101.0)
MONO#: 0.4 10*3/uL (ref 0.1–0.9)
MONO%: 9.8 % (ref 0.0–14.0)
NEUT#: 1.7 10*3/uL (ref 1.5–6.5)
NEUT%: 45.7 % (ref 38.4–76.8)
Platelets: 302 10*3/uL (ref 145–400)
RBC: 3.86 10*6/uL (ref 3.70–5.45)
RDW: 15.1 % — ABNORMAL HIGH (ref 11.2–14.5)
WBC: 3.6 10*3/uL — ABNORMAL LOW (ref 3.9–10.3)

## 2014-05-04 LAB — COMPREHENSIVE METABOLIC PANEL (CC13)
ALBUMIN: 3.9 g/dL (ref 3.5–5.0)
ALK PHOS: 62 U/L (ref 40–150)
ALT: 30 U/L (ref 0–55)
AST: 20 U/L (ref 5–34)
Anion Gap: 14 mEq/L — ABNORMAL HIGH (ref 3–11)
BILIRUBIN TOTAL: 0.28 mg/dL (ref 0.20–1.20)
BUN: 12.6 mg/dL (ref 7.0–26.0)
CHLORIDE: 105 meq/L (ref 98–109)
CO2: 19 mEq/L — ABNORMAL LOW (ref 22–29)
Calcium: 9.8 mg/dL (ref 8.4–10.4)
Creatinine: 1 mg/dL (ref 0.6–1.1)
EGFR: 65 mL/min/{1.73_m2} — ABNORMAL LOW (ref >90)
Glucose: 92 mg/dl (ref 70–140)
POTASSIUM: 4.1 meq/L (ref 3.5–5.1)
SODIUM: 138 meq/L (ref 136–145)
TOTAL PROTEIN: 7 g/dL (ref 6.4–8.3)

## 2014-05-04 MED ORDER — DENOSUMAB 120 MG/1.7ML ~~LOC~~ SOLN
120.0000 mg | Freq: Once | SUBCUTANEOUS | Status: AC
  Administered 2014-05-04: 120 mg via SUBCUTANEOUS
  Filled 2014-05-04: qty 1.7

## 2014-05-04 MED ORDER — GOSERELIN ACETATE 3.6 MG ~~LOC~~ IMPL
3.6000 mg | DRUG_IMPLANT | Freq: Once | SUBCUTANEOUS | Status: AC
  Administered 2014-05-04: 3.6 mg via SUBCUTANEOUS
  Filled 2014-05-04: qty 3.6

## 2014-05-07 ENCOUNTER — Encounter: Payer: Self-pay | Admitting: Obstetrics and Gynecology

## 2014-05-07 ENCOUNTER — Ambulatory Visit (INDEPENDENT_AMBULATORY_CARE_PROVIDER_SITE_OTHER): Payer: BLUE CROSS/BLUE SHIELD | Admitting: Obstetrics and Gynecology

## 2014-05-07 ENCOUNTER — Ambulatory Visit: Payer: Self-pay | Admitting: Obstetrics and Gynecology

## 2014-05-07 VITALS — BP 140/78 | HR 80 | Resp 16 | Ht 63.0 in | Wt 236.2 lb

## 2014-05-07 DIAGNOSIS — Z01419 Encounter for gynecological examination (general) (routine) without abnormal findings: Secondary | ICD-10-CM | POA: Diagnosis not present

## 2014-05-07 DIAGNOSIS — Z0000 Encounter for general adult medical examination without abnormal findings: Secondary | ICD-10-CM

## 2014-05-07 LAB — POCT URINALYSIS DIPSTICK
Bilirubin, UA: NEGATIVE
Blood, UA: NEGATIVE
Glucose, UA: NEGATIVE
KETONES UA: NEGATIVE
Leukocytes, UA: NEGATIVE
Nitrite, UA: NEGATIVE
PH UA: 5
PROTEIN UA: NEGATIVE
Urobilinogen, UA: NEGATIVE

## 2014-05-07 NOTE — Patient Instructions (Addendum)
Cheryl Moore,   It is great to see you doing well! I will look forward to seeing you for your visit next year!  Cheryl Half, MD   EXERCISE AND DIET:  We recommended that you start or continue a regular exercise program for good health. Regular exercise means any activity that makes your heart beat faster and makes you sweat.  We recommend exercising at least 30 minutes per day at least 3 days a week, preferably 4 or 5.  We also recommend a diet low in fat and sugar.  Inactivity, poor dietary choices and obesity can cause diabetes, heart attack, stroke, and kidney damage, among others.    ALCOHOL AND SMOKING:  Women should limit their alcohol intake to no more than 7 drinks/beers/glasses of wine (combined, not each!) per week. Moderation of alcohol intake to this level decreases your risk of breast cancer and liver damage. And of course, no recreational drugs are part of a healthy lifestyle.  And absolutely no smoking or even second hand smoke. Most people know smoking can cause heart and lung diseases, but did you know it also contributes to weakening of your bones? Aging of your skin?  Yellowing of your teeth and nails?  CALCIUM AND VITAMIN D:  Adequate intake of calcium and Vitamin D are recommended.  The recommendations for exact amounts of these supplements seem to change often, but generally speaking 600 mg of calcium (either carbonate or citrate) and 800 units of Vitamin D per day seems prudent. Certain women may benefit from higher intake of Vitamin D.  If you are among these women, your doctor will have told you during your visit.    PAP SMEARS:  Pap smears, to check for cervical cancer or precancers,  have traditionally been done yearly, although recent scientific advances have shown that most women can have pap smears less often.  However, every woman still should have a physical exam from her gynecologist every year. It will include a breast check, inspection of the vulva and vagina to check for  abnormal growths or skin changes, a visual exam of the cervix, and then an exam to evaluate the size and shape of the uterus and ovaries.  And after 54 years of age, a rectal exam is indicated to check for rectal cancers. We will also provide age appropriate advice regarding health maintenance, like when you should have certain vaccines, screening for sexually transmitted diseases, bone density testing, colonoscopy, mammograms, etc.   MAMMOGRAMS:  All women over 54 years old should have a yearly mammogram. Many facilities now offer a "3D" mammogram, which may cost around $50 extra out of pocket. If possible,  we recommend you accept the option to have the 3D mammogram performed.  It both reduces the number of women who will be called back for extra views which then turn out to be normal, and it is better than the routine mammogram at detecting truly abnormal areas.    COLONOSCOPY:  Colonoscopy to screen for colon cancer is recommended for all women at age 54.  We know, you hate the idea of the prep.  We agree, BUT, having colon cancer and not knowing it is worse!!  Colon cancer so often starts as a polyp that can be seen and removed at colonscopy, which can quite literally save your life!  And if your first colonoscopy is normal and you have no family history of colon cancer, most women don't have to have it again for 10 years.  Once every  ten years, you can do something that may end up saving your life, right?  We will be happy to help you get it scheduled when you are ready.  Be sure to check your insurance coverage so you understand how much it will cost.  It may be covered as a preventative service at no cost, but you should check your particular policy.

## 2014-05-07 NOTE — Progress Notes (Signed)
Patient ID: Cheryl Moore, female   DOB: Jul 13, 1960, 54 y.o.   MRN: [id number] 54 y.o. G1P1001 MarriedCaucasianF here for annual exam.   PCP:  None  Has dry and irritated vagina.  Water based lubricants not helping.  Used Precede.  It did not help.   Diagnosis of metastatic breast cancer. Just had port a cath removed due to infection. Has PET scan next month.  Maintaining a positive attitude.  Patient's last menstrual period was 04/26/2012.          Sexually active: Yes.   female partner The current method of family planning is none.    Exercising: Yes.    walking, gardening and swimming. Smoker:  no  Health Maintenance: Pap:  05-03-13 wnl:neg HR HPV History of abnormal Pap:  no MMG:  10/2012 bilateral mastectomy--metastatic Breast CA to bones and lymph nodes. Colonoscopy:  09-12-13 tubular adenomatous polyps BMD:   n/a TDaP:  05-02-12 Screening Labs:  Hb today: Oncologist, Urine today: Neg   reports that she quit smoking about 11 years ago. Her smoking use included Cigarettes. She has a 1.25 pack-year smoking history. She has never used smokeless tobacco. She reports that she does not drink alcohol or use illicit drugs.  Past Medical History  Diagnosis Date  . Breast cancer     right  . Anxiety   . Hot flashes   . History of radiation therapy 11/28/12-03/06/13    right breast/64.4Gy/ right hip=37.5Gy   . Obesity   . Personal history of colonic polyps - ssp and adenomas 09/19/2013    Past Surgical History  Procedure Laterality Date  . Cesarean section  2005  . Breast surgery Right     breast bx  . Breast biopsy Right 05/23/2012    Procedure: SKIN PUNCH BIOPSY RIGHT BREAST;  Surgeon: [name], MD;  Location: [place];  Service: General;  Laterality: Right;  . Portacath placement Left 05/23/2012    Procedure: INSERTION PORT-A-CATH;  Surgeon: [name], MD;  Location: [place];  Service: General;  Laterality: Left;  . Total mastectomy Left 10/26/2012    Dr Donne Hazel  .  Simple mastectomy with axillary sentinel node biopsy Left 10/26/2012    Procedure: TOTAL MASTECTOMY;  Surgeon: [name], MD;  Location: [place];  Service: General;  Laterality: Left;  Marland Kitchen Mastectomy modified radical Right 10/26/2012    Procedure: MASTECTOMY MODIFIED RADICAL;  Surgeon: [name], MD;  Location: [place];  Service: General;  Laterality: Right;  . Radiology with anesthesia N/A 08/24/2013    Procedure: MRI;  Surgeon: Medication Radiologist, MD;  Location: [place];  Service: Radiology;  Laterality: N/A;  . Port-a-cath removal Left 04/10/2014    Procedure: REMOVAL PORT-A-CATH;  Surgeon: [name], MD;  Location: [place];  Service: General;  Laterality: Left;    Current Outpatient Prescriptions  Medication Sig Dispense Refill  . ALPRAZolam (XANAX) 0.25 MG tablet Take 0.25 mg by mouth at bedtime as needed for anxiety or sleep.    . Ascorbic Acid (VITAMIN C PO) Take 1 tablet by mouth daily.     Marland Kitchen CALCIUM PO Take 1 tablet by mouth daily.    Marland Kitchen denosumab (XGEVA) 120 MG/1.7ML SOLN injection Inject 120 mg into the skin every 30 (thirty) days.    Marland Kitchen gabapentin (NEURONTIN) 100 MG capsule Take 100-300 mg by mouth 3 (three) times daily. Take 1 capsule in the morning. Take 1 capsule at midday and take 3 capsules at night    . goserelin (ZOLADEX) 10.8 MG  injection Inject 10.8 mg into the skin every 30 (thirty) days.     Marland Kitchen ibuprofen (ADVIL,MOTRIN) 200 MG tablet Take 200 mg by mouth every 8 (eight) hours as needed for pain.     Marland Kitchen letrozole (FEMARA) 2.5 MG tablet Take 2.5 mg by mouth daily.    Marland Kitchen LORazepam (ATIVAN) 0.5 MG tablet Take 1 tablet (0.5 mg total) by mouth daily as needed for anxiety. 30 tablet 3  . Multiple Vitamin (MULTIVITAMIN) tablet Take 1 tablet by mouth daily.    . palbociclib (IBRANCE) 125 MG capsule Take 1 capsule (125 mg total) by mouth See admin instructions. Takes whole with food.  Takes one tablets for 21 days, stops for 7 days, then repeats. 21 capsule 6  . psyllium  (METAMUCIL) 58.6 % powder Take 1 packet by mouth daily.    . traMADol (ULTRAM) 50 MG tablet Take 50 mg by mouth every 6 (six) hours as needed for moderate pain.    Marland Kitchen UNABLE TO FIND Rx: L8015-Post Mastectomy Camisole (Quantity: 2) L8000- Post Surgical Bras (Quantity: 6) U9323- Non-Silicone Breast Prosthesis (Quantity: 2) F5732- Silicone Breast Prosthesis (Quantity: 2) Dx: 174.9; Bilateral mastectomy 1 each 0  . venlafaxine (EFFEXOR) 75 MG tablet Take 1 tablet (75 mg total) by mouth daily. 30 tablet 6  . prochlorperazine (COMPAZINE) 10 MG tablet   1   No current facility-administered medications for this visit.    Family History  Problem Relation Age of Onset  . Adopted: Yes    ROS:  Pertinent items are noted in HPI.  Otherwise, a comprehensive ROS was negative.  Exam:   BP 140/78 mmHg  Pulse 80  Resp 16  Ht 5\' 3"  (1.6 m)  Wt 236 lb 3.2 oz (107.14 kg)  BMI 41.85 kg/m2  LMP 04/26/2012     Height: 5\' 3"  (160 cm)  Ht Readings from Last 3 Encounters:  05/07/14 5\' 3"  (1.6 m)  04/10/14 5\' 2"  (1.575 m)  12/15/13 5\' 2"  (1.575 m)    General appearance: alert, cooperative and appears stated age Head: Normocephalic, without obvious abnormality, atraumatic Neck: no adenopathy, supple, symmetrical, trachea midline and thyroid normal to inspection and palpation Lungs: clear to auscultation bilaterally Breasts: normal appearance, no masses or tenderness,  Absent breasts bilaterally.  No masses of chest wall and axillary regions.  Heart: regular rate and rhythm Abdomen: obese, soft, non-tender; bowel sounds normal; no masses,  no organomegaly Extremities: extremities normal, atraumatic, no cyanosis or edema Skin: Skin color, texture, turgor normal. No rashes or lesions Lymph nodes: Cervical, supraclavicular, and axillary nodes normal. No abnormal inguinal nodes palpated Neurologic: Grossly normal   Pelvic: External genitalia:  no lesions              Urethra:  normal appearing urethra  with no masses, tenderness or lesions              Bartholins and Skenes: normal                 Vagina: normal appearing vagina with normal color and discharge, no lesions              Cervix: no lesions              Pap taken: No. Bimanual Exam:  Uterus:  normal size, contour, position, consistency, mobility, non-tender   Exam limited by body habitus.               Adnexa: normal adnexa and no mass, fullness, tenderness  Rectovaginal: Confirms               Anus:  normal sphincter tone, no lesions  Chaperone was present for exam.  A:  [facility] visit. History of metastatic breast cancer.  Status post bilateral mastectomy, radiation and chemotherapy. On Letrazole and Goserelin. Vaginal atrophy symptoms.   P:   pap smear not indicated.  Discussed cook oils for lubrication.  PET scan next month.  Support given.  return annually or prn

## 2014-05-29 ENCOUNTER — Ambulatory Visit (HOSPITAL_COMMUNITY)
Admission: RE | Admit: 2014-05-29 | Discharge: 2014-05-29 | Disposition: A | Discharge status: Home / Self Care | Payer: BLUE CROSS/BLUE SHIELD | Source: Ambulatory Visit | Attending: Nurse Practitioner | Admitting: Nurse Practitioner

## 2014-05-29 DIAGNOSIS — C50911 Malignant neoplasm of unspecified site of right female breast: Secondary | ICD-10-CM | POA: Diagnosis not present

## 2014-05-29 DIAGNOSIS — Z08 Encounter for follow-up examination after completed treatment for malignant neoplasm: Secondary | ICD-10-CM | POA: Insufficient documentation

## 2014-05-29 DIAGNOSIS — C7951 Secondary malignant neoplasm of bone: Secondary | ICD-10-CM | POA: Diagnosis not present

## 2014-05-29 DIAGNOSIS — K760 Fatty (change of) liver, not elsewhere classified: Secondary | ICD-10-CM | POA: Diagnosis not present

## 2014-05-29 DIAGNOSIS — C50411 Malignant neoplasm of upper-outer quadrant of right female breast: Secondary | ICD-10-CM

## 2014-05-29 LAB — GLUCOSE, CAPILLARY: GLUCOSE-CAPILLARY: 109 mg/dL — AB (ref 70–99)

## 2014-05-29 MED ORDER — FLUDEOXYGLUCOSE F - 18 (FDG) INJECTION
11.6500 | Freq: Once | INTRAVENOUS | Status: AC | PRN
  Administered 2014-05-29: 11.65 via INTRAVENOUS

## 2014-05-31 ENCOUNTER — Ambulatory Visit (HOSPITAL_BASED_OUTPATIENT_CLINIC_OR_DEPARTMENT_OTHER): Payer: BLUE CROSS/BLUE SHIELD | Admitting: Oncology

## 2014-05-31 ENCOUNTER — Other Ambulatory Visit (HOSPITAL_BASED_OUTPATIENT_CLINIC_OR_DEPARTMENT_OTHER): Payer: BLUE CROSS/BLUE SHIELD

## 2014-05-31 ENCOUNTER — Ambulatory Visit: Payer: BLUE CROSS/BLUE SHIELD

## 2014-05-31 ENCOUNTER — Telehealth: Payer: Self-pay | Admitting: Nurse Practitioner

## 2014-05-31 VITALS — BP 158/61 | HR 79 | Temp 98.2°F | Resp 18 | Ht 63.0 in | Wt 236.6 lb

## 2014-05-31 DIAGNOSIS — C773 Secondary and unspecified malignant neoplasm of axilla and upper limb lymph nodes: Secondary | ICD-10-CM

## 2014-05-31 DIAGNOSIS — C50411 Malignant neoplasm of upper-outer quadrant of right female breast: Secondary | ICD-10-CM

## 2014-05-31 DIAGNOSIS — C50419 Malignant neoplasm of upper-outer quadrant of unspecified female breast: Secondary | ICD-10-CM

## 2014-05-31 DIAGNOSIS — E669 Obesity, unspecified: Secondary | ICD-10-CM

## 2014-05-31 DIAGNOSIS — C7951 Secondary malignant neoplasm of bone: Secondary | ICD-10-CM | POA: Diagnosis not present

## 2014-05-31 DIAGNOSIS — C50211 Malignant neoplasm of upper-inner quadrant of right female breast: Secondary | ICD-10-CM

## 2014-05-31 DIAGNOSIS — R0600 Dyspnea, unspecified: Secondary | ICD-10-CM

## 2014-05-31 DIAGNOSIS — Z86010 Personal history of colonic polyps: Secondary | ICD-10-CM

## 2014-05-31 DIAGNOSIS — R110 Nausea: Secondary | ICD-10-CM

## 2014-05-31 LAB — CBC WITH DIFFERENTIAL/PLATELET
BASO%: 1.2 % (ref 0.0–2.0)
Basophils Absolute: 0 10*3/uL (ref 0.0–0.1)
EOS%: 0.6 % (ref 0.0–7.0)
Eosinophils Absolute: 0 10*3/uL (ref 0.0–0.5)
HCT: 38 % (ref 34.8–46.6)
HGB: 13.3 g/dL (ref 11.6–15.9)
LYMPH#: 1.3 10*3/uL (ref 0.9–3.3)
LYMPH%: 40.9 % (ref 14.0–49.7)
MCH: 35.6 pg — ABNORMAL HIGH (ref 25.1–34.0)
MCHC: 35 g/dL (ref 31.5–36.0)
MCV: 101.6 fL — ABNORMAL HIGH (ref 79.5–101.0)
MONO#: 0.4 10*3/uL (ref 0.1–0.9)
MONO%: 11.4 % (ref 0.0–14.0)
NEUT#: 1.5 10*3/uL (ref 1.5–6.5)
NEUT%: 45.9 % (ref 38.4–76.8)
NRBC: 0 % (ref 0–0)
Platelets: 205 10*3/uL (ref 145–400)
RBC: 3.74 10*6/uL (ref 3.70–5.45)
RDW: 14.3 % (ref 11.2–14.5)
WBC: 3.3 10*3/uL — ABNORMAL LOW (ref 3.9–10.3)

## 2014-05-31 LAB — COMPREHENSIVE METABOLIC PANEL (CC13)
ALT: 45 U/L (ref 0–55)
AST: 27 U/L (ref 5–34)
Albumin: 3.8 g/dL (ref 3.5–5.0)
Alkaline Phosphatase: 53 U/L (ref 40–150)
Anion Gap: 13 mEq/L — ABNORMAL HIGH (ref 3–11)
BUN: 13.9 mg/dL (ref 7.0–26.0)
CHLORIDE: 108 meq/L (ref 98–109)
CO2: 17 mEq/L — ABNORMAL LOW (ref 22–29)
Calcium: 9.5 mg/dL (ref 8.4–10.4)
Creatinine: 0.9 mg/dL (ref 0.6–1.1)
EGFR: 75 mL/min/{1.73_m2} — ABNORMAL LOW (ref >90)
Glucose: 171 mg/dl — ABNORMAL HIGH (ref 70–140)
Potassium: 4.3 mEq/L (ref 3.5–5.1)
Sodium: 138 mEq/L (ref 136–145)
TOTAL PROTEIN: 6.7 g/dL (ref 6.4–8.3)
Total Bilirubin: 0.31 mg/dL (ref 0.20–1.20)

## 2014-05-31 MED ORDER — DENOSUMAB 120 MG/1.7ML ~~LOC~~ SOLN
120.0000 mg | Freq: Once | SUBCUTANEOUS | Status: DC
  Filled 2014-05-31: qty 1.7

## 2014-05-31 MED ORDER — ALPRAZOLAM 0.25 MG PO TABS: 0.2500 mg | ORAL_TABLET | Freq: Every evening | ORAL | Status: DC | PRN

## 2014-05-31 MED ORDER — GABAPENTIN 300 MG PO CAPS: 300.0000 mg | ORAL_CAPSULE | Freq: Every day | ORAL | Status: DC

## 2014-05-31 MED ORDER — GOSERELIN ACETATE 3.6 MG ~~LOC~~ IMPL
3.6000 mg | DRUG_IMPLANT | Freq: Once | SUBCUTANEOUS | Status: DC
  Filled 2014-05-31: qty 3.6

## 2014-05-31 MED ORDER — TRAMADOL HCL 50 MG PO TABS: 50.0000 mg | ORAL_TABLET | Freq: Four times a day (QID) | ORAL | Status: DC | PRN

## 2014-05-31 MED ORDER — VENLAFAXINE HCL 75 MG PO TABS: 75.0000 mg | ORAL_TABLET | Freq: Every day | ORAL | Status: DC

## 2014-05-31 NOTE — Telephone Encounter (Signed)
Appointments made and avs printed for patient °
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
## 2014-05-31 NOTE — Progress Notes (Signed)
Pt called several times once being arrived to injection nurse.  Unable to locate pt.  Pt not given xgeva or zoladex injections today.  Pt home number called with no answer

## 2014-06-01 ENCOUNTER — Other Ambulatory Visit: Payer: Self-pay | Admitting: Nurse Practitioner

## 2014-06-01 ENCOUNTER — Other Ambulatory Visit: Payer: Self-pay | Admitting: Oncology

## 2014-06-01 ENCOUNTER — Telehealth: Payer: Self-pay | Admitting: *Deleted

## 2014-06-01 NOTE — Telephone Encounter (Signed)
VM message from patient stating that she left the cancer center yesterday, completely forgetting to get injections yesterday (was to get xgeva and zoladex). She wanted to know when to come back and get it.   Call back to pt and left VM-if she can come in before 4pm she could get it today, otherwise we would have to schedule it for next week. Left instructions for patient to call back to [phone].

## 2014-06-01 NOTE — Telephone Encounter (Signed)
Thank you :)

## 2014-06-05 ENCOUNTER — Ambulatory Visit (HOSPITAL_BASED_OUTPATIENT_CLINIC_OR_DEPARTMENT_OTHER): Payer: BLUE CROSS/BLUE SHIELD

## 2014-06-05 VITALS — BP 119/67 | HR 67 | Temp 97.7°F

## 2014-06-05 DIAGNOSIS — C50411 Malignant neoplasm of upper-outer quadrant of right female breast: Secondary | ICD-10-CM

## 2014-06-05 DIAGNOSIS — C773 Secondary and unspecified malignant neoplasm of axilla and upper limb lymph nodes: Secondary | ICD-10-CM

## 2014-06-05 DIAGNOSIS — Z5111 Encounter for antineoplastic chemotherapy: Secondary | ICD-10-CM | POA: Diagnosis not present

## 2014-06-05 DIAGNOSIS — C50211 Malignant neoplasm of upper-inner quadrant of right female breast: Secondary | ICD-10-CM | POA: Diagnosis not present

## 2014-06-05 DIAGNOSIS — C7951 Secondary malignant neoplasm of bone: Secondary | ICD-10-CM | POA: Diagnosis not present

## 2014-06-05 MED ORDER — DENOSUMAB 120 MG/1.7ML ~~LOC~~ SOLN
120.0000 mg | Freq: Once | SUBCUTANEOUS | Status: AC
  Administered 2014-06-05: 120 mg via SUBCUTANEOUS
  Filled 2014-06-05: qty 1.7

## 2014-06-05 MED ORDER — GOSERELIN ACETATE 3.6 MG ~~LOC~~ IMPL
3.6000 mg | DRUG_IMPLANT | Freq: Once | SUBCUTANEOUS | Status: AC
Start: 2014-06-05 — End: 2014-06-05
  Administered 2014-06-05: 3.6 mg via SUBCUTANEOUS
  Filled 2014-06-05: qty 3.6

## 2014-06-18 ENCOUNTER — Other Ambulatory Visit: Payer: Self-pay | Admitting: *Deleted

## 2014-06-20 ENCOUNTER — Telehealth: Payer: Self-pay | Admitting: *Deleted

## 2014-06-20 ENCOUNTER — Other Ambulatory Visit: Payer: Self-pay | Admitting: *Deleted

## 2014-06-20 MED ORDER — PALBOCICLIB 125 MG PO CAPS: 125.0000 mg | ORAL_CAPSULE | ORAL | Status: DC

## 2014-06-20 NOTE — Telephone Encounter (Signed)
Received faxed request from [facility] for refill on [facility].  Given to Dr. Virgie Dad nurse - Marlon Pel RN

## 2014-06-28 ENCOUNTER — Other Ambulatory Visit (HOSPITAL_BASED_OUTPATIENT_CLINIC_OR_DEPARTMENT_OTHER): Payer: BLUE CROSS/BLUE SHIELD

## 2014-06-28 ENCOUNTER — Ambulatory Visit (HOSPITAL_BASED_OUTPATIENT_CLINIC_OR_DEPARTMENT_OTHER): Payer: BLUE CROSS/BLUE SHIELD

## 2014-06-28 VITALS — BP 131/68 | HR 74 | Temp 98.2°F

## 2014-06-28 DIAGNOSIS — C7951 Secondary malignant neoplasm of bone: Secondary | ICD-10-CM | POA: Diagnosis not present

## 2014-06-28 DIAGNOSIS — Z5111 Encounter for antineoplastic chemotherapy: Secondary | ICD-10-CM

## 2014-06-28 DIAGNOSIS — C50411 Malignant neoplasm of upper-outer quadrant of right female breast: Secondary | ICD-10-CM

## 2014-06-28 DIAGNOSIS — C50419 Malignant neoplasm of upper-outer quadrant of unspecified female breast: Secondary | ICD-10-CM

## 2014-06-28 LAB — COMPREHENSIVE METABOLIC PANEL (CC13)
ALT: 43 U/L (ref 0–55)
AST: 31 U/L (ref 5–34)
Albumin: 4.1 g/dL (ref 3.5–5.0)
Alkaline Phosphatase: 56 U/L (ref 40–150)
Anion Gap: 10 mEq/L (ref 3–11)
BUN: 13.2 mg/dL (ref 7.0–26.0)
CO2: 17 mEq/L — ABNORMAL LOW (ref 22–29)
Calcium: 9.3 mg/dL (ref 8.4–10.4)
Chloride: 111 mEq/L — ABNORMAL HIGH (ref 98–109)
Creatinine: 0.8 mg/dL (ref 0.6–1.1)
EGFR: 81 mL/min/{1.73_m2} — AB (ref >90)
GLUCOSE: 75 mg/dL (ref 70–140)
Potassium: 4 mEq/L (ref 3.5–5.1)
Sodium: 138 mEq/L (ref 136–145)
Total Bilirubin: 0.38 mg/dL (ref 0.20–1.20)
Total Protein: 7.3 g/dL (ref 6.4–8.3)

## 2014-06-28 LAB — CBC WITH DIFFERENTIAL/PLATELET
BASO%: 1.2 % (ref 0.0–2.0)
BASOS ABS: 0.1 10*3/uL (ref 0.0–0.1)
EOS ABS: 0 10*3/uL (ref 0.0–0.5)
EOS%: 0.5 % (ref 0.0–7.0)
HCT: 38.9 % (ref 34.8–46.6)
HEMOGLOBIN: 13.5 g/dL (ref 11.6–15.9)
LYMPH%: 52.5 % — AB (ref 14.0–49.7)
MCH: 35.9 pg — ABNORMAL HIGH (ref 25.1–34.0)
MCHC: 34.8 g/dL (ref 31.5–36.0)
MCV: 103 fL — AB (ref 79.5–101.0)
MONO#: 0.7 10*3/uL (ref 0.1–0.9)
MONO%: 15.5 % — ABNORMAL HIGH (ref 0.0–14.0)
NEUT#: 1.4 10*3/uL — ABNORMAL LOW (ref 1.5–6.5)
NEUT%: 30.3 % — AB (ref 38.4–76.8)
PLATELETS: 258 10*3/uL (ref 145–400)
RBC: 3.77 10*6/uL (ref 3.70–5.45)
RDW: 14.6 % — ABNORMAL HIGH (ref 11.2–14.5)
WBC: 4.7 10*3/uL (ref 3.9–10.3)
lymph#: 2.5 10*3/uL (ref 0.9–3.3)

## 2014-06-28 MED ORDER — DENOSUMAB 120 MG/1.7ML ~~LOC~~ SOLN
120.0000 mg | Freq: Once | SUBCUTANEOUS | Status: AC
  Administered 2014-06-28: 120 mg via SUBCUTANEOUS
  Filled 2014-06-28: qty 1.7

## 2014-06-28 MED ORDER — GOSERELIN ACETATE 3.6 MG ~~LOC~~ IMPL
3.6000 mg | DRUG_IMPLANT | Freq: Once | SUBCUTANEOUS | Status: AC
  Administered 2014-06-28: 3.6 mg via SUBCUTANEOUS
  Filled 2014-06-28: qty 3.6

## 2014-07-26 ENCOUNTER — Other Ambulatory Visit (HOSPITAL_BASED_OUTPATIENT_CLINIC_OR_DEPARTMENT_OTHER): Payer: BLUE CROSS/BLUE SHIELD

## 2014-07-26 ENCOUNTER — Ambulatory Visit (HOSPITAL_BASED_OUTPATIENT_CLINIC_OR_DEPARTMENT_OTHER): Payer: BLUE CROSS/BLUE SHIELD

## 2014-07-26 VITALS — BP 132/63 | HR 70 | Temp 97.9°F

## 2014-07-26 DIAGNOSIS — C50411 Malignant neoplasm of upper-outer quadrant of right female breast: Secondary | ICD-10-CM

## 2014-07-26 DIAGNOSIS — Z5111 Encounter for antineoplastic chemotherapy: Secondary | ICD-10-CM | POA: Diagnosis not present

## 2014-07-26 DIAGNOSIS — C7951 Secondary malignant neoplasm of bone: Secondary | ICD-10-CM

## 2014-07-26 DIAGNOSIS — C50419 Malignant neoplasm of upper-outer quadrant of unspecified female breast: Secondary | ICD-10-CM

## 2014-07-26 LAB — COMPREHENSIVE METABOLIC PANEL (CC13)
ALBUMIN: 4 g/dL (ref 3.5–5.0)
ALK PHOS: 57 U/L (ref 40–150)
ALT: 38 U/L (ref 0–55)
AST: 24 U/L (ref 5–34)
Anion Gap: 10 mEq/L (ref 3–11)
BILIRUBIN TOTAL: 0.43 mg/dL (ref 0.20–1.20)
BUN: 13.8 mg/dL (ref 7.0–26.0)
CALCIUM: 9.6 mg/dL (ref 8.4–10.4)
CHLORIDE: 107 meq/L (ref 98–109)
CO2: 22 meq/L (ref 22–29)
Creatinine: 0.9 mg/dL (ref 0.6–1.1)
EGFR: 77 mL/min/{1.73_m2} — AB (ref >90)
Glucose: 102 mg/dl (ref 70–140)
POTASSIUM: 4.3 meq/L (ref 3.5–5.1)
Sodium: 139 mEq/L (ref 136–145)
Total Protein: 6.8 g/dL (ref 6.4–8.3)

## 2014-07-26 LAB — CBC WITH DIFFERENTIAL/PLATELET
BASO%: 0.9 % (ref 0.0–2.0)
Basophils Absolute: 0 10*3/uL (ref 0.0–0.1)
EOS ABS: 0 10*3/uL (ref 0.0–0.5)
EOS%: 0.3 % (ref 0.0–7.0)
HCT: 36.9 % (ref 34.8–46.6)
HGB: 13 g/dL (ref 11.6–15.9)
LYMPH%: 46 % (ref 14.0–49.7)
MCH: 35.9 pg — ABNORMAL HIGH (ref 25.1–34.0)
MCHC: 35.2 g/dL (ref 31.5–36.0)
MCV: 101.9 fL — ABNORMAL HIGH (ref 79.5–101.0)
MONO#: 0.4 10*3/uL (ref 0.1–0.9)
MONO%: 11.2 % (ref 0.0–14.0)
NEUT%: 41.6 % (ref 38.4–76.8)
NEUTROS ABS: 1.3 10*3/uL — AB (ref 1.5–6.5)
PLATELETS: 206 10*3/uL (ref 145–400)
RBC: 3.62 10*6/uL — ABNORMAL LOW (ref 3.70–5.45)
RDW: 14.5 % (ref 11.2–14.5)
WBC: 3.2 10*3/uL — ABNORMAL LOW (ref 3.9–10.3)
lymph#: 1.5 10*3/uL (ref 0.9–3.3)

## 2014-07-26 MED ORDER — DENOSUMAB 120 MG/1.7ML ~~LOC~~ SOLN
120.0000 mg | Freq: Once | SUBCUTANEOUS | Status: AC
  Administered 2014-07-26: 120 mg via SUBCUTANEOUS
  Filled 2014-07-26: qty 1.7

## 2014-07-26 MED ORDER — GOSERELIN ACETATE 3.6 MG ~~LOC~~ IMPL
3.6000 mg | DRUG_IMPLANT | Freq: Once | SUBCUTANEOUS | Status: AC
  Administered 2014-07-26: 3.6 mg via SUBCUTANEOUS
  Filled 2014-07-26: qty 3.6

## 2014-08-23 ENCOUNTER — Other Ambulatory Visit: Payer: BLUE CROSS/BLUE SHIELD

## 2014-08-23 ENCOUNTER — Encounter: Payer: Self-pay | Admitting: Nurse Practitioner

## 2014-08-23 ENCOUNTER — Telehealth: Payer: Self-pay | Admitting: Oncology

## 2014-08-23 ENCOUNTER — Other Ambulatory Visit: Payer: Self-pay | Admitting: *Deleted

## 2014-08-23 ENCOUNTER — Ambulatory Visit (HOSPITAL_BASED_OUTPATIENT_CLINIC_OR_DEPARTMENT_OTHER): Payer: BLUE CROSS/BLUE SHIELD

## 2014-08-23 ENCOUNTER — Other Ambulatory Visit (HOSPITAL_BASED_OUTPATIENT_CLINIC_OR_DEPARTMENT_OTHER): Payer: BLUE CROSS/BLUE SHIELD

## 2014-08-23 ENCOUNTER — Ambulatory Visit (HOSPITAL_BASED_OUTPATIENT_CLINIC_OR_DEPARTMENT_OTHER): Payer: BLUE CROSS/BLUE SHIELD | Admitting: Nurse Practitioner

## 2014-08-23 VITALS — BP 128/68 | HR 69 | Temp 98.2°F | Resp 18 | Ht 63.0 in | Wt 235.7 lb

## 2014-08-23 DIAGNOSIS — C50411 Malignant neoplasm of upper-outer quadrant of right female breast: Secondary | ICD-10-CM | POA: Diagnosis not present

## 2014-08-23 DIAGNOSIS — Z5111 Encounter for antineoplastic chemotherapy: Secondary | ICD-10-CM

## 2014-08-23 DIAGNOSIS — C7951 Secondary malignant neoplasm of bone: Secondary | ICD-10-CM

## 2014-08-23 DIAGNOSIS — C50419 Malignant neoplasm of upper-outer quadrant of unspecified female breast: Secondary | ICD-10-CM

## 2014-08-23 LAB — CBC WITH DIFFERENTIAL/PLATELET
BASO%: 1.1 % (ref 0.0–2.0)
Basophils Absolute: 0 10*3/uL (ref 0.0–0.1)
EOS ABS: 0 10*3/uL (ref 0.0–0.5)
EOS%: 0.5 % (ref 0.0–7.0)
HCT: 37.9 % (ref 34.8–46.6)
HGB: 13.2 g/dL (ref 11.6–15.9)
LYMPH#: 1.5 10*3/uL (ref 0.9–3.3)
LYMPH%: 46.3 % (ref 14.0–49.7)
MCH: 36.5 pg — ABNORMAL HIGH (ref 25.1–34.0)
MCHC: 34.8 g/dL (ref 31.5–36.0)
MCV: 104.9 fL — AB (ref 79.5–101.0)
MONO#: 0.4 10*3/uL (ref 0.1–0.9)
MONO%: 13.1 % (ref 0.0–14.0)
NEUT#: 1.3 10*3/uL — ABNORMAL LOW (ref 1.5–6.5)
NEUT%: 39 % (ref 38.4–76.8)
Platelets: 247 10*3/uL (ref 145–400)
RBC: 3.62 10*6/uL — ABNORMAL LOW (ref 3.70–5.45)
RDW: 14.7 % — AB (ref 11.2–14.5)
WBC: 3.2 10*3/uL — AB (ref 3.9–10.3)

## 2014-08-23 LAB — COMPREHENSIVE METABOLIC PANEL (CC13)
ALBUMIN: 3.9 g/dL (ref 3.5–5.0)
ALK PHOS: 54 U/L (ref 40–150)
ALT: 28 U/L (ref 0–55)
AST: 20 U/L (ref 5–34)
Anion Gap: 8 mEq/L (ref 3–11)
BUN: 16.5 mg/dL (ref 7.0–26.0)
CHLORIDE: 110 meq/L — AB (ref 98–109)
CO2: 21 mEq/L — ABNORMAL LOW (ref 22–29)
CREATININE: 0.9 mg/dL (ref 0.6–1.1)
Calcium: 9.4 mg/dL (ref 8.4–10.4)
EGFR: 76 mL/min/{1.73_m2} — ABNORMAL LOW (ref >90)
Glucose: 112 mg/dl (ref 70–140)
Potassium: 4 mEq/L (ref 3.5–5.1)
SODIUM: 139 meq/L (ref 136–145)
Total Bilirubin: 0.3 mg/dL (ref 0.20–1.20)
Total Protein: 6.8 g/dL (ref 6.4–8.3)

## 2014-08-23 MED ORDER — DENOSUMAB 120 MG/1.7ML ~~LOC~~ SOLN
120.0000 mg | Freq: Once | SUBCUTANEOUS | Status: AC
  Administered 2014-08-23: 120 mg via SUBCUTANEOUS
  Filled 2014-08-23: qty 1.7

## 2014-08-23 MED ORDER — GABAPENTIN 100 MG PO CAPS: 100.0000 mg | ORAL_CAPSULE | Freq: Every day | ORAL | Status: DC

## 2014-08-23 MED ORDER — GOSERELIN ACETATE 3.6 MG ~~LOC~~ IMPL
3.6000 mg | DRUG_IMPLANT | Freq: Once | SUBCUTANEOUS | Status: AC
  Administered 2014-08-23: 3.6 mg via SUBCUTANEOUS
  Filled 2014-08-23: qty 3.6
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
## 2014-08-23 NOTE — Telephone Encounter (Signed)
Appointments in place for November and pet order noted,central scheduling will call the patient

## 2014-08-24 ENCOUNTER — Telehealth: Payer: Self-pay | Admitting: Obstetrics and Gynecology

## 2014-08-24 NOTE — Telephone Encounter (Signed)
Spoke with patient. Patient would like to consider having both ovaries removed after discussion with Dr.Magrinat. Advised will need to be seen for consultation appointment with Dr.Silva and then based on recommendations from [facility] can proceed from there. Patient is agreeable. Appointment scheduled for 08/31/2014 at [facility] with Dr.Silva. Patient is agreeable to date and time.  Routing to provider for final review. Patient agreeable to disposition. Will close encounter.   Patient aware provider will review message and nurse will return call if any additional advice or change of disposition.

## 2014-08-24 NOTE — Telephone Encounter (Signed)
Patient has breast cancer and her oncologist Dr Jana Hakim suggested she have both ovaries removed. Patient is unsure of her next step and would like to speak with Dr Quincy Simmonds or her nurse.

## 2014-08-31 ENCOUNTER — Ambulatory Visit: Payer: BLUE CROSS/BLUE SHIELD | Admitting: Obstetrics and Gynecology

## 2014-08-31 ENCOUNTER — Telehealth: Payer: Self-pay | Admitting: Obstetrics and Gynecology

## 2014-08-31 NOTE — Telephone Encounter (Signed)
Thank you for the update.  I hope she is well.  She is in process of cancer treatment.  No DNKA fee.

## 2014-08-31 NOTE — Telephone Encounter (Signed)
Patient dnka her appointment for "surgery consult, would like ovaries removed" today. I left patient a message to call when ready to reschedule.

## 2014-09-03 ENCOUNTER — Encounter: Payer: Self-pay | Admitting: Obstetrics and Gynecology

## 2014-09-03 ENCOUNTER — Ambulatory Visit (INDEPENDENT_AMBULATORY_CARE_PROVIDER_SITE_OTHER): Payer: BLUE CROSS/BLUE SHIELD | Admitting: Obstetrics and Gynecology

## 2014-09-03 VITALS — BP 110/70 | HR 80 | Resp 16 | Wt 236.0 lb

## 2014-09-03 DIAGNOSIS — C50911 Malignant neoplasm of unspecified site of right female breast: Secondary | ICD-10-CM

## 2014-09-03 NOTE — Patient Instructions (Signed)
Bilateral Salpingo-Oophorectomy Bilateral salpingo-oophorectomy is the surgical removal of both fallopian tubes and both ovaries. The ovaries are small organs that produce eggs in women. The fallopian tubes transport the egg from the ovary to the womb (uterus). Usually, when this surgery is done, the uterus was previously removed. A bilateral salpingo-oophorectomy may be done to treat cancer or to reduce the risk of cancer in women who are at high risk. Removing both fallopian tubes and both ovaries will make you unable to become pregnant (sterile). It will also put you into menopause so that you will no longer have menstrual periods and may have menopausal symptoms such as hot flashes, night sweats, and mood changes. It will not affect your sex drive. LET [facility] CARE PROVIDER KNOW ABOUT:  Any allergies you have.  All medicines you are taking, including vitamins, herbs, eye drops, creams, and over-the-counter medicines.  Previous problems you or members of your family have had with the use of anesthetics.  Any blood disorders you have.  Previous surgeries you have had.  Medical conditions you have. RISKS AND COMPLICATIONS Generally, this is a safe procedure. However, as with any procedure, complications can occur. Possible complications include:  Injury to surrounding organs.  Bleeding.  Infection.  Blood clots in the legs or lungs.  Problems related to anesthesia. BEFORE THE PROCEDURE  Ask your health care provider about changing or stopping your regular medicines. You may need to stop taking certain medicines, such as aspirin or blood thinners, at least 1 week before the surgery.  Do not eat or drink anything for at least 8 hours before the surgery.  If you smoke, do not smoke for at least 2 weeks before the surgery.  Make plans to have someone drive you home after the procedure or after your hospital stay. Also arrange for someone to help you with activities during  recovery. PROCEDURE   You will be given medicine to help you relax before the procedure (sedative). You will then be given medicine to make you sleep through the procedure (general anesthetic). These medicines will be given through an IV access tube that is put into one of your veins.  Once you are asleep, your lower abdomen will be shaved and cleaned. A thin, flexible tube (catheter) will be placed in your bladder.  The surgeon may use a laparoscopic, robotic, or open technique for this surgery:  In the laparoscopic technique, the surgery is done through two small cuts (incisions) in the abdomen. A thin, lighted tube with a tiny camera on the end (laparoscope) is inserted into one of the incisions. The tools needed for the procedure are put through the other incision.  A robotic technique may be chosen to perform complex surgery in a small space. In the robotic technique, small incisions will be made. A camera and surgical instruments are passed through the incisions. Surgical instruments will be controlled with the help of a robotic arm.  In the open technique, the surgery is done through one large incision in the abdomen.  Using any of these techniques, the surgeon removes the fallopian tubes and ovaries. The blood vessels will be clamped and tied.  The surgeon then uses staples or stitches to close the incision or incisions. AFTER THE PROCEDURE  You will be taken to a recovery area where you will be monitored for 1 to 3 hours. Your blood pressure, pulse, and temperature will be checked often. You will remain in the recovery area until you are stable and waking  up.  If the laparoscopic technique was used, you may be allowed to go home after several hours. You may have some shoulder pain after the laparoscopic procedure. This is normal and usually goes away in a day or two.  If the open technique was used, you will be admitted to the hospital for a couple of days.  You will be given pain  medicine as needed.  The IV access tube and catheter will be removed before you are discharged. Document Released: 01/12/2005 Document Revised: 01/17/2013 Document Reviewed: 07/06/2012 [facility] Patient Information 2015 [place], [place]. This information is not intended to replace advice given to you by your health care provider. Make sure you discuss any questions you have with your health care provider.   Diagnostic Laparoscopy Laparoscopy is a surgical procedure. It is used to diagnose and treat diseases inside the belly (abdomen). It is usually a brief, common, and relatively simple procedure. The laparoscopeis a thin, lighted, pencil-sized instrument. It is like a telescope. It is inserted into your abdomen through a small cut (incision). Your caregiver can look at the organs inside your body through this instrument. He or she can see if there is anything abnormal. Laparoscopy can be done either in a hospital or outpatient clinic. You may be given a mild sedative to help you relax before the procedure. Once in the operating room, you will be given a drug to make you sleep (general anesthesia). Laparoscopy usually lasts less than 1 hour. After the procedure, you will be monitored in a recovery area until you are stable and doing well. Once you are home, it will take 2 to 3 days to fully recover. RISKS AND COMPLICATIONS  Laparoscopy has relatively few risks. Your caregiver will discuss the risks with you before the procedure. Some problems that can occur include:  Infection.  Bleeding.  Damage to other organs.  Anesthetic side effects. PROCEDURE Once you receive anesthesia, your surgeon inflates the abdomen with a harmless gas (carbon dioxide). This makes the organs easier to see. The laparoscope is inserted into the abdomen through a small incision. This allows your surgeon to see into the abdomen. Other small instruments are also inserted into the abdomen through other small openings. Many  surgeons attach a video camera to the laparoscope to enlarge the view. During a diagnostic laparoscopy, the surgeon may be looking for inflammation, infection, or cancer. Your surgeon may take tissue samples(biopsies). The samples are sent to a specialist in looking at cells and tissue samples (pathologist). The pathologist examines them under a microscope. Biopsies can help to diagnose or confirm a disease. AFTER THE PROCEDURE   The gas is released from inside the abdomen.  The incisions are closed with stitches (sutures). Because these incisions are small (usually less than 1/2 inch), there is usually minimal discomfort after the procedure. There may be some mild discomfort in the throat. This is from the tube placed in the throat while you were sleeping. You may have some mild abdominal discomfort. There may also be discomfort from the instrument placement incisions in the abdomen.  The recovery time is shortened as long as there are no complications.  You will rest in a recovery room until stable and doing well. As long as there are no complications, you may be allowed to go home. FINDING OUT THE RESULTS OF YOUR TEST Not all test results are available during your visit. If your test results are not back during the visit, make an appointment with your caregiver to find  out the results. Do not assume everything is normal if you have not heard from your caregiver or the medical facility. It is important for you to follow up on all of your test results. HOME CARE INSTRUCTIONS   Take all medicines as directed.  Only take over-the-counter or prescription medicines for pain, discomfort, or fever as directed by your caregiver.  Resume daily activities as directed.  Showers are preferred over baths.  You may resume sexual activities in 1 week or as directed.  Do not drive while taking narcotics. SEEK MEDICAL CARE IF:   There is increasing abdominal pain.  There is new pain in the shoulders  (shoulder strap areas).  You feel lightheaded or faint.  You have the chills.  You or your child has an oral temperature above 102 F (38.9 C).  There is pus-like (purulent) drainage from any of the wounds.  You are unable to pass gas or have a bowel movement.  You feel sick to your stomach (nauseous) or throw up (vomit). MAKE SURE YOU:   Understand these instructions.  Will watch your condition.  Will get help right away if you are not doing well or get worse. Document Released: 04/20/2000 Document Revised: 05/09/2012 Document Reviewed: 01/12/2007 [facility] Patient Information 2015 [place], [place]. This information is not intended to replace advice given to you by your health care provider. Make sure you discuss any questions you have with your health care provider.

## 2014-09-03 NOTE — Progress Notes (Signed)
Patient ID: Cheryl Moore, female   DOB: Jun 24, 1960, 54 y.o.   MRN: [id number] GYNECOLOGY  VISIT   HPI: 54 y.o.   Married  Caucasian  female   G1P1001 with No LMP recorded. Patient is not currently having periods (Reason: Chemotherapy).   here to discuss having her ovaries removed  Has metastatic breast cancer and wants to come off Zoladex.  Dr. Jana Hakim, patient's oncologist, has discussed bilateral oophorectomy with patient.  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  PET scan in May 2016 - negative.  CT of abdomen and pelvis on 06/27/13 showed normal ovaries and uterus.   Taking Ibrance for a year now ans notes rash when she stops this.   GYNECOLOGIC HISTORY: No LMP recorded. Patient is not currently having periods (Reason: Chemotherapy). Contraception:N/A Menopausal hormone therapy: N/A Last mammogram: mastectomy 2014 Last pap smear: 05-02-12 WNL         OB History    Gravida Para Term Preterm AB TAB SAB Ectopic Multiple Living   1 1 1       1          Patient Active Problem List   Diagnosis Date Noted  . Nausea without vomiting 03/09/2014  . Breast cancer of upper-outer quadrant of right female breast 10/06/2013  . History of colonic polyps 09/19/2013  . Dyspnea 09/21/2012  . Obesity 05/02/2012    Past Medical History  Diagnosis Date  . Breast cancer     right  . Anxiety   . Hot flashes   . History of radiation therapy 11/28/12-03/06/13    right breast/64.4Gy/ right hip=37.5Gy   . Obesity   . Personal history of colonic polyps - ssp and adenomas 09/19/2013    Past Surgical History  Procedure Laterality Date  . Cesarean section  2005  . Breast surgery Right     breast bx  . Breast biopsy Right 05/23/2012    Procedure: SKIN PUNCH BIOPSY RIGHT BREAST;  Surgeon: [name], MD;  Location: [place];  Service: General;  Laterality: Right;  . Portacath placement Left 05/23/2012    Procedure: INSERTION PORT-A-CATH;  Surgeon:  [name], MD;  Location: [place];  Service: General;  Laterality: Left;  . Total mastectomy Left 10/26/2012    Dr Donne Hazel  . Simple mastectomy with axillary sentinel node biopsy Left 10/26/2012    Procedure: TOTAL MASTECTOMY;  Surgeon: [name], MD;  Location: [place];  Service: General;  Laterality: Left;  Marland Kitchen Mastectomy modified radical Right 10/26/2012    Procedure: MASTECTOMY MODIFIED RADICAL;  Surgeon: [name], MD;  Location: [place];  Service: General;  Laterality: Right;  . Radiology with anesthesia N/A 08/24/2013    Procedure: MRI;  Surgeon: Medication Radiologist, MD;  Location: [place];  Service: Radiology;  Laterality: N/A;  . Port-a-cath removal Left 04/10/2014    Procedure: REMOVAL PORT-A-CATH;  Surgeon: [name], MD;  Location: [place];  Service: General;  Laterality: Left;    Current Outpatient Prescriptions  Medication Sig Dispense Refill  . ALPRAZolam (XANAX) 0.25 MG tablet Take 1 tablet (0.25 mg total) by mouth at bedtime as needed for anxiety or sleep. 30 tablet 3  . Ascorbic Acid (VITAMIN C PO) Take 1 tablet by mouth daily.     Marland Kitchen CALCIUM PO Take 1 tablet by mouth daily.    Marland Kitchen denosumab (XGEVA) 120 MG/1.7ML SOLN injection Inject 120 mg into the skin every 30 (thirty) days.    Marland Kitchen gabapentin (NEURONTIN) 100 MG capsule  Take 1 capsule (100 mg total) by mouth daily. 90 capsule 2  . gabapentin (NEURONTIN) 300 MG capsule Take 1 capsule (300 mg total) by mouth at bedtime. 90 capsule 4  . goserelin (ZOLADEX) 10.8 MG injection Inject 10.8 mg into the skin every 30 (thirty) days.     Marland Kitchen ibuprofen (ADVIL,MOTRIN) 200 MG tablet Take 200 mg by mouth every 8 (eight) hours as needed for pain.     Marland Kitchen letrozole (FEMARA) 2.5 MG tablet TAKE 1 TABLET BY MOUTH DAILY 30 tablet 2  . Multiple Vitamin (MULTIVITAMIN) tablet Take 1 tablet by mouth daily.    . palbociclib (IBRANCE) 125 MG capsule Take 1 capsule (125 mg total) by mouth See admin instructions. Takes whole with food.   Takes one tablets for 21 days, stops for 7 days, then repeats. 21 capsule 3  . prochlorperazine (COMPAZINE) 10 MG tablet TAKE 1 TABLET (10 MG TOTAL) BY MOUTH EVERY 6 (SIX) HOURS AS NEEDED FOR NAUSEA OR VOMITING. 30 tablet 2  . psyllium (METAMUCIL) 58.6 % powder Take 1 packet by mouth daily.    . traMADol (ULTRAM) 50 MG tablet Take 1 tablet (50 mg total) by mouth every 6 (six) hours as needed for moderate pain. (Patient taking differently: Take 50 mg by mouth at bedtime. ) 30 tablet 3  . venlafaxine (EFFEXOR) 75 MG tablet TAKE ONE TABLET BY MOUTH ONCE DAILY 30 tablet 6   No current facility-administered medications for this visit.     ALLERGIES: Other and Penicillins  Family History  Problem Relation Age of Onset  . Adopted: Yes    History   Social History  . Marital Status: Married    Spouse Name: N/A  . Number of Children: 1  . Years of Education: N/A   Occupational History  . Housewife    Social History Main Topics  . Smoking status: Former Smoker -- 0.25 packs/day for 5 years    Types: Cigarettes    Quit date: 05/21/2002  . Smokeless tobacco: Never Used  . Alcohol Use: No  . Drug Use: No  . Sexual Activity:    Partners: Male    Birth Control/ Protection: None   Other Topics Concern  . 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  PHYSICAL EXAMINATION:    BP 110/70 mmHg  Pulse 80  Resp 16  Wt 236 lb (107.049 kg)    General appearance: alert, cooperative and appears stated age Abdomen: obese, soft, non-tender; bowel sounds normal; no masses,  no organomegaly   Pelvic: External genitalia:  no lesions              Urethra:  normal appearing urethra with no masses, tenderness or lesions              Bartholins and Skenes: normal                 Vagina: normal appearing vagina with normal color and discharge, no lesions              Cervix: no lesions                 Bimanual Exam:  Uterus:  normal size, contour, position, consistency, mobility, non-tender              Adnexa: normal adnexa and no mass, fullness, tenderness  Chaperone was present for exam.  ASSESSMENT  Stage IV estrogen receptor positive breast cancer.  Status post bilateral mastectomies.  Negative PET scan May 2016.  On  Zoladex.  Desire for ovarian removal.  Hx of Cesarean Section.  Obesity. Adopted.  PLAN  Counseled regarding laparoscopic bilateral salpingo-oophorectomy.  I have discussed risks and benefits of salpingo-oophorectomy.  Risks include but are not limited to bleeding, infection, damage to surrounding organs, reaction to anesthesia, pneumonia, DVT, PE, death, hernia formation, and need for reoperation.  An After Visit Summary was printed and given to the patient.  __15____ minutes face to face time of which over 50% was spent in counseling.   Addendum after visit completed - Will have patient proceed with pelvic ultrasound prior to surgery.

## 2014-09-04 ENCOUNTER — Telehealth: Payer: Self-pay | Admitting: *Deleted

## 2014-09-04 ENCOUNTER — Ambulatory Visit (HOSPITAL_BASED_OUTPATIENT_CLINIC_OR_DEPARTMENT_OTHER): Payer: BLUE CROSS/BLUE SHIELD | Admitting: Nurse Practitioner

## 2014-09-04 ENCOUNTER — Ambulatory Visit: Payer: BLUE CROSS/BLUE SHIELD

## 2014-09-04 ENCOUNTER — Other Ambulatory Visit: Payer: Self-pay | Admitting: *Deleted

## 2014-09-04 VITALS — BP 120/97 | HR 80 | Temp 97.4°F | Resp 16 | Wt 238.6 lb

## 2014-09-04 DIAGNOSIS — C50411 Malignant neoplasm of upper-outer quadrant of right female breast: Secondary | ICD-10-CM | POA: Diagnosis not present

## 2014-09-04 DIAGNOSIS — B029 Zoster without complications: Secondary | ICD-10-CM | POA: Diagnosis not present

## 2014-09-04 DIAGNOSIS — L710 Perioral dermatitis: Secondary | ICD-10-CM | POA: Diagnosis not present

## 2014-09-04 MED ORDER — VALACYCLOVIR HCL 1 G PO TABS: 1000.0000 mg | ORAL_TABLET | Freq: Three times a day (TID) | ORAL | Status: DC

## 2014-09-04 NOTE — Telephone Encounter (Signed)
VM message received from patient @ 1040 am regarding a rash she has developed while being on IBrance.  She has been on IBrance for 15 mos but developed a rash last cycle and it went away on her week off IBrance. She has started another cycle and the rash has returned but more severe this time. She states she has a cyst on her back the size of her fist.  Attempted call back to patient to have her seen in [facility] today. Left message on identified phone # to have pt call back as soon as possible.

## 2014-09-04 NOTE — Telephone Encounter (Signed)
POF sent to scheduler 

## 2014-09-05 ENCOUNTER — Telehealth: Payer: Self-pay | Admitting: Emergency Medicine

## 2014-09-05 ENCOUNTER — Encounter: Payer: Self-pay | Admitting: Nurse Practitioner

## 2014-09-05 ENCOUNTER — Telehealth: Payer: Self-pay

## 2014-09-05 DIAGNOSIS — L710 Perioral dermatitis: Secondary | ICD-10-CM | POA: Insufficient documentation

## 2014-09-05 DIAGNOSIS — B029 Zoster without complications: Secondary | ICD-10-CM | POA: Insufficient documentation

## 2014-09-05 NOTE — Progress Notes (Signed)
SYMPTOM MANAGEMENT CLINIC   HPI: Cheryl Moore 54 y.o. female diagnosed with breast cancer.  Patient is status post bilateral mastectomy, chemotherapy, and radiation treatments.  She is currently undergoing Ibrance oral therapy, Xgeva, and Zoladex injections.    Patient states that she met with her oncology gynecologist just yesterday to discuss the removal of her ovaries.  She is anxious to undergo this surgery; so she can discontinue the Zoladex injections.  Patient is complaining of a rash to her perioral region; and has also noticed a different type rash to her right upper back since this past weekend.   HPI  ROS  Past Medical History  Diagnosis Date  . Breast cancer     right  . Anxiety   . Hot flashes   . History of radiation therapy 11/28/12-03/06/13    right breast/64.4Gy/ right hip=37.5Gy   . Obesity   . Personal history of colonic polyps - ssp and adenomas 09/19/2013    Past Surgical History  Procedure Laterality Date  . Cesarean section  2005  . Breast surgery Right     breast bx  . Breast biopsy Right 05/23/2012    Procedure: SKIN PUNCH BIOPSY RIGHT BREAST;  Surgeon: [name], MD;  Location: [place];  Service: General;  Laterality: Right;  . Portacath placement Left 05/23/2012    Procedure: INSERTION PORT-A-CATH;  Surgeon: [name], MD;  Location: [place];  Service: General;  Laterality: Left;  . Total mastectomy Left 10/26/2012    Dr Donne Hazel  . Simple mastectomy with axillary sentinel node biopsy Left 10/26/2012    Procedure: TOTAL MASTECTOMY;  Surgeon: [name], MD;  Location: [place];  Service: General;  Laterality: Left;  Marland Kitchen Mastectomy modified radical Right 10/26/2012    Procedure: MASTECTOMY MODIFIED RADICAL;  Surgeon: [name], MD;  Location: [place];  Service: General;  Laterality: Right;  . Radiology with anesthesia N/A 08/24/2013    Procedure: MRI;  Surgeon: Medication Radiologist, MD;  Location: [place];  Service: Radiology;   Laterality: N/A;  . Port-a-cath removal Left 04/10/2014    Procedure: REMOVAL PORT-A-CATH;  Surgeon: [name], MD;  Location: [place];  Service: General;  Laterality: Left;    has Obesity; Dyspnea; History of colonic polyps; Breast cancer of upper-outer quadrant of right female breast; Nausea without vomiting; Perioral dermatitis; and Shingles on her problem list.    is allergic to other and penicillins.    Medication List       This list is accurate as of: 09/04/14 11:59 PM.  Always use your most recent med list.               ALPRAZolam 0.25 MG tablet  Commonly known as:  XANAX  Take 1 tablet (0.25 mg total) by mouth at bedtime as needed for anxiety or sleep.     CALCIUM PO  Take 1 tablet by mouth daily.     gabapentin 300 MG capsule  Commonly known as:  NEURONTIN  Take 1 capsule (300 mg total) by mouth at bedtime.     gabapentin 100 MG capsule  Commonly known as:  NEURONTIN  Take 1 capsule (100 mg total) by mouth daily.     ibuprofen 200 MG tablet  Commonly known as:  ADVIL,MOTRIN  Take 200 mg by mouth every 8 (eight) hours as needed for pain.     letrozole 2.5 MG tablet  Commonly known as:  FEMARA  TAKE 1 TABLET BY MOUTH DAILY     multivitamin  tablet  Take 1 tablet by mouth daily.     palbociclib 125 MG capsule  Commonly known as:  IBRANCE  Take 1 capsule (125 mg total) by mouth See admin instructions. Takes whole with food.  Takes one tablets for 21 days, stops for 7 days, then repeats.     prochlorperazine 10 MG tablet  Commonly known as:  COMPAZINE  TAKE 1 TABLET (10 MG TOTAL) BY MOUTH EVERY 6 (SIX) HOURS AS NEEDED FOR NAUSEA OR VOMITING.     psyllium 58.6 % powder  Commonly known as:  METAMUCIL  Take 1 packet by mouth daily.     traMADol 50 MG tablet  Commonly known as:  ULTRAM  Take 1 tablet (50 mg total) by mouth every 6 (six) hours as needed for moderate pain.     valACYclovir 1000 MG tablet  Commonly known as:  VALTREX  Take 1 tablet  (1,000 mg total) by mouth 3 (three) times daily.     venlafaxine 75 MG tablet  Commonly known as:  EFFEXOR  TAKE ONE TABLET BY MOUTH ONCE DAILY     VITAMIN C PO  Take 1 tablet by mouth daily.     XGEVA 120 MG/1.7ML Soln injection  Generic drug:  denosumab  Inject 120 mg into the skin every 30 (thirty) days.     ZOLADEX 10.8 MG injection  Generic drug:  goserelin  Inject 10.8 mg into the skin every 30 (thirty) days.         PHYSICAL EXAMINATION  Oncology Vitals 09/04/2014 09/03/2014 08/23/2014 07/26/2014 06/28/2014 06/05/2014 05/31/2014  Height - - 160 cm - - - 160 cm  Weight 108.228 kg 107.049 kg 106.913 kg - - - 107.321 kg  Weight (lbs) 238 lbs 10 oz 236 lbs 235 lbs 11 oz - - - 236 lbs 10 oz  BMI (kg/m2) - - 41.75 kg/m2 - - - 41.91 kg/m2  Temp 97.4 - 98.2 97.9 98.2 97.7 98.2  Pulse 80 80 69 70 74 67 79  Resp 16 16 18  - - - 18  SpO2 99 - - - - - -  BSA (m2) - - 2.18 m2 - - - 2.18 m2   BP Readings from Last 3 Encounters:  09/04/14 120/97  09/03/14 110/70  08/23/14 128/68    Physical Exam  Constitutional: She is oriented to person, place, and time and well-developed, well-nourished, and in no distress.  HENT:  Head: Normocephalic and atraumatic.  Mouth/Throat: Oropharynx is clear and moist. No oropharyngeal exudate.  See skin regarding rash to perioral region.  Eyes: Conjunctivae and EOM are normal. Pupils are equal, round, and reactive to light. Right eye exhibits no discharge. Left eye exhibits no discharge. No scleral icterus.  Neck: Normal range of motion. Neck supple. No JVD present. No tracheal deviation present. No thyromegaly present.  Pulmonary/Chest: Effort normal. No respiratory distress.  Musculoskeletal: Normal range of motion. She exhibits no edema or tenderness.  Lymphadenopathy:    She has no cervical adenopathy.  Neurological: She is alert and oriented to person, place, and time. Gait normal.  Skin: Skin is warm and dry. Rash noted. There is erythema. No  pallor.  Patient has a raised, red/inflamed rash to her right upper back.  There is no surrounding erythema, edema, warmth, or tenderness.  There is also no red streaks surrounded the rash.  Patient also has some fine, scattered pink/red lesions surrounded her perioral region; in some mild rash up towards her nares.  Psychiatric: Affect normal.  Nursing note and vitals reviewed.   LABORATORY DATA:. No visits with results within 3 Day(s) from this visit. Latest known visit with results is:  Appointment on 08/23/2014  Component Date Value Ref Range Status  . WBC 08/23/2014 3.2* 3.9 - 10.3 10e3/uL Final  . NEUT# 08/23/2014 1.3* 1.5 - 6.5 10e3/uL Final  . HGB 08/23/2014 13.2  11.6 - 15.9 g/dL Final  . HCT 08/23/2014 37.9  34.8 - 46.6 % Final  . Platelets 08/23/2014 247  145 - 400 10e3/uL Final  . MCV 08/23/2014 104.9* 79.5 - 101.0 fL Final  . MCH 08/23/2014 36.5* 25.1 - 34.0 pg Final  . MCHC 08/23/2014 34.8  31.5 - 36.0 g/dL Final  . RBC 08/23/2014 3.62* 3.70 - 5.45 10e6/uL Final  . RDW 08/23/2014 14.7* 11.2 - 14.5 % Final  . lymph# 08/23/2014 1.5  0.9 - 3.3 10e3/uL Final  . MONO# 08/23/2014 0.4  0.1 - 0.9 10e3/uL Final  . Eosinophils Absolute 08/23/2014 0.0  0.0 - 0.5 10e3/uL Final  . Basophils Absolute 08/23/2014 0.0  0.0 - 0.1 10e3/uL Final  . NEUT% 08/23/2014 39.0  38.4 - 76.8 % Final  . LYMPH% 08/23/2014 46.3  14.0 - 49.7 % Final  . MONO% 08/23/2014 13.1  0.0 - 14.0 % Final  . EOS% 08/23/2014 0.5  0.0 - 7.0 % Final  . BASO% 08/23/2014 1.1  0.0 - 2.0 % Final  . Sodium 08/23/2014 139  136 - 145 mEq/L Final  . Potassium 08/23/2014 4.0  3.5 - 5.1 mEq/L Final  . Chloride 08/23/2014 110* 98 - 109 mEq/L Final  . CO2 08/23/2014 21* 22 - 29 mEq/L Final  . Glucose 08/23/2014 112  70 - 140 mg/dl Final  . BUN 08/23/2014 16.5  7.0 - 26.0 mg/dL Final  . Creatinine 08/23/2014 0.9  0.6 - 1.1 mg/dL Final  . Total Bilirubin 08/23/2014 0.30  0.20 - 1.20 mg/dL Final  . Alkaline Phosphatase  08/23/2014 54  40 - 150 U/L Final  . AST 08/23/2014 20  5 - 34 U/L Final  . ALT 08/23/2014 28  0 - 55 U/L Final  . Total Protein 08/23/2014 6.8  6.4 - 8.3 g/dL Final  . Albumin 08/23/2014 3.9  3.5 - 5.0 g/dL Final  . Calcium 08/23/2014 9.4  8.4 - 10.4 mg/dL Final  . Anion Gap 08/23/2014 8  3 - 11 mEq/L Final  . EGFR 08/23/2014 76* >90 ml/min/1.73 m2 Final   eGFR is calculated using the CKD-EPI Creatinine Equation (2009)   RADIOGRAPHIC STUDIES: No results found.   Right back:     Face:      ASSESSMENT/PLAN:    Breast cancer of upper-outer quadrant of right female breast Patient is status post bilateral mastectomy, chemotherapy, and radiation treatments.  She is currently undergoing Ibrance oral therapy, Xgeva, and Zoladex injections.    Patient states that she met with her oncology gynecologist just yesterday to discuss the removal of her ovaries.  She is anxious to undergo this surgery; so she can discontinue the Zoladex injections.  Patient is complaining of a rash to her perioral region; and has also noticed a different type rash to her right upper back since this past weekend.  It is quite possible that the rash to patient's back is shingles.  Patient was advised to hold her oral chemotherapy until herpetic culture results obtained.  Patient was given valacyclovir prescription to initiate today.  Patient is scheduled to return on 09/20/2014 for labs at her next Zoladex injection.  Perioral dermatitis Mild rash that initiated around her mouth; and is now extended to her bilateral nares.  She also had cracks to the corners of her mouth bilaterally as well.  She denies any oral lesions whatsoever.  On exam.-It does appear the patient has some scattered pink/red rash surrounding her mouth; and some tiny bumps at her nares as well.  There are no oral lesions.  With further questioning-patient remembered that she just switched her toothpaste approximate 3 weeks ago.  This would  coincide with the initiation of her rash.  Advised patient that her.  Oral rash could very well be.  Oral dermatitis secondary to sensitivity to the new toothpaste.  However, patient has also developed a rash to her right upper back which does appear to be shingles.  Patient was advised to hold the new toothpaste; and try a more bland/natural toothpaste to see if that helps.  Shingles Patient has developed a rash to her right upper back since this past weekend.  She describes the rash is slightly pruritic and very painful.  She has also developed some.  Oral rash within the past 3 weeks; that she states has coincided with initiation of a new toothpaste.  On exam.  It does appear that patient has a raised, red rash to her right upper back.  There is no surrounding erythema, edema, warmth, or red streaks.  Quite possible that this new rash to patient's upper back is actually shingles.  A herpetic viral culture was obtained of the rash; pending results.  For the time being.-Will prescribe patient valacyclovir 1 g 3 times per day for treatment of possible shingles.  Also advised patient that the.  Oral rash.  She developed several weeks ago is most likely not related to her shingles outbreak; but instead related to her new toothpaste.  Patient states she is in the midst of her second week of Ibrance oral chemotherapy.  Advised patient to hold her oral chemotherapy until her shingles culture results are obtained.   Patient stated understanding of all instructions; and was in agreement with this plan of care. The patient knows to call the clinic with any problems, questions or concerns.   Review/collaboration with Dr. Jana Hakim regarding all aspects of patient's visit today.   Total time spent with patient was 25 minutes;  with greater than 75 percent of that time spent in face to face counseling regarding patient's symptoms,  and coordination of care and follow up.  Disclaimer: This note was  dictated with voice recognition software. Similar sounding words can inadvertently be transcribed and may not be corrected upon review.   Drue Second, NP 09/05/2014

## 2014-09-05 NOTE — Assessment & Plan Note (Signed)
Mild rash that initiated around her mouth; and is now extended to her bilateral nares.  She also had cracks to the corners of her mouth bilaterally as well.  She denies any oral lesions whatsoever.  On exam.-It does appear the patient has some scattered pink/red rash surrounding her mouth; and some tiny bumps at her nares as well.  There are no oral lesions.  With further questioning-patient remembered that she just switched her toothpaste approximate 3 weeks ago.  This would coincide with the initiation of her rash.  Advised patient that her.  Oral rash could very well be.  Oral dermatitis secondary to sensitivity to the new toothpaste.  However, patient has also developed a rash to her right upper back which does appear to be shingles.  Patient was advised to hold the new toothpaste; and try a more bland/natural toothpaste to see if that helps.

## 2014-09-05 NOTE — Telephone Encounter (Signed)
Encounter opened erroneously.   Closed encounter.   

## 2014-09-05 NOTE — Telephone Encounter (Signed)
Spoke with patient. Advised of message as seen below from [place]. Patient is agreeable and verbalizes understanding. PUS scheduled for 09/13/2014 at 11 am with 11:30 am consult with Dr.Silva. Advised will need to arrive for appointment 15 minutes early for check in. Patient is agreeable to date and time. Order has been placed for precert.  Cc: Theresia Lo  Routing to provider for final review. Patient agreeable to disposition. Will close encounter.   Patient aware provider will review message and nurse will return call if any additional advice or change of disposition.

## 2014-09-05 NOTE — Assessment & Plan Note (Signed)
Patient is status post bilateral mastectomy, chemotherapy, and radiation treatments.  She is currently undergoing Ibrance oral therapy, Xgeva, and Zoladex injections.    Patient states that she met with her oncology gynecologist just yesterday to discuss the removal of her ovaries.  She is anxious to undergo this surgery; so she can discontinue the Zoladex injections.  Patient is complaining of a rash to her perioral region; and has also noticed a different type rash to her right upper back since this past weekend.  It is quite possible that the rash to patient's back is shingles.  Patient was advised to hold her oral chemotherapy until herpetic culture results obtained.  Patient was given valacyclovir prescription to initiate today.  Patient is scheduled to return on 09/20/2014 for labs at her next Zoladex injection.

## 2014-09-05 NOTE — Telephone Encounter (Signed)
Left message to call Muna Demers at [phone]. 

## 2014-09-05 NOTE — Telephone Encounter (Signed)
-----   Message from Nunzio Cobbs, MD sent at 09/04/2014  8:22 PM EDT ----- Regarding: Please let patient know that I would like her to have a pelvic ultrasound I am planning a laparoscopic bilateral salpingo-oophorectomy for the patient for her breast cancer care, and I would like for her to have a pelvic ultrasound.   Her last imaging of the pelvis was a CT one year ago, and I need imaging that is a little more recent.  I have placed an order for this ultrasound, but want the patient to know before she gets a call from [facility] regarding a precert.   Thanks,   [employer]

## 2014-09-05 NOTE — Telephone Encounter (Deleted)
-----   Message from Nunzio Cobbs, MD sent at 09/04/2014  8:22 PM EDT ----- Regarding: Please let patient know that I would like her to have a pelvic ultrasound I am planning a laparoscopic bilateral salpingo-oophorectomy for the patient for her breast cancer care, and I would like for her to have a pelvic ultrasound.   Her last imaging of the pelvis was a CT one year ago, and I need imaging that is a little more recent.  I have placed an order for this ultrasound, but want the patient to know before she gets a call from [facility] regarding a precert.   Thanks,   [employer]

## 2014-09-05 NOTE — Assessment & Plan Note (Signed)
Patient has developed a rash to her right upper back since this past weekend.  She describes the rash is slightly pruritic and very painful.  She has also developed some.  Oral rash within the past 3 weeks; that she states has coincided with initiation of a new toothpaste.  On exam.  It does appear that patient has a raised, red rash to her right upper back.  There is no surrounding erythema, edema, warmth, or red streaks.  Quite possible that this new rash to patient's upper back is actually shingles.  A herpetic viral culture was obtained of the rash; pending results.  For the time being.-Will prescribe patient valacyclovir 1 g 3 times per day for treatment of possible shingles.  Also advised patient that the.  Oral rash.  She developed several weeks ago is most likely not related to her shingles outbreak; but instead related to her new toothpaste.  Patient states she is in the midst of her second week of Ibrance oral chemotherapy.  Advised patient to hold her oral chemotherapy until her shingles culture results are obtained.

## 2014-09-06 ENCOUNTER — Telehealth: Payer: Self-pay | Admitting: *Deleted

## 2014-09-06 LAB — HERPES SIMPLEX VIRUS CULTURE

## 2014-09-06 NOTE — Telephone Encounter (Signed)
TC to pt- advised pt results of herpes culture is negative. Per Selena Lesser, FNP pt is to hold IBrance until notification from Dr. Jana Hakim. Pt reports "stabbing deep pain that radiates to front of the chest". Rash has not spread and has not resolved at all. Pt reports relief from itch and pain using the Pepcid and benadryl combination. Pt to continue valtrex. Pt denies any questions at this time and verbalized an understanding.

## 2014-09-10 ENCOUNTER — Other Ambulatory Visit: Payer: Self-pay | Admitting: Oncology

## 2014-09-11 ENCOUNTER — Telehealth: Payer: Self-pay | Admitting: Obstetrics and Gynecology

## 2014-09-11 ENCOUNTER — Other Ambulatory Visit: Payer: Self-pay | Admitting: Oncology

## 2014-09-11 NOTE — Telephone Encounter (Signed)
Called patient to review benefits for ultrasound. Patient understood and agreeable. Verified appointment date/time. Ok to close

## 2014-09-12 ENCOUNTER — Telehealth: Payer: Self-pay | Admitting: *Deleted

## 2014-09-12 ENCOUNTER — Telehealth: Payer: Self-pay | Admitting: Oncology

## 2014-09-12 NOTE — Telephone Encounter (Signed)
Lm for pt to rtn call- pt needs to be seen sooner than 8/22 to restart IBrance. Cyndee Berniece Salines has availability Thursday and Friday.

## 2014-09-12 NOTE — Telephone Encounter (Signed)
S/w pt confirming MD visit with NP/CB per 08/17 POF.... Cherylann Banas

## 2014-09-12 NOTE — Telephone Encounter (Signed)
Lm for pt to rtn call to schedule appt for follow up- recheck rash in order to restart IBrance. POF sent for follow up with Heather or Symptom Management with Selena Lesser.

## 2014-09-13 ENCOUNTER — Other Ambulatory Visit: Payer: BLUE CROSS/BLUE SHIELD

## 2014-09-13 ENCOUNTER — Other Ambulatory Visit: Payer: BLUE CROSS/BLUE SHIELD | Admitting: Obstetrics and Gynecology

## 2014-09-13 ENCOUNTER — Telehealth: Payer: Self-pay | Admitting: Obstetrics and Gynecology

## 2014-09-13 NOTE — Telephone Encounter (Signed)
Spoke with patient. Patient apologizes for missing appointment today. States she is having a hard time remembering everything due to a lot of doctors appointments. Would like to reschedule PUS appointment at this time. Appointment rescheduled for 09/20/2014 at 10:30am with 11am consult with Dr.Silva. Patient is agreeable to date and time.   Cc: Leeroy Bock  Routing to provider for final review. Patient agreeable to disposition. Will close encounter.   Patient aware provider will review message and nurse will return call if any additional advice or change of disposition.

## 2014-09-13 NOTE — Telephone Encounter (Signed)
Patient did not keep her appointment for her PUS today. Per Dr. Quincy Simmonds, please call patient to check on status.

## 2014-09-14 ENCOUNTER — Encounter: Payer: BLUE CROSS/BLUE SHIELD | Admitting: Nurse Practitioner

## 2014-09-17 ENCOUNTER — Encounter: Payer: Self-pay | Admitting: Nurse Practitioner

## 2014-09-17 ENCOUNTER — Ambulatory Visit (HOSPITAL_BASED_OUTPATIENT_CLINIC_OR_DEPARTMENT_OTHER): Payer: BLUE CROSS/BLUE SHIELD | Admitting: Nurse Practitioner

## 2014-09-17 VITALS — BP 132/64 | HR 77 | Temp 98.4°F | Resp 18 | Ht 63.0 in | Wt 236.0 lb

## 2014-09-17 DIAGNOSIS — L710 Perioral dermatitis: Secondary | ICD-10-CM | POA: Diagnosis not present

## 2014-09-17 DIAGNOSIS — B029 Zoster without complications: Secondary | ICD-10-CM | POA: Diagnosis not present

## 2014-09-17 DIAGNOSIS — C50411 Malignant neoplasm of upper-outer quadrant of right female breast: Secondary | ICD-10-CM

## 2014-09-17 DIAGNOSIS — C7951 Secondary malignant neoplasm of bone: Secondary | ICD-10-CM

## 2014-09-17 NOTE — Progress Notes (Signed)
SYMPTOM MANAGEMENT CLINIC   HPI: Cheryl Moore 54 y.o. female diagnosed with breast cancer.  Patient is status post bilateral mastectomy, chemotherapy, and radiation therapy.  Currently undergoing Ibrance oral chemotherapy, Xgeva, and Zoladex therapy.  Patient presented to the [facility] on 09/04/2014 with complaint of a mild rash around her mouth and to her right upper back.  Patient remembered that she did start a new toothpaste approximately 2-3 weeks prior to the rash developing to her mouth.  Patient was diagnosed with probable perioral dermatitis; was advised to switch her toothpaste.  Rash to patient's right upper back appeared to be shingles; but shingles/herpetic culture obtained returned negative results.  Unable to determine if patient actually did have shingles; or it was a poor culture swab.  Patient did complete valacyclovir as directed.  Patient states that both the rash were mouth and the rash to her upper back has greatly improved within the past few weeks.  She has been holding her oral chemotherapy as directed.  Patient denies any other new symptoms whatsoever.  She denies any recent fevers or chills.  HPI  ROS  Past Medical History  Diagnosis Date  . Breast cancer     right  . Anxiety   . Hot flashes   . History of radiation therapy 11/28/12-03/06/13    right breast/64.4Gy/ right hip=37.5Gy   . Obesity   . Personal history of colonic polyps - ssp and adenomas 09/19/2013    Past Surgical History  Procedure Laterality Date  . Cesarean section  2005  . Breast surgery Right     breast bx  . Breast biopsy Right 05/23/2012    Procedure: SKIN PUNCH BIOPSY RIGHT BREAST;  Surgeon: [name], MD;  Location: [facility];  Service: General;  Laterality: Right;  . Portacath placement Left 05/23/2012    Procedure: INSERTION PORT-A-CATH;  Surgeon: [name], MD;  Location: [facility];  Service: General;  Laterality: Left;  . Total mastectomy Left 10/26/2012   Dr Donne Hazel  . Simple mastectomy with axillary sentinel node biopsy Left 10/26/2012    Procedure: TOTAL MASTECTOMY;  Surgeon: [name], MD;  Location: [facility];  Service: General;  Laterality: Left;  Marland Kitchen Mastectomy modified radical Right 10/26/2012    Procedure: MASTECTOMY MODIFIED RADICAL;  Surgeon: [name], MD;  Location: [facility];  Service: General;  Laterality: Right;  . Radiology with anesthesia N/A 08/24/2013    Procedure: MRI;  Surgeon: Medication Radiologist, MD;  Location: [facility];  Service: Radiology;  Laterality: N/A;  . Port-a-cath removal Left 04/10/2014    Procedure: REMOVAL PORT-A-CATH;  Surgeon: [name], MD;  Location: [facility];  Service: General;  Laterality: Left;    has Obesity; Dyspnea; History of colonic polyps; Breast cancer of upper-outer quadrant of right female breast; Nausea without vomiting; Perioral dermatitis; and Shingles on her problem list.    is allergic to other and penicillins.    Medication List       This list is accurate as of: 09/17/14  5:52 PM.  Always use your most recent med list.               ALPRAZolam 0.25 MG tablet  Commonly known as:  XANAX  Take 1 tablet (0.25 mg total) by mouth at bedtime as needed for anxiety or sleep.     CALCIUM PO  Take 1 tablet by mouth daily.     gabapentin 300 MG capsule  Commonly known as:  NEURONTIN  Take 1 capsule (300 mg  total) by mouth at bedtime.     gabapentin 100 MG capsule  Commonly known as:  NEURONTIN  Take 1 capsule (100 mg total) by mouth daily.     ibuprofen 200 MG tablet  Commonly known as:  ADVIL,MOTRIN  Take 200 mg by mouth every 8 (eight) hours as needed for pain.     letrozole 2.5 MG tablet  Commonly known as:  FEMARA  TAKE 1 TABLET BY MOUTH DAILY     multivitamin tablet  Take 1 tablet by mouth daily.     palbociclib 125 MG capsule  Commonly known as:  IBRANCE  Take 1 capsule (125 mg total) by mouth See admin instructions. Takes whole with food.  Takes one  tablets for 21 days, stops for 7 days, then repeats.     prochlorperazine 10 MG tablet  Commonly known as:  COMPAZINE  TAKE 1 TABLET (10 MG TOTAL) BY MOUTH EVERY 6 (SIX) HOURS AS NEEDED FOR NAUSEA OR VOMITING.     psyllium 58.6 % powder  Commonly known as:  METAMUCIL  Take 1 packet by mouth daily.     traMADol 50 MG tablet  Commonly known as:  ULTRAM  Take 1 tablet (50 mg total) by mouth every 6 (six) hours as needed for moderate pain.     valACYclovir 1000 MG tablet  Commonly known as:  VALTREX  Take 1 tablet (1,000 mg total) by mouth 3 (three) times daily.     venlafaxine 75 MG tablet  Commonly known as:  EFFEXOR  TAKE ONE TABLET BY MOUTH ONCE DAILY     VITAMIN C PO  Take 1 tablet by mouth daily.     XGEVA 120 MG/1.7ML Soln injection  Generic drug:  denosumab  Inject 120 mg into the skin every 30 (thirty) days.     ZOLADEX 10.8 MG injection  Generic drug:  goserelin  Inject 10.8 mg into the skin every 30 (thirty) days.         PHYSICAL EXAMINATION  Oncology Vitals 09/17/2014 09/04/2014 09/03/2014 08/23/2014 07/26/2014 06/28/2014 06/05/2014  Height 160 cm - - 160 cm - - -  Weight 107.049 kg 108.228 kg 107.049 kg 106.913 kg - - -  Weight (lbs) 236 lbs 238 lbs 10 oz 236 lbs 235 lbs 11 oz - - -  BMI (kg/m2) 41.81 kg/m2 - - 41.75 kg/m2 - - -  Temp 98.4 97.4 - 98.2 97.9 98.2 97.7  Pulse 77 80 80 69 70 74 67  Resp 18 16 16 18  - - -  SpO2 97 99 - - - - -  BSA (m2) 2.18 m2 - - 2.18 m2 - - -   BP Readings from Last 3 Encounters:  09/17/14 132/64  09/04/14 120/97  09/03/14 110/70    Physical Exam  Constitutional: She is oriented to person, place, and time and well-developed, well-nourished, and in no distress.  HENT:  Head: Normocephalic and atraumatic.  Mouth/Throat: Oropharynx is clear and moist.  Eyes: Conjunctivae and EOM are normal. Pupils are equal, round, and reactive to light. Right eye exhibits no discharge. Left eye exhibits no discharge. No scleral icterus.  Neck:  Normal range of motion.  Pulmonary/Chest: Effort normal. No respiratory distress.  Musculoskeletal: Normal range of motion.  Neurological: She is alert and oriented to person, place, and time. Gait normal.  Skin: Skin is warm and dry. Rash noted. No erythema. No pallor.  Trace rash appears to be resolving around patient's mouth.  Also, rash to patient's right upper  back appears to be resolving as well.  No new rash noted.  Psychiatric: Affect normal.  Nursing note and vitals reviewed.   LABORATORY DATA:. No visits with results within 3 Day(s) from this visit. Latest known visit with results is:  Orders Only on 09/04/2014  Component Date Value Ref Range Status  . Culture, Herpes 09/04/2014 Herpes Culture   Final   Final - ===== FINAL REPORT =====No Herpes Simplex Virus detected.     RADIOGRAPHIC STUDIES: No results found.  ASSESSMENT/PLAN:    Breast cancer of upper-outer quadrant of right female breast Patient is status post bilateral mastectomy, chemotherapy, and radiation treatments.  She is currently undergoing Ibrance oral therapy, Xgeva, and Zoladex injections.    Patient states that she has met with her oncology gynecologist to discuss the removal of her ovaries.  She is anxious to undergo this surgery; so she can discontinue the Zoladex injections.  Patient was complaining of a rash to her perioral region; and has also noticed a different type rash to her right upper back since this past weekend. ___________________________________________________________________________________________________  Recheck:     Pt has completed valacyclovir; although, the shingles/herpetic culture was negative.  Both the mild rash around patient's mouth and the right upper back rash have greatly improved since last exam.  Patient was advised that she could restart the Ibrance  oral chemotherapy.    Patient is scheduled to return on 09/20/2014 for labs at her next Zoladex  injection.    Perioral dermatitis Mild rash that initiated around her mouth; and is now extended to her bilateral nares.  She also had cracks to the corners of her mouth bilaterally as well.  She denies any oral lesions whatsoever.  On exam.-It does appear the patient has some scattered pink/red rash surrounding her mouth; and some tiny bumps at her nares as well.  There are no oral lesions.  With further questioning-patient remembered that she just switched her toothpaste approximate 3 weeks ago.  This would coincide with the initiation of her rash.  Advised patient that her.  Oral rash could very well be.  Oral dermatitis secondary to sensitivity to the new toothpaste.  Recheck: Patient reports that she stopped using the new toothpaste after her last visit here at the cancer center.  She continues with only a trace, resolving rash around her mouth.  Patient was advised she may also want to try an anti-fungal over-the-counter cream to see if that helps.  However, patient has also developed a rash to her right upper back which does appear to be shingles.  Patient was advised to hold the new toothpaste; and try a more bland/natural toothpaste to see if that helps.    Shingles Patient has developed a rash to her right upper back since this past weekend.  She describes the rash is slightly pruritic and very painful.  She has also developed some.  Oral rash within the past 3 weeks; that she states has coincided with initiation of a new toothpaste.  On exam.  It does appear that patient has a raised, red rash to her right upper back.  There is no surrounding erythema, edema, warmth, or red streaks.  Quite possible that this new rash to patient's upper back is actually shingles.  A herpetic viral culture was obtained of the rash; pending results.  For the time being.-Will prescribe patient valacyclovir 1 g 3 times per day for treatment of possible shingles.  Also advised patient that the.   Oral rash.  She developed several weeks ago is most likely not related to her shingles outbreak; but instead related to her new toothpaste.  Patient states she is in the midst of her second week of Ibrance oral chemotherapy.  Advised patient to hold her oral chemotherapy until her shingles culture results are obtained. _______________________________________________________________________________________________________  Recheck: Rash to patient's right upper back has greatly improved since last exam.  Patient states that she did complete the valacyclovir as directed.  However, shingles/herpetic culture will did return negative.  Unable to determine as patient was actually negative for shingles; or poor culture swab.  Patient was advised to call/return if her rash does not continue to improve.  Patient may very well require a dermatology referral if rash continues.  Patient was advised she could restart her Ibrance oral chemotherapy.    Patient stated understanding of all instructions; and was in agreement with this plan of care. The patient knows to call the clinic with any problems, questions or concerns.   Review/collaboration with Dr. Jana Hakim regarding all aspects of patient's visit today.   Total time spent with patient was 25 minutes;  with greater than 75 percent of that time spent in face to face counseling regarding patient's symptoms,  and coordination of care and follow up.  Disclaimer: This note was dictated with voice recognition software. Similar sounding words can inadvertently be transcribed and may not be corrected upon review.   Drue Second, NP 09/17/2014

## 2014-09-17 NOTE — Assessment & Plan Note (Signed)
Patient has developed a rash to her right upper back since this past weekend.  She describes the rash is slightly pruritic and very painful.  She has also developed some.  Oral rash within the past 3 weeks; that she states has coincided with initiation of a new toothpaste.  On exam.  It does appear that patient has a raised, red rash to her right upper back.  There is no surrounding erythema, edema, warmth, or red streaks.  Quite possible that this new rash to patient's upper back is actually shingles.  A herpetic viral culture was obtained of the rash; pending results.  For the time being.-Will prescribe patient valacyclovir 1 g 3 times per day for treatment of possible shingles.  Also advised patient that the.  Oral rash.  She developed several weeks ago is most likely not related to her shingles outbreak; but instead related to her new toothpaste.  Patient states she is in the midst of her second week of Ibrance oral chemotherapy.  Advised patient to hold her oral chemotherapy until her shingles culture results are obtained. _______________________________________________________________________________________________________  Recheck: Rash to patient's right upper back has greatly improved since last exam.  Patient states that she did complete the valacyclovir as directed.  However, shingles/herpetic culture will did return negative.  Unable to determine as patient was actually negative for shingles; or poor culture swab.  Patient was advised to call/return if her rash does not continue to improve.  Patient may very well require a dermatology referral if rash continues.  Patient was advised she could restart her Ibrance oral chemotherapy.

## 2014-09-17 NOTE — Assessment & Plan Note (Signed)
Mild rash that initiated around her mouth; and is now extended to her bilateral nares.  She also had cracks to the corners of her mouth bilaterally as well.  She denies any oral lesions whatsoever.  On exam.-It does appear the patient has some scattered pink/red rash surrounding her mouth; and some tiny bumps at her nares as well.  There are no oral lesions.  With further questioning-patient remembered that she just switched her toothpaste approximate 3 weeks ago.  This would coincide with the initiation of her rash.  Advised patient that her.  Oral rash could very well be.  Oral dermatitis secondary to sensitivity to the new toothpaste.  Recheck: Patient reports that she stopped using the new toothpaste after her last visit here at the cancer center.  She continues with only a trace, resolving rash around her mouth.  Patient was advised she may also want to try an anti-fungal over-the-counter cream to see if that helps.  However, patient has also developed a rash to her right upper back which does appear to be shingles.  Patient was advised to hold the new toothpaste; and try a more bland/natural toothpaste to see if that helps.

## 2014-09-17 NOTE — Assessment & Plan Note (Addendum)
Patient is status post bilateral mastectomy, chemotherapy, and radiation treatments.  She is currently undergoing Ibrance oral therapy, Xgeva, and Zoladex injections.    Patient states that she has met with her oncology gynecologist to discuss the removal of her ovaries.  She is anxious to undergo this surgery; so she can discontinue the Zoladex injections.  Patient was complaining of a rash to her perioral region; and has also noticed a different type rash to her right upper back since this past weekend. ___________________________________________________________________________________________________  Recheck:     Pt has completed valacyclovir; although, the shingles/herpetic culture was negative.  Both the mild rash around patient's mouth and the right upper back rash have greatly improved since last exam.  Patient was advised that she could restart the Ibrance  oral chemotherapy.    Patient is scheduled to return on 09/20/2014 for labs at her next Zoladex injection.

## 2014-09-18 ENCOUNTER — Telehealth: Payer: Self-pay | Admitting: Obstetrics and Gynecology

## 2014-09-18 NOTE — Telephone Encounter (Signed)
Spoke with patient. Reviewed benefit for procedure. Patient understood and agreeable. Verified arrival date/time for procedure. Patient agreeable. Ok to close. °

## 2014-09-20 ENCOUNTER — Encounter: Payer: Self-pay | Admitting: Obstetrics and Gynecology

## 2014-09-20 ENCOUNTER — Ambulatory Visit (INDEPENDENT_AMBULATORY_CARE_PROVIDER_SITE_OTHER): Payer: BLUE CROSS/BLUE SHIELD | Admitting: Obstetrics and Gynecology

## 2014-09-20 ENCOUNTER — Other Ambulatory Visit (HOSPITAL_BASED_OUTPATIENT_CLINIC_OR_DEPARTMENT_OTHER): Payer: BLUE CROSS/BLUE SHIELD

## 2014-09-20 ENCOUNTER — Ambulatory Visit (HOSPITAL_BASED_OUTPATIENT_CLINIC_OR_DEPARTMENT_OTHER): Payer: BLUE CROSS/BLUE SHIELD

## 2014-09-20 ENCOUNTER — Ambulatory Visit (INDEPENDENT_AMBULATORY_CARE_PROVIDER_SITE_OTHER): Payer: BLUE CROSS/BLUE SHIELD

## 2014-09-20 VITALS — BP 122/74 | HR 76 | Ht 63.0 in | Wt 236.0 lb

## 2014-09-20 VITALS — BP 136/60 | HR 73 | Temp 98.1°F

## 2014-09-20 DIAGNOSIS — C7951 Secondary malignant neoplasm of bone: Secondary | ICD-10-CM | POA: Diagnosis not present

## 2014-09-20 DIAGNOSIS — C50911 Malignant neoplasm of unspecified site of right female breast: Secondary | ICD-10-CM

## 2014-09-20 DIAGNOSIS — C50419 Malignant neoplasm of upper-outer quadrant of unspecified female breast: Secondary | ICD-10-CM

## 2014-09-20 DIAGNOSIS — C50411 Malignant neoplasm of upper-outer quadrant of right female breast: Secondary | ICD-10-CM

## 2014-09-20 DIAGNOSIS — Z5111 Encounter for antineoplastic chemotherapy: Secondary | ICD-10-CM

## 2014-09-20 LAB — CBC WITH DIFFERENTIAL/PLATELET
BASO%: 0.8 % (ref 0.0–2.0)
BASOS ABS: 0 10*3/uL (ref 0.0–0.1)
EOS%: 0.8 % (ref 0.0–7.0)
Eosinophils Absolute: 0 10*3/uL (ref 0.0–0.5)
HCT: 36.1 % (ref 34.8–46.6)
HEMOGLOBIN: 12.9 g/dL (ref 11.6–15.9)
LYMPH#: 1.8 10*3/uL (ref 0.9–3.3)
LYMPH%: 37.3 % (ref 14.0–49.7)
MCH: 37.4 pg — ABNORMAL HIGH (ref 25.1–34.0)
MCHC: 35.7 g/dL (ref 31.5–36.0)
MCV: 104.6 fL — ABNORMAL HIGH (ref 79.5–101.0)
MONO#: 0.3 10*3/uL (ref 0.1–0.9)
MONO%: 6.8 % (ref 0.0–14.0)
NEUT%: 54.3 % (ref 38.4–76.8)
NEUTROS ABS: 2.6 10*3/uL (ref 1.5–6.5)
Platelets: 242 10*3/uL (ref 145–400)
RBC: 3.45 10*6/uL — ABNORMAL LOW (ref 3.70–5.45)
RDW: 14.1 % (ref 11.2–14.5)
WBC: 4.9 10*3/uL (ref 3.9–10.3)

## 2014-09-20 LAB — COMPREHENSIVE METABOLIC PANEL (CC13)
ALBUMIN: 3.7 g/dL (ref 3.5–5.0)
ALK PHOS: 54 U/L (ref 40–150)
ALT: 50 U/L (ref 0–55)
AST: 26 U/L (ref 5–34)
Anion Gap: 10 mEq/L (ref 3–11)
BILIRUBIN TOTAL: 0.27 mg/dL (ref 0.20–1.20)
BUN: 15.1 mg/dL (ref 7.0–26.0)
CALCIUM: 9.4 mg/dL (ref 8.4–10.4)
CO2: 24 mEq/L (ref 22–29)
CREATININE: 1.1 mg/dL (ref 0.6–1.1)
Chloride: 107 mEq/L (ref 98–109)
EGFR: 57 mL/min/{1.73_m2} — ABNORMAL LOW (ref >90)
Glucose: 159 mg/dl — ABNORMAL HIGH (ref 70–140)
Potassium: 4.5 mEq/L (ref 3.5–5.1)
SODIUM: 141 meq/L (ref 136–145)
TOTAL PROTEIN: 6.5 g/dL (ref 6.4–8.3)

## 2014-09-20 MED ORDER — DENOSUMAB 120 MG/1.7ML ~~LOC~~ SOLN
120.0000 mg | Freq: Once | SUBCUTANEOUS | Status: AC
  Administered 2014-09-20: 120 mg via SUBCUTANEOUS
  Filled 2014-09-20: qty 1.7

## 2014-09-20 MED ORDER — GOSERELIN ACETATE 3.6 MG ~~LOC~~ IMPL
3.6000 mg | DRUG_IMPLANT | Freq: Once | SUBCUTANEOUS | Status: AC
  Administered 2014-09-20: 3.6 mg via SUBCUTANEOUS
  Filled 2014-09-20: qty 3.6

## 2014-09-20 NOTE — Progress Notes (Signed)
Subjective  54 y.o. G1P1001 Married Caucasian female here for pelvic ultrasound for   Evaluation of ovaries prior to laparoscopic bilateral salpingo-oophorectomy.  History of right breast cancer with metastases. Wants BSO for part of her treatment plan. This will allow her to come off Zoladex.  Oncologist participating in the planning/discussion of bilateral oophorectomy.   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  PET scan in May 2016 - negative.  CT of abdomen and pelvis on 06/27/13 showed normal ovaries and uterus.   No vaginal bleeding since April 2014.  No spotting every.   Objective  Pelvic ultrasound images and report reviewed with patient.  Uterus - normal.  EMS - 2.25 mm. Ovaries - normal. Free fluid - no        Assessment  Right breast cancer.  Normal pelvic anatomy on ultrasound.  Negative BRCA testing.   Plan  Discussion of normal anatomic findings. Proceed forward with laparoscopic bilateral salpingo-oophorectomy.  Patient and I discussed that I don't think that hysterectomy is necessary at this time.   Will precert and schedule laparoscopic BSO. Patient to return for preop visit.   ___15____ minutes face to face time of which over 50% was spent in counseling.    

## 2014-09-24 ENCOUNTER — Telehealth: Payer: Self-pay | Admitting: *Deleted

## 2014-09-24 NOTE — Telephone Encounter (Signed)
Call to patient. Reviewed date options and scheduling policies for surgery.  Date option of 10-22-14 discussed and patient agreeable. Desires to proceed ASAP.

## 2014-09-26 NOTE — Telephone Encounter (Signed)
Patient returned call. Surgery date confirmed and surgery instruction sheet reviewed; printed copy mailed to patient.  Routing to provider for final review. Patient agreeable to disposition. Will close encounter.

## 2014-09-26 NOTE — Telephone Encounter (Signed)
Call to patient to confirm surgery date of Monday 10-22-14 at 0730 at [facility]. Left message to call back. Will also need surgery instructions.

## 2014-10-03 ENCOUNTER — Telehealth: Payer: Self-pay | Admitting: *Deleted

## 2014-10-03 ENCOUNTER — Other Ambulatory Visit: Payer: Self-pay | Admitting: Oncology

## 2014-10-03 NOTE — Telephone Encounter (Signed)
This RN called pt per number on facesheet- obtained identified VM for pt. Detailed message left regarding need to stop the Ibrance now due to pending surgery.  This RN requested a return call per above for appropriate communication and documentation.

## 2014-10-03 NOTE — Telephone Encounter (Signed)
-----   Message from Chauncey Cruel, MD sent at 10/03/2014  1:07 PM EDT ----- Regarding: RE: Cheryl Moore prior to laparoscopic BSO? Let's stop the Ibrance now-- I am copying my nurse so she can confirm w the patient.  I will see her mid OCT and likely restart at that point  Thanks!  ----- Message -----    From: Nunzio Cobbs, MD    Sent: 09/30/2014  10:34 AM      To: Chauncey Cruel, MD Subject: Cheryl Moore prior to laparoscopic BS#  Hello Dr. Jana Hakim,  I will be performing a laparoscopic bilateral salpingo-oophorectomy for Cheryl Moore on 10/22/14 as part of her breast cancer care.   I see she is taking Ibrance.  Is it necessary for her to stop this perioperatively in order to reduce infection risk?  Her WBC and differential were normal 10 days ago.  If she needs to stop, when, and for how long?  Thank you for your input.  [place]

## 2014-10-04 ENCOUNTER — Telehealth: Payer: Self-pay | Admitting: Oncology

## 2014-10-04 NOTE — Telephone Encounter (Signed)
s.w. pt and advised on OCT appt....pt ok and aware °

## 2014-10-04 NOTE — Telephone Encounter (Signed)
[facility] sent facsimile confirmation of Ibrance prescription shipment.  [name] was shipped on 10-02-2014 with next business day delivery.

## 2014-10-04 NOTE — Telephone Encounter (Signed)
s.w and lvm for pt regarding to MD visit in [place].Marland KitchenMarland KitchenMarland Kitchen

## 2014-10-08 ENCOUNTER — Encounter: Payer: Self-pay | Admitting: Obstetrics and Gynecology

## 2014-10-08 ENCOUNTER — Ambulatory Visit (INDEPENDENT_AMBULATORY_CARE_PROVIDER_SITE_OTHER): Payer: BLUE CROSS/BLUE SHIELD | Admitting: Obstetrics and Gynecology

## 2014-10-08 VITALS — Ht 63.0 in

## 2014-10-08 DIAGNOSIS — C50919 Malignant neoplasm of unspecified site of unspecified female breast: Secondary | ICD-10-CM | POA: Diagnosis not present

## 2014-10-08 NOTE — Progress Notes (Signed)
Patient ID: Cheryl Moore, female   DOB: [birth year], 54 y.o.   MRN: [id number] GYNECOLOGY  VISIT   HPI: 54 y.o.   Married  Caucasian  female   G1P1001 with No LMP recorded. Patient is not currently having periods (Reason: Chemotherapy).   here for surgical consult.   Desires ovarian removal for breast cancer treatment.  PET scan negative.  Pelvic ultrasound 09/20/14 shows normal ovaries and ovaries.  No vaginal bleeding.   BRCA negative.   Stopped Ibrance on 09/30/14.  Hx of C/S x 1.  Takes Effexor for hot flashes.   GYNECOLOGIC HISTORY: No LMP recorded. Patient is not currently having periods (Reason: Chemotherapy). Contraception:None Menopausal hormone therapy: none Last mammogram: Rt.mastectomy 2014--Bil.MRI 09-22-12 Density B/The previously noted areas of abnormal enhancement in the right breast and abnormal right axillary lymph nodes are smaller compared to previous MRI of April 2014 suggesting respond to neoadjuvant therapy as described. Last pap smear: 05-02-12 Neg        OB History    Gravida Para Term Preterm AB TAB SAB Ectopic Multiple Living   1 1 1       1          Patient Active Problem List   Diagnosis Date Noted  . Perioral dermatitis 09/05/2014  . Shingles 09/05/2014  . Nausea without vomiting 03/09/2014  . Breast cancer of upper-outer quadrant of right female breast 10/06/2013  . History of colonic polyps 09/19/2013  . Dyspnea 09/21/2012  . Obesity 05/02/2012    Past Medical History  Diagnosis Date  . Breast cancer     right  . Anxiety   . Hot flashes   . History of radiation therapy 11/28/12-03/06/13    right breast/64.4Gy/ right hip=37.5Gy   . Obesity   . Personal history of colonic polyps - ssp and adenomas 09/19/2013    Past Surgical History  Procedure Laterality Date  . Cesarean section  2005  . Breast surgery Right     breast bx  . Breast biopsy Right 05/23/2012    Procedure: SKIN PUNCH BIOPSY RIGHT BREAST;  Surgeon: [name], MD;   Location: [place];  Service: General;  Laterality: Right;  . Portacath placement Left 05/23/2012    Procedure: INSERTION PORT-A-CATH;  Surgeon: [name], MD;  Location: [place];  Service: General;  Laterality: Left;  . Total mastectomy Left 10/26/2012    Dr Donne Hazel  . Simple mastectomy with axillary sentinel node biopsy Left 10/26/2012    Procedure: TOTAL MASTECTOMY;  Surgeon: [name], MD;  Location: [place];  Service: General;  Laterality: Left;  Marland Kitchen Mastectomy modified radical Right 10/26/2012    Procedure: MASTECTOMY MODIFIED RADICAL;  Surgeon: [name], MD;  Location: [place];  Service: General;  Laterality: Right;  . Radiology with anesthesia N/A 08/24/2013    Procedure: MRI;  Surgeon: Medication Radiologist, MD;  Location: [place];  Service: Radiology;  Laterality: N/A;  . Port-a-cath removal Left 04/10/2014    Procedure: REMOVAL PORT-A-CATH;  Surgeon: [name], MD;  Location: [place];  Service: General;  Laterality: Left;    Current Outpatient Prescriptions  Medication Sig Dispense Refill  . ALPRAZolam (XANAX) 0.25 MG tablet Take 1 tablet (0.25 mg total) by mouth at bedtime as needed for anxiety or sleep. 30 tablet 3  . Ascorbic Acid (VITAMIN C PO) Take 1 tablet by mouth daily.     Marland Kitchen CALCIUM PO Take 1 tablet by mouth daily.    Marland Kitchen denosumab (XGEVA) 120 MG/1.7ML SOLN injection Inject  120 mg into the skin every 30 (thirty) days.    Marland Kitchen gabapentin (NEURONTIN) 100 MG capsule Take 1 capsule (100 mg total) by mouth daily. 90 capsule 2  . gabapentin (NEURONTIN) 300 MG capsule Take 1 capsule (300 mg total) by mouth at bedtime. 90 capsule 4  . goserelin (ZOLADEX) 10.8 MG injection Inject 10.8 mg into the skin every 30 (thirty) days.     Marland Kitchen ibuprofen (ADVIL,MOTRIN) 200 MG tablet Take 200 mg by mouth every 8 (eight) hours as needed for pain.     Marland Kitchen letrozole (FEMARA) 2.5 MG tablet TAKE 1 TABLET BY MOUTH DAILY 30 tablet 2  . Multiple Vitamin (MULTIVITAMIN) tablet Take 1 tablet by  mouth daily.    . palbociclib (IBRANCE) 125 MG capsule Take 1 capsule (125 mg total) by mouth See admin instructions. Takes whole with food.  Takes one tablets for 21 days, stops for 7 days, then repeats. 21 capsule 3  . prochlorperazine (COMPAZINE) 10 MG tablet TAKE 1 TABLET (10 MG TOTAL) BY MOUTH EVERY 6 (SIX) HOURS AS NEEDED FOR NAUSEA OR VOMITING. 30 tablet 2  . psyllium (METAMUCIL) 58.6 % powder Take 1 packet by mouth daily.    . traMADol (ULTRAM) 50 MG tablet Take 1 tablet (50 mg total) by mouth every 6 (six) hours as needed for moderate pain. (Patient taking differently: Take 50 mg by mouth at bedtime. ) 30 tablet 3  . valACYclovir (VALTREX) 1000 MG tablet Take 1 tablet (1,000 mg total) by mouth 3 (three) times daily. 21 tablet 0  . venlafaxine (EFFEXOR) 75 MG tablet TAKE ONE TABLET BY MOUTH ONCE DAILY 30 tablet 6   No current facility-administered medications for this visit.     ALLERGIES: Other and Penicillins  Family History  Problem Relation Age of Onset  . Adopted: Yes    Social History   Social History  . Marital Status: Married    Spouse Name: N/A  . Number of Children: 1  . Years of Education: N/A   Occupational History  . Housewife    Social History Main Topics  . Smoking status: Former Smoker -- 0.25 packs/day for 5 years    Types: Cigarettes    Quit date: 05/21/2002  . Smokeless tobacco: Never Used  . Alcohol Use: No  . Drug Use: No  . Sexual Activity:    Partners: Male    Birth Control/ Protection: None   Other Topics Concern  . 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  PHYSICAL EXAMINATION:    Ht 5' 3"  (1.6 m)    General appearance: alert, cooperative and appears stated age Head: Normocephalic, without obvious abnormality, atraumatic Neck: no adenopathy, supple, symmetrical, trachea midline and thyroid normal to inspection and palpation Lungs:  clear to auscultation bilaterally   Heart: regular rate and rhythm Abdomen: soft, non-tender; bowel sounds normal; no masses,  no organomegaly Extremities: extremities normal, atraumatic, no cyanosis or edema Skin: Skin color, texture, turgor normal. No rashes or lesions Lymph nodes: Cervical, supraclavicular, and axillary nodes normal. No abnormal inguinal nodes palpated Neurologic: Grossly normal  Pelvic: External genitalia:  no lesions              Urethra:  normal appearing urethra with no masses, tenderness or lesions              Bartholins and Skenes:  normal                 Vagina: normal appearing vagina with normal color and discharge, no lesions              Cervix: anteverted and no lesions          Bimanual Exam:  Uterus:  normal size, contour, position, consistency, mobility, non-tender.  Exam limited by [facility].              Adnexa: normal adnexa and no mass, fullness, tenderness         Chaperone was present for exam.  ASSESSMENT  Hx of metastatic breast cancer.  Off Ibrance in preparation for surgery.  Hx Cesarean Section.   PLAN  Proceed with laparoscopic bilateral salpingo-oophorectomy.  Risks, benefits, and alternatives discussed with the patient who wishes to proceed.  Discussed possible risk of laparotomy.  Will do bowel prep with magnesium citrate.    An After Visit Summary was printed and given to the patient.  ___15___ minutes face to face time of which over 50% was spent in counseling.

## 2014-10-12 ENCOUNTER — Encounter (HOSPITAL_COMMUNITY)
Admission: RE | Admit: 2014-10-12 | Discharge: 2014-10-12 | Disposition: A | Discharge status: Home / Self Care | Payer: BLUE CROSS/BLUE SHIELD | Source: Ambulatory Visit | Attending: Obstetrics and Gynecology | Admitting: Obstetrics and Gynecology

## 2014-10-12 ENCOUNTER — Encounter (HOSPITAL_COMMUNITY): Payer: Self-pay

## 2014-10-12 DIAGNOSIS — Z853 Personal history of malignant neoplasm of breast: Secondary | ICD-10-CM | POA: Diagnosis not present

## 2014-10-12 DIAGNOSIS — Z01818 Encounter for other preprocedural examination: Secondary | ICD-10-CM | POA: Insufficient documentation

## 2014-10-12 HISTORY — DX: Myoneural disorder, unspecified: G70.9

## 2014-10-12 LAB — BASIC METABOLIC PANEL
ANION GAP: 12 (ref 5–15)
BUN: 14 mg/dL (ref 6–20)
CHLORIDE: 104 mmol/L (ref 101–111)
CO2: 21 mmol/L — ABNORMAL LOW (ref 22–32)
CREATININE: 0.86 mg/dL (ref 0.44–1.00)
Calcium: 9.5 mg/dL (ref 8.9–10.3)
GFR calc non Af Amer: >60 mL/min (ref >60)
Glucose, Bld: 127 mg/dL — ABNORMAL HIGH (ref 65–99)
POTASSIUM: 3.9 mmol/L (ref 3.5–5.1)
SODIUM: 137 mmol/L (ref 135–145)

## 2014-10-12 LAB — CBC
HEMATOCRIT: 37.5 % (ref 36.0–46.0)
HEMOGLOBIN: 13.4 g/dL (ref 12.0–15.0)
MCH: 36.9 pg — ABNORMAL HIGH (ref 26.0–34.0)
MCHC: 35.7 g/dL (ref 30.0–36.0)
MCV: 103.3 fL — AB (ref 78.0–100.0)
PLATELETS: 220 10*3/uL (ref 150–400)
RBC: 3.63 MIL/uL — AB (ref 3.87–5.11)
RDW: 13.9 % (ref 11.5–15.5)
WBC: 4.7 10*3/uL (ref 4.0–10.5)

## 2014-10-12 NOTE — Patient Instructions (Addendum)
Your procedure is scheduled on:  October 22, 2014  Enter through the Main Entrance of [facility] at:  6:00 am   Pick up the phone at the desk and dial [phone].  Call this number if you have problems the morning of surgery: [phone].  Remember: Do NOT eat food:  After midnight Sunday 10/21/14 Do NOT drink clear liquids after:  Midnight on Sunday 10/21/14   Take these medicines the morning of surgery with a SIP OF WATER:  effexor   Do NOT wear jewelry (body piercing), metal hair clips/bobby pins, make-up, or nail polish. Do NOT wear lotions, powders, or perfumes.  You may wear deoderant. Do NOT shave for 48 hours prior to surgery. Do NOT bring valuables to the hospital. Contacts, dentures, or bridgework may not be worn into surgery. Have a responsible adult drive you home and stay with you for 24 hours after your procedure

## 2014-10-18 ENCOUNTER — Other Ambulatory Visit (HOSPITAL_BASED_OUTPATIENT_CLINIC_OR_DEPARTMENT_OTHER): Payer: BLUE CROSS/BLUE SHIELD

## 2014-10-18 ENCOUNTER — Ambulatory Visit (HOSPITAL_BASED_OUTPATIENT_CLINIC_OR_DEPARTMENT_OTHER): Payer: BLUE CROSS/BLUE SHIELD

## 2014-10-18 VITALS — BP 126/69 | HR 77 | Temp 98.9°F

## 2014-10-18 DIAGNOSIS — C50411 Malignant neoplasm of upper-outer quadrant of right female breast: Secondary | ICD-10-CM | POA: Diagnosis not present

## 2014-10-18 DIAGNOSIS — C7951 Secondary malignant neoplasm of bone: Secondary | ICD-10-CM

## 2014-10-18 DIAGNOSIS — C50419 Malignant neoplasm of upper-outer quadrant of unspecified female breast: Secondary | ICD-10-CM

## 2014-10-18 DIAGNOSIS — Z5111 Encounter for antineoplastic chemotherapy: Secondary | ICD-10-CM | POA: Diagnosis not present

## 2014-10-18 LAB — COMPREHENSIVE METABOLIC PANEL (CC13)
ALBUMIN: 3.6 g/dL (ref 3.5–5.0)
ALK PHOS: 58 U/L (ref 40–150)
ALT: 47 U/L (ref 0–55)
ANION GAP: 7 meq/L (ref 3–11)
AST: 28 U/L (ref 5–34)
BUN: 12.5 mg/dL (ref 7.0–26.0)
CALCIUM: 9 mg/dL (ref 8.4–10.4)
CO2: 24 mEq/L (ref 22–29)
CREATININE: 0.9 mg/dL (ref 0.6–1.1)
Chloride: 108 mEq/L (ref 98–109)
EGFR: 69 mL/min/{1.73_m2} — AB (ref >90)
Glucose: 184 mg/dl — ABNORMAL HIGH (ref 70–140)
Potassium: 4.2 mEq/L (ref 3.5–5.1)
Sodium: 140 mEq/L (ref 136–145)
TOTAL PROTEIN: 6.5 g/dL (ref 6.4–8.3)

## 2014-10-18 LAB — CBC WITH DIFFERENTIAL/PLATELET
BASO%: 1 % (ref 0.0–2.0)
Basophils Absolute: 0 10*3/uL (ref 0.0–0.1)
EOS ABS: 0 10*3/uL (ref 0.0–0.5)
EOS%: 0.9 % (ref 0.0–7.0)
HEMATOCRIT: 37.2 % (ref 34.8–46.6)
HEMOGLOBIN: 12.9 g/dL (ref 11.6–15.9)
LYMPH#: 1.6 10*3/uL (ref 0.9–3.3)
LYMPH%: 34.1 % (ref 14.0–49.7)
MCH: 36.6 pg — ABNORMAL HIGH (ref 25.1–34.0)
MCHC: 34.5 g/dL (ref 31.5–36.0)
MCV: 105.9 fL — AB (ref 79.5–101.0)
MONO#: 0.4 10*3/uL (ref 0.1–0.9)
MONO%: 7.7 % (ref 0.0–14.0)
NEUT%: 56.3 % (ref 38.4–76.8)
NEUTROS ABS: 2.6 10*3/uL (ref 1.5–6.5)
PLATELETS: 304 10*3/uL (ref 145–400)
RBC: 3.52 10*6/uL — ABNORMAL LOW (ref 3.70–5.45)
RDW: 13.5 % (ref 11.2–14.5)
WBC: 4.6 10*3/uL (ref 3.9–10.3)

## 2014-10-18 MED ORDER — GOSERELIN ACETATE 3.6 MG ~~LOC~~ IMPL
3.6000 mg | DRUG_IMPLANT | Freq: Once | SUBCUTANEOUS | Status: AC
  Administered 2014-10-18: 3.6 mg via SUBCUTANEOUS
  Filled 2014-10-18: qty 3.6

## 2014-10-18 MED ORDER — DENOSUMAB 120 MG/1.7ML ~~LOC~~ SOLN
120.0000 mg | Freq: Once | SUBCUTANEOUS | Status: AC
  Administered 2014-10-18: 120 mg via SUBCUTANEOUS
  Filled 2014-10-18: qty 1.7

## 2014-10-20 ENCOUNTER — Encounter (HOSPITAL_COMMUNITY): Payer: Self-pay | Admitting: Anesthesiology

## 2014-10-20 NOTE — Anesthesia Preprocedure Evaluation (Addendum)
Anesthesia Evaluation  Patient identified by MRN, date of birth, ID band Patient awake    Reviewed: Allergy & Precautions, NPO status , Patient's Chart, lab work & pertinent test results  Airway Mallampati: IV  TM Distance: >3 FB Neck ROM: Full    Dental no notable dental hx. 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  Neuromuscular disease    GI/Hepatic negative GI ROS, Neg liver ROS,   Endo/Other  Morbid obesityBreast Ca  Renal/GU negative Renal ROS  negative genitourinary   Musculoskeletal negative musculoskeletal ROS (+)   Abdominal (+) + obese,   Peds  Hematology   Anesthesia Other Findings   Reproductive/Obstetrics negative OB ROS                            Anesthesia Physical Anesthesia Plan  ASA: III  Anesthesia Plan: General   Post-op Pain Management:    Induction: Intravenous  Airway Management Planned: Oral ETT  Additional Equipment:   Intra-op Plan:   Post-operative Plan: Extubation in OR  Informed Consent: I have reviewed the patients History and Physical, chart, labs and discussed the procedure including the risks, benefits and alternatives for the proposed anesthesia with the patient or authorized representative who has indicated his/her understanding and acceptance.   Dental advisory given  Plan Discussed with: Anesthesiologist, CRNA and Surgeon  Anesthesia Plan Comments:         Anesthesia Quick Evaluation

## 2014-10-21 MED ORDER — CLINDAMYCIN PHOSPHATE 900 MG/50ML IV SOLN
900.0000 mg | Freq: Once | INTRAVENOUS | Status: AC
  Administered 2014-10-22: 900 mg via INTRAVENOUS
  Filled 2014-10-21: qty 50

## 2014-10-21 MED ORDER — CLINDAMYCIN PHOSPHATE 900 MG/50ML IV SOLN
900.0000 mg | Freq: Once | INTRAVENOUS | Status: DC
  Filled 2014-10-21: qty 50

## 2014-10-21 NOTE — H&P (Signed)
Nunzio Cobbs, MD at 10/08/2014 12:38 PM     Status: Signed       Expand All Collapse All   Patient ID: Cheryl Moore, female DOB: 01/23/1961, 54 y.o. MRN: [id number] GYNECOLOGY VISIT  HPI: 54 y.o. Married Caucasian female  G1P1001 with No LMP recorded. Patient is not currently having periods (Reason: Chemotherapy).  here for surgical consult.   Desires ovarian removal for breast cancer treatment.  PET scan negative.  Pelvic ultrasound 09/20/14 shows normal ovaries and ovaries.  No vaginal bleeding.   BRCA negative.   Stopped Ibrance on 09/30/14.  Hx of C/S x 1.  Takes Effexor for hot flashes.   GYNECOLOGIC HISTORY: No LMP recorded. Patient is not currently having periods (Reason: Chemotherapy). Contraception:None Menopausal hormone therapy: none Last mammogram: Rt.mastectomy 2014--Bil.MRI 09-22-12 Density B/The previously noted areas of abnormal enhancement in the right breast and abnormal right axillary lymph nodes are smaller compared to previous MRI of April 2014 suggesting respond to neoadjuvant therapy as described. Last pap smear: 05-02-12 Neg   OB History    Gravida Para Term Preterm AB TAB SAB Ectopic Multiple Living   1 1 1       1        Patient Active Problem List   Diagnosis Date Noted  . Perioral dermatitis 09/05/2014  . Shingles 09/05/2014  . Nausea without vomiting 03/09/2014  . Breast cancer of upper-outer quadrant of right female breast 10/06/2013  . History of colonic polyps 09/19/2013  . Dyspnea 09/21/2012  . Obesity 05/02/2012    Past Medical History  Diagnosis Date  . Breast cancer     right  . Anxiety   . Hot flashes   . History of radiation therapy 11/28/12-03/06/13    right breast/64.4Gy/ right hip=37.5Gy   . Obesity   . Personal history of colonic polyps - ssp and adenomas 09/19/2013    Past Surgical History  Procedure  Laterality Date  . Cesarean section  2005  . Breast surgery Right     breast bx  . Breast biopsy Right 05/23/2012    Procedure: SKIN PUNCH BIOPSY RIGHT BREAST; Surgeon: [name], MD; Location: [place]; Service: General; Laterality: Right;  . Portacath placement Left 05/23/2012    Procedure: INSERTION PORT-A-CATH; Surgeon: [name], MD; Location: [place]; Service: General; Laterality: Left;  . Total mastectomy Left 10/26/2012    Dr Donne Hazel  . Simple mastectomy with axillary sentinel node biopsy Left 10/26/2012    Procedure: TOTAL MASTECTOMY; Surgeon: [name], MD; Location: [place]; Service: General; Laterality: Left;  Marland Kitchen Mastectomy modified radical Right 10/26/2012    Procedure: MASTECTOMY MODIFIED RADICAL; Surgeon: [name], MD; Location: [place]; Service: General; Laterality: Right;  . Radiology with anesthesia N/A 08/24/2013    Procedure: MRI; Surgeon: Medication Radiologist, MD; Location: [place]; Service: Radiology; Laterality: N/A;  . Port-a-cath removal Left 04/10/2014    Procedure: REMOVAL PORT-A-CATH; Surgeon: [name], MD; Location: [place]; Service: General; Laterality: Left;    Current Outpatient Prescriptions  Medication Sig Dispense Refill  . ALPRAZolam (XANAX) 0.25 MG tablet Take 1 tablet (0.25 mg total) by mouth at bedtime as needed for anxiety or sleep. 30 tablet 3  . Ascorbic Acid (VITAMIN C PO) Take 1 tablet by mouth daily.     Marland Kitchen CALCIUM PO Take 1 tablet by mouth daily.    Marland Kitchen denosumab (XGEVA) 120 MG/1.7ML SOLN injection Inject 120 mg into the skin every 30 (thirty) days.    Marland Kitchen gabapentin (NEURONTIN)  100 MG capsule Take 1 capsule (100 mg total) by mouth daily. 90 capsule 2  . gabapentin (NEURONTIN) 300 MG capsule Take 1 capsule (300 mg total) by mouth at bedtime. 90 capsule 4  . goserelin (ZOLADEX) 10.8 MG injection  Inject 10.8 mg into the skin every 30 (thirty) days.     Marland Kitchen ibuprofen (ADVIL,MOTRIN) 200 MG tablet Take 200 mg by mouth every 8 (eight) hours as needed for pain.     Marland Kitchen letrozole (FEMARA) 2.5 MG tablet TAKE 1 TABLET BY MOUTH DAILY 30 tablet 2  . Multiple Vitamin (MULTIVITAMIN) tablet Take 1 tablet by mouth daily.    . palbociclib (IBRANCE) 125 MG capsule Take 1 capsule (125 mg total) by mouth See admin instructions. Takes whole with food. Takes one tablets for 21 days, stops for 7 days, then repeats. 21 capsule 3  . prochlorperazine (COMPAZINE) 10 MG tablet TAKE 1 TABLET (10 MG TOTAL) BY MOUTH EVERY 6 (SIX) HOURS AS NEEDED FOR NAUSEA OR VOMITING. 30 tablet 2  . psyllium (METAMUCIL) 58.6 % powder Take 1 packet by mouth daily.    . traMADol (ULTRAM) 50 MG tablet Take 1 tablet (50 mg total) by mouth every 6 (six) hours as needed for moderate pain. (Patient taking differently: Take 50 mg by mouth at bedtime. ) 30 tablet 3  . valACYclovir (VALTREX) 1000 MG tablet Take 1 tablet (1,000 mg total) by mouth 3 (three) times daily. 21 tablet 0  . venlafaxine (EFFEXOR) 75 MG tablet TAKE ONE TABLET BY MOUTH ONCE DAILY 30 tablet 6   No current facility-administered medications for this visit.     ALLERGIES: Other and Penicillins  Family History  Problem Relation Age of Onset  . Adopted: Yes    Social History   Social History  . Marital Status: Married    Spouse Name: N/A  . Number of Children: 1  . Years of Education: N/A   Occupational History  . Housewife    Social History Main Topics  . Smoking status: Former Smoker -- 0.25 packs/day for 5 years    Types: Cigarettes    Quit date: 05/21/2002  . Smokeless tobacco: Never Used  . Alcohol Use: No  . Drug Use: No  . Sexual Activity:    Partners: Male    Birth Control/ Protection: None   Other Topics Concern  . 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  PHYSICAL EXAMINATION:   Ht 5' 3"  (1.6 m)  General appearance: alert, cooperative and appears stated age Head: Normocephalic, without obvious abnormality, atraumatic Neck: no adenopathy, supple, symmetrical, trachea midline and thyroid normal to inspection and palpation Lungs: clear to auscultation bilaterally  Heart: regular rate and rhythm Abdomen: soft, non-tender; bowel sounds normal; no masses, no organomegaly Extremities: extremities normal, atraumatic, no cyanosis or edema Skin: Skin color, texture, turgor normal. No rashes or lesions Lymph nodes: Cervical, supraclavicular, and axillary nodes normal. No abnormal inguinal nodes palpated Neurologic: Grossly normal  Pelvic: External genitalia: no lesions  Urethra: normal appearing urethra with no masses, tenderness or lesions  Bartholins and Skenes: normal   Vagina: normal appearing vagina with normal color and discharge, no lesions  Cervix: anteverted and no lesions   Bimanual Exam: Uterus: normal size, contour, position, consistency, mobility, non-tender. Exam limited by [facility].  Adnexa: normal adnexa and no mass, fullness, tenderness  Chaperone was present for exam.  ASSESSMENT  Hx of metastatic breast cancer. Off Ibrance in preparation for surgery.  Hx Cesarean Section.   PLAN  Proceed with laparoscopic bilateral salpingo-oophorectomy. Risks, benefits, and alternatives discussed with the patient who wishes to proceed. Discussed possible risk of laparotomy.  Will do bowel prep with magnesium citrate.   An After Visit Summary was printed and given to the patient.  ___15___ minutes face to face time of which over 50% was spent in counseling.

## 2014-10-22 ENCOUNTER — Encounter (HOSPITAL_COMMUNITY): Payer: Self-pay | Admitting: Anesthesiology

## 2014-10-22 ENCOUNTER — Telehealth: Payer: Self-pay | Admitting: Oncology

## 2014-10-22 ENCOUNTER — Encounter (HOSPITAL_COMMUNITY)
Admission: RE | Disposition: A | Discharge status: Home / Self Care | Payer: Self-pay | Source: Ambulatory Visit | Attending: Obstetrics and Gynecology

## 2014-10-22 ENCOUNTER — Ambulatory Visit (HOSPITAL_COMMUNITY)
Admission: RE | Admit: 2014-10-22 | Discharge: 2014-10-22 | Disposition: A | Discharge status: Home / Self Care | Payer: BLUE CROSS/BLUE SHIELD | Source: Ambulatory Visit | Attending: Obstetrics and Gynecology | Admitting: Obstetrics and Gynecology

## 2014-10-22 ENCOUNTER — Ambulatory Visit (HOSPITAL_COMMUNITY): Payer: BLUE CROSS/BLUE SHIELD | Admitting: Anesthesiology

## 2014-10-22 DIAGNOSIS — Z6841 Body Mass Index (BMI) 40.0 and over, adult: Secondary | ICD-10-CM | POA: Insufficient documentation

## 2014-10-22 DIAGNOSIS — R0602 Shortness of breath: Secondary | ICD-10-CM | POA: Diagnosis not present

## 2014-10-22 DIAGNOSIS — Z853 Personal history of malignant neoplasm of breast: Secondary | ICD-10-CM | POA: Insufficient documentation

## 2014-10-22 DIAGNOSIS — Z880 Allergy status to penicillin: Secondary | ICD-10-CM | POA: Diagnosis not present

## 2014-10-22 DIAGNOSIS — N838 Other noninflammatory disorders of ovary, fallopian tube and broad ligament: Secondary | ICD-10-CM | POA: Insufficient documentation

## 2014-10-22 DIAGNOSIS — Z4002 Encounter for prophylactic removal of ovary: Secondary | ICD-10-CM | POA: Diagnosis present

## 2014-10-22 DIAGNOSIS — Z87891 Personal history of nicotine dependence: Secondary | ICD-10-CM | POA: Insufficient documentation

## 2014-10-22 DIAGNOSIS — Z9013 Acquired absence of bilateral breasts and nipples: Secondary | ICD-10-CM | POA: Diagnosis not present

## 2014-10-22 DIAGNOSIS — Z888 Allergy status to other drugs, medicaments and biological substances status: Secondary | ICD-10-CM | POA: Diagnosis not present

## 2014-10-22 DIAGNOSIS — C50919 Malignant neoplasm of unspecified site of unspecified female breast: Secondary | ICD-10-CM | POA: Diagnosis not present

## 2014-10-22 HISTORY — PX: LAPAROSCOPIC SALPINGO OOPHERECTOMY: SHX5927

## 2014-10-22 LAB — PREGNANCY, URINE: PREG TEST UR: NEGATIVE

## 2014-10-22 SURGERY — SALPINGO-OOPHORECTOMY, LAPAROSCOPIC
Anesthesia: General | Site: Abdomen | Laterality: Bilateral

## 2014-10-22 MED ORDER — NEOSTIGMINE METHYLSULFATE 10 MG/10ML IV SOLN
INTRAVENOUS | Status: DC | PRN
  Administered 2014-10-22: 3 mg via INTRAVENOUS

## 2014-10-22 MED ORDER — GLYCOPYRROLATE 0.2 MG/ML IJ SOLN
INTRAMUSCULAR | Status: DC | PRN
  Administered 2014-10-22: 0.6 mg via INTRAVENOUS
  Administered 2014-10-22: 0.1 mg via INTRAVENOUS

## 2014-10-22 MED ORDER — MEPERIDINE HCL 25 MG/ML IJ SOLN: 6.2500 mg | INTRAMUSCULAR | Status: DC | PRN

## 2014-10-22 MED ORDER — IBUPROFEN 800 MG PO TABS
800.0000 mg | ORAL_TABLET | Freq: Three times a day (TID) | ORAL | Status: AC | PRN
Start: 2014-10-22 — End: ?

## 2014-10-22 MED ORDER — PHENYLEPHRINE 40 MCG/ML (10ML) SYRINGE FOR IV PUSH (FOR BLOOD PRESSURE SUPPORT)
PREFILLED_SYRINGE | INTRAVENOUS | Status: AC
  Filled 2014-10-22: qty 10

## 2014-10-22 MED ORDER — LACTATED RINGERS IV SOLN
INTRAVENOUS | Status: DC
  Administered 2014-10-22: 06:00:00 via INTRAVENOUS

## 2014-10-22 MED ORDER — ROCURONIUM BROMIDE 100 MG/10ML IV SOLN
INTRAVENOUS | Status: DC | PRN
  Administered 2014-10-22: 30 mg via INTRAVENOUS
  Administered 2014-10-22 (×2): 10 mg via INTRAVENOUS

## 2014-10-22 MED ORDER — METOCLOPRAMIDE HCL 5 MG/ML IJ SOLN: 10.0000 mg | Freq: Once | INTRAMUSCULAR | Status: DC | PRN

## 2014-10-22 MED ORDER — KETOROLAC TROMETHAMINE 30 MG/ML IJ SOLN
INTRAMUSCULAR | Status: AC
  Filled 2014-10-22: qty 1

## 2014-10-22 MED ORDER — GLYCOPYRROLATE 0.2 MG/ML IJ SOLN
INTRAMUSCULAR | Status: AC
  Filled 2014-10-22: qty 3

## 2014-10-22 MED ORDER — GLYCOPYRROLATE 0.2 MG/ML IJ SOLN
INTRAMUSCULAR | Status: AC
Start: 2014-10-22 — End: 2014-10-22
  Filled 2014-10-22: qty 1

## 2014-10-22 MED ORDER — ONDANSETRON HCL 4 MG/2ML IJ SOLN
INTRAMUSCULAR | Status: DC | PRN
Start: 2014-10-22 — End: 2014-10-22
  Administered 2014-10-22: 4 mg via INTRAVENOUS

## 2014-10-22 MED ORDER — ACETAMINOPHEN 10 MG/ML IV SOLN
1000.0000 mg | Freq: Once | INTRAVENOUS | Status: AC
  Administered 2014-10-22: 1000 mg via INTRAVENOUS
  Filled 2014-10-22: qty 100

## 2014-10-22 MED ORDER — BUPIVACAINE HCL (PF) 0.25 % IJ SOLN
INTRAMUSCULAR | Status: DC | PRN
  Administered 2014-10-22: 8 mL

## 2014-10-22 MED ORDER — MIDAZOLAM HCL 2 MG/2ML IJ SOLN
INTRAMUSCULAR | Status: DC | PRN
  Administered 2014-10-22: 2 mg via INTRAVENOUS

## 2014-10-22 MED ORDER — FENTANYL CITRATE (PF) 100 MCG/2ML IJ SOLN
INTRAMUSCULAR | Status: DC | PRN
  Administered 2014-10-22 (×4): 50 ug via INTRAVENOUS
  Administered 2014-10-22 (×2): 25 ug via INTRAVENOUS

## 2014-10-22 MED ORDER — MIDAZOLAM HCL 2 MG/2ML IJ SOLN
INTRAMUSCULAR | Status: AC
  Filled 2014-10-22: qty 4

## 2014-10-22 MED ORDER — FENTANYL CITRATE (PF) 250 MCG/5ML IJ SOLN
INTRAMUSCULAR | Status: AC
  Filled 2014-10-22: qty 25

## 2014-10-22 MED ORDER — FENTANYL CITRATE (PF) 100 MCG/2ML IJ SOLN
INTRAMUSCULAR | Status: AC
  Filled 2014-10-22: qty 4

## 2014-10-22 MED ORDER — EPHEDRINE 5 MG/ML INJ
INTRAVENOUS | Status: AC
  Filled 2014-10-22: qty 10

## 2014-10-22 MED ORDER — DEXAMETHASONE SODIUM PHOSPHATE 10 MG/ML IJ SOLN
INTRAMUSCULAR | Status: DC | PRN
  Administered 2014-10-22: 8 mg via INTRAVENOUS

## 2014-10-22 MED ORDER — KETOROLAC TROMETHAMINE 30 MG/ML IJ SOLN
INTRAMUSCULAR | Status: DC | PRN
  Administered 2014-10-22: 30 mg via INTRAVENOUS

## 2014-10-22 MED ORDER — HYDROMORPHONE HCL 1 MG/ML IJ SOLN
0.2500 mg | INTRAMUSCULAR | Status: DC | PRN
Start: 2014-10-22 — End: 2014-10-22

## 2014-10-22 MED ORDER — DEXAMETHASONE SODIUM PHOSPHATE 10 MG/ML IJ SOLN
INTRAMUSCULAR | Status: AC
  Filled 2014-10-22: qty 1

## 2014-10-22 MED ORDER — HYDROCODONE-ACETAMINOPHEN 7.5-325 MG PO TABS: 1.0000 | ORAL_TABLET | Freq: Once | ORAL | Status: DC | PRN

## 2014-10-22 MED ORDER — ROCURONIUM BROMIDE 100 MG/10ML IV SOLN
INTRAVENOUS | Status: AC
  Filled 2014-10-22: qty 1

## 2014-10-22 MED ORDER — PROPOFOL 10 MG/ML IV BOLUS
INTRAVENOUS | Status: AC
  Filled 2014-10-22: qty 20

## 2014-10-22 MED ORDER — NEOSTIGMINE METHYLSULFATE 10 MG/10ML IV SOLN
INTRAVENOUS | Status: AC
  Filled 2014-10-22: qty 1

## 2014-10-22 MED ORDER — PROPOFOL 10 MG/ML IV BOLUS
INTRAVENOUS | Status: DC | PRN
  Administered 2014-10-22: 180 mg via INTRAVENOUS

## 2014-10-22 MED ORDER — BUPIVACAINE HCL (PF) 0.25 % IJ SOLN
INTRAMUSCULAR | Status: AC
  Filled 2014-10-22: qty 30

## 2014-10-22 MED ORDER — SCOPOLAMINE 1 MG/3DAYS TD PT72: 1.0000 | MEDICATED_PATCH | Freq: Once | TRANSDERMAL | Status: DC

## 2014-10-22 MED ORDER — LIDOCAINE HCL (CARDIAC) 20 MG/ML IV SOLN
INTRAVENOUS | Status: AC
  Filled 2014-10-22: qty 5

## 2014-10-22 MED ORDER — HYDROMORPHONE HCL 1 MG/ML IJ SOLN
INTRAMUSCULAR | Status: AC
  Filled 2014-10-22: qty 1

## 2014-10-22 MED ORDER — LACTATED RINGERS IV SOLN
INTRAVENOUS | Status: DC
  Administered 2014-10-22 (×3): via INTRAVENOUS

## 2014-10-22 MED ORDER — HYDROMORPHONE HCL 1 MG/ML IJ SOLN: 0.2500 mg | INTRAMUSCULAR | Status: DC | PRN

## 2014-10-22 MED ORDER — ONDANSETRON HCL 4 MG/2ML IJ SOLN
INTRAMUSCULAR | Status: AC
  Filled 2014-10-22: qty 2

## 2014-10-22 MED ORDER — SCOPOLAMINE 1 MG/3DAYS TD PT72
MEDICATED_PATCH | TRANSDERMAL | Status: AC
  Administered 2014-10-22: 1.5 mg via TRANSDERMAL
  Filled 2014-10-22: qty 1

## 2014-10-22 MED ORDER — SCOPOLAMINE 1 MG/3DAYS TD PT72
1.0000 | MEDICATED_PATCH | Freq: Once | TRANSDERMAL | Status: DC
  Administered 2014-10-22: 1.5 mg via TRANSDERMAL

## 2014-10-22 MED ORDER — LIDOCAINE HCL (CARDIAC) 20 MG/ML IV SOLN
INTRAVENOUS | Status: DC | PRN
  Administered 2014-10-22: 100 mg via INTRAVENOUS

## 2014-10-22 MED ORDER — LACTATED RINGERS IR SOLN
Status: DC | PRN
  Administered 2014-10-22: 3000 mL

## 2014-10-22 MED ORDER — OXYCODONE-ACETAMINOPHEN 5-325 MG PO TABS: 2.0000 | ORAL_TABLET | ORAL | Status: DC | PRN

## 2014-10-22 SURGICAL SUPPLY — 32 items
APL SKNCLS STERI-STRIP NONHPOA (GAUZE/BANDAGES/DRESSINGS)
BAG SPEC RTRVL LRG 6X4 10 (ENDOMECHANICALS)
BARRIER ADHS 3X4 INTERCEED (GAUZE/BANDAGES/DRESSINGS) IMPLANT
BENZOIN TINCTURE PRP APPL 2/3 (GAUZE/BANDAGES/DRESSINGS) IMPLANT
BRR ADH 4X3 ABS CNTRL BYND (GAUZE/BANDAGES/DRESSINGS)
CABLE HIGH FREQUENCY MONO STRZ (ELECTRODE) IMPLANT
CANISTER SUCT 3000ML (MISCELLANEOUS) ×2 IMPLANT
CLOTH BEACON ORANGE TIMEOUT ST (SAFETY) ×2 IMPLANT
DRSG COVADERM PLUS 2X2 (GAUZE/BANDAGES/DRESSINGS) ×4 IMPLANT
DRSG OPSITE POSTOP 3X4 (GAUZE/BANDAGES/DRESSINGS) ×1 IMPLANT
FORCEPS CUTTING 33CM 5MM (CUTTING FORCEPS) ×1 IMPLANT
GLOVE BIO SURGEON STRL SZ 6.5 (GLOVE) ×4 IMPLANT
GOWN STRL REUS W/TWL LRG LVL3 (GOWN DISPOSABLE) ×4 IMPLANT
LIQUID BAND (GAUZE/BANDAGES/DRESSINGS) ×1 IMPLANT
NS IRRIG 1000ML POUR BTL (IV SOLUTION) ×2 IMPLANT
PACK LAPAROSCOPY BASIN (CUSTOM PROCEDURE TRAY) ×2 IMPLANT
PAD POSITIONING PINK XL (MISCELLANEOUS) ×2 IMPLANT
POUCH SPECIMEN RETRIEVAL 10MM (ENDOMECHANICALS) IMPLANT
PROTECTOR NERVE ULNAR (MISCELLANEOUS) ×4 IMPLANT
SCISSORS LAP 5X35 DISP (ENDOMECHANICALS) IMPLANT
SET IRRIG TUBING LAPAROSCOPIC (IRRIGATION / IRRIGATOR) ×2 IMPLANT
SOLUTION ELECTROLUBE (MISCELLANEOUS) IMPLANT
STRIP CLOSURE SKIN 1/4X3 (GAUZE/BANDAGES/DRESSINGS) IMPLANT
SUT VIC AB 2-0 UR6 27 (SUTURE) ×2 IMPLANT
SUT VIC AB 4-0 PS2 18 (SUTURE) ×2 IMPLANT
SYSTEM CARTER THOMASON II (TROCAR) ×1 IMPLANT
TOWEL OR 17X24 6PK STRL BLUE (TOWEL DISPOSABLE) ×4 IMPLANT
TRAY FOLEY CATH SILVER 14FR (SET/KITS/TRAYS/PACK) ×2 IMPLANT
TROCAR 5M 150ML BLDLS (TROCAR) ×2 IMPLANT
TROCAR XCEL DIL TIP R 11M (ENDOMECHANICALS) IMPLANT
WARMER LAPAROSCOPE (MISCELLANEOUS) ×2 IMPLANT
WATER STERILE IRR 1000ML POUR (IV SOLUTION) ×2 IMPLANT

## 2014-10-22 NOTE — Brief Op Note (Signed)
10/22/2014  8:53 AM  PATIENT:  Cheryl Moore  54 y.o. female  PRE-OPERATIVE DIAGNOSIS:  history of breast cancer  POST-OPERATIVE DIAGNOSIS:  history of breast cancer  PROCEDURE:  Procedure(s) with comments: LAPAROSCOPIC SALPINGO OOPHORECTOMY (Bilateral) - BMI 41.82  SURGEON:  Surgeon(s) and Role:    * Brook E Yisroel Ramming, MD - Primary    * Salvadore Dom, MD - Assisting  PHYSICIAN ASSISTANT: NA  ASSISTANTS: Salvadore Dom, MD   ANESTHESIA:   local, general and IV Tylenol  EBL:  Total I/O In: 1000 [I.V.:1000] Out: 3 [Urine:50; Blood:5]  BLOOD ADMINISTERED:none  DRAINS: none   LOCAL MEDICATIONS USED:  MARCAINE     SPECIMEN:  Source of Specimen:  bilateral tubes and ovaries  DISPOSITION OF SPECIMEN:  PATHOLOGY  COUNTS:  YES  TOURNIQUET:  * No tourniquets in log *  DICTATION: .Other Dictation: Dictation Number    PLAN OF CARE: Discharge to home after PACU  PATIENT DISPOSITION:  PACU - hemodynamically stable.   Delay start of Pharmacological VTE agent (>24hrs) due to surgical blood loss or risk of bleeding: not applicable

## 2014-10-22 NOTE — Telephone Encounter (Signed)
Left message to confirm appointment change due to MD leaving early. Moved from 10/05 to 10/14 per MD. Mailed calendar.

## 2014-10-22 NOTE — Progress Notes (Signed)
Update to HIstory and Physical  No marked change in status since office preop visit.  Patient examined.  OK to proceed with surgery.  I did discuss laparotomy if the surgery could not be completed laparoscopically, and patient desires this if needed.

## 2014-10-22 NOTE — Anesthesia Procedure Notes (Signed)
Procedure Name: Intubation Date/Time: 10/22/2014 7:35 AM Performed by: Georgeanne Nim Pre-anesthesia Checklist: Patient identified, Emergency Drugs available, Suction available, Patient being monitored and Timeout performed Patient Re-evaluated:Patient Re-evaluated prior to inductionOxygen Delivery Method: Circle system utilized Preoxygenation: Pre-oxygenation with 100% oxygen Intubation Type: IV induction Ventilation: Mask ventilation without difficulty Laryngoscope size: lopro 3. Tube size: 7.0 mm Number of attempts: 1 Airway Equipment and Method: Patient positioned with wedge pillow and Video-laryngoscopy Placement Confirmation: ETT inserted through vocal cords under direct vision,  positive ETCO2,  CO2 detector and breath sounds checked- equal and bilateral Secured at: 21 cm Tube secured with: Tape Dental Injury: Teeth and Oropharynx as per pre-operative assessment  Difficulty Due To: Difficulty was anticipated

## 2014-10-22 NOTE — Anesthesia Postprocedure Evaluation (Signed)
  Anesthesia Post-op Note  Patient: Cheryl Moore  Procedure(s) Performed: Procedure(s) with comments: LAPAROSCOPIC SALPINGO OOPHORECTOMY (Bilateral) - BMI 41.82  Patient Location: PACU  Anesthesia Type:General  Level of Consciousness: awake, alert  and oriented  Airway and Oxygen Therapy: Patient Spontanous Breathing  Post-op Pain: mild  Post-op Assessment: Post-op Vital signs reviewed, Patient's Cardiovascular Status Stable, Respiratory Function Stable, Patent Airway, No signs of Nausea or vomiting and Pain level controlled              Post-op Vital Signs: Reviewed and stable  Last Vitals:  Filed Vitals:   10/22/14 0915  BP: 97/50  Pulse: 70  Temp:   Resp: 16    Complications: No apparent anesthesia complications

## 2014-10-22 NOTE — Discharge Instructions (Addendum)
Cheryl Moore,   Your surgery went very well!  Everything looked normal inside, and I was able to remove the tubes and ovaries without difficulty.  Please review with your oncologist your medications such as the Ibrance and your new breast cancer treatment regimen now that you have had your ovaries removed.   Do not take both the tramadol and Percocet.   Do not mix Xanax and narcotics together.  They have an additive effect.  Take either the Percocet or the Xanax, but not both.   Thank you!  [name], MD   Bilateral Salpingo-Oophorectomy, Care After Refer to this sheet in the next few weeks. These instructions provide you with information on caring for yourself after your procedure. Your health care provider may also give you more specific instructions. Your treatment has been planned according to current medical practices, but problems sometimes occur. Call your health care provider if you have any problems or questions after your procedure. WHAT TO EXPECT AFTER THE PROCEDURE After your procedure, it is typical to have the following:   Abdominal pain that can be controlled with medicine.  Vaginal spotting.  Constipation.  Menopausal symptoms such as hot flashes, vaginal dryness, and mood swings. HOME CARE INSTRUCTIONS   Get plenty of rest and sleep.  Only take over-the-counter or prescription medicines as directed by your health care provider. Do not take aspirin. It can cause bleeding.  Keep incision areas clean and dry. Remove or change bandages (dressings) only as directed by your health care provider.  Take showers instead of baths for a few weeks as directed by your health care provider.  Limit exercise and activities as directed by your health care provider. Do not lift anything heavier than 5 pounds (2.3 kg) until your health care provider approves.  Do not drive until your health care provider approves.  Follow your health care provider's advice regarding diet. You may  be able to resume your usual diet right away.  Drink enough fluids to keep your urine clear or pale yellow.  Do not douche, use tampons, or have sexual intercourse for 6 weeks after the procedure.  Do not drink alcohol until your health care provider says it is okay.  Take your temperature twice a day and write it down.  If you become constipated, you may:  Ask your health care provider about taking a mild laxative.  Add more fruit and bran to your diet.  Drink more fluids.  Follow up with your health care provider as directed. SEEK MEDICAL CARE IF:   You have swelling, redness, or increasing pain in the incision area.  You see pus coming from the incision area.  You notice a bad smell coming from the wound or dressing.  You have pain, redness, or swelling where the IV access tube was placed.  Your incision is breaking open (the edges are not staying together).  You feel dizzy or feel like fainting.  You develop pain or bleeding when you urinate.  You develop diarrhea.  You develop nausea and vomiting.  You develop abnormal vaginal discharge.  You develop a rash.  You have pain that is not controlled with medicine. SEEK IMMEDIATE MEDICAL CARE IF:   You develop a fever.  You develop abdominal pain.  You have chest pain.  You develop shortness of breath.  You pass out.  You develop pain, swelling, or redness in your leg.  You develop heavy vaginal bleeding with or without blood clots. Document Released: 01/12/2005 Document Revised: 09/14/2012  Document Reviewed: 07/06/2012 [facility] Patient Information 2015 [place], [place]. This information is not intended to replace advice given to you by your health care provider. Make sure you discuss any questions you have with your health care provider.  Diagnostic Laparoscopy Laparoscopy is a surgical procedure. It is used to diagnose and treat diseases inside the belly (abdomen). It is usually a brief, common, and  relatively simple procedure. The laparoscopeis a thin, lighted, pencil-sized instrument. It is like a telescope. It is inserted into your abdomen through a small cut (incision). Your caregiver can look at the organs inside your body through this instrument. He or she can see if there is anything abnormal. Laparoscopy can be done either in a hospital or outpatient clinic. You may be given a mild sedative to help you relax before the procedure. Once in the operating room, you will be given a drug to make you sleep (general anesthesia). Laparoscopy usually lasts less than 1 hour. After the procedure, you will be monitored in a recovery area until you are stable and doing well. Once you are home, it will take 2 to 3 days to fully recover. RISKS AND COMPLICATIONS  Laparoscopy has relatively few risks. Your caregiver will discuss the risks with you before the procedure. Some problems that can occur include:  Infection.  Bleeding.  Damage to other organs.  Anesthetic side effects. PROCEDURE Once you receive anesthesia, your surgeon inflates the abdomen with a harmless gas (carbon dioxide). This makes the organs easier to see. The laparoscope is inserted into the abdomen through a small incision. This allows your surgeon to see into the abdomen. Other small instruments are also inserted into the abdomen through other small openings. Many surgeons attach a video camera to the laparoscope to enlarge the view. During a diagnostic laparoscopy, the surgeon may be looking for inflammation, infection, or cancer. Your surgeon may take tissue samples(biopsies). The samples are sent to a specialist in looking at cells and tissue samples (pathologist). The pathologist examines them under a microscope. Biopsies can help to diagnose or confirm a disease. AFTER THE PROCEDURE   The gas is released from inside the abdomen.  The incisions are closed with stitches (sutures). Because these incisions are small (usually less  than 1/2 inch), there is usually minimal discomfort after the procedure. There may be some mild discomfort in the throat. This is from the tube placed in the throat while you were sleeping. You may have some mild abdominal discomfort. There may also be discomfort from the instrument placement incisions in the abdomen.  The recovery time is shortened as long as there are no complications.  You will rest in a recovery room until stable and doing well. As long as there are no complications, you may be allowed to go home. FINDING OUT THE RESULTS OF YOUR TEST Not all test results are available during your visit. If your test results are not back during the visit, make an appointment with your caregiver to find out the results. Do not assume everything is normal if you have not heard from your caregiver or the medical facility. It is important for you to follow up on all of your test results. HOME CARE INSTRUCTIONS   Take all medicines as directed.  Only take over-the-counter or prescription medicines for pain, discomfort, or fever as directed by your caregiver.  Resume daily activities as directed.  Showers are preferred over baths.  You may resume sexual activities in 1 week or as directed.  Do not  drive while taking narcotics. SEEK MEDICAL CARE IF:   There is increasing abdominal pain.  There is new pain in the shoulders (shoulder strap areas).  You feel lightheaded or faint.  You have the chills.  You or your child has an oral temperature above 102 F (38.9 C).  There is pus-like (purulent) drainage from any of the wounds.  You are unable to pass gas or have a bowel movement.  You feel sick to your stomach (nauseous) or throw up (vomit). MAKE SURE YOU:   Understand these instructions.  Will watch your condition.  Will get help right away if you are not doing well or get worse. Document Released: 04/20/2000 Document Revised: 05/09/2012 Document Reviewed:  01/12/2007 [facility] Patient Information 2015 [place], [place]. This information is not intended to replace advice given to you by your health care provider. Make sure you discuss any questions you have with your health care provider.    DISCHARGE INSTRUCTIONS: Laparoscopy  The following instructions have been prepared to help you care for yourself upon your return home today.  Wound care:  Do not get the incision wet for the first 24 hours. The incision should be kept clean and dry.  The Band-Aids or dressings may be removed the day after surgery.  Should the incision become sore, red, and swollen after the first week, check with your doctor.  Personal hygiene:  Shower the day after your procedure.  Activity and limitations:  Do NOT drive or operate any equipment today.  Do NOT lift anything more than 15 pounds for 2-3 weeks after surgery.  Do NOT rest in bed all day.  Walking is encouraged. Walk each day, starting slowly with 5-minute walks 3 or 4 times a day. Slowly increase the length of your walks.  Walk up and down stairs slowly.  Do NOT do strenuous activities, such as golfing, playing tennis, bowling, running, biking, weight lifting, gardening, mowing, or vacuuming for 2-4 weeks. Ask your doctor when it is okay to start.  Diet: Eat a light meal as desired this evening. You may resume your usual diet tomorrow.  Return to work: This is dependent on the type of work you do. For the most part you can return to a desk job within a week of surgery. If you are more active at work, please discuss this with your doctor.  What to expect after your surgery: You may have a slight burning sensation when you urinate on the first day. You may have a very small amount of blood in the urine. Expect to have a small amount of vaginal discharge/light bleeding for 1-2 weeks. It is not unusual to have abdominal soreness and bruising for up to 2 weeks. You may be tired and need more rest for  about 1 week. You may experience shoulder pain for 24-72 hours. Lying flat in bed may relieve it.  NO IBUPROFEN PRODUCTS (MOTRIN, ADVIL) OR ALEVE UNTIL AFTER 2:30PM TODAY.   Call your doctor for any of the following:  Develop a fever of 100.4 or greater  Inability to urinate 6 hours after discharge from hospital  Severe pain not relieved by pain medications  Persistent of heavy bleeding at incision site  Redness or swelling around incision site after a week  Increasing nausea or vomiting  Patient Signature________________________________________ Nurse Signature_________________________________________

## 2014-10-22 NOTE — Transfer of Care (Signed)
Immediate Anesthesia Transfer of Care Note  Patient: Cheryl Moore  Procedure(s) Performed: Procedure(s) with comments: LAPAROSCOPIC SALPINGO OOPHORECTOMY (Bilateral) - BMI 41.82  Patient Location: PACU  Anesthesia Type:General  Level of Consciousness: awake, alert , oriented and patient cooperative  Airway & Oxygen Therapy: Patient Spontanous Breathing and Patient connected to nasal cannula oxygen  Post-op Assessment: Report given to RN and Post -op Vital signs reviewed and stable  Post vital signs: Reviewed and stable  Last Vitals:  Filed Vitals:   10/22/14 0600  Pulse: 67  Temp: 36.3 C  Resp: 20    Complications: No apparent anesthesia complications

## 2014-10-23 ENCOUNTER — Encounter (HOSPITAL_COMMUNITY): Payer: Self-pay | Admitting: Obstetrics and Gynecology

## 2014-10-23 NOTE — Op Note (Signed)
Cheryl Moore, Cheryl Moore NO.:  [id number]  MEDICAL RECORD NO.:  [id number]  LOCATION:  [facility]                          FACILITY:  [facility]  PHYSICIAN:  Lenard Galloway, M.D.   DATE OF BIRTH:  Jul 25, 1960  DATE OF PROCEDURE:  10/22/2014 DATE OF DISCHARGE:  10/22/2014                              OPERATIVE REPORT   PREOPERATIVE DIAGNOSIS:  History of breast cancer.  POSTOPERATIVE DIAGNOSIS:  History of breast cancer.  PROCEDURE:  Laparoscopic bilateral salpingo-oophorectomy.  SURGEON:  Lenard Galloway, M.D.  ASSISTANT:  Sumner Boast, MD.  ANESTHESIA:  General endotracheal, local with 0.25% Marcaine and IV Tylenol.  IV FLUIDS:  1000 mL Ringer's lactate.  EBL:  5 mL.  URINE OUTPUT:  50 mL.  COMPLICATIONS:  None.  INDICATIONS FOR THE PROCEDURE:  The patient is a 54 year old, gravida 1, para 1, Caucasian female with a history of breast cancer stage IV, estrogen receptor positive, who presents for request of bilateral oophorectomy.  The patient's breast cancer is currently controlled, and she is status post bilateral mastectomy.  A recent PET scan has documented no evidence of active disease.  The patient is BRCA negative and up on the recommendation of her oncologist, the patient is now requesting to proceed with removal of her ovaries.  Risks, benefits, and alternatives have been reviewed with the patient who wishes to proceed.  FINDINGS:  Laparoscopy demonstrated normal bilateral tubes and ovaries. There appeared to be a right paratubal cyst which was small and collapsed.  The uterus appeared to be unremarkable.  There were some small adhesions between the lower uterine segment, and the bladder consistent with a prior cesarean section.  There was no evidence of any endometriosis.  In the upper abdomen, the liver edge was visualized and unremarkable.  The appendix was not visualized.  There was no evidence of any papillations or excrescences throughout the abdomen  nor the pelvis.  There was no evidence of any ascites fluid.  SPECIMENS:  The bilateral tubes and ovaries were sent to Pathology together.  PROCEDURE IN DETAIL:  The patient was reidentified in the preoperative hold area.  The patient received clindamycin 900 mg IV.  She received TED hose and PAS stockings for DVT prophylaxis.  In the operating room, the patient was placed in the dorsal lithotomy position with Allen stirrups.  General endotracheal anesthesia was induced.  The patient's arms were placed at her sides along with appropriate arm supports.  The patient's abdomen, vagina, and external genital area was sterilely prepped and the patient was draped.  A speculum was placed inside the vagina, and a single-tooth tenaculum was placed on the anterior cervical lip.  A Hulka tenaculum was used to replace this.  The remaining vaginal instruments were removed, and a Foley catheter was placed inside the bladder and left to gravity drainage throughout the procedure.  Attention was then turned to the abdomen where the infraumbilical region was injected locally with 0.25% Marcaine.  An Allis clamp was used to elevate the skin below the umbilicus, and a scalpel was used to create a 1-cm incision.  A reusable trocar was placed directly inside the peritoneal cavity, and the laparoscope confirmed  proper placement.  The rubber gas kit was noted to leak air and there was not a replacement for this, and so this trocar was removed and then a reusable 10-11 trocar was placed insidethe peritoneal cavity without difficulty and used throughout the remaining portion of the procedure.  The laparoscope was replaced at this time to confirm placement of the trocar again, and a CO2 pneumoperitoneum was achieved.  The patient was placed in Trendelenburg position.  The abdomen and pelvis were inspected.  5-mm right and left lower quadrant incisions were created after the skin was injected locally with 0.25%  Marcaine.  The 5-mm trocars were placed under direct visualization of the laparoscope.  Continued inspection of the pelvic and abdominal organs was performed.  The ureters were identified bilaterally.  The gyrus instrument was used to perform the bilateral salpingo- oophorectomy.  The procedure began on the patient's left-hand side where the left infundibulopelvic ligament was identified, triply cauterized and then bisected with the gyrus instrument.  Continued dissection through the mesosalpinx was performed with the gyrus instrument for cautery and cutting.  The proximal fallopian tube was then cauterized and bisected with the gyrus.  The utero-ovarian ligament was similarly triply cauterized and then bisected with the gyrus.  The left tube and ovary were placed inside the pelvis.  Additional cautery with the gyrus instrument was used at various points along the mesosalpinx and at the infundibulopelvic ligament on this patient's side.  There was very little bleeding noted.  Hemostasis was then excellent.  The ureter was well out of the way of the surgical field.  The same procedure that was performed on the patient's left-hand side was then repeated on the patient's right-hand side for the salpingo- oophorectomy.  Again, the ureter was identified in advance.  The specimen was then placed in the pelvis.  A 5-mm laparoscope was then placed through the right lower quadrant incision, and an EndoCatch was placed through the umbilical trocar. Both tubes and ovaries were placed inside the EndoCatch bag, and these were drawn up and out of the umbilical incision without difficulty. They were sent to Pathology.  The 10/11 trocar was then replaced inside the umbilical incision.  The surgical sites were inspected and found to be hemostatic.  The CO2 gas was let down and they remained hemostatic.  A 5-mm laparoscope was used in the right lower quadrant incision and the Carter-Thompson fascial  closure device was placed through the umbilical incision and then the 10/11 trocar was removed.  0 Vicryl suture was placed as a single suture using the Carter-Thompson device.  The device was removed and the suture was tied, and there was excellent closure of the fascia.  The left lower quadrant trocar was removed under visualization of the laparoscope after a large majority of the CO2 gas was removed.  The remainder of the gas was then removed through the right lower quadrant trocar as manual breaths were given.  The right lower quadrant trocar was removed with a 5-mm laparoscope in place.  Hemostasis appeared to be excellent with removal of all the trocars.  All skin incisions were closed with subcuticular sutures of 4-0 Vicryl. The skin was cleansed of the skin prep and LiquiBand was placed over the incisions.  A honeycomb dressing was placed over the umbilical incision.  The Foley catheter and the Hulka tenaculum were removed from the cervix.  The patient was awakened, extubated, and escorted to the recovery room in stable condition.  There were no complications  to the procedure.  All needle, instrument, and sponge counts were correct.     Lenard Galloway, M.D.     BES/MEDQ  D:  10/22/2014  T:  10/23/2014  Job:  [id number]

## 2014-10-24 ENCOUNTER — Other Ambulatory Visit: Payer: Self-pay | Admitting: *Deleted

## 2014-10-25 ENCOUNTER — Telehealth: Payer: Self-pay

## 2014-10-25 NOTE — Telephone Encounter (Signed)
Left message to call Kaitlyn at [phone]. 

## 2014-10-25 NOTE — Telephone Encounter (Signed)
-----   Message from Nunzio Cobbs, MD sent at 10/23/2014  3:49 PM EDT ----- Please give the patient the good news that her surgical pathology specimen was negative for malignancy.  She had a laparoscopic bilateral salpingo-oophorectomy on 9/26. I hope she is doing well!

## 2014-10-30 NOTE — Telephone Encounter (Signed)
Spoke with patient. Advised of message and results as seen below from [place]. Patient is agreeable and verbalizes understanding.  Routing to provider for final review. Patient agreeable to disposition. Will close encounter.

## 2014-10-31 ENCOUNTER — Ambulatory Visit: Payer: BLUE CROSS/BLUE SHIELD | Admitting: Oncology

## 2014-11-05 ENCOUNTER — Encounter: Payer: Self-pay | Admitting: Obstetrics and Gynecology

## 2014-11-05 ENCOUNTER — Ambulatory Visit (INDEPENDENT_AMBULATORY_CARE_PROVIDER_SITE_OTHER): Payer: BLUE CROSS/BLUE SHIELD | Admitting: Obstetrics and Gynecology

## 2014-11-05 VITALS — BP 124/82 | HR 100 | Ht 63.0 in | Wt 230.2 lb

## 2014-11-05 DIAGNOSIS — R7309 Other abnormal glucose: Secondary | ICD-10-CM

## 2014-11-05 HISTORY — DX: Other abnormal glucose: R73.09

## 2014-11-05 LAB — HEMOGLOBIN A1C
Hgb A1c MFr Bld: 6.2 % — ABNORMAL HIGH (ref <5.7)
Mean Plasma Glucose: 131 mg/dL — ABNORMAL HIGH (ref <117)

## 2014-11-05 NOTE — Progress Notes (Signed)
Patient ID: Cheryl Moore, female   DOB: 10/31/1960, 54 y.o.   MRN: [id number] GYNECOLOGY  VISIT   HPI: 54 y.o.   Married  Caucasian  female   G1P1001 with No LMP recorded. Patient is not currently having periods (Reason: Chemotherapy).   here for 2 week follow up to [facility].  Pathology - benign tubes and ovaries.    Feeling tired.  No pain medication use.  Good bowel function and voiding well.   Will see Dr. Jana Hakim this week.   Does not have a PCP but would see Dr. Kristie Cowman if she needs to see a PCP.  GYNECOLOGIC HISTORY: No LMP recorded. Patient is not currently having periods (Reason: Chemotherapy). Contraception: None Menopausal hormone therapy: none Last mammogram: Rt.mastectomy 2014--Bil.MRI 09-22-12 Density B/The previously noted areas of abnormal enhancement in the right breast and abnormal right axillary lymph nodes are smaller compared to previous MRI of April 2014 suggesting respond to neoadjuvant therapy as described. Last pap smear: 05-02-12 Neg        OB History    Gravida Para Term Preterm AB TAB SAB Ectopic Multiple Living   1 1 1       1          Patient Active Problem List   Diagnosis Date Noted  . Perioral dermatitis 09/05/2014  . Shingles 09/05/2014  . Nausea without vomiting 03/09/2014  . Breast cancer of upper-outer quadrant of right female breast ([place]) 10/06/2013  . History of colonic polyps 09/19/2013  . Dyspnea 09/21/2012  . Obesity 05/02/2012    Past Medical History  Diagnosis Date  . Breast cancer ([place])     right  . Anxiety   . Hot flashes   . History of radiation therapy 11/28/12-03/06/13    right breast/64.4Gy/ right hip=37.5Gy   . Obesity   . Personal history of colonic polyps - ssp and adenomas 09/19/2013  . Neuromuscular disorder ([place])     Neuropathy due to chemo      Past Surgical History  Procedure Laterality Date  . Cesarean section  2005  . Breast surgery Right     breast bx  . Breast  biopsy Right 05/23/2012    Procedure: SKIN PUNCH BIOPSY RIGHT BREAST;  Surgeon: [name], MD;  Location: [place] ;  Service: General;  Laterality: Right;  . Portacath placement Left 05/23/2012    Procedure: INSERTION PORT-A-CATH;  Surgeon: [name], MD;  Location: [place];  Service: General;  Laterality: Left;  . Total mastectomy Left 10/26/2012    Dr Donne Hazel  . Simple mastectomy with axillary sentinel node biopsy Left 10/26/2012    Procedure: TOTAL MASTECTOMY;  Surgeon: [name], MD;  Location: [place];  Service: General;  Laterality: Left;  Marland Kitchen Mastectomy modified radical Right 10/26/2012    Procedure: MASTECTOMY MODIFIED RADICAL;  Surgeon: [name], MD;  Location: [place];  Service: General;  Laterality: Right;  . Radiology with anesthesia N/A 08/24/2013    Procedure: MRI;  Surgeon: Medication Radiologist, MD;  Location: [facility];  Service: Radiology;  Laterality: N/A;  . Port-a-cath removal Left 04/10/2014    Procedure: REMOVAL PORT-A-CATH;  Surgeon: [name], MD;  Location: [place] [facility];  Service: General;  Laterality: Left;  . Laparoscopic salpingo oopherectomy Bilateral 10/22/2014    Procedure: LAPAROSCOPIC SALPINGO OOPHORECTOMY;  Surgeon: Nunzio Cobbs, MD;  Location: [facility];  Service: Gynecology;  Laterality: Bilateral;  BMI 41.82    Current Outpatient Prescriptions  Medication Sig Dispense  Refill  . Ascorbic Acid (VITAMIN C PO) Take 1 tablet by mouth daily.     Marland Kitchen CALCIUM PO Take 1 tablet by mouth daily.    Marland Kitchen denosumab (XGEVA) 120 MG/1.7ML SOLN injection Inject 120 mg into the skin every 30 (thirty) days.    Marland Kitchen gabapentin (NEURONTIN) 100 MG capsule Take 1 capsule (100 mg total) by mouth daily. 90 capsule 2  . gabapentin (NEURONTIN) 300 MG capsule Take 1 capsule (300 mg total) by mouth at bedtime. 90 capsule 4  . goserelin (ZOLADEX) 10.8 MG injection Inject 10.8 mg into the skin every 30 (thirty) days.     Marland Kitchen ibuprofen (ADVIL,MOTRIN) 800 MG tablet Take 1  tablet (800 mg total) by mouth every 8 (eight) hours as needed. 30 tablet 0  . letrozole (FEMARA) 2.5 MG tablet TAKE 1 TABLET BY MOUTH DAILY 30 tablet 2  . Multiple Vitamin (MULTIVITAMIN) tablet Take 1 tablet by mouth daily.    . prochlorperazine (COMPAZINE) 10 MG tablet TAKE 1 TABLET (10 MG TOTAL) BY MOUTH EVERY 6 (SIX) HOURS AS NEEDED FOR NAUSEA OR VOMITING. 30 tablet 2  . psyllium (METAMUCIL) 58.6 % powder Take 1 packet by mouth daily.    Marland Kitchen scopolamine (TRANSDERM-SCOP) 1 MG/3DAYS Place 1 patch (1.5 mg total) onto the skin once. 10 patch 12  . valACYclovir (VALTREX) 1000 MG tablet Take 1 tablet (1,000 mg total) by mouth 3 (three) times daily. (Patient not taking: Reported on 10/08/2014) 21 tablet 0  . venlafaxine (EFFEXOR) 75 MG tablet TAKE ONE TABLET BY MOUTH ONCE DAILY 30 tablet 6   No current facility-administered medications for this visit.     ALLERGIES: Other and Penicillins  Family History  Problem Relation Age of Onset  . Adopted: Yes    Social History   Social History  . Marital Status: Married    Spouse Name: N/A  . Number of Children: 1  . Years of Education: N/A   Occupational History  . Housewife    Social History Main Topics  . Smoking status: Former Smoker -- 0.25 packs/day for 5 years    Types: Cigarettes    Quit date: 05/21/2002  . Smokeless tobacco: Never Used  . Alcohol Use: No  . Drug Use: No  . Sexual Activity:    Partners: Male    Birth Control/ Protection: None   Other Topics Concern  . 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  PHYSICAL EXAMINATION:    BP 124/82 mmHg  Pulse 100  Ht 5\' 3"  (1.6 m)  Wt 230 lb 3.2 oz (104.418 kg)  BMI 40.79 kg/m2    General appearance: alert, cooperative and appears stated age Abdomen: incisions intact, soft, non-tender; bowel sounds normal; no masses,  no organomegaly     Pelvic: External genitalia:  no  lesions              Urethra:  normal appearing urethra with no masses, tenderness or lesions              Bartholins and Skenes: normal                 Vagina: normal appearing vagina with normal color and discharge, no lesions              Cervix: no lesions  Bimanual Exam:  Uterus:  normal size, contour, position, consistency, mobility, non-tender              Adnexa: normal adnexa and no mass, fullness, tenderness               Chaperone was present for exam.  ASSESSMENT  Status post laparoscopic bilateral salpingo-oophorectomy.  Doing well post op.  Elevated glucose on periop labs.  PLAN  Counseled regarding return to normal activity in 2 weeks.  Will check HgbA1C now.  To follow up with oncology this week.  Call for any vaginal bleeding.  Follow up in this office for routine annual exam.    An After Visit Summary was printed and given to the patient.

## 2014-11-09 ENCOUNTER — Ambulatory Visit (HOSPITAL_BASED_OUTPATIENT_CLINIC_OR_DEPARTMENT_OTHER): Payer: BLUE CROSS/BLUE SHIELD | Admitting: Oncology

## 2014-11-09 VITALS — BP 137/90 | HR 99 | Temp 98.5°F | Resp 19 | Ht 63.0 in | Wt 230.8 lb

## 2014-11-09 DIAGNOSIS — C50411 Malignant neoplasm of upper-outer quadrant of right female breast: Secondary | ICD-10-CM

## 2014-11-09 DIAGNOSIS — C7951 Secondary malignant neoplasm of bone: Secondary | ICD-10-CM | POA: Diagnosis not present
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
## 2014-11-10 ENCOUNTER — Encounter: Payer: Self-pay | Admitting: Obstetrics and Gynecology

## 2014-11-12 ENCOUNTER — Other Ambulatory Visit: Payer: Self-pay | Admitting: *Deleted

## 2014-11-12 DIAGNOSIS — C50411 Malignant neoplasm of upper-outer quadrant of right female breast: Secondary | ICD-10-CM

## 2014-11-12 MED ORDER — PALBOCICLIB 125 MG PO CAPS
125.0000 mg | ORAL_CAPSULE | Freq: Every day | ORAL | Status: DC
Start: 2014-11-12 — End: 2014-11-15

## 2014-11-15 ENCOUNTER — Other Ambulatory Visit: Payer: Self-pay

## 2014-11-15 ENCOUNTER — Other Ambulatory Visit (HOSPITAL_BASED_OUTPATIENT_CLINIC_OR_DEPARTMENT_OTHER): Payer: BLUE CROSS/BLUE SHIELD

## 2014-11-15 ENCOUNTER — Ambulatory Visit (HOSPITAL_BASED_OUTPATIENT_CLINIC_OR_DEPARTMENT_OTHER): Payer: BLUE CROSS/BLUE SHIELD

## 2014-11-15 VITALS — BP 115/76 | HR 89 | Temp 98.5°F

## 2014-11-15 DIAGNOSIS — C50411 Malignant neoplasm of upper-outer quadrant of right female breast: Secondary | ICD-10-CM

## 2014-11-15 DIAGNOSIS — C7951 Secondary malignant neoplasm of bone: Secondary | ICD-10-CM

## 2014-11-15 DIAGNOSIS — C50419 Malignant neoplasm of upper-outer quadrant of unspecified female breast: Secondary | ICD-10-CM

## 2014-11-15 LAB — COMPREHENSIVE METABOLIC PANEL (CC13)
ALBUMIN: 4.1 g/dL (ref 3.5–5.0)
ALT: 47 U/L (ref 0–55)
ANION GAP: 9 meq/L (ref 3–11)
AST: 28 U/L (ref 5–34)
Alkaline Phosphatase: 60 U/L (ref 40–150)
BILIRUBIN TOTAL: 0.32 mg/dL (ref 0.20–1.20)
BUN: 19.4 mg/dL (ref 7.0–26.0)
CALCIUM: 9.4 mg/dL (ref 8.4–10.4)
CO2: 19 meq/L — AB (ref 22–29)
CREATININE: 1.2 mg/dL — AB (ref 0.6–1.1)
Chloride: 111 mEq/L — ABNORMAL HIGH (ref 98–109)
EGFR: 54 mL/min/{1.73_m2} — ABNORMAL LOW (ref >90)
Glucose: 131 mg/dl (ref 70–140)
Potassium: 4.3 mEq/L (ref 3.5–5.1)
Sodium: 139 mEq/L (ref 136–145)
TOTAL PROTEIN: 7.2 g/dL (ref 6.4–8.3)

## 2014-11-15 LAB — CBC WITH DIFFERENTIAL/PLATELET
BASO%: 1.3 % (ref 0.0–2.0)
Basophils Absolute: 0.1 10*3/uL (ref 0.0–0.1)
EOS%: 1.9 % (ref 0.0–7.0)
Eosinophils Absolute: 0.1 10*3/uL (ref 0.0–0.5)
HEMATOCRIT: 39.9 % (ref 34.8–46.6)
HEMOGLOBIN: 14 g/dL (ref 11.6–15.9)
LYMPH#: 1.8 10*3/uL (ref 0.9–3.3)
LYMPH%: 40.2 % (ref 14.0–49.7)
MCH: 36.2 pg — ABNORMAL HIGH (ref 25.1–34.0)
MCHC: 35.1 g/dL (ref 31.5–36.0)
MCV: 103.3 fL — ABNORMAL HIGH (ref 79.5–101.0)
MONO#: 0.3 10*3/uL (ref 0.1–0.9)
MONO%: 6 % (ref 0.0–14.0)
NEUT%: 50.6 % (ref 38.4–76.8)
NEUTROS ABS: 2.3 10*3/uL (ref 1.5–6.5)
PLATELETS: 265 10*3/uL (ref 145–400)
RBC: 3.86 10*6/uL (ref 3.70–5.45)
RDW: 13.2 % (ref 11.2–14.5)
WBC: 4.4 10*3/uL (ref 3.9–10.3)

## 2014-11-15 MED ORDER — DENOSUMAB 120 MG/1.7ML ~~LOC~~ SOLN
120.0000 mg | Freq: Once | SUBCUTANEOUS | Status: AC
  Administered 2014-11-15: 120 mg via SUBCUTANEOUS
  Filled 2014-11-15: qty 1.7

## 2014-11-15 MED ORDER — PALBOCICLIB 125 MG PO CAPS: 125.0000 mg | ORAL_CAPSULE | Freq: Every day | ORAL | Status: DC

## 2014-12-10 ENCOUNTER — Ambulatory Visit (HOSPITAL_COMMUNITY): Payer: BLUE CROSS/BLUE SHIELD

## 2014-12-10 ENCOUNTER — Other Ambulatory Visit: Payer: Self-pay | Admitting: Oncology

## 2014-12-10 DIAGNOSIS — C50019 Malignant neoplasm of nipple and areola, unspecified female breast: Secondary | ICD-10-CM

## 2014-12-12 ENCOUNTER — Telehealth: Payer: Self-pay | Admitting: Oncology

## 2014-12-12 ENCOUNTER — Other Ambulatory Visit: Payer: Self-pay | Admitting: Oncology

## 2014-12-12 NOTE — Telephone Encounter (Signed)
Urgent pof noted for 3:30 on 11/23 but her scan runs from 3:00-4:00,i have sent dr Doris Cheadle an urgent staff message to advise

## 2014-12-13 ENCOUNTER — Other Ambulatory Visit: Payer: BLUE CROSS/BLUE SHIELD

## 2014-12-13 ENCOUNTER — Ambulatory Visit: Payer: BLUE CROSS/BLUE SHIELD | Admitting: Oncology

## 2014-12-13 ENCOUNTER — Ambulatory Visit: Payer: BLUE CROSS/BLUE SHIELD

## 2014-12-14 ENCOUNTER — Ambulatory Visit (HOSPITAL_COMMUNITY)
Admission: RE | Admit: 2014-12-14 | Discharge: 2014-12-14 | Disposition: A | Discharge status: Home / Self Care | Payer: BLUE CROSS/BLUE SHIELD | Source: Ambulatory Visit | Attending: Oncology | Admitting: Oncology

## 2014-12-14 ENCOUNTER — Encounter (HOSPITAL_COMMUNITY): Payer: Self-pay

## 2014-12-14 DIAGNOSIS — C50019 Malignant neoplasm of nipple and areola, unspecified female breast: Secondary | ICD-10-CM | POA: Diagnosis present

## 2014-12-14 DIAGNOSIS — M899 Disorder of bone, unspecified: Secondary | ICD-10-CM | POA: Insufficient documentation

## 2014-12-14 MED ORDER — IOHEXOL 300 MG/ML  SOLN
75.0000 mL | Freq: Once | INTRAMUSCULAR | Status: AC | PRN
  Administered 2014-12-14: 75 mL via INTRAVENOUS

## 2014-12-15 ENCOUNTER — Other Ambulatory Visit: Payer: Self-pay | Admitting: Oncology

## 2014-12-17 ENCOUNTER — Telehealth: Payer: Self-pay | Admitting: Oncology

## 2014-12-17 NOTE — Telephone Encounter (Signed)
Called and left a message with 11/28

## 2014-12-18 ENCOUNTER — Encounter (HOSPITAL_COMMUNITY)
Admission: RE | Admit: 2014-12-18 | Discharge: 2014-12-18 | Disposition: A | Discharge status: Home / Self Care | Payer: BLUE CROSS/BLUE SHIELD | Source: Ambulatory Visit | Attending: Nurse Practitioner | Admitting: Nurse Practitioner

## 2014-12-18 DIAGNOSIS — C50411 Malignant neoplasm of upper-outer quadrant of right female breast: Secondary | ICD-10-CM | POA: Diagnosis not present

## 2014-12-18 DIAGNOSIS — C50019 Malignant neoplasm of nipple and areola, unspecified female breast: Secondary | ICD-10-CM | POA: Insufficient documentation

## 2014-12-18 LAB — GLUCOSE, CAPILLARY: GLUCOSE-CAPILLARY: 102 mg/dL — AB (ref 65–99)

## 2014-12-18 MED ORDER — FLUDEOXYGLUCOSE F - 18 (FDG) INJECTION
11.6000 | Freq: Once | INTRAVENOUS | Status: DC | PRN
  Administered 2014-12-18: 11.6 via INTRAVENOUS
  Filled 2014-12-18: qty 11.6

## 2014-12-19 ENCOUNTER — Other Ambulatory Visit: Payer: Self-pay | Admitting: Oncology

## 2014-12-19 ENCOUNTER — Encounter (HOSPITAL_COMMUNITY)
Admission: RE | Admit: 2014-12-19 | Discharge: 2014-12-19 | Disposition: A | Discharge status: Home / Self Care | Payer: BLUE CROSS/BLUE SHIELD | Source: Ambulatory Visit | Attending: Oncology | Admitting: Oncology

## 2014-12-19 DIAGNOSIS — C50411 Malignant neoplasm of upper-outer quadrant of right female breast: Secondary | ICD-10-CM | POA: Diagnosis not present

## 2014-12-19 DIAGNOSIS — C50019 Malignant neoplasm of nipple and areola, unspecified female breast: Secondary | ICD-10-CM

## 2014-12-19 MED ORDER — TECHNETIUM TC 99M MEDRONATE IV KIT
27.1000 | PACK | Freq: Once | INTRAVENOUS | Status: AC | PRN
  Administered 2014-12-19: 27.1 via INTRAVENOUS

## 2014-12-24 ENCOUNTER — Ambulatory Visit (HOSPITAL_BASED_OUTPATIENT_CLINIC_OR_DEPARTMENT_OTHER): Payer: BLUE CROSS/BLUE SHIELD | Admitting: Oncology

## 2014-12-24 ENCOUNTER — Telehealth: Payer: Self-pay | Admitting: Oncology

## 2014-12-24 ENCOUNTER — Ambulatory Visit (HOSPITAL_BASED_OUTPATIENT_CLINIC_OR_DEPARTMENT_OTHER): Payer: BLUE CROSS/BLUE SHIELD

## 2014-12-24 VITALS — BP 124/65 | HR 68 | Temp 98.1°F | Resp 18 | Ht 63.0 in | Wt 228.6 lb

## 2014-12-24 DIAGNOSIS — C50411 Malignant neoplasm of upper-outer quadrant of right female breast: Secondary | ICD-10-CM

## 2014-12-24 DIAGNOSIS — C7951 Secondary malignant neoplasm of bone: Secondary | ICD-10-CM

## 2014-12-24 DIAGNOSIS — C50419 Malignant neoplasm of upper-outer quadrant of unspecified female breast: Secondary | ICD-10-CM

## 2014-12-24 LAB — CBC WITH DIFFERENTIAL/PLATELET
BASO%: 0.8 % (ref 0.0–2.0)
Basophils Absolute: 0 10*3/uL (ref 0.0–0.1)
EOS%: 1.5 % (ref 0.0–7.0)
Eosinophils Absolute: 0 10*3/uL (ref 0.0–0.5)
HCT: 39.3 % (ref 34.8–46.6)
HGB: 13.7 g/dL (ref 11.6–15.9)
LYMPH#: 1.2 10*3/uL (ref 0.9–3.3)
LYMPH%: 46.9 % (ref 14.0–49.7)
MCH: 35.3 pg — ABNORMAL HIGH (ref 25.1–34.0)
MCHC: 34.9 g/dL (ref 31.5–36.0)
MCV: 101.3 fL — ABNORMAL HIGH (ref 79.5–101.0)
MONO#: 0.2 10*3/uL (ref 0.1–0.9)
MONO%: 6.2 % (ref 0.0–14.0)
NEUT%: 44.6 % (ref 38.4–76.8)
NEUTROS ABS: 1.2 10*3/uL — AB (ref 1.5–6.5)
PLATELETS: 236 10*3/uL (ref 145–400)
RBC: 3.88 10*6/uL (ref 3.70–5.45)
RDW: 13.5 % (ref 11.2–14.5)
WBC: 2.6 10*3/uL — AB (ref 3.9–10.3)

## 2014-12-24 LAB — COMPREHENSIVE METABOLIC PANEL (CC13)
ALBUMIN: 3.9 g/dL (ref 3.5–5.0)
ALK PHOS: 53 U/L (ref 40–150)
ALT: 43 U/L (ref 0–55)
AST: 25 U/L (ref 5–34)
Anion Gap: 10 mEq/L (ref 3–11)
BILIRUBIN TOTAL: 0.35 mg/dL (ref 0.20–1.20)
BUN: 15.9 mg/dL (ref 7.0–26.0)
CALCIUM: 9.5 mg/dL (ref 8.4–10.4)
CO2: 22 mEq/L (ref 22–29)
Chloride: 108 mEq/L (ref 98–109)
Creatinine: 1.1 mg/dL (ref 0.6–1.1)
EGFR: 58 mL/min/{1.73_m2} — AB (ref >90)
Glucose: 105 mg/dl (ref 70–140)
POTASSIUM: 4.4 meq/L (ref 3.5–5.1)
Sodium: 141 mEq/L (ref 136–145)
TOTAL PROTEIN: 7.3 g/dL (ref 6.4–8.3)

## 2014-12-24 NOTE — Telephone Encounter (Signed)
Gave patient avs report and appointments for December thru June.
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
## 2014-12-26 ENCOUNTER — Other Ambulatory Visit: Payer: Self-pay | Admitting: Oncology

## 2015-01-01 ENCOUNTER — Other Ambulatory Visit: Payer: Self-pay

## 2015-01-01 DIAGNOSIS — C50411 Malignant neoplasm of upper-outer quadrant of right female breast: Secondary | ICD-10-CM

## 2015-01-01 MED ORDER — PALBOCICLIB 125 MG PO CAPS: 125.0000 mg | ORAL_CAPSULE | Freq: Every day | ORAL | Status: DC

## 2015-01-01 NOTE — Telephone Encounter (Signed)
Received call from [facility] @ [facility] for clarification of Ibrance.   Spoke with [facility] and clarified that Ibrance 125 mg daily for 21 days ,  Rest 7 days - as per Dr. Virgie Dad notes on  12/24/14. [facility]   Phone   [phone]  Ext   [phone].

## 2015-01-10 ENCOUNTER — Ambulatory Visit (HOSPITAL_BASED_OUTPATIENT_CLINIC_OR_DEPARTMENT_OTHER): Payer: BLUE CROSS/BLUE SHIELD

## 2015-01-10 ENCOUNTER — Other Ambulatory Visit (HOSPITAL_BASED_OUTPATIENT_CLINIC_OR_DEPARTMENT_OTHER): Payer: BLUE CROSS/BLUE SHIELD

## 2015-01-10 VITALS — BP 149/69 | HR 60 | Temp 98.2°F

## 2015-01-10 DIAGNOSIS — C7951 Secondary malignant neoplasm of bone: Secondary | ICD-10-CM

## 2015-01-10 DIAGNOSIS — C50411 Malignant neoplasm of upper-outer quadrant of right female breast: Secondary | ICD-10-CM

## 2015-01-10 DIAGNOSIS — C50419 Malignant neoplasm of upper-outer quadrant of unspecified female breast: Secondary | ICD-10-CM

## 2015-01-10 LAB — COMPREHENSIVE METABOLIC PANEL
ALT: 37 U/L (ref 0–55)
ANION GAP: 10 meq/L (ref 3–11)
AST: 23 U/L (ref 5–34)
Albumin: 4.1 g/dL (ref 3.5–5.0)
Alkaline Phosphatase: 56 U/L (ref 40–150)
BUN: 12.6 mg/dL (ref 7.0–26.0)
CHLORIDE: 109 meq/L (ref 98–109)
CO2: 21 meq/L — AB (ref 22–29)
Calcium: 10 mg/dL (ref 8.4–10.4)
Creatinine: 1 mg/dL (ref 0.6–1.1)
EGFR: 62 mL/min/{1.73_m2} — AB (ref >90)
GLUCOSE: 112 mg/dL (ref 70–140)
Potassium: 4.6 mEq/L (ref 3.5–5.1)
SODIUM: 140 meq/L (ref 136–145)
TOTAL PROTEIN: 7.3 g/dL (ref 6.4–8.3)
Total Bilirubin: 0.3 mg/dL (ref 0.20–1.20)

## 2015-01-10 LAB — CBC WITH DIFFERENTIAL/PLATELET
BASO%: 0.8 % (ref 0.0–2.0)
Basophils Absolute: 0 10*3/uL (ref 0.0–0.1)
EOS ABS: 0 10*3/uL (ref 0.0–0.5)
EOS%: 0.3 % (ref 0.0–7.0)
HCT: 37.9 % (ref 34.8–46.6)
HGB: 13.3 g/dL (ref 11.6–15.9)
LYMPH%: 40.4 % (ref 14.0–49.7)
MCH: 35.2 pg — ABNORMAL HIGH (ref 25.1–34.0)
MCHC: 35.1 g/dL (ref 31.5–36.0)
MCV: 100.3 fL (ref 79.5–101.0)
MONO#: 0.4 10*3/uL (ref 0.1–0.9)
MONO%: 11.2 % (ref 0.0–14.0)
NEUT%: 47.3 % (ref 38.4–76.8)
NEUTROS ABS: 1.7 10*3/uL (ref 1.5–6.5)
PLATELETS: 226 10*3/uL (ref 145–400)
RBC: 3.78 10*6/uL (ref 3.70–5.45)
RDW: 13.8 % (ref 11.2–14.5)
WBC: 3.6 10*3/uL — AB (ref 3.9–10.3)
lymph#: 1.4 10*3/uL (ref 0.9–3.3)

## 2015-01-10 MED ORDER — DENOSUMAB 120 MG/1.7ML ~~LOC~~ SOLN
120.0000 mg | Freq: Once | SUBCUTANEOUS | Status: AC
  Administered 2015-01-10: 120 mg via SUBCUTANEOUS
  Filled 2015-01-10: qty 1.7

## 2015-01-25 ENCOUNTER — Other Ambulatory Visit: Payer: Self-pay | Admitting: Oncology

## 2015-01-30 ENCOUNTER — Other Ambulatory Visit: Payer: Self-pay | Admitting: *Deleted

## 2015-01-31 ENCOUNTER — Other Ambulatory Visit: Payer: Self-pay | Admitting: Oncology

## 2015-02-05 ENCOUNTER — Other Ambulatory Visit: Payer: Self-pay | Admitting: *Deleted

## 2015-02-05 DIAGNOSIS — C50411 Malignant neoplasm of upper-outer quadrant of right female breast: Secondary | ICD-10-CM

## 2015-02-05 MED ORDER — PALBOCICLIB 125 MG PO CAPS: 125.0000 mg | ORAL_CAPSULE | Freq: Every day | ORAL | Status: DC

## 2015-02-05 NOTE — Telephone Encounter (Signed)
TC from patient requesting refill on her IBrance.  Last filled 01/01/15

## 2015-02-07 ENCOUNTER — Other Ambulatory Visit (HOSPITAL_BASED_OUTPATIENT_CLINIC_OR_DEPARTMENT_OTHER): Payer: BLUE CROSS/BLUE SHIELD

## 2015-02-07 DIAGNOSIS — C50411 Malignant neoplasm of upper-outer quadrant of right female breast: Secondary | ICD-10-CM | POA: Diagnosis not present

## 2015-02-07 LAB — CBC WITH DIFFERENTIAL/PLATELET
BASO%: 1.3 % (ref 0.0–2.0)
Basophils Absolute: 0.1 10*3/uL (ref 0.0–0.1)
EOS%: 0.4 % (ref 0.0–7.0)
Eosinophils Absolute: 0 10*3/uL (ref 0.0–0.5)
HCT: 37 % (ref 34.8–46.6)
HGB: 13.1 g/dL (ref 11.6–15.9)
LYMPH#: 1.7 10*3/uL (ref 0.9–3.3)
LYMPH%: 38.9 % (ref 14.0–49.7)
MCH: 36 pg — ABNORMAL HIGH (ref 25.1–34.0)
MCHC: 35.4 g/dL (ref 31.5–36.0)
MCV: 101.8 fL — ABNORMAL HIGH (ref 79.5–101.0)
MONO#: 0.4 10*3/uL (ref 0.1–0.9)
MONO%: 9.2 % (ref 0.0–14.0)
NEUT%: 50.2 % (ref 38.4–76.8)
NEUTROS ABS: 2.1 10*3/uL (ref 1.5–6.5)
PLATELETS: 239 10*3/uL (ref 145–400)
RBC: 3.63 10*6/uL — AB (ref 3.70–5.45)
RDW: 15 % — ABNORMAL HIGH (ref 11.2–14.5)
WBC: 4.3 10*3/uL (ref 3.9–10.3)

## 2015-02-11 NOTE — Telephone Encounter (Signed)
Ibrance prescription faxed to [facility] on 02/05/15.

## 2015-03-01 ENCOUNTER — Other Ambulatory Visit: Payer: Self-pay | Admitting: *Deleted

## 2015-03-01 DIAGNOSIS — C50411 Malignant neoplasm of upper-outer quadrant of right female breast: Secondary | ICD-10-CM

## 2015-03-01 MED ORDER — PALBOCICLIB 125 MG PO CAPS: 125.0000 mg | ORAL_CAPSULE | Freq: Every day | ORAL | Status: DC

## 2015-03-01 MED ORDER — VENLAFAXINE HCL 75 MG PO TABS: 75.0000 mg | ORAL_TABLET | Freq: Every day | ORAL | Status: DC

## 2015-03-01 MED ORDER — LETROZOLE 2.5 MG PO TABS: 2.5000 mg | ORAL_TABLET | Freq: Every day | ORAL | Status: DC

## 2015-03-05 ENCOUNTER — Other Ambulatory Visit: Payer: Self-pay

## 2015-03-05 DIAGNOSIS — C50411 Malignant neoplasm of upper-outer quadrant of right female breast: Secondary | ICD-10-CM

## 2015-03-05 MED ORDER — LETROZOLE 2.5 MG PO TABS: 2.5000 mg | ORAL_TABLET | Freq: Every day | ORAL | Status: DC

## 2015-03-05 MED ORDER — VENLAFAXINE HCL 75 MG PO TABS: 75.0000 mg | ORAL_TABLET | Freq: Every day | ORAL | Status: DC

## 2015-03-07 ENCOUNTER — Other Ambulatory Visit: Payer: Self-pay | Admitting: *Deleted

## 2015-03-07 ENCOUNTER — Ambulatory Visit: Payer: BLUE CROSS/BLUE SHIELD

## 2015-03-07 ENCOUNTER — Encounter: Payer: Self-pay | Admitting: Nurse Practitioner

## 2015-03-07 ENCOUNTER — Ambulatory Visit (HOSPITAL_COMMUNITY)
Admission: RE | Admit: 2015-03-07 | Discharge: 2015-03-07 | Disposition: A | Discharge status: Home / Self Care | Payer: BLUE CROSS/BLUE SHIELD | Source: Ambulatory Visit | Attending: Oncology | Admitting: Oncology

## 2015-03-07 ENCOUNTER — Other Ambulatory Visit (HOSPITAL_BASED_OUTPATIENT_CLINIC_OR_DEPARTMENT_OTHER): Payer: BLUE CROSS/BLUE SHIELD

## 2015-03-07 ENCOUNTER — Telehealth: Payer: Self-pay | Admitting: Oncology

## 2015-03-07 ENCOUNTER — Ambulatory Visit (HOSPITAL_BASED_OUTPATIENT_CLINIC_OR_DEPARTMENT_OTHER): Payer: BLUE CROSS/BLUE SHIELD | Admitting: Nurse Practitioner

## 2015-03-07 VITALS — BP 130/56 | HR 74 | Temp 97.9°F | Resp 18 | Ht 63.0 in | Wt 228.3 lb

## 2015-03-07 DIAGNOSIS — C50411 Malignant neoplasm of upper-outer quadrant of right female breast: Secondary | ICD-10-CM | POA: Insufficient documentation

## 2015-03-07 DIAGNOSIS — C50211 Malignant neoplasm of upper-inner quadrant of right female breast: Secondary | ICD-10-CM

## 2015-03-07 DIAGNOSIS — C792 Secondary malignant neoplasm of skin: Secondary | ICD-10-CM | POA: Diagnosis not present

## 2015-03-07 DIAGNOSIS — R911 Solitary pulmonary nodule: Secondary | ICD-10-CM

## 2015-03-07 DIAGNOSIS — Z9889 Other specified postprocedural states: Secondary | ICD-10-CM | POA: Diagnosis not present

## 2015-03-07 DIAGNOSIS — C773 Secondary and unspecified malignant neoplasm of axilla and upper limb lymph nodes: Secondary | ICD-10-CM

## 2015-03-07 DIAGNOSIS — C7951 Secondary malignant neoplasm of bone: Secondary | ICD-10-CM

## 2015-03-07 LAB — CBC WITH DIFFERENTIAL/PLATELET
BASO%: 1.1 % (ref 0.0–2.0)
Basophils Absolute: 0 10*3/uL (ref 0.0–0.1)
EOS%: 0.4 % (ref 0.0–7.0)
Eosinophils Absolute: 0 10*3/uL (ref 0.0–0.5)
HCT: 37.5 % (ref 34.8–46.6)
HGB: 13.2 g/dL (ref 11.6–15.9)
LYMPH%: 39.3 % (ref 14.0–49.7)
MCH: 35.9 pg — ABNORMAL HIGH (ref 25.1–34.0)
MCHC: 35.2 g/dL (ref 31.5–36.0)
MCV: 102.1 fL — ABNORMAL HIGH (ref 79.5–101.0)
MONO#: 0.2 10*3/uL (ref 0.1–0.9)
MONO%: 5.7 % (ref 0.0–14.0)
NEUT#: 2 10*3/uL (ref 1.5–6.5)
NEUT%: 53.5 % (ref 38.4–76.8)
Platelets: 267 10*3/uL (ref 145–400)
RBC: 3.67 10*6/uL — AB (ref 3.70–5.45)
RDW: 15.1 % — ABNORMAL HIGH (ref 11.2–14.5)
WBC: 3.8 10*3/uL — AB (ref 3.9–10.3)
lymph#: 1.5 10*3/uL (ref 0.9–3.3)

## 2015-03-07 MED ORDER — GABAPENTIN 100 MG PO CAPS: 100.0000 mg | ORAL_CAPSULE | Freq: Every day | ORAL | Status: DC

## 2015-03-07 MED ORDER — VALACYCLOVIR HCL 500 MG PO TABS: 500.0000 mg | ORAL_TABLET | Freq: Two times a day (BID) | ORAL | Status: AC

## 2015-03-07 MED ORDER — ALPRAZOLAM 0.25 MG PO TABS: 0.2500 mg | ORAL_TABLET | Freq: Every evening | ORAL | Status: DC | PRN

## 2015-03-07 MED ORDER — PROCHLORPERAZINE MALEATE 10 MG PO TABS: 10.0000 mg | ORAL_TABLET | Freq: Four times a day (QID) | ORAL | Status: DC | PRN

## 2015-03-07 MED ORDER — TRAMADOL HCL 50 MG PO TABS: 50.0000 mg | ORAL_TABLET | Freq: Four times a day (QID) | ORAL | Status: DC | PRN
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
## 2015-03-07 NOTE — Telephone Encounter (Signed)
Gave patient avs report and appointments for February thru June. Patient will return for lab/inj tomorrow was not able to return to lab today or have inj.

## 2015-03-08 ENCOUNTER — Other Ambulatory Visit (HOSPITAL_BASED_OUTPATIENT_CLINIC_OR_DEPARTMENT_OTHER): Payer: BLUE CROSS/BLUE SHIELD

## 2015-03-08 ENCOUNTER — Ambulatory Visit (HOSPITAL_BASED_OUTPATIENT_CLINIC_OR_DEPARTMENT_OTHER): Payer: BLUE CROSS/BLUE SHIELD

## 2015-03-08 VITALS — BP 147/64 | HR 67 | Temp 98.0°F

## 2015-03-08 DIAGNOSIS — C50211 Malignant neoplasm of upper-inner quadrant of right female breast: Secondary | ICD-10-CM

## 2015-03-08 DIAGNOSIS — C7951 Secondary malignant neoplasm of bone: Secondary | ICD-10-CM

## 2015-03-08 DIAGNOSIS — C50411 Malignant neoplasm of upper-outer quadrant of right female breast: Secondary | ICD-10-CM

## 2015-03-08 LAB — CBC WITH DIFFERENTIAL/PLATELET
BASO%: 1 % (ref 0.0–2.0)
Basophils Absolute: 0 10*3/uL (ref 0.0–0.1)
EOS ABS: 0 10*3/uL (ref 0.0–0.5)
EOS%: 0.3 % (ref 0.0–7.0)
HCT: 38.9 % (ref 34.8–46.6)
HEMOGLOBIN: 13.5 g/dL (ref 11.6–15.9)
LYMPH%: 46.3 % (ref 14.0–49.7)
MCH: 35.4 pg — ABNORMAL HIGH (ref 25.1–34.0)
MCHC: 34.6 g/dL (ref 31.5–36.0)
MCV: 102.2 fL — AB (ref 79.5–101.0)
MONO#: 0.3 10*3/uL (ref 0.1–0.9)
MONO%: 8.1 % (ref 0.0–14.0)
NEUT%: 44.3 % (ref 38.4–76.8)
NEUTROS ABS: 1.5 10*3/uL (ref 1.5–6.5)
Platelets: 265 10*3/uL (ref 145–400)
RBC: 3.81 10*6/uL (ref 3.70–5.45)
RDW: 14.7 % — AB (ref 11.2–14.5)
WBC: 3.4 10*3/uL — AB (ref 3.9–10.3)
lymph#: 1.6 10*3/uL (ref 0.9–3.3)

## 2015-03-08 LAB — COMPREHENSIVE METABOLIC PANEL
ALK PHOS: 66 U/L (ref 40–150)
ALT: 32 U/L (ref 0–55)
AST: 21 U/L (ref 5–34)
Albumin: 4.2 g/dL (ref 3.5–5.0)
Anion Gap: 15 mEq/L — ABNORMAL HIGH (ref 3–11)
BILIRUBIN TOTAL: 0.31 mg/dL (ref 0.20–1.20)
BUN: 19.4 mg/dL (ref 7.0–26.0)
CO2: 18 mEq/L — ABNORMAL LOW (ref 22–29)
Calcium: 10.3 mg/dL (ref 8.4–10.4)
Chloride: 107 mEq/L (ref 98–109)
Creatinine: 1 mg/dL (ref 0.6–1.1)
EGFR: 66 mL/min/{1.73_m2} — AB (ref >90)
Glucose: 105 mg/dl (ref 70–140)
Potassium: 4.1 mEq/L (ref 3.5–5.1)
Sodium: 139 mEq/L (ref 136–145)
Total Protein: 7.8 g/dL (ref 6.4–8.3)

## 2015-03-08 MED ORDER — DENOSUMAB 120 MG/1.7ML ~~LOC~~ SOLN
120.0000 mg | Freq: Once | SUBCUTANEOUS | Status: AC
  Administered 2015-03-08: 120 mg via SUBCUTANEOUS
  Filled 2015-03-08: qty 1.7

## 2015-03-29 ENCOUNTER — Other Ambulatory Visit: Payer: Self-pay | Admitting: *Deleted

## 2015-04-03 ENCOUNTER — Other Ambulatory Visit: Payer: Self-pay | Admitting: *Deleted

## 2015-04-03 DIAGNOSIS — C50411 Malignant neoplasm of upper-outer quadrant of right female breast: Secondary | ICD-10-CM

## 2015-04-03 MED ORDER — PALBOCICLIB 125 MG PO CAPS: 125.0000 mg | ORAL_CAPSULE | Freq: Every day | ORAL | Status: DC

## 2015-04-04 ENCOUNTER — Other Ambulatory Visit: Payer: BLUE CROSS/BLUE SHIELD

## 2015-04-04 ENCOUNTER — Telehealth: Payer: Self-pay

## 2015-04-04 NOTE — Telephone Encounter (Signed)
Writer confirmed [employer] schedule 21 days on, 7 days off.

## 2015-04-04 NOTE — Telephone Encounter (Signed)
LMOVM - Pt missed lab appt today.  Instructed pt to NOT start next cycle of ibrance until labs are drawn.  Pt to call clinic to reschedule lab draw.

## 2015-04-11 ENCOUNTER — Telehealth: Payer: Self-pay | Admitting: *Deleted

## 2015-04-11 NOTE — Telephone Encounter (Signed)
"  I forgot and missed a lab appointment I need to reschedule.  I also have a medication problem.  My Cheryl Moore will no longer be paid by Cheryl Moore after April 27, 2015.  Received a letter this week dated 03-25-2015 reads more information is needed in writing from your doctor before we continue to pay for Ibrance.  'Quantity limitation.  You do not meet medical necessity with out further information'.  I am on my week off and due to resume tomorrow.  I will contact Cheryl Moore.  I believe they have a ten dollar co-pay but I want my insuarnce to pay for what they should." Advised she also drop letter off to Cheryl Moore for prior authorization process to begin.   Call transferred to scheduler ext. [phone].

## 2015-04-15 ENCOUNTER — Telehealth: Payer: Self-pay

## 2015-04-15 ENCOUNTER — Telehealth: Payer: Self-pay | Admitting: Oncology

## 2015-04-15 ENCOUNTER — Other Ambulatory Visit (HOSPITAL_BASED_OUTPATIENT_CLINIC_OR_DEPARTMENT_OTHER): Payer: BLUE CROSS/BLUE SHIELD

## 2015-04-15 DIAGNOSIS — C50211 Malignant neoplasm of upper-inner quadrant of right female breast: Secondary | ICD-10-CM

## 2015-04-15 DIAGNOSIS — C50411 Malignant neoplasm of upper-outer quadrant of right female breast: Secondary | ICD-10-CM

## 2015-04-15 LAB — CBC WITH DIFFERENTIAL/PLATELET
BASO%: 1.2 % (ref 0.0–2.0)
Basophils Absolute: 0 10*3/uL (ref 0.0–0.1)
EOS ABS: 0 10*3/uL (ref 0.0–0.5)
EOS%: 0.6 % (ref 0.0–7.0)
HCT: 38.1 % (ref 34.8–46.6)
HEMOGLOBIN: 13.2 g/dL (ref 11.6–15.9)
LYMPH#: 1.4 10*3/uL (ref 0.9–3.3)
LYMPH%: 42.3 % (ref 14.0–49.7)
MCH: 35.6 pg — ABNORMAL HIGH (ref 25.1–34.0)
MCHC: 34.6 g/dL (ref 31.5–36.0)
MCV: 102.7 fL — AB (ref 79.5–101.0)
MONO#: 0.4 10*3/uL (ref 0.1–0.9)
MONO%: 12.9 % (ref 0.0–14.0)
NEUT%: 43 % (ref 38.4–76.8)
NEUTROS ABS: 1.4 10*3/uL — AB (ref 1.5–6.5)
Platelets: 221 10*3/uL (ref 145–400)
RBC: 3.71 10*6/uL (ref 3.70–5.45)
RDW: 14.6 % — AB (ref 11.2–14.5)
WBC: 3.3 10*3/uL — AB (ref 3.9–10.3)

## 2015-04-15 LAB — COMPREHENSIVE METABOLIC PANEL
ALBUMIN: 4 g/dL (ref 3.5–5.0)
ALK PHOS: 53 U/L (ref 40–150)
ALT: 36 U/L (ref 0–55)
AST: 24 U/L (ref 5–34)
Anion Gap: 10 mEq/L (ref 3–11)
BILIRUBIN TOTAL: 0.34 mg/dL (ref 0.20–1.20)
BUN: 13.9 mg/dL (ref 7.0–26.0)
CO2: 21 mEq/L — ABNORMAL LOW (ref 22–29)
Calcium: 9.2 mg/dL (ref 8.4–10.4)
Chloride: 109 mEq/L (ref 98–109)
Creatinine: 1 mg/dL (ref 0.6–1.1)
EGFR: 67 mL/min/{1.73_m2} — ABNORMAL LOW (ref >90)
GLUCOSE: 112 mg/dL (ref 70–140)
Potassium: 4.3 mEq/L (ref 3.5–5.1)
SODIUM: 140 meq/L (ref 136–145)
TOTAL PROTEIN: 7.2 g/dL (ref 6.4–8.3)

## 2015-04-15 NOTE — Telephone Encounter (Signed)
Patient was called with results, [place] - 1.4    Patient instructed not to start Ibrance due to [place] per Gentry Fitz, NP.  Patient to have repeat lab Monday 04/22/15 and possibly have a dose reduction to 100 mg of Ibrance.

## 2015-04-15 NOTE — Telephone Encounter (Signed)
Left vm to inform patient of added lab appt 3/27 at noon

## 2015-04-22 ENCOUNTER — Other Ambulatory Visit (HOSPITAL_BASED_OUTPATIENT_CLINIC_OR_DEPARTMENT_OTHER): Payer: BLUE CROSS/BLUE SHIELD

## 2015-04-22 DIAGNOSIS — C50211 Malignant neoplasm of upper-inner quadrant of right female breast: Secondary | ICD-10-CM | POA: Diagnosis not present

## 2015-04-22 DIAGNOSIS — C50411 Malignant neoplasm of upper-outer quadrant of right female breast: Secondary | ICD-10-CM

## 2015-04-22 LAB — COMPREHENSIVE METABOLIC PANEL
ALBUMIN: 4 g/dL (ref 3.5–5.0)
ALK PHOS: 52 U/L (ref 40–150)
ALT: 43 U/L (ref 0–55)
AST: 27 U/L (ref 5–34)
Anion Gap: 9 mEq/L (ref 3–11)
BUN: 12.1 mg/dL (ref 7.0–26.0)
CALCIUM: 9.4 mg/dL (ref 8.4–10.4)
CHLORIDE: 111 meq/L — AB (ref 98–109)
CO2: 19 mEq/L — ABNORMAL LOW (ref 22–29)
Creatinine: 0.9 mg/dL (ref 0.6–1.1)
EGFR: 69 mL/min/{1.73_m2} — AB (ref >90)
GLUCOSE: 94 mg/dL (ref 70–140)
POTASSIUM: 4.1 meq/L (ref 3.5–5.1)
SODIUM: 139 meq/L (ref 136–145)
Total Bilirubin: 0.34 mg/dL (ref 0.20–1.20)
Total Protein: 7.2 g/dL (ref 6.4–8.3)

## 2015-04-22 LAB — CBC WITH DIFFERENTIAL/PLATELET
BASO%: 1.7 % (ref 0.0–2.0)
BASOS ABS: 0.1 10*3/uL (ref 0.0–0.1)
EOS ABS: 0 10*3/uL (ref 0.0–0.5)
EOS%: 0.7 % (ref 0.0–7.0)
HCT: 38.3 % (ref 34.8–46.6)
HGB: 13.1 g/dL (ref 11.6–15.9)
LYMPH%: 38 % (ref 14.0–49.7)
MCH: 35.8 pg — AB (ref 25.1–34.0)
MCHC: 34.2 g/dL (ref 31.5–36.0)
MCV: 104.8 fL — AB (ref 79.5–101.0)
MONO#: 0.5 10*3/uL (ref 0.1–0.9)
MONO%: 10.2 % (ref 0.0–14.0)
NEUT#: 2.2 10*3/uL (ref 1.5–6.5)
NEUT%: 49.4 % (ref 38.4–76.8)
Platelets: 277 10*3/uL (ref 145–400)
RBC: 3.66 10*6/uL — ABNORMAL LOW (ref 3.70–5.45)
RDW: 14.5 % (ref 11.2–14.5)
WBC: 4.4 10*3/uL (ref 3.9–10.3)
lymph#: 1.7 10*3/uL (ref 0.9–3.3)

## 2015-04-24 ENCOUNTER — Encounter: Payer: Self-pay | Admitting: Genetic Counselor

## 2015-04-24 DIAGNOSIS — Z1379 Encounter for other screening for genetic and chromosomal anomalies: Secondary | ICD-10-CM | POA: Insufficient documentation

## 2015-04-25 ENCOUNTER — Telehealth: Payer: Self-pay | Admitting: *Deleted

## 2015-04-26 ENCOUNTER — Other Ambulatory Visit: Payer: Self-pay | Admitting: *Deleted

## 2015-04-26 NOTE — Telephone Encounter (Signed)
This RN spoke with pt per lab results post review with HB/NP including pt has on hand per recent fill Ibrance 125 mg tablets.  Pt is to restart above the Ibrance at the 125mg  dose.  POF sent for lab appointment for late April to monitor labs.

## 2015-05-02 ENCOUNTER — Other Ambulatory Visit (HOSPITAL_BASED_OUTPATIENT_CLINIC_OR_DEPARTMENT_OTHER): Payer: BLUE CROSS/BLUE SHIELD

## 2015-05-02 ENCOUNTER — Ambulatory Visit (HOSPITAL_BASED_OUTPATIENT_CLINIC_OR_DEPARTMENT_OTHER): Payer: BLUE CROSS/BLUE SHIELD

## 2015-05-02 VITALS — BP 131/79 | HR 65 | Temp 97.7°F

## 2015-05-02 DIAGNOSIS — C7951 Secondary malignant neoplasm of bone: Secondary | ICD-10-CM | POA: Diagnosis not present

## 2015-05-02 DIAGNOSIS — C50211 Malignant neoplasm of upper-inner quadrant of right female breast: Secondary | ICD-10-CM

## 2015-05-02 DIAGNOSIS — C50411 Malignant neoplasm of upper-outer quadrant of right female breast: Secondary | ICD-10-CM

## 2015-05-02 LAB — CBC WITH DIFFERENTIAL/PLATELET
BASO%: 0.9 % (ref 0.0–2.0)
Basophils Absolute: 0 10*3/uL (ref 0.0–0.1)
EOS%: 1.2 % (ref 0.0–7.0)
Eosinophils Absolute: 0.1 10*3/uL (ref 0.0–0.5)
HEMATOCRIT: 38.8 % (ref 34.8–46.6)
HEMOGLOBIN: 13.5 g/dL (ref 11.6–15.9)
LYMPH%: 33.1 % (ref 14.0–49.7)
MCH: 36 pg — ABNORMAL HIGH (ref 25.1–34.0)
MCHC: 34.7 g/dL (ref 31.5–36.0)
MCV: 103.6 fL — AB (ref 79.5–101.0)
MONO#: 0.3 10*3/uL (ref 0.1–0.9)
MONO%: 5.9 % (ref 0.0–14.0)
NEUT%: 58.9 % (ref 38.4–76.8)
NEUTROS ABS: 2.8 10*3/uL (ref 1.5–6.5)
Platelets: 261 10*3/uL (ref 145–400)
RBC: 3.75 10*6/uL (ref 3.70–5.45)
RDW: 13.7 % (ref 11.2–14.5)
WBC: 4.8 10*3/uL (ref 3.9–10.3)
lymph#: 1.6 10*3/uL (ref 0.9–3.3)

## 2015-05-02 LAB — COMPREHENSIVE METABOLIC PANEL
ALBUMIN: 3.9 g/dL (ref 3.5–5.0)
ALK PHOS: 57 U/L (ref 40–150)
ALT: 38 U/L (ref 0–55)
AST: 19 U/L (ref 5–34)
Anion Gap: 9 mEq/L (ref 3–11)
BUN: 16.7 mg/dL (ref 7.0–26.0)
CALCIUM: 10.1 mg/dL (ref 8.4–10.4)
CO2: 23 mEq/L (ref 22–29)
Chloride: 109 mEq/L (ref 98–109)
Creatinine: 1.1 mg/dL (ref 0.6–1.1)
EGFR: 55 mL/min/{1.73_m2} — AB (ref >90)
GLUCOSE: 137 mg/dL (ref 70–140)
Potassium: 3.9 mEq/L (ref 3.5–5.1)
SODIUM: 141 meq/L (ref 136–145)
TOTAL PROTEIN: 7.2 g/dL (ref 6.4–8.3)

## 2015-05-02 MED ORDER — DENOSUMAB 120 MG/1.7ML ~~LOC~~ SOLN
120.0000 mg | Freq: Once | SUBCUTANEOUS | Status: AC
  Administered 2015-05-02: 120 mg via SUBCUTANEOUS
  Filled 2015-05-02: qty 1.7

## 2015-05-07 ENCOUNTER — Other Ambulatory Visit: Payer: Self-pay | Admitting: *Deleted

## 2015-05-07 DIAGNOSIS — C50411 Malignant neoplasm of upper-outer quadrant of right female breast: Secondary | ICD-10-CM

## 2015-05-07 MED ORDER — PALBOCICLIB 125 MG PO CAPS: 125.0000 mg | ORAL_CAPSULE | Freq: Every day | ORAL | Status: DC

## 2015-05-17 ENCOUNTER — Ambulatory Visit (INDEPENDENT_AMBULATORY_CARE_PROVIDER_SITE_OTHER): Payer: BLUE CROSS/BLUE SHIELD | Admitting: Obstetrics and Gynecology

## 2015-05-17 ENCOUNTER — Encounter: Payer: Self-pay | Admitting: Obstetrics and Gynecology

## 2015-05-17 ENCOUNTER — Ambulatory Visit: Payer: BLUE CROSS/BLUE SHIELD | Admitting: Obstetrics and Gynecology

## 2015-05-17 VITALS — BP 124/78 | HR 80 | Resp 16 | Ht 63.0 in | Wt 224.8 lb

## 2015-05-17 DIAGNOSIS — Z113 Encounter for screening for infections with a predominantly sexual mode of transmission: Secondary | ICD-10-CM

## 2015-05-17 DIAGNOSIS — Z01419 Encounter for gynecological examination (general) (routine) without abnormal findings: Secondary | ICD-10-CM

## 2015-05-17 NOTE — Patient Instructions (Signed)

## 2015-05-17 NOTE — Progress Notes (Signed)
Patient ID: Cheryl Moore, female   DOB: March 13, 1960, 55 y.o.   MRN: [id number] 55 y.o. [id number] Married Caucasian female here for annual exam.    Breast CA care going well.  On Ibrance.   Feels great since stopping Zolodex.  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   PCP:   Oncology. Will establish care with a PCP at Dr. Erick Blinks office.   Patient's last menstrual period was 03/26/2012 (approximate).       Sexually active: Yes.   female The current method of family planning is none.    Exercising: Yes.    swimming, gardening and walking. Smoker:  no  Health Maintenance: Pap:  05-03-13 Neg:Neg HR HPV History of abnormal Pap:  no MMG:  05-03-12 Dx'd with Rt.breast cancer--Bil.mastectomy Colonoscopy:  09-12-13 polyps with Dr. Verdell Face due 08/2016 BMD:   n/a  Result  n/a TDaP:  05-02-12 Gardasil:   N/A Hep C:  Today.  Screening Labs:  Hb today: 13.5 on 05-02-15--see Epic, Urine today: unable to void   reports that she quit smoking about 13 years ago. Her smoking use included Cigarettes. She has a 1.25 pack-year smoking history. She has never used smokeless tobacco. She reports that she drinks about 0.6 oz of alcohol per week. She reports that she does not use illicit drugs.  Past Medical History  Diagnosis Date  . Breast cancer ([place])     right  . Anxiety   . Hot flashes   . History of radiation therapy 11/28/12-03/06/13    right breast/64.4Gy/ right hip=37.5Gy   . Obesity   . Personal history of colonic polyps - ssp and adenomas 09/19/2013  . 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 Elevated hemoglobin A1c 11/05/14    level 6.2    Past Surgical History  Procedure Laterality Date  . Cesarean section  2005  . Breast surgery Right     breast bx  . Breast biopsy Right 05/23/2012    Procedure: SKIN PUNCH BIOPSY RIGHT BREAST;  Surgeon: [name], MD;  Location: [facility];  Service: General;  Laterality: Right;  . Portacath placement Left 05/23/2012     Procedure: INSERTION PORT-A-CATH;  Surgeon: [name], MD;  Location: [facility];  Service: General;  Laterality: Left;  . Total mastectomy Left 10/26/2012    Dr Donne Hazel  . Simple mastectomy with axillary sentinel node biopsy Left 10/26/2012    Procedure: TOTAL MASTECTOMY;  Surgeon: [name], MD;  Location: [facility];  Service: General;  Laterality: Left;  Marland Kitchen Mastectomy modified radical Right 10/26/2012    Procedure: MASTECTOMY MODIFIED RADICAL;  Surgeon: [name], MD;  Location: [facility];  Service: General;  Laterality: Right;  . Radiology with anesthesia N/A 08/24/2013    Procedure: MRI;  Surgeon: Medication Radiologist, MD;  Location: [facility];  Service: Radiology;  Laterality: N/A;  . Port-a-cath removal Left 04/10/2014    Procedure: REMOVAL PORT-A-CATH;  Surgeon: [name], MD;  Location: [facility];  Service: General;  Laterality: Left;  . Laparoscopic salpingo oopherectomy Bilateral 10/22/2014    Procedure: LAPAROSCOPIC SALPINGO OOPHORECTOMY;  Surgeon: Nunzio Cobbs, MD;  Location: [facility];  Service: Gynecology;  Laterality: Bilateral;  BMI 41.82    Current Outpatient Prescriptions  Medication Sig Dispense Refill  . ALPRAZolam (XANAX) 0.25 MG tablet Take 1 tablet (0.25 mg total) by mouth at bedtime as needed. 30 tablet 0  . Ascorbic Acid (VITAMIN C PO) Take 1 tablet  by mouth daily.     Marland Kitchen CALCIUM PO Take 1 tablet by mouth daily.    Marland Kitchen denosumab (XGEVA) 120 MG/1.7ML SOLN injection Inject 120 mg into the skin every 30 (thirty) days.    Marland Kitchen gabapentin (NEURONTIN) 100 MG capsule Take 1 capsule (100 mg total) by mouth daily. 90 capsule 2  . gabapentin (NEURONTIN) 300 MG capsule Take 1 capsule (300 mg total) by mouth at bedtime. 90 capsule 4  . ibuprofen (ADVIL,MOTRIN) 800 MG tablet Take 1 tablet (800 mg total) by mouth every 8 (eight) hours as needed. 30 tablet 0  . letrozole (FEMARA) 2.5 MG tablet Take 1 tablet (2.5 mg total) by mouth daily. 90 tablet 0  . Multiple Vitamin  (MULTIVITAMIN) tablet Take 1 tablet by mouth daily.    . palbociclib (IBRANCE) 125 MG capsule Take 1 capsule (125 mg total) by mouth daily with breakfast. Take 1 capsule daily for 21 days on 7 days off. 21 capsule 0  . prochlorperazine (COMPAZINE) 10 MG tablet Take 1 tablet (10 mg total) by mouth every 6 (six) hours as needed for nausea or vomiting. 30 tablet 2  . psyllium (METAMUCIL) 58.6 % powder Take 1 packet by mouth daily.    . traMADol (ULTRAM) 50 MG tablet Take 1 tablet (50 mg total) by mouth every 6 (six) hours as needed. for pain 30 tablet 0  . valACYclovir (VALTREX) 500 MG tablet Take 1 tablet (500 mg total) by mouth 2 (two) times daily. 60 tablet 1  . venlafaxine (EFFEXOR) 75 MG tablet Take 1 tablet (75 mg total) by mouth daily. 90 tablet 0   No current facility-administered medications for this visit.    Family History  Problem Relation Age of Onset  . Adopted: Yes    ROS:  Pertinent items are noted in HPI.  Otherwise, a comprehensive ROS was negative.  Exam:   BP 124/78 mmHg  Pulse 80  Resp 16  Ht 5\' 3"  (1.6 m)  Wt 224 lb 12.8 oz (101.969 kg)  BMI 39.83 kg/m2  LMP 03/26/2012 (Approximate)    General appearance: alert, cooperative and appears stated age Head: Normocephalic, without obvious abnormality, atraumatic Neck: no adenopathy, supple, symmetrical, trachea midline and thyroid normal to inspection and palpation Lungs: clear to auscultation bilaterally Breasts: normal appearance, no masses or tenderness, consistent with bilateral mastectomy.  Heart: regular rate and rhythm Abdomen: incisions:  Yes.     laparoscopic , obese, soft, non-tender; no masses, no organomegaly Extremities: extremities normal, atraumatic, no cyanosis or edema Skin: Skin color, texture, turgor normal. No rashes or lesions Lymph nodes: Cervical, supraclavicular, and axillary nodes normal. No abnormal inguinal nodes palpated Neurologic: Grossly normal  Pelvic: External genitalia:  no  lesions              Urethra:  normal appearing urethra with no masses, tenderness or lesions              Bartholins and Skenes: normal                 Vagina: normal appearing vagina with normal color and discharge, no lesions              Cervix: no lesions              Pap taken: Yes.   Bimanual Exam:  Uterus:  normal size, contour, position, consistency, mobility, non-tender.  Exam limited by body habitus.              Adnexa:  no mass, fullness, tenderness              Rectal exam: Yes.  .  Confirms.              Anus:  normal sphincter tone, no lesions  Chaperone was present for exam.  Assessment:   Well woman visit with normal exam. Status post bilateral mastectomy.  On Femara and Ibrance for tx of metastatic disease. Status post laparoscopic bilateral salpingo-oophorectomy.  Elevated hemoglobin A1C.   Plan: Yearly mammogram recommended after age 82.  Recommended self breast exam.  Pap and HR HPV as above. Discussed Calcium, Vitamin D, regular exercise program including cardiovascular and weight bearing exercise. Labs performed.  Yes.  .   See orders.  Hep C aby.  Prescription medication(s) given.  No..    Follow up annually and prn.      After visit summary provided.

## 2015-05-18 LAB — HEPATITIS C ANTIBODY: HCV Ab: NEGATIVE

## 2015-05-22 LAB — IPS PAP TEST WITH HPV

## 2015-05-24 ENCOUNTER — Other Ambulatory Visit (HOSPITAL_BASED_OUTPATIENT_CLINIC_OR_DEPARTMENT_OTHER): Payer: BLUE CROSS/BLUE SHIELD

## 2015-05-24 DIAGNOSIS — C50211 Malignant neoplasm of upper-inner quadrant of right female breast: Secondary | ICD-10-CM | POA: Diagnosis not present

## 2015-05-24 DIAGNOSIS — C50411 Malignant neoplasm of upper-outer quadrant of right female breast: Secondary | ICD-10-CM

## 2015-05-24 LAB — COMPREHENSIVE METABOLIC PANEL
ALT: 27 U/L (ref 0–55)
ANION GAP: 10 meq/L (ref 3–11)
AST: 20 U/L (ref 5–34)
Albumin: 3.9 g/dL (ref 3.5–5.0)
Alkaline Phosphatase: 50 U/L (ref 40–150)
BUN: 15.3 mg/dL (ref 7.0–26.0)
CHLORIDE: 110 meq/L — AB (ref 98–109)
CO2: 21 meq/L — AB (ref 22–29)
CREATININE: 1 mg/dL (ref 0.6–1.1)
Calcium: 8.8 mg/dL (ref 8.4–10.4)
EGFR: 66 mL/min/{1.73_m2} — ABNORMAL LOW (ref >90)
Glucose: 97 mg/dl (ref 70–140)
POTASSIUM: 4.2 meq/L (ref 3.5–5.1)
Sodium: 140 mEq/L (ref 136–145)
Total Bilirubin: <0.3 mg/dL (ref 0.20–1.20)
Total Protein: 6.9 g/dL (ref 6.4–8.3)

## 2015-05-24 LAB — CBC WITH DIFFERENTIAL/PLATELET
BASO%: 0.3 % (ref 0.0–2.0)
BASOS ABS: 0 10*3/uL (ref 0.0–0.1)
EOS%: 0.3 % (ref 0.0–7.0)
Eosinophils Absolute: 0 10*3/uL (ref 0.0–0.5)
HCT: 36.1 % (ref 34.8–46.6)
HGB: 13 g/dL (ref 11.6–15.9)
LYMPH%: 51.5 % — AB (ref 14.0–49.7)
MCH: 36.4 pg — AB (ref 25.1–34.0)
MCHC: 36 g/dL (ref 31.5–36.0)
MCV: 101.1 fL — ABNORMAL HIGH (ref 79.5–101.0)
MONO#: 0.2 10*3/uL (ref 0.1–0.9)
MONO%: 6.4 % (ref 0.0–14.0)
NEUT#: 1.2 10*3/uL — ABNORMAL LOW (ref 1.5–6.5)
NEUT%: 41.5 % (ref 38.4–76.8)
PLATELETS: 209 10*3/uL (ref 145–400)
RBC: 3.57 10*6/uL — AB (ref 3.70–5.45)
RDW: 14.1 % (ref 11.2–14.5)
WBC: 3 10*3/uL — ABNORMAL LOW (ref 3.9–10.3)
lymph#: 1.5 10*3/uL (ref 0.9–3.3)

## 2015-05-30 ENCOUNTER — Other Ambulatory Visit (HOSPITAL_BASED_OUTPATIENT_CLINIC_OR_DEPARTMENT_OTHER): Payer: BLUE CROSS/BLUE SHIELD

## 2015-05-30 DIAGNOSIS — C50411 Malignant neoplasm of upper-outer quadrant of right female breast: Secondary | ICD-10-CM

## 2015-05-30 DIAGNOSIS — C50211 Malignant neoplasm of upper-inner quadrant of right female breast: Secondary | ICD-10-CM | POA: Diagnosis not present

## 2015-05-30 LAB — CBC WITH DIFFERENTIAL/PLATELET
BASO%: 1.2 % (ref 0.0–2.0)
BASOS ABS: 0 10*3/uL (ref 0.0–0.1)
EOS ABS: 0 10*3/uL (ref 0.0–0.5)
EOS%: 0.3 % (ref 0.0–7.0)
HEMATOCRIT: 36.3 % (ref 34.8–46.6)
HGB: 13.1 g/dL (ref 11.6–15.9)
LYMPH#: 1.6 10*3/uL (ref 0.9–3.3)
LYMPH%: 46.2 % (ref 14.0–49.7)
MCH: 36.4 pg — ABNORMAL HIGH (ref 25.1–34.0)
MCHC: 36.1 g/dL — AB (ref 31.5–36.0)
MCV: 100.8 fL (ref 79.5–101.0)
MONO#: 0.4 10*3/uL (ref 0.1–0.9)
MONO%: 11.5 % (ref 0.0–14.0)
NEUT#: 1.4 10*3/uL — ABNORMAL LOW (ref 1.5–6.5)
NEUT%: 40.8 % (ref 38.4–76.8)
Platelets: 221 10*3/uL (ref 145–400)
RBC: 3.6 10*6/uL — AB (ref 3.70–5.45)
RDW: 14.4 % (ref 11.2–14.5)
WBC: 3.4 10*3/uL — ABNORMAL LOW (ref 3.9–10.3)

## 2015-05-30 LAB — COMPREHENSIVE METABOLIC PANEL
ALT: 36 U/L (ref 0–55)
AST: 23 U/L (ref 5–34)
Albumin: 3.7 g/dL (ref 3.5–5.0)
Alkaline Phosphatase: 49 U/L (ref 40–150)
Anion Gap: 9 mEq/L (ref 3–11)
BUN: 13.7 mg/dL (ref 7.0–26.0)
CALCIUM: 9 mg/dL (ref 8.4–10.4)
CHLORIDE: 110 meq/L — AB (ref 98–109)
CO2: 20 meq/L — AB (ref 22–29)
CREATININE: 1 mg/dL (ref 0.6–1.1)
EGFR: 63 mL/min/{1.73_m2} — ABNORMAL LOW (ref >90)
Glucose: 121 mg/dl (ref 70–140)
Potassium: 4 mEq/L (ref 3.5–5.1)
Sodium: 139 mEq/L (ref 136–145)
Total Bilirubin: <0.3 mg/dL (ref 0.20–1.20)
Total Protein: 6.7 g/dL (ref 6.4–8.3)

## 2015-06-20 ENCOUNTER — Other Ambulatory Visit: Payer: Self-pay

## 2015-06-20 DIAGNOSIS — C50411 Malignant neoplasm of upper-outer quadrant of right female breast: Secondary | ICD-10-CM

## 2015-06-20 MED ORDER — PALBOCICLIB 125 MG PO CAPS: 125.0000 mg | ORAL_CAPSULE | Freq: Every day | ORAL | Status: DC

## 2015-06-21 ENCOUNTER — Other Ambulatory Visit (HOSPITAL_BASED_OUTPATIENT_CLINIC_OR_DEPARTMENT_OTHER): Payer: BLUE CROSS/BLUE SHIELD

## 2015-06-21 ENCOUNTER — Encounter (HOSPITAL_COMMUNITY): Payer: Self-pay

## 2015-06-21 ENCOUNTER — Ambulatory Visit (HOSPITAL_COMMUNITY)
Admission: RE | Admit: 2015-06-21 | Discharge: 2015-06-21 | Disposition: A | Discharge status: Home / Self Care | Payer: BLUE CROSS/BLUE SHIELD | Source: Ambulatory Visit | Attending: Nurse Practitioner | Admitting: Nurse Practitioner

## 2015-06-21 DIAGNOSIS — C50411 Malignant neoplasm of upper-outer quadrant of right female breast: Secondary | ICD-10-CM

## 2015-06-21 DIAGNOSIS — C7951 Secondary malignant neoplasm of bone: Secondary | ICD-10-CM | POA: Insufficient documentation

## 2015-06-21 DIAGNOSIS — K760 Fatty (change of) liver, not elsewhere classified: Secondary | ICD-10-CM | POA: Insufficient documentation

## 2015-06-21 DIAGNOSIS — C50211 Malignant neoplasm of upper-inner quadrant of right female breast: Secondary | ICD-10-CM

## 2015-06-21 DIAGNOSIS — Z9013 Acquired absence of bilateral breasts and nipples: Secondary | ICD-10-CM | POA: Insufficient documentation

## 2015-06-21 LAB — CBC WITH DIFFERENTIAL/PLATELET
BASO%: 0.9 % (ref 0.0–2.0)
BASOS ABS: 0 10*3/uL (ref 0.0–0.1)
EOS ABS: 0 10*3/uL (ref 0.0–0.5)
EOS%: 0.4 % (ref 0.0–7.0)
HEMATOCRIT: 35.7 % (ref 34.8–46.6)
HEMOGLOBIN: 12.7 g/dL (ref 11.6–15.9)
LYMPH#: 1.1 10*3/uL (ref 0.9–3.3)
LYMPH%: 47.7 % (ref 14.0–49.7)
MCH: 35.9 pg — AB (ref 25.1–34.0)
MCHC: 35.6 g/dL (ref 31.5–36.0)
MCV: 100.8 fL (ref 79.5–101.0)
MONO#: 0.2 10*3/uL (ref 0.1–0.9)
MONO%: 6.8 % (ref 0.0–14.0)
NEUT%: 44.2 % (ref 38.4–76.8)
NEUTROS ABS: 1 10*3/uL — AB (ref 1.5–6.5)
Platelets: 185 10*3/uL (ref 145–400)
RBC: 3.54 10*6/uL — ABNORMAL LOW (ref 3.70–5.45)
RDW: 14.1 % (ref 11.2–14.5)
WBC: 2.4 10*3/uL — AB (ref 3.9–10.3)

## 2015-06-21 LAB — COMPREHENSIVE METABOLIC PANEL
ALBUMIN: 3.7 g/dL (ref 3.5–5.0)
ALK PHOS: 46 U/L (ref 40–150)
ALT: 29 U/L (ref 0–55)
AST: 19 U/L (ref 5–34)
Anion Gap: 10 mEq/L (ref 3–11)
BUN: 11.9 mg/dL (ref 7.0–26.0)
CO2: 18 mEq/L — ABNORMAL LOW (ref 22–29)
Calcium: 8.6 mg/dL (ref 8.4–10.4)
Chloride: 113 mEq/L — ABNORMAL HIGH (ref 98–109)
Creatinine: 1 mg/dL (ref 0.6–1.1)
EGFR: 66 mL/min/{1.73_m2} — AB (ref >90)
GLUCOSE: 117 mg/dL (ref 70–140)
POTASSIUM: 4 meq/L (ref 3.5–5.1)
Sodium: 141 mEq/L (ref 136–145)
TOTAL PROTEIN: 6.5 g/dL (ref 6.4–8.3)
Total Bilirubin: 0.44 mg/dL (ref 0.20–1.20)

## 2015-06-21 MED ORDER — IOPAMIDOL (ISOVUE-300) INJECTION 61%
75.0000 mL | Freq: Once | INTRAVENOUS | Status: AC | PRN
  Administered 2015-06-21: 75 mL via INTRAVENOUS

## 2015-06-27 ENCOUNTER — Ambulatory Visit (HOSPITAL_BASED_OUTPATIENT_CLINIC_OR_DEPARTMENT_OTHER): Payer: BLUE CROSS/BLUE SHIELD | Admitting: Oncology

## 2015-06-27 ENCOUNTER — Ambulatory Visit (HOSPITAL_BASED_OUTPATIENT_CLINIC_OR_DEPARTMENT_OTHER): Payer: BLUE CROSS/BLUE SHIELD

## 2015-06-27 ENCOUNTER — Other Ambulatory Visit (HOSPITAL_BASED_OUTPATIENT_CLINIC_OR_DEPARTMENT_OTHER): Payer: BLUE CROSS/BLUE SHIELD

## 2015-06-27 ENCOUNTER — Telehealth: Payer: Self-pay | Admitting: Oncology

## 2015-06-27 VITALS — BP 126/59 | HR 66 | Temp 98.3°F | Resp 20 | Ht 63.0 in | Wt 225.6 lb

## 2015-06-27 DIAGNOSIS — C7951 Secondary malignant neoplasm of bone: Secondary | ICD-10-CM | POA: Diagnosis not present

## 2015-06-27 DIAGNOSIS — C50211 Malignant neoplasm of upper-inner quadrant of right female breast: Secondary | ICD-10-CM

## 2015-06-27 DIAGNOSIS — C50411 Malignant neoplasm of upper-outer quadrant of right female breast: Secondary | ICD-10-CM

## 2015-06-27 DIAGNOSIS — C773 Secondary and unspecified malignant neoplasm of axilla and upper limb lymph nodes: Secondary | ICD-10-CM

## 2015-06-27 LAB — CBC WITH DIFFERENTIAL/PLATELET
BASO%: 1.2 % (ref 0.0–2.0)
Basophils Absolute: 0 10*3/uL (ref 0.0–0.1)
EOS ABS: 0 10*3/uL (ref 0.0–0.5)
EOS%: 0.7 % (ref 0.0–7.0)
HCT: 36.7 % (ref 34.8–46.6)
HGB: 12.8 g/dL (ref 11.6–15.9)
LYMPH%: 46.8 % (ref 14.0–49.7)
MCH: 36.2 pg — AB (ref 25.1–34.0)
MCHC: 35 g/dL (ref 31.5–36.0)
MCV: 103.7 fL — AB (ref 79.5–101.0)
MONO#: 0.4 10*3/uL (ref 0.1–0.9)
MONO%: 11.2 % (ref 0.0–14.0)
NEUT%: 40.1 % (ref 38.4–76.8)
NEUTROS ABS: 1.3 10*3/uL — AB (ref 1.5–6.5)
Platelets: 260 10*3/uL (ref 145–400)
RBC: 3.54 10*6/uL — AB (ref 3.70–5.45)
RDW: 14.8 % — ABNORMAL HIGH (ref 11.2–14.5)
WBC: 3.2 10*3/uL — AB (ref 3.9–10.3)
lymph#: 1.5 10*3/uL (ref 0.9–3.3)

## 2015-06-27 LAB — COMPREHENSIVE METABOLIC PANEL
ALBUMIN: 3.9 g/dL (ref 3.5–5.0)
ALK PHOS: 51 U/L (ref 40–150)
ALT: 26 U/L (ref 0–55)
AST: 16 U/L (ref 5–34)
Anion Gap: 8 mEq/L (ref 3–11)
BUN: 15.7 mg/dL (ref 7.0–26.0)
CALCIUM: 9.3 mg/dL (ref 8.4–10.4)
CO2: 22 mEq/L (ref 22–29)
CREATININE: 1 mg/dL (ref 0.6–1.1)
Chloride: 109 mEq/L (ref 98–109)
EGFR: 65 mL/min/{1.73_m2} — ABNORMAL LOW (ref >90)
Glucose: 120 mg/dl (ref 70–140)
POTASSIUM: 4 meq/L (ref 3.5–5.1)
Sodium: 139 mEq/L (ref 136–145)
Total Bilirubin: 0.3 mg/dL (ref 0.20–1.20)
Total Protein: 6.9 g/dL (ref 6.4–8.3)

## 2015-06-27 MED ORDER — LETROZOLE 2.5 MG PO TABS: 2.5000 mg | ORAL_TABLET | Freq: Every day | ORAL | Status: DC

## 2015-06-27 MED ORDER — ONDANSETRON HCL 4 MG PO TABS: 4.0000 mg | ORAL_TABLET | Freq: Every day | ORAL | Status: DC | PRN

## 2015-06-27 MED ORDER — TRAMADOL HCL 50 MG PO TABS
50.0000 mg | ORAL_TABLET | Freq: Four times a day (QID) | ORAL | Status: DC | PRN
Start: 2015-06-27 — End: 2015-07-09

## 2015-06-27 MED ORDER — ALPRAZOLAM 0.25 MG PO TABS: 0.2500 mg | ORAL_TABLET | Freq: Every evening | ORAL | Status: DC | PRN

## 2015-06-27 MED ORDER — VENLAFAXINE HCL 75 MG PO TABS: 75.0000 mg | ORAL_TABLET | Freq: Every day | ORAL | Status: DC

## 2015-06-27 MED ORDER — DENOSUMAB 120 MG/1.7ML ~~LOC~~ SOLN
120.0000 mg | Freq: Once | SUBCUTANEOUS | Status: AC
  Administered 2015-06-27: 120 mg via SUBCUTANEOUS
  Filled 2015-06-27: qty 1.7
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
## 2015-06-27 NOTE — Telephone Encounter (Signed)
appt made and avs printed °

## 2015-06-27 NOTE — Patient Instructions (Signed)
Denosumab injection What is this medicine? DENOSUMAB (den oh sue mab) slows bone breakdown. Prolia is used to treat osteoporosis in women after menopause and in men. Xgeva is used to prevent bone fractures and other bone problems caused by cancer bone metastases. Xgeva is also used to treat giant cell tumor of the bone. This medicine may be used for other purposes; ask your health care provider or pharmacist if you have questions. COMMON BRAND NAME(S): Prolia, XGEVA What should I tell my health care provider before I take this medicine? They need to know if you have any of these conditions: -dental disease -eczema -infection or history of infections -kidney disease or on dialysis -low blood calcium or vitamin D -malabsorption syndrome -scheduled to have surgery or tooth extraction -taking medicine that contains denosumab -thyroid or parathyroid disease -an unusual reaction to denosumab, other medicines, foods, dyes, or preservatives -pregnant or trying to get pregnant -breast-feeding How should I use this medicine? This medicine is for injection under the skin. It is given by a health care professional in a hospital or clinic setting. If you are getting Prolia, a special MedGuide will be given to you by the pharmacist with each prescription and refill. Be sure to read this information carefully each time. For Prolia, talk to your pediatrician regarding the use of this medicine in children. Special care may be needed. For Xgeva, talk to your pediatrician regarding the use of this medicine in children. While this drug may be prescribed for children as young as 13 years for selected conditions, precautions do apply. Overdosage: If you think you've taken too much of this medicine contact a poison control center or emergency room at once. Overdosage: If you think you have taken too much of this medicine contact a poison control center or emergency room at once. NOTE: This medicine is only for  you. Do not share this medicine with others. What if I miss a dose? It is important not to miss your dose. Call your doctor or health care professional if you are unable to keep an appointment. What may interact with this medicine? Do not take this medicine with any of the following medications: -other medicines containing denosumab This medicine may also interact with the following medications: -medicines that suppress the immune system -medicines that treat cancer -steroid medicines like prednisone or cortisone This list may not describe all possible interactions. Give your health care provider a list of all the medicines, herbs, non-prescription drugs, or dietary supplements you use. Also tell them if you smoke, drink alcohol, or use illegal drugs. Some items may interact with your medicine. What should I watch for while using this medicine? Visit your doctor or health care professional for regular checks on your progress. Your doctor or health care professional may order blood tests and other tests to see how you are doing. Call your doctor or health care professional if you get a cold or other infection while receiving this medicine. Do not treat yourself. This medicine may decrease your body's ability to fight infection. You should make sure you get enough calcium and vitamin D while you are taking this medicine, unless your doctor tells you not to. Discuss the foods you eat and the vitamins you take with your health care professional. See your dentist regularly. Brush and floss your teeth as directed. Before you have any dental work done, tell your dentist you are receiving this medicine. Do not become pregnant while taking this medicine or for 5 months after stopping   it. Women should inform their doctor if they wish to become pregnant or think they might be pregnant. There is a potential for serious side effects to an unborn child. Talk to your health care professional or pharmacist for more  information. What side effects may I notice from receiving this medicine? Side effects that you should report to your doctor or health care professional as soon as possible: -allergic reactions like skin rash, itching or hives, swelling of the face, lips, or tongue -breathing problems -chest pain -fast, irregular heartbeat -feeling faint or lightheaded, falls -fever, chills, or any other sign of infection -muscle spasms, tightening, or twitches -numbness or tingling -skin blisters or bumps, or is dry, peels, or red -slow healing or unexplained pain in the mouth or jaw -unusual bleeding or bruising Side effects that usually do not require medical attention (Report these to your doctor or health care professional if they continue or are bothersome.): -muscle pain -stomach upset, gas This list may not describe all possible side effects. Call your doctor for medical advice about side effects. You may report side effects to FDA at 1-800-FDA-1088. Where should I keep my medicine? This medicine is only given in a clinic, doctor's office, or other health care setting and will not be stored at home. NOTE: This sheet is a summary. It may not cover all possible information. If you have questions about this medicine, talk to your doctor, pharmacist, or health care provider.  2015, Elsevier/Gold Standard. (2011-07-13 12:37:47) Goserelin injection What is this medicine? GOSERELIN (GOE se rel in) is similar to a hormone found in the body. It lowers the amount of sex hormones that the body makes. Men will have lower testosterone levels and women will have lower estrogen levels while taking this medicine. In men, this medicine is used to treat prostate cancer; the injection is either given once per month or once every 12 weeks. A once per month injection (only) is used to treat women with endometriosis, dysfunctional uterine bleeding, or advanced breast cancer. This medicine may be used for other purposes;  ask your health care provider or pharmacist if you have questions. COMMON BRAND NAME(S): Zoladex What should I tell my health care provider before I take this medicine? They need to know if you have any of these conditions (some only apply to women): -diabetes -heart disease or previous heart attack -high blood pressure -high cholesterol -kidney disease -osteoporosis or low bone density -problems passing urine -spinal cord injury -stroke -tobacco smoker -an unusual or allergic reaction to goserelin, hormone therapy, other medicines, foods, dyes, or preservatives -pregnant or trying to get pregnant -breast-feeding How should I use this medicine? This medicine is for injection under the skin. It is given by a health care professional in a hospital or clinic setting. Men receive this injection once every 4 weeks or once every 12 weeks. Women will only receive the once every 4 weeks injection. Talk to your pediatrician regarding the use of this medicine in children. Special care may be needed. Overdosage: If you think you have taken too much of this medicine contact a poison control center or emergency room at once. NOTE: This medicine is only for you. Do not share this medicine with others. What if I miss a dose? It is important not to miss your dose. Call your doctor or health care professional if you are unable to keep an appointment. What may interact with this medicine? -female hormones like estrogen -herbal or dietary supplements like black cohosh, chasteberry,   or DHEA -female hormones like testosterone -prasterone This list may not describe all possible interactions. Give your health care provider a list of all the medicines, herbs, non-prescription drugs, or dietary supplements you use. Also tell them if you smoke, drink alcohol, or use illegal drugs. Some items may interact with your medicine. What should I watch for while using this medicine? Visit your doctor or health care  professional for regular checks on your progress. Your symptoms may appear to get worse during the first weeks of this therapy. Tell your doctor or healthcare professional if your symptoms do not start to get better or if they get worse after this time. Your bones may get weaker if you take this medicine for a long time. If you smoke or frequently drink alcohol you may increase your risk of bone loss. A family history of osteoporosis, chronic use of drugs for seizures (convulsions), or corticosteroids can also increase your risk of bone loss. Talk to your doctor about how to keep your bones strong. This medicine should stop regular monthly menstration in women. Tell your doctor if you continue to menstrate. Women should not become pregnant while taking this medicine or for 12 weeks after stopping this medicine. Women should inform their doctor if they wish to become pregnant or think they might be pregnant. There is a potential for serious side effects to an unborn child. Talk to your health care professional or pharmacist for more information. Do not breast-feed an infant while taking this medicine. Men should inform their doctors if they wish to father a child. This medicine may lower sperm counts. Talk to your health care professional or pharmacist for more information. What side effects may I notice from receiving this medicine? Side effects that you should report to your doctor or health care professional as soon as possible: -allergic reactions like skin rash, itching or hives, swelling of the face, lips, or tongue -bone pain -breathing problems -changes in vision -chest pain -feeling faint or lightheaded, falls -fever, chills -pain, swelling, warmth in the leg -pain, tingling, numbness in the hands or feet -signs and symptoms of low blood pressure like dizziness; feeling faint or lightheaded, falls; unusually weak or tired -stomach pain -swelling of the ankles, feet, hands -trouble passing  urine or change in the amount of urine -unusually high or low blood pressure -unusually weak or tired Side effects that usually do not require medical attention (report to your doctor or health care professional if they continue or are bothersome): -change in sex drive or performance -changes in breast size in both males and females -changes in emotions or moods -headache -hot flashes -irritation at site where injected -loss of appetite -skin problems like acne, dry skin -vaginal dryness This list may not describe all possible side effects. Call your doctor for medical advice about side effects. You may report side effects to FDA at 1-800-FDA-1088. Where should I keep my medicine? This drug is given in a hospital or clinic and will not be stored at home. NOTE: This sheet is a summary. It may not cover all possible information. If you have questions about this medicine, talk to your doctor, pharmacist, or health care provider.  2015, Elsevier/Gold Standard. (2013-03-21 11:10:35)  

## 2015-07-09 ENCOUNTER — Other Ambulatory Visit: Payer: Self-pay | Admitting: Oncology

## 2015-07-09 ENCOUNTER — Other Ambulatory Visit: Payer: Self-pay | Admitting: Nurse Practitioner

## 2015-07-09 DIAGNOSIS — C50411 Malignant neoplasm of upper-outer quadrant of right female breast: Secondary | ICD-10-CM

## 2015-07-09 MED ORDER — GABAPENTIN 300 MG PO CAPS: 300.0000 mg | ORAL_CAPSULE | Freq: Every day | ORAL | Status: DC

## 2015-07-09 NOTE — Telephone Encounter (Signed)
Xanax and tramadol faxed to [facility]

## 2015-07-22 ENCOUNTER — Telehealth: Payer: Self-pay | Admitting: *Deleted

## 2015-07-22 NOTE — Telephone Encounter (Signed)
This RN spoke with pt per refill request for Ibrance 125 mg filled through [facility].  Noted recent labs bordering on parimeter for dose reduction - pt is scheduled for lab this Thursday to restart drug Sunday.  Plan per above is refill will not be sent until labs obtained- there fore if dose is reduced - new prescription for 100 mg can be sent to [facility].  Of note per this call - Cheryl Moore was in an Urgent Care due to positive test for strep throat.

## 2015-07-25 ENCOUNTER — Other Ambulatory Visit (HOSPITAL_BASED_OUTPATIENT_CLINIC_OR_DEPARTMENT_OTHER): Payer: BLUE CROSS/BLUE SHIELD

## 2015-07-25 DIAGNOSIS — C50211 Malignant neoplasm of upper-inner quadrant of right female breast: Secondary | ICD-10-CM

## 2015-07-25 DIAGNOSIS — C50411 Malignant neoplasm of upper-outer quadrant of right female breast: Secondary | ICD-10-CM

## 2015-07-25 LAB — COMPREHENSIVE METABOLIC PANEL
ALT: 44 U/L (ref 0–55)
AST: 19 U/L (ref 5–34)
Albumin: 3.8 g/dL (ref 3.5–5.0)
Alkaline Phosphatase: 77 U/L (ref 40–150)
Anion Gap: 12 mEq/L — ABNORMAL HIGH (ref 3–11)
BUN: 13 mg/dL (ref 7.0–26.0)
CALCIUM: 9.5 mg/dL (ref 8.4–10.4)
CHLORIDE: 107 meq/L (ref 98–109)
CO2: 20 mEq/L — ABNORMAL LOW (ref 22–29)
Creatinine: 1.1 mg/dL (ref 0.6–1.1)
EGFR: 56 mL/min/{1.73_m2} — ABNORMAL LOW (ref >90)
GLUCOSE: 221 mg/dL — AB (ref 70–140)
POTASSIUM: 3.8 meq/L (ref 3.5–5.1)
SODIUM: 139 meq/L (ref 136–145)
Total Bilirubin: 0.47 mg/dL (ref 0.20–1.20)
Total Protein: 7.3 g/dL (ref 6.4–8.3)

## 2015-07-25 LAB — CBC WITH DIFFERENTIAL/PLATELET
BASO%: 0.5 % (ref 0.0–2.0)
BASOS ABS: 0 10*3/uL (ref 0.0–0.1)
EOS%: 0.2 % (ref 0.0–7.0)
Eosinophils Absolute: 0 10*3/uL (ref 0.0–0.5)
HCT: 35.8 % (ref 34.8–46.6)
HGB: 13 g/dL (ref 11.6–15.9)
LYMPH%: 24.1 % (ref 14.0–49.7)
MCH: 36.5 pg — AB (ref 25.1–34.0)
MCHC: 36.3 g/dL — AB (ref 31.5–36.0)
MCV: 100.6 fL (ref 79.5–101.0)
MONO#: 0.2 10*3/uL (ref 0.1–0.9)
MONO%: 5.5 % (ref 0.0–14.0)
NEUT#: 3.1 10*3/uL (ref 1.5–6.5)
NEUT%: 69.7 % (ref 38.4–76.8)
Platelets: 192 10*3/uL (ref 145–400)
RBC: 3.56 10*6/uL — ABNORMAL LOW (ref 3.70–5.45)
RDW: 14.3 % (ref 11.2–14.5)
WBC: 4.4 10*3/uL (ref 3.9–10.3)
lymph#: 1.1 10*3/uL (ref 0.9–3.3)

## 2015-07-26 ENCOUNTER — Telehealth: Payer: Self-pay | Admitting: *Deleted

## 2015-07-26 ENCOUNTER — Telehealth: Payer: Self-pay | Admitting: Oncology

## 2015-07-26 ENCOUNTER — Encounter: Payer: Self-pay | Admitting: Nurse Practitioner

## 2015-07-26 ENCOUNTER — Ambulatory Visit (HOSPITAL_BASED_OUTPATIENT_CLINIC_OR_DEPARTMENT_OTHER): Payer: BLUE CROSS/BLUE SHIELD | Admitting: Nurse Practitioner

## 2015-07-26 ENCOUNTER — Other Ambulatory Visit: Payer: Self-pay | Admitting: Nurse Practitioner

## 2015-07-26 ENCOUNTER — Telehealth: Payer: Self-pay | Admitting: Nurse Practitioner

## 2015-07-26 VITALS — BP 140/64 | HR 76 | Temp 98.6°F | Resp 19 | Ht 63.0 in | Wt 221.6 lb

## 2015-07-26 DIAGNOSIS — H6693 Otitis media, unspecified, bilateral: Secondary | ICD-10-CM | POA: Insufficient documentation

## 2015-07-26 DIAGNOSIS — H66013 Acute suppurative otitis media with spontaneous rupture of ear drum, bilateral: Secondary | ICD-10-CM

## 2015-07-26 DIAGNOSIS — H6503 Acute serous otitis media, bilateral: Secondary | ICD-10-CM

## 2015-07-26 DIAGNOSIS — C50411 Malignant neoplasm of upper-outer quadrant of right female breast: Secondary | ICD-10-CM

## 2015-07-26 MED ORDER — OFLOXACIN 0.3 % OT SOLN: 10.0000 [drp] | Freq: Two times a day (BID) | OTIC | Status: DC

## 2015-07-26 MED ORDER — LEVOFLOXACIN 750 MG PO TABS: 750.0000 mg | ORAL_TABLET | Freq: Every day | ORAL | Status: DC

## 2015-07-26 NOTE — Progress Notes (Signed)
SYMPTOM MANAGEMENT CLINIC    Chief Complaint: Bilateral ear pain, URI  HPI:  Cheryl Moore 55 y.o. female diagnosed with breast cancer.  Currently undergoing Ibrance oral therapy and Xgeva injections.   Patient's son was diagnosed with strep throat approximately 10 days ago.  Patient states that she developed a severe sore throat and URI symptoms approximately one week ago.  She was seen in urgent care clinic; and her rapid strep test was negative.  There was no culture obtained.  However, patient was prescribed a Zithromax pack; which she completed today.  Patient states that her throat continues to be very sore to the colon and she has had extreme pain in her bilateral ears for the past few days.  She states that approximately 2 days ago, her bilateral ears began to drain and the ear pain slightly improved.  She continues to complain of decreased hearing to the left ear.  She complains of only occasional headache.  She denies any cough or shortness of breath.  She denies any recent fevers or chills.  Exam today reveals patient with significant nasal congestion; but no facial tenderness with palpation.  Posterior oropharynx with erythema; but no exudate.  Left ear with apparent eardrum rupture; and thick, purulent discharge.  Right ear with no visible TM; but no purulent fluid.  There is surrounding erythema in the ear canal, however.  Breath sounds are clear bilaterally.  Vital signs are stable and patient is afebrile today.  Patient will be prescribed Levaquin 750 mg on a daily basis.  7 days; and ofloxacin antibiotic eardrops-10 drops to each ear twice daily for a total of 14 days.  Patient was also advised to avoid dunking her head underwater until her infection has completely cleared.  Patient will be scheduled to return to the symptom management clinic on Monday, 07/29/2015 for follow-up.  Patient was advised to go directly to the emergency department over the weekend with any  worsening symptoms whatsoever.  If patient's otitis media does not resolve-patient will need to consider an ENT referral for further evaluation and management.   No history exists.    Review of Systems  HENT: Positive for congestion, ear discharge, ear pain, hearing loss and sore throat.   Neurological: Positive for headaches.  All other systems reviewed and are negative.   Past Medical History  Diagnosis Date  . Anxiety   . Hot flashes   . History of radiation therapy 11/28/12-03/06/13    right breast/64.4Gy/ right hip=37.5Gy   . Obesity   . Personal history of colonic polyps - ssp and adenomas 09/19/2013  . 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 Elevated hemoglobin A1c 11/05/14    level 6.2  . Breast cancer [facility])     right    Past Surgical History  Procedure Laterality Date  . Cesarean section  2005  . Breast surgery Right     breast bx  . Breast biopsy Right 05/23/2012    Procedure: SKIN PUNCH BIOPSY RIGHT BREAST;  Surgeon: [name], MD;  Location: [place];  Service: General;  Laterality: Right;  . Portacath placement Left 05/23/2012    Procedure: INSERTION PORT-A-CATH;  Surgeon: [name], MD;  Location: [place];  Service: General;  Laterality: Left;  . Total mastectomy Left 10/26/2012    Dr Donne Hazel  . Simple mastectomy with axillary sentinel node biopsy Left 10/26/2012    Procedure: TOTAL MASTECTOMY;  Surgeon: [name], MD;  Location: [facility];  Service: General;  Laterality: Left;  Marland Kitchen Mastectomy modified radical Right 10/26/2012    Procedure: MASTECTOMY MODIFIED RADICAL;  Surgeon: [name], MD;  Location: [place];  Service: General;  Laterality: Right;  . Radiology with anesthesia N/A 08/24/2013    Procedure: MRI;  Surgeon: Medication Radiologist, MD;  Location: [place];  Service: Radiology;  Laterality: N/A;  . Port-a-cath removal Left 04/10/2014    Procedure: REMOVAL PORT-A-CATH;  Surgeon: [name], MD;  Location:  [place];  Service: General;  Laterality: Left;  . Laparoscopic salpingo oopherectomy Bilateral 10/22/2014    Procedure: LAPAROSCOPIC SALPINGO OOPHORECTOMY;  Surgeon: Nunzio Cobbs, MD;  Location: [facility];  Service: Gynecology;  Laterality: Bilateral;  BMI 41.82    has Obesity; History of colonic polyps; Breast cancer of upper-outer quadrant of right female breast ([place]); Perioral dermatitis; Shingles; Genetic testing; and Otitis media of both ears on her problem list.    is allergic to other and penicillins.    Medication List       This list is accurate as of: 07/26/15 11:13 AM.  Always use your most recent med list.               ALPRAZolam 0.25 MG tablet  Commonly known as:  XANAX  TAKE 1 TABLET BY MOUTH EVERY DAY AT BEDTIME AS NEEDED     CALCIUM PO  Take 1 tablet by mouth daily.     gabapentin 300 MG capsule  Commonly known as:  NEURONTIN  Take 1 capsule (300 mg total) by mouth at bedtime.     ibuprofen 800 MG tablet  Commonly known as:  ADVIL,MOTRIN  Take 1 tablet (800 mg total) by mouth every 8 (eight) hours as needed.     letrozole 2.5 MG tablet  Commonly known as:  FEMARA  Take 1 tablet (2.5 mg total) by mouth daily.     levofloxacin 750 MG tablet  Commonly known as:  LEVAQUIN  Take 1 tablet (750 mg total) by mouth daily.     multivitamin tablet  Take 1 tablet by mouth daily.     ofloxacin 0.3 % otic solution  Commonly known as:  FLOXIN OTIC  Place 10 drops into both ears 2 (two) times daily.     ondansetron 4 MG tablet  Commonly known as:  ZOFRAN  Take 1 tablet (4 mg total) by mouth daily as needed for nausea or vomiting.     palbociclib 125 MG capsule  Commonly known as:  IBRANCE  Take 1 capsule (125 mg total) by mouth daily with breakfast. Take 1 capsule daily for 21 days on 7 days off.     prochlorperazine 10 MG tablet  Commonly known as:  COMPAZINE  Take 1 tablet (10 mg total) by mouth every 6 (six) hours as needed for nausea or vomiting.       psyllium 58.6 % powder  Commonly known as:  METAMUCIL  Take 1 packet by mouth daily. Reported on 07/26/2015     traMADol 50 MG tablet  Commonly known as:  ULTRAM  TAKE 1 TABLET BY MOUTH EVERY 6 HOURS AS NEEDED FOR PAIN     valACYclovir 500 MG tablet  Commonly known as:  VALTREX  Take 1 tablet (500 mg total) by mouth 2 (two) times daily.     venlafaxine 75 MG tablet  Commonly known as:  EFFEXOR  Take 1 tablet (75 mg total) by mouth daily.     VITAMIN C  PO  Take 1 tablet by mouth daily.         PHYSICAL EXAMINATION  Oncology Vitals 07/26/2015 06/27/2015  Height 160 cm 160 cm  Weight 100.517 kg 102.331 kg  Weight (lbs) 221 lbs 10 oz 225 lbs 10 oz  BMI (kg/m2) 39.25 kg/m2 39.96 kg/m2  Temp 98.6 98.3  Pulse 76 66  Resp 19 20  SpO2 98 99  BSA (m2) 2.11 m2 2.13 m2   BP Readings from Last 2 Encounters:  07/26/15 140/64  06/27/15 126/59    Physical Exam  Constitutional: She is oriented to person, place, and time and well-developed, well-nourished, and in no distress.  HENT:  Head: Normocephalic and atraumatic.  Exam today reveals patient with significant nasal congestion; but no facial tenderness with palpation.  Posterior oropharynx with erythema; but no exudate.  Left ear with apparent eardrum rupture; and thick, purulent discharge.  Right ear with no visible TM; but no purulent fluid.  There is surrounding erythema in the ear canal, however.       Eyes: Conjunctivae and EOM are normal. Pupils are equal, round, and reactive to light. Right eye exhibits no discharge. Left eye exhibits no discharge. No scleral icterus.  Neck: Normal range of motion. Neck supple. No JVD present. No tracheal deviation present. No thyromegaly present.  Cardiovascular: Normal rate, regular rhythm, normal heart sounds and intact distal pulses.   Pulmonary/Chest: Effort normal and breath sounds normal. No respiratory distress. She has no wheezes. She has no rales. She exhibits no tenderness.   Abdominal: Soft. Bowel sounds are normal. She exhibits no distension and no mass. There is no tenderness. There is no rebound and no guarding.  Musculoskeletal: Normal range of motion. She exhibits no edema or tenderness.  Lymphadenopathy:    She has no cervical adenopathy.  Neurological: She is alert and oriented to person, place, and time. Gait normal.  Skin: Skin is warm and dry. No rash noted. No erythema. No pallor.  Psychiatric: Affect normal.  Nursing note and vitals reviewed.   LABORATORY DATA:. Appointment on 07/25/2015  Component Date Value Ref Range Status  . WBC 07/25/2015 4.4  3.9 - 10.3 10e3/uL Final  . NEUT# 07/25/2015 3.1  1.5 - 6.5 10e3/uL Final  . HGB 07/25/2015 13.0  11.6 - 15.9 g/dL Final  . HCT 07/25/2015 35.8  34.8 - 46.6 % Final  . Platelets 07/25/2015 192  145 - 400 10e3/uL Final  . MCV 07/25/2015 100.6  79.5 - 101.0 fL Final  . MCH 07/25/2015 36.5* 25.1 - 34.0 pg Final  . MCHC 07/25/2015 36.3* 31.5 - 36.0 g/dL Final  . RBC 07/25/2015 3.56* 3.70 - 5.45 10e6/uL Final  . RDW 07/25/2015 14.3  11.2 - 14.5 % Final  . lymph# 07/25/2015 1.1  0.9 - 3.3 10e3/uL Final  . MONO# 07/25/2015 0.2  0.1 - 0.9 10e3/uL Final  . Eosinophils Absolute 07/25/2015 0.0  0.0 - 0.5 10e3/uL Final  . Basophils Absolute 07/25/2015 0.0  0.0 - 0.1 10e3/uL Final  . NEUT% 07/25/2015 69.7  38.4 - 76.8 % Final  . LYMPH% 07/25/2015 24.1  14.0 - 49.7 % Final  . MONO% 07/25/2015 5.5  0.0 - 14.0 % Final  . EOS% 07/25/2015 0.2  0.0 - 7.0 % Final  . BASO% 07/25/2015 0.5  0.0 - 2.0 % Final  . Sodium 07/25/2015 139  136 - 145 mEq/L Final  . Potassium 07/25/2015 3.8  3.5 - 5.1 mEq/L Final  . Chloride 07/25/2015 107  98 -  109 mEq/L Final  . CO2 07/25/2015 20* 22 - 29 mEq/L Final  . Glucose 07/25/2015 221* 70 - 140 mg/dl Final   Glucose reference range is for nonfasting patients. Fasting glucose reference range is 70- 100.  Marland Kitchen BUN 07/25/2015 13.0  7.0 - 26.0 mg/dL Final  . Creatinine 07/25/2015 1.1   0.6 - 1.1 mg/dL Final  . Total Bilirubin 07/25/2015 0.47  0.20 - 1.20 mg/dL Final  . Alkaline Phosphatase 07/25/2015 77  40 - 150 U/L Final  . AST 07/25/2015 19  5 - 34 U/L Final  . ALT 07/25/2015 44  0 - 55 U/L Final  . Total Protein 07/25/2015 7.3  6.4 - 8.3 g/dL Final  . Albumin 07/25/2015 3.8  3.5 - 5.0 g/dL Final  . Calcium 07/25/2015 9.5  8.4 - 10.4 mg/dL Final  . Anion Gap 07/25/2015 12* 3 - 11 mEq/L Final  . EGFR 07/25/2015 56* >90 ml/min/1.73 m2 Final   eGFR is calculated using the CKD-EPI Creatinine Equation (2009)    RADIOGRAPHIC STUDIES: No results found.  ASSESSMENT/PLAN:    Otitis media of both ears Patient's son was diagnosed with strep throat approximately 10 days ago.  Patient states that she developed a severe sore throat and URI symptoms approximately one week ago.  She was seen in urgent care clinic; and her rapid strep test was negative.  There was no culture obtained.  However, patient was prescribed a Zithromax pack; which she completed today.  Patient states that her throat continues to be very sore to the colon and she has had extreme pain in her bilateral ears for the past few days.  She states that approximately 2 days ago, her bilateral ears began to drain and the ear pain slightly improved.  She continues to complain of decreased hearing to the left ear.  She complains of only occasional headache.  She denies any cough or shortness of breath.  She denies any recent fevers or chills.  Exam today reveals patient with significant nasal congestion; but no facial tenderness with palpation.  Posterior oropharynx with erythema; but no exudate.  Left ear with apparent eardrum rupture; and thick, purulent discharge.  Right ear with no visible TM; but no purulent fluid.  There is surrounding erythema in the ear canal, however.  Breath sounds are clear bilaterally.  Vital signs are stable and patient is afebrile today.  Patient will be prescribed Levaquin 750 mg on a daily  basis.  7 days; and ofloxacin antibiotic eardrops-10 drops to each ear twice daily for a total of 14 days.  Patient was also advised to avoid dunking her head underwater until her infection has completely cleared.  Patient will be scheduled to return to the symptom management clinic on Monday, 07/29/2015 for follow-up.  Patient was advised to go directly to the emergency department over the weekend with any worsening symptoms whatsoever.  If patient's otitis media does not resolve-patient will need to consider an ENT referral for further evaluation and management.       Breast cancer of upper-outer quadrant of right female breast [facility]) Patient continues to take Ibrance oral therapy as directed.  She takes the [place] 3 weeks on and one week off.  She was scheduled to initiate her next cycle of treatment on Tuesday, 07/30/2015.  However, since patient was just diagnosed with significant bilateral otitis media with tympanic membrane rupture.  Patient will hold the oral chemotherapy agent for approximately one week.  Patient also undergoes Xgeva injection; with the  last Xgeva injection on 06/27/2015.  Reviewed all findings with Dr. Virgie Dad nurse Mateo Flow, RN- who is aware of all details of today's visit.  Also, patient will return to the [facility] on 08/22/2015 for labs and her next Xgeva injection.   Patient stated understanding of all instructions; and was in agreement with this plan of care. The patient knows to call the clinic with any problems, questions or concerns.   Total time spent with patient was 25 minutes;  with greater than 75 percent of that time spent in face to face counseling regarding patient's symptoms,  and coordination of care and follow up.  Disclaimer:This dictation was prepared with Dragon/digital dictation along with [employer]. Any transcriptional errors that result from this process are unintentional.  Drue Second, NP 07/26/2015

## 2015-07-26 NOTE — Assessment & Plan Note (Signed)
Patient continues to take Ibrance oral therapy as directed.  She takes the [place] 3 weeks on and one week off.  She was scheduled to initiate her next cycle of treatment on Tuesday, 07/30/2015.  However, since patient was just diagnosed with significant bilateral otitis media with tympanic membrane rupture.  Patient will hold the oral chemotherapy agent for approximately one week.  Patient also undergoes Xgeva injection; with the last Xgeva injection on 06/27/2015.  Reviewed all findings with Dr. Virgie Dad nurse Mateo Flow, RN- who is aware of all details of today's visit.  Also, patient will return to the [facility] on 08/22/2015 for labs and her next Xgeva injection.

## 2015-07-26 NOTE — Assessment & Plan Note (Addendum)
Patient's son was diagnosed with strep throat approximately 10 days ago.  Patient states that she developed a severe sore throat and URI symptoms approximately one week ago.  She was seen in urgent care clinic; and her rapid strep test was negative.  There was no culture obtained.  However, patient was prescribed a Zithromax pack; which she completed today.  Patient states that her throat continues to be very sore to the colon and she has had extreme pain in her bilateral ears for the past few days.  She states that approximately 2 days ago, her bilateral ears began to drain and the ear pain slightly improved.  She continues to complain of decreased hearing to the left ear.  She complains of only occasional headache.  She denies any cough or shortness of breath.  She denies any recent fevers or chills.  Exam today reveals patient with significant nasal congestion; but no facial tenderness with palpation.  Posterior oropharynx with erythema; but no exudate.  Left ear with apparent eardrum rupture; and thick, purulent discharge.  Right ear with no visible TM; but no purulent fluid.  There is surrounding erythema in the ear canal, however.  Breath sounds are clear bilaterally.  Vital signs are stable and patient is afebrile today.  Patient will be prescribed Levaquin 750 mg on a daily basis.  7 days; and ofloxacin antibiotic eardrops-10 drops to each ear twice daily for a total of 14 days.  Patient was also advised to avoid dunking her head underwater until her infection has completely cleared.  Patient will be scheduled to return to the symptom management clinic on Monday, 07/29/2015 for follow-up.  Patient was advised to go directly to the emergency department over the weekend with any worsening symptoms whatsoever.  If patient's otitis media does not resolve-patient will need to consider an ENT referral for further evaluation and management.

## 2015-07-26 NOTE — Telephone Encounter (Signed)
s.w. pt and advised on 7.3 appt....ok and aware

## 2015-07-26 NOTE — Telephone Encounter (Signed)
Called patient to confirm that she will return to the symptom management clinic on Monday, 07/29/2015.  Also, patient states that she has 30 picked up her antibiotics and her eardrops.    Advised patient that had reviewed everything with Dr. Jana Hakim in his nurse Mateo Flow; and the plan is to hold patient's oral chemotherapy agent for approximately one week until her infection clears.  Patient stated understanding of all instructions was in agreement with this plan of care.

## 2015-07-26 NOTE — Telephone Encounter (Signed)
Late entry - per communication with pt on 07/25/2015 at 415 pm.  Pt came in to clinic for lab check- labs reviewed with pt and per conversation Cheryl Moore stated current status of URI with positive strep and on antibiotic. She states symptoms overall are not improving and " now my ears feel terrible ". No fevers at this time.  Cheryl Moore does not have a primary MD at this time- was seen by an Urgent Care for strep. Per concerns above in pt with stage IV breast cancer on oral chemo ( ibrance ) pt is to be seen on 6/30 in symptom management clinic.

## 2015-07-29 ENCOUNTER — Ambulatory Visit (HOSPITAL_BASED_OUTPATIENT_CLINIC_OR_DEPARTMENT_OTHER): Payer: BLUE CROSS/BLUE SHIELD | Admitting: Nurse Practitioner

## 2015-07-29 VITALS — BP 140/67 | HR 66 | Temp 97.5°F | Resp 19 | Ht 63.0 in | Wt 220.8 lb

## 2015-07-29 DIAGNOSIS — H6503 Acute serous otitis media, bilateral: Secondary | ICD-10-CM

## 2015-07-29 DIAGNOSIS — C50411 Malignant neoplasm of upper-outer quadrant of right female breast: Secondary | ICD-10-CM | POA: Diagnosis not present

## 2015-07-31 ENCOUNTER — Encounter: Payer: Self-pay | Admitting: Nurse Practitioner

## 2015-07-31 ENCOUNTER — Telehealth: Payer: Self-pay | Admitting: Oncology

## 2015-07-31 NOTE — Telephone Encounter (Signed)
per purgent order to sch pt lab for 7/10-cld pt and left message of time & date@3 :30

## 2015-07-31 NOTE — Assessment & Plan Note (Signed)
Patient's son was diagnosed with strep throat approximately 10 days ago.  Patient states that she developed a severe sore throat and URI symptoms approximately one week ago.  She was seen in urgent care clinic; and her rapid strep test was negative.  There was no culture obtained.  However, patient was prescribed a Zithromax pack; which she completed today.  Patient states that her throat continues to be very sore to the colon and she has had extreme pain in her bilateral ears for the past few days.  She states that approximately 2 days ago, her bilateral ears began to drain and the ear pain slightly improved.  She continues to complain of decreased hearing to the left ear.  She complains of only occasional headache.  She denies any cough or shortness of breath.  She denies any recent fevers or chills.  Exam today reveals patient with significant nasal congestion; but no facial tenderness with palpation.  Posterior oropharynx with erythema; but no exudate.  Left ear with apparent eardrum rupture; and thick, purulent discharge.  Right ear with no visible TM; but no purulent fluid.  There is surrounding erythema in the ear canal, however.  Breath sounds are clear bilaterally.  Vital signs are stable and patient is afebrile today.  Patient will be prescribed Levaquin 750 mg on a daily basis.  7 days; and ofloxacin antibiotic eardrops-10 drops to each ear twice daily for a total of 14 days.  Patient was also advised to avoid dunking her head underwater until her infection has completely cleared.  Patient will be scheduled to return to the symptom management clinic on Monday, 07/29/2015 for follow-up.  Patient was advised to go directly to the emergency department over the weekend with any worsening symptoms whatsoever.  If patient's otitis media does not resolve-patient will need to consider an ENT referral for further evaluation and  management. _____________________________________________________  Update: Patient in for recheck of her bilateral otitis media.  She states that she continues to take the Levaquin 750 mg on a daily basis; as well as the ofloxacin eardrops.  She states that both her ears are slowly improving.  She states that the hearing to right ear has almost completely returned to baseline.  She states that the left ear hearing is still decreased; but somewhat improved.  She denies any new symptoms.  She denies any recent fevers or chills.  Exam today reveals that the right ear canal continues with mild erythema; but is greatly improved since last exam.  The left ear continues with some mild erythema; but no purulent discharge whatsoever now.  Neither of the patient ears are tender during exam.  Patient was advised to continue taking her antibiotics and using her antibiotic eardrops as previously directed.  Advice patient would call her on Thursday morning, 08/01/2015 for follow-up.

## 2015-07-31 NOTE — Progress Notes (Signed)
SYMPTOM MANAGEMENT CLINIC    Chief Complaint: Bilateral ear pain, URI  HPI:  Cheryl Moore 55 y.o. female diagnosed with breast cancer.  Currently undergoing Ibrance oral therapy and Xgeva injections.  Update: Patient in for recheck of her bilateral otitis media.  She states that she continues to take the Levaquin 750 mg on a daily basis; as well as the ofloxacin eardrops.  She states that both her ears are slowly improving.  She states that the hearing to right ear has almost completely returned to baseline.  She states that the left ear hearing is still decreased; but somewhat improved.  She denies any new symptoms.  She denies any recent fevers or chills.  Exam today reveals that the right ear canal continues with mild erythema; but is greatly improved since last exam.  The left ear continues with some mild erythema; but no purulent discharge whatsoever now.  Neither of the patient ears are tender during exam.  Patient was advised to continue taking her antibiotics and using her antibiotic eardrops as previously directed.  Advice patient would call her on Thursday morning, 08/01/2015 for follow-up   No history exists.    Review of Systems  HENT: Positive for ear pain and hearing loss. Negative for congestion, ear discharge and sore throat.   Neurological: Negative for headaches.  All other systems reviewed and are negative.   Past Medical History  Diagnosis Date  . Anxiety   . Hot flashes   . History of radiation therapy 11/28/12-03/06/13    right breast/64.4Gy/ right hip=37.5Gy   . Obesity   . Personal history of colonic polyps - ssp and adenomas 09/19/2013  . 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 Elevated hemoglobin A1c 11/05/14    level 6.2  . Breast cancer [facility])     right    Past Surgical History  Procedure Laterality Date  . Cesarean section  2005  . Breast surgery Right     breast bx  . Breast biopsy Right 05/23/2012    Procedure: SKIN  PUNCH BIOPSY RIGHT BREAST;  Surgeon: [name], MD;  Location: [place];  Service: General;  Laterality: Right;  . Portacath placement Left 05/23/2012    Procedure: INSERTION PORT-A-CATH;  Surgeon: [name], MD;  Location: [place];  Service: General;  Laterality: Left;  . Total mastectomy Left 10/26/2012    Dr Donne Hazel  . Simple mastectomy with axillary sentinel node biopsy Left 10/26/2012    Procedure: TOTAL MASTECTOMY;  Surgeon: [name], MD;  Location: [place];  Service: General;  Laterality: Left;  Marland Kitchen Mastectomy modified radical Right 10/26/2012    Procedure: MASTECTOMY MODIFIED RADICAL;  Surgeon: [name], MD;  Location: [place];  Service: General;  Laterality: Right;  . Radiology with anesthesia N/A 08/24/2013    Procedure: MRI;  Surgeon: Medication Radiologist, MD;  Location: [place];  Service: Radiology;  Laterality: N/A;  . Port-a-cath removal Left 04/10/2014    Procedure: REMOVAL PORT-A-CATH;  Surgeon: [name], MD;  Location: [place];  Service: General;  Laterality: Left;  . Laparoscopic salpingo oopherectomy Bilateral 10/22/2014    Procedure: LAPAROSCOPIC SALPINGO OOPHORECTOMY;  Surgeon: Nunzio Cobbs, MD;  Location: [facility];  Service: Gynecology;  Laterality: Bilateral;  BMI 41.82    has Obesity; History of colonic polyps; Breast cancer of upper-outer quadrant of right female breast ([place]); Perioral dermatitis; Shingles; Genetic testing; and Otitis media of both ears on her problem  list.    is allergic to other and penicillins.    Medication List       This list is accurate as of: 07/29/15 11:59 PM.  Always use your most recent med list.               ALPRAZolam 0.25 MG tablet  Commonly known as:  XANAX  TAKE 1 TABLET BY MOUTH EVERY DAY AT BEDTIME AS NEEDED     CALCIUM PO  Take 1 tablet by mouth daily.     gabapentin 300 MG capsule  Commonly known as:  NEURONTIN  Take 1 capsule (300 mg total) by mouth at bedtime.     ibuprofen 800  MG tablet  Commonly known as:  ADVIL,MOTRIN  Take 1 tablet (800 mg total) by mouth every 8 (eight) hours as needed.     letrozole 2.5 MG tablet  Commonly known as:  FEMARA  Take 1 tablet (2.5 mg total) by mouth daily.     levofloxacin 750 MG tablet  Commonly known as:  LEVAQUIN  Take 1 tablet (750 mg total) by mouth daily.     multivitamin tablet  Take 1 tablet by mouth daily.     ofloxacin 0.3 % otic solution  Commonly known as:  FLOXIN OTIC  Place 10 drops into both ears 2 (two) times daily.     ondansetron 4 MG tablet  Commonly known as:  ZOFRAN  Take 1 tablet (4 mg total) by mouth daily as needed for nausea or vomiting.     palbociclib 125 MG capsule  Commonly known as:  IBRANCE  Take 1 capsule (125 mg total) by mouth daily with breakfast. Take 1 capsule daily for 21 days on 7 days off.     prochlorperazine 10 MG tablet  Commonly known as:  COMPAZINE  Take 1 tablet (10 mg total) by mouth every 6 (six) hours as needed for nausea or vomiting.     psyllium 58.6 % powder  Commonly known as:  METAMUCIL  Take 1 packet by mouth daily. Reported on 07/26/2015     traMADol 50 MG tablet  Commonly known as:  ULTRAM  TAKE 1 TABLET BY MOUTH EVERY 6 HOURS AS NEEDED FOR PAIN     valACYclovir 500 MG tablet  Commonly known as:  VALTREX  Take 1 tablet (500 mg total) by mouth 2 (two) times daily.     venlafaxine 75 MG tablet  Commonly known as:  EFFEXOR  Take 1 tablet (75 mg total) by mouth daily.     VITAMIN C PO  Take 1 tablet by mouth daily.         PHYSICAL EXAMINATION  Oncology Vitals 07/29/2015 07/26/2015  Height 160 cm 160 cm  Weight 100.154 kg 100.517 kg  Weight (lbs) 220 lbs 13 oz 221 lbs 10 oz  BMI (kg/m2) 39.11 kg/m2 39.25 kg/m2  Temp 97.5 98.6  Pulse 66 76  Resp 19 19  SpO2 100 98  BSA (m2) 2.11 m2 2.11 m2   BP Readings from Last 2 Encounters:  07/29/15 140/67  07/26/15 140/64    Physical Exam  Constitutional: She is oriented to person, place, and time  and well-developed, well-nourished, and in no distress.  HENT:  Head: Normocephalic and atraumatic.  Exam today reveals that the right ear canal continues with mild erythema; but is greatly improved since last exam.  The left ear continues with some mild erythema; but no purulent discharge whatsoever now.  Neither of the patient ears are  tender during exam.     Eyes: Conjunctivae and EOM are normal. Pupils are equal, round, and reactive to light. Right eye exhibits no discharge. Left eye exhibits no discharge. No scleral icterus.  Neck: Normal range of motion. Neck supple. No JVD present. No tracheal deviation present. No thyromegaly present.  Cardiovascular: Normal rate, regular rhythm, normal heart sounds and intact distal pulses.   Pulmonary/Chest: Effort normal and breath sounds normal. No respiratory distress. She has no wheezes. She has no rales. She exhibits no tenderness.  Abdominal: Soft. Bowel sounds are normal. She exhibits no distension and no mass. There is no tenderness. There is no rebound and no guarding.  Musculoskeletal: Normal range of motion. She exhibits no edema or tenderness.  Lymphadenopathy:    She has no cervical adenopathy.  Neurological: She is alert and oriented to person, place, and time. Gait normal.  Skin: Skin is warm and dry. No rash noted. No erythema. No pallor.  Psychiatric: Affect normal.  Nursing note and vitals reviewed.   LABORATORY DATA:. No visits with results within 3 Day(s) from this visit. Latest known visit with results is:  Appointment on 07/25/2015  Component Date Value Ref Range Status  . WBC 07/25/2015 4.4  3.9 - 10.3 10e3/uL Final  . NEUT# 07/25/2015 3.1  1.5 - 6.5 10e3/uL Final  . HGB 07/25/2015 13.0  11.6 - 15.9 g/dL Final  . HCT 07/25/2015 35.8  34.8 - 46.6 % Final  . Platelets 07/25/2015 192  145 - 400 10e3/uL Final  . MCV 07/25/2015 100.6  79.5 - 101.0 fL Final  . MCH 07/25/2015 36.5* 25.1 - 34.0 pg Final  . MCHC 07/25/2015  36.3* 31.5 - 36.0 g/dL Final  . RBC 07/25/2015 3.56* 3.70 - 5.45 10e6/uL Final  . RDW 07/25/2015 14.3  11.2 - 14.5 % Final  . lymph# 07/25/2015 1.1  0.9 - 3.3 10e3/uL Final  . MONO# 07/25/2015 0.2  0.1 - 0.9 10e3/uL Final  . Eosinophils Absolute 07/25/2015 0.0  0.0 - 0.5 10e3/uL Final  . Basophils Absolute 07/25/2015 0.0  0.0 - 0.1 10e3/uL Final  . NEUT% 07/25/2015 69.7  38.4 - 76.8 % Final  . LYMPH% 07/25/2015 24.1  14.0 - 49.7 % Final  . MONO% 07/25/2015 5.5  0.0 - 14.0 % Final  . EOS% 07/25/2015 0.2  0.0 - 7.0 % Final  . BASO% 07/25/2015 0.5  0.0 - 2.0 % Final  . Sodium 07/25/2015 139  136 - 145 mEq/L Final  . Potassium 07/25/2015 3.8  3.5 - 5.1 mEq/L Final  . Chloride 07/25/2015 107  98 - 109 mEq/L Final  . CO2 07/25/2015 20* 22 - 29 mEq/L Final  . Glucose 07/25/2015 221* 70 - 140 mg/dl Final   Glucose reference range is for nonfasting patients. Fasting glucose reference range is 70- 100.  Marland Kitchen BUN 07/25/2015 13.0  7.0 - 26.0 mg/dL Final  . Creatinine 07/25/2015 1.1  0.6 - 1.1 mg/dL Final  . Total Bilirubin 07/25/2015 0.47  0.20 - 1.20 mg/dL Final  . Alkaline Phosphatase 07/25/2015 77  40 - 150 U/L Final  . AST 07/25/2015 19  5 - 34 U/L Final  . ALT 07/25/2015 44  0 - 55 U/L Final  . Total Protein 07/25/2015 7.3  6.4 - 8.3 g/dL Final  . Albumin 07/25/2015 3.8  3.5 - 5.0 g/dL Final  . Calcium 07/25/2015 9.5  8.4 - 10.4 mg/dL Final  . Anion Gap 07/25/2015 12* 3 - 11 mEq/L Final  . EGFR 07/25/2015  56* >90 ml/min/1.73 m2 Final   eGFR is calculated using the CKD-EPI Creatinine Equation (2009)    RADIOGRAPHIC STUDIES: No results found.  ASSESSMENT/PLAN:    Otitis media of both ears Patient's son was diagnosed with strep throat approximately 10 days ago.  Patient states that she developed a severe sore throat and URI symptoms approximately one week ago.  She was seen in urgent care clinic; and her rapid strep test was negative.  There was no culture obtained.  However, patient was  prescribed a Zithromax pack; which she completed today.  Patient states that her throat continues to be very sore to the colon and she has had extreme pain in her bilateral ears for the past few days.  She states that approximately 2 days ago, her bilateral ears began to drain and the ear pain slightly improved.  She continues to complain of decreased hearing to the left ear.  She complains of only occasional headache.  She denies any cough or shortness of breath.  She denies any recent fevers or chills.  Exam today reveals patient with significant nasal congestion; but no facial tenderness with palpation.  Posterior oropharynx with erythema; but no exudate.  Left ear with apparent eardrum rupture; and thick, purulent discharge.  Right ear with no visible TM; but no purulent fluid.  There is surrounding erythema in the ear canal, however.  Breath sounds are clear bilaterally.  Vital signs are stable and patient is afebrile today.  Patient will be prescribed Levaquin 750 mg on a daily basis.  7 days; and ofloxacin antibiotic eardrops-10 drops to each ear twice daily for a total of 14 days.  Patient was also advised to avoid dunking her head underwater until her infection has completely cleared.  Patient will be scheduled to return to the symptom management clinic on Monday, 07/29/2015 for follow-up.  Patient was advised to go directly to the emergency department over the weekend with any worsening symptoms whatsoever.  If patient's otitis media does not resolve-patient will need to consider an ENT referral for further evaluation and management. _____________________________________________________  Update: Patient in for recheck of her bilateral otitis media.  She states that she continues to take the Levaquin 750 mg on a daily basis; as well as the ofloxacin eardrops.  She states that both her ears are slowly improving.  She states that the hearing to right ear has almost completely returned to  baseline.  She states that the left ear hearing is still decreased; but somewhat improved.  She denies any new symptoms.  She denies any recent fevers or chills.  Exam today reveals that the right ear canal continues with mild erythema; but is greatly improved since last exam.  The left ear continues with some mild erythema; but no purulent discharge whatsoever now.  Neither of the patient ears are tender during exam.  Patient was advised to continue taking her antibiotics and using her antibiotic eardrops as previously directed.  Advice patient would call her on Thursday morning, 08/01/2015 for follow-up.      Breast cancer of upper-outer quadrant of right female breast [facility]) Patient continues to take Ibrance oral therapy as directed.  She takes the [place] 3 weeks on and one week off.  She was scheduled to initiate her next cycle of treatment on Tuesday, 07/30/2015.  However, since patient was just diagnosed with significant bilateral otitis media with tympanic membrane rupture.  Patient will hold the oral chemotherapy agent for approximately one week.  Patient also undergoes  Xgeva injection; with the last Xgeva injection on 06/27/2015.  Reviewed all findings with Dr. Virgie Dad nurse Mateo Flow, RN- who is aware of all details of today's visit.  Also, patient will return to the [facility] on 08/22/2015 for labs and her next Xgeva injection. ___________________________________  Update: Patient in for recheck of her ears today; it does appear that both ear  infections are steadily improving.  Patient will continue to hold her oral chemotherapy until later this week to ensure that ear infection is resolved.     Patient stated understanding of all instructions; and was in agreement with this plan of care. The patient knows to call the clinic with any problems, questions or concerns.   Total time spent with patient was 25 minutes;  with greater than 75 percent of that time spent in face  to face counseling regarding patient's symptoms,  and coordination of care and follow up.  Disclaimer:This dictation was prepared with Dragon/digital dictation along with [employer]. Any transcriptional errors that result from this process are unintentional.  Drue Second, NP 07/31/2015

## 2015-07-31 NOTE — Assessment & Plan Note (Signed)
Patient continues to take Ibrance oral therapy as directed.  She takes the [place] 3 weeks on and one week off.  She was scheduled to initiate her next cycle of treatment on Tuesday, 07/30/2015.  However, since patient was just diagnosed with significant bilateral otitis media with tympanic membrane rupture.  Patient will hold the oral chemotherapy agent for approximately one week.  Patient also undergoes Xgeva injection; with the last Xgeva injection on 06/27/2015.  Reviewed all findings with Dr. Virgie Dad nurse Mateo Flow, RN- who is aware of all details of today's visit.  Also, patient will return to the [facility] on 08/22/2015 for labs and her next Xgeva injection. ___________________________________  Update: Patient in for recheck of her ears today; it does appear that both ear  infections are steadily improving.  Patient will continue to hold her oral chemotherapy until later this week to ensure that ear infection is resolved.

## 2015-08-01 ENCOUNTER — Telehealth: Payer: Self-pay | Admitting: Nurse Practitioner

## 2015-08-01 NOTE — Telephone Encounter (Signed)
Called to check in with patient again regarding her bilateral otitis media.  She states that her ears are slowly feeling better.  She states that her right ear still has some slight decreased hearing; but she continues with minimal to no hearing from the left ear.  She states that her last antibiotics will be taken today.  This provider then call Dr. Virgie Dad nurse; and reviewed my telephone conversation with Dr. Virgie Dad nurse.  The plan is for the patient to return to the [facility] for repeat labs on Monday, 08/05/2015.  Dr. Jana Hakim, will review patient's labs on that day; and determine if patient should hold the Ibrance for another week to allow for further recovery of her otitis media.  Also,-will need to consider follow-up with an ENT referral if otitis media does not resolve and/or hearing does not slowly improve.    Val, RN  confirmed that she will communicate this plan with the patient.

## 2015-08-05 ENCOUNTER — Other Ambulatory Visit (HOSPITAL_BASED_OUTPATIENT_CLINIC_OR_DEPARTMENT_OTHER): Payer: BLUE CROSS/BLUE SHIELD

## 2015-08-05 DIAGNOSIS — C50411 Malignant neoplasm of upper-outer quadrant of right female breast: Secondary | ICD-10-CM | POA: Diagnosis not present

## 2015-08-05 LAB — COMPREHENSIVE METABOLIC PANEL
ALBUMIN: 3.7 g/dL (ref 3.5–5.0)
ALK PHOS: 70 U/L (ref 40–150)
ALT: 43 U/L (ref 0–55)
AST: 27 U/L (ref 5–34)
Anion Gap: 13 mEq/L — ABNORMAL HIGH (ref 3–11)
BUN: 14.9 mg/dL (ref 7.0–26.0)
CO2: 20 meq/L — AB (ref 22–29)
CREATININE: 1 mg/dL (ref 0.6–1.1)
Calcium: 9.4 mg/dL (ref 8.4–10.4)
Chloride: 108 mEq/L (ref 98–109)
EGFR: 66 mL/min/{1.73_m2} — AB (ref >90)
GLUCOSE: 188 mg/dL — AB (ref 70–140)
POTASSIUM: 3.8 meq/L (ref 3.5–5.1)
SODIUM: 141 meq/L (ref 136–145)
Total Protein: 7 g/dL (ref 6.4–8.3)

## 2015-08-05 LAB — CBC WITH DIFFERENTIAL/PLATELET
BASO%: 1 % (ref 0.0–2.0)
BASOS ABS: 0.1 10*3/uL (ref 0.0–0.1)
EOS%: 0.2 % (ref 0.0–7.0)
Eosinophils Absolute: 0 10*3/uL (ref 0.0–0.5)
HCT: 34.4 % — ABNORMAL LOW (ref 34.8–46.6)
HGB: 12.3 g/dL (ref 11.6–15.9)
LYMPH%: 36.5 % (ref 14.0–49.7)
MCH: 35.7 pg — AB (ref 25.1–34.0)
MCHC: 35.8 g/dL (ref 31.5–36.0)
MCV: 99.7 fL (ref 79.5–101.0)
MONO#: 0.4 10*3/uL (ref 0.1–0.9)
MONO%: 8 % (ref 0.0–14.0)
NEUT#: 2.7 10*3/uL (ref 1.5–6.5)
NEUT%: 54.3 % (ref 38.4–76.8)
Platelets: 282 10*3/uL (ref 145–400)
RBC: 3.45 10*6/uL — AB (ref 3.70–5.45)
RDW: 14.5 % (ref 11.2–14.5)
WBC: 5 10*3/uL (ref 3.9–10.3)
lymph#: 1.8 10*3/uL (ref 0.9–3.3)
nRBC: 2 % — ABNORMAL HIGH (ref 0–0)

## 2015-08-22 ENCOUNTER — Other Ambulatory Visit (HOSPITAL_BASED_OUTPATIENT_CLINIC_OR_DEPARTMENT_OTHER): Payer: BLUE CROSS/BLUE SHIELD

## 2015-08-22 ENCOUNTER — Ambulatory Visit (HOSPITAL_BASED_OUTPATIENT_CLINIC_OR_DEPARTMENT_OTHER): Payer: BLUE CROSS/BLUE SHIELD

## 2015-08-22 ENCOUNTER — Telehealth: Payer: Self-pay | Admitting: *Deleted

## 2015-08-22 VITALS — BP 125/73 | HR 67 | Temp 98.6°F | Resp 20

## 2015-08-22 DIAGNOSIS — C50411 Malignant neoplasm of upper-outer quadrant of right female breast: Secondary | ICD-10-CM | POA: Diagnosis not present

## 2015-08-22 DIAGNOSIS — C7951 Secondary malignant neoplasm of bone: Secondary | ICD-10-CM

## 2015-08-22 LAB — CBC WITH DIFFERENTIAL/PLATELET
BASO%: 0.4 % (ref 0.0–2.0)
Basophils Absolute: 0 10*3/uL (ref 0.0–0.1)
EOS%: 2.2 % (ref 0.0–7.0)
Eosinophils Absolute: 0.1 10*3/uL (ref 0.0–0.5)
HCT: 36.3 % (ref 34.8–46.6)
HGB: 12.8 g/dL (ref 11.6–15.9)
LYMPH%: 29.9 % (ref 14.0–49.7)
MCH: 35.7 pg — AB (ref 25.1–34.0)
MCHC: 35.3 g/dL (ref 31.5–36.0)
MCV: 101.1 fL — ABNORMAL HIGH (ref 79.5–101.0)
MONO#: 0.4 10*3/uL (ref 0.1–0.9)
MONO%: 7.8 % (ref 0.0–14.0)
NEUT#: 3.3 10*3/uL (ref 1.5–6.5)
NEUT%: 59.7 % (ref 38.4–76.8)
Platelets: 225 10*3/uL (ref 145–400)
RBC: 3.59 10*6/uL — AB (ref 3.70–5.45)
RDW: 14.1 % (ref 11.2–14.5)
WBC: 5.5 10*3/uL (ref 3.9–10.3)
lymph#: 1.7 10*3/uL (ref 0.9–3.3)

## 2015-08-22 LAB — COMPREHENSIVE METABOLIC PANEL
ALT: 40 U/L (ref 0–55)
AST: 23 U/L (ref 5–34)
Albumin: 3.7 g/dL (ref 3.5–5.0)
Alkaline Phosphatase: 60 U/L (ref 40–150)
Anion Gap: 9 mEq/L (ref 3–11)
BUN: 14.9 mg/dL (ref 7.0–26.0)
CHLORIDE: 110 meq/L — AB (ref 98–109)
CO2: 20 meq/L — AB (ref 22–29)
Calcium: 9.6 mg/dL (ref 8.4–10.4)
Creatinine: 1 mg/dL (ref 0.6–1.1)
EGFR: 66 mL/min/{1.73_m2} — ABNORMAL LOW (ref >90)
GLUCOSE: 152 mg/dL — AB (ref 70–140)
POTASSIUM: 4.2 meq/L (ref 3.5–5.1)
SODIUM: 139 meq/L (ref 136–145)
Total Bilirubin: <0.3 mg/dL (ref 0.20–1.20)
Total Protein: 6.9 g/dL (ref 6.4–8.3)

## 2015-08-22 MED ORDER — DENOSUMAB 120 MG/1.7ML ~~LOC~~ SOLN
120.0000 mg | Freq: Once | SUBCUTANEOUS | Status: AC
  Administered 2015-08-22: 120 mg via SUBCUTANEOUS
  Filled 2015-08-22: qty 1.7

## 2015-08-22 NOTE — Patient Instructions (Signed)
Denosumab injection What is this medicine? DENOSUMAB (den oh sue mab) slows bone breakdown. Prolia is used to treat osteoporosis in women after menopause and in men. Xgeva is used to prevent bone fractures and other bone problems caused by cancer bone metastases. Xgeva is also used to treat giant cell tumor of the bone. This medicine may be used for other purposes; ask your health care provider or pharmacist if you have questions. COMMON BRAND NAME(S): Prolia, XGEVA What should I tell my health care provider before I take this medicine? They need to know if you have any of these conditions: -dental disease -eczema -infection or history of infections -kidney disease or on dialysis -low blood calcium or vitamin D -malabsorption syndrome -scheduled to have surgery or tooth extraction -taking medicine that contains denosumab -thyroid or parathyroid disease -an unusual reaction to denosumab, other medicines, foods, dyes, or preservatives -pregnant or trying to get pregnant -breast-feeding How should I use this medicine? This medicine is for injection under the skin. It is given by a health care professional in a hospital or clinic setting. If you are getting Prolia, a special MedGuide will be given to you by the pharmacist with each prescription and refill. Be sure to read this information carefully each time. For Prolia, talk to your pediatrician regarding the use of this medicine in children. Special care may be needed. For Xgeva, talk to your pediatrician regarding the use of this medicine in children. While this drug may be prescribed for children as young as 13 years for selected conditions, precautions do apply. Overdosage: If you think you've taken too much of this medicine contact a poison control center or emergency room at once. Overdosage: If you think you have taken too much of this medicine contact a poison control center or emergency room at once. NOTE: This medicine is only for  you. Do not share this medicine with others. What if I miss a dose? It is important not to miss your dose. Call your doctor or health care professional if you are unable to keep an appointment. What may interact with this medicine? Do not take this medicine with any of the following medications: -other medicines containing denosumab This medicine may also interact with the following medications: -medicines that suppress the immune system -medicines that treat cancer -steroid medicines like prednisone or cortisone This list may not describe all possible interactions. Give your health care provider a list of all the medicines, herbs, non-prescription drugs, or dietary supplements you use. Also tell them if you smoke, drink alcohol, or use illegal drugs. Some items may interact with your medicine. What should I watch for while using this medicine? Visit your doctor or health care professional for regular checks on your progress. Your doctor or health care professional may order blood tests and other tests to see how you are doing. Call your doctor or health care professional if you get a cold or other infection while receiving this medicine. Do not treat yourself. This medicine may decrease your body's ability to fight infection. You should make sure you get enough calcium and vitamin D while you are taking this medicine, unless your doctor tells you not to. Discuss the foods you eat and the vitamins you take with your health care professional. See your dentist regularly. Brush and floss your teeth as directed. Before you have any dental work done, tell your dentist you are receiving this medicine. Do not become pregnant while taking this medicine or for 5 months after stopping   it. Women should inform their doctor if they wish to become pregnant or think they might be pregnant. There is a potential for serious side effects to an unborn child. Talk to your health care professional or pharmacist for more  information. What side effects may I notice from receiving this medicine? Side effects that you should report to your doctor or health care professional as soon as possible: -allergic reactions like skin rash, itching or hives, swelling of the face, lips, or tongue -breathing problems -chest pain -fast, irregular heartbeat -feeling faint or lightheaded, falls -fever, chills, or any other sign of infection -muscle spasms, tightening, or twitches -numbness or tingling -skin blisters or bumps, or is dry, peels, or red -slow healing or unexplained pain in the mouth or jaw -unusual bleeding or bruising Side effects that usually do not require medical attention (Report these to your doctor or health care professional if they continue or are bothersome.): -muscle pain -stomach upset, gas This list may not describe all possible side effects. Call your doctor for medical advice about side effects. You may report side effects to FDA at 1-800-FDA-1088. Where should I keep my medicine? This medicine is only given in a clinic, doctor's office, or other health care setting and will not be stored at home. NOTE: This sheet is a summary. It may not cover all possible information. If you have questions about this medicine, talk to your doctor, pharmacist, or health care provider.  2015, Elsevier/Gold Standard. (2011-07-13 12:37:47) Goserelin injection What is this medicine? GOSERELIN (GOE se rel in) is similar to a hormone found in the body. It lowers the amount of sex hormones that the body makes. Men will have lower testosterone levels and women will have lower estrogen levels while taking this medicine. In men, this medicine is used to treat prostate cancer; the injection is either given once per month or once every 12 weeks. A once per month injection (only) is used to treat women with endometriosis, dysfunctional uterine bleeding, or advanced breast cancer. This medicine may be used for other purposes;  ask your health care provider or pharmacist if you have questions. COMMON BRAND NAME(S): Zoladex What should I tell my health care provider before I take this medicine? They need to know if you have any of these conditions (some only apply to women): -diabetes -heart disease or previous heart attack -high blood pressure -high cholesterol -kidney disease -osteoporosis or low bone density -problems passing urine -spinal cord injury -stroke -tobacco smoker -an unusual or allergic reaction to goserelin, hormone therapy, other medicines, foods, dyes, or preservatives -pregnant or trying to get pregnant -breast-feeding How should I use this medicine? This medicine is for injection under the skin. It is given by a health care professional in a hospital or clinic setting. Men receive this injection once every 4 weeks or once every 12 weeks. Women will only receive the once every 4 weeks injection. Talk to your pediatrician regarding the use of this medicine in children. Special care may be needed. Overdosage: If you think you have taken too much of this medicine contact a poison control center or emergency room at once. NOTE: This medicine is only for you. Do not share this medicine with others. What if I miss a dose? It is important not to miss your dose. Call your doctor or health care professional if you are unable to keep an appointment. What may interact with this medicine? -female hormones like estrogen -herbal or dietary supplements like black cohosh, chasteberry,   or DHEA -female hormones like testosterone -prasterone This list may not describe all possible interactions. Give your health care provider a list of all the medicines, herbs, non-prescription drugs, or dietary supplements you use. Also tell them if you smoke, drink alcohol, or use illegal drugs. Some items may interact with your medicine. What should I watch for while using this medicine? Visit your doctor or health care  professional for regular checks on your progress. Your symptoms may appear to get worse during the first weeks of this therapy. Tell your doctor or healthcare professional if your symptoms do not start to get better or if they get worse after this time. Your bones may get weaker if you take this medicine for a long time. If you smoke or frequently drink alcohol you may increase your risk of bone loss. A family history of osteoporosis, chronic use of drugs for seizures (convulsions), or corticosteroids can also increase your risk of bone loss. Talk to your doctor about how to keep your bones strong. This medicine should stop regular monthly menstration in women. Tell your doctor if you continue to menstrate. Women should not become pregnant while taking this medicine or for 12 weeks after stopping this medicine. Women should inform their doctor if they wish to become pregnant or think they might be pregnant. There is a potential for serious side effects to an unborn child. Talk to your health care professional or pharmacist for more information. Do not breast-feed an infant while taking this medicine. Men should inform their doctors if they wish to father a child. This medicine may lower sperm counts. Talk to your health care professional or pharmacist for more information. What side effects may I notice from receiving this medicine? Side effects that you should report to your doctor or health care professional as soon as possible: -allergic reactions like skin rash, itching or hives, swelling of the face, lips, or tongue -bone pain -breathing problems -changes in vision -chest pain -feeling faint or lightheaded, falls -fever, chills -pain, swelling, warmth in the leg -pain, tingling, numbness in the hands or feet -signs and symptoms of low blood pressure like dizziness; feeling faint or lightheaded, falls; unusually weak or tired -stomach pain -swelling of the ankles, feet, hands -trouble passing  urine or change in the amount of urine -unusually high or low blood pressure -unusually weak or tired Side effects that usually do not require medical attention (report to your doctor or health care professional if they continue or are bothersome): -change in sex drive or performance -changes in breast size in both males and females -changes in emotions or moods -headache -hot flashes -irritation at site where injected -loss of appetite -skin problems like acne, dry skin -vaginal dryness This list may not describe all possible side effects. Call your doctor for medical advice about side effects. You may report side effects to FDA at 1-800-FDA-1088. Where should I keep my medicine? This drug is given in a hospital or clinic and will not be stored at home. NOTE: This sheet is a summary. It may not cover all possible information. If you have questions about this medicine, talk to your doctor, pharmacist, or health care provider.  2015, Elsevier/Gold Standard. (2013-03-21 11:10:35)  

## 2015-08-23 NOTE — Telephone Encounter (Signed)
Pt came in for lab - per MD review pt should resume Ibrance therapy.  Prescription faxed to [facility]

## 2015-09-18 ENCOUNTER — Other Ambulatory Visit: Payer: Self-pay | Admitting: *Deleted

## 2015-09-18 DIAGNOSIS — C50411 Malignant neoplasm of upper-outer quadrant of right female breast: Secondary | ICD-10-CM

## 2015-09-19 ENCOUNTER — Other Ambulatory Visit (HOSPITAL_BASED_OUTPATIENT_CLINIC_OR_DEPARTMENT_OTHER): Payer: BLUE CROSS/BLUE SHIELD

## 2015-09-19 DIAGNOSIS — C50411 Malignant neoplasm of upper-outer quadrant of right female breast: Secondary | ICD-10-CM | POA: Diagnosis not present

## 2015-09-19 LAB — CBC WITH DIFFERENTIAL/PLATELET
BASO%: 0.3 % (ref 0.0–2.0)
Basophils Absolute: 0 10*3/uL (ref 0.0–0.1)
EOS%: 0.6 % (ref 0.0–7.0)
Eosinophils Absolute: 0 10*3/uL (ref 0.0–0.5)
HEMATOCRIT: 35.7 % (ref 34.8–46.6)
HEMOGLOBIN: 12.5 g/dL (ref 11.6–15.9)
LYMPH#: 1.4 10*3/uL (ref 0.9–3.3)
LYMPH%: 42.3 % (ref 14.0–49.7)
MCH: 35.7 pg — ABNORMAL HIGH (ref 25.1–34.0)
MCHC: 35 g/dL (ref 31.5–36.0)
MCV: 102 fL — ABNORMAL HIGH (ref 79.5–101.0)
MONO#: 0.3 10*3/uL (ref 0.1–0.9)
MONO%: 9.2 % (ref 0.0–14.0)
NEUT%: 47.6 % (ref 38.4–76.8)
NEUTROS ABS: 1.6 10*3/uL (ref 1.5–6.5)
PLATELETS: 173 10*3/uL (ref 145–400)
RBC: 3.5 10*6/uL — ABNORMAL LOW (ref 3.70–5.45)
RDW: 13.7 % (ref 11.2–14.5)
WBC: 3.4 10*3/uL — ABNORMAL LOW (ref 3.9–10.3)

## 2015-09-19 LAB — COMPREHENSIVE METABOLIC PANEL
ALT: 29 U/L (ref 0–55)
ANION GAP: 8 meq/L (ref 3–11)
AST: 19 U/L (ref 5–34)
Albumin: 3.6 g/dL (ref 3.5–5.0)
Alkaline Phosphatase: 55 U/L (ref 40–150)
BUN: 13.4 mg/dL (ref 7.0–26.0)
CALCIUM: 9 mg/dL (ref 8.4–10.4)
CO2: 23 mEq/L (ref 22–29)
CREATININE: 1.1 mg/dL (ref 0.6–1.1)
Chloride: 105 mEq/L (ref 98–109)
EGFR: 58 mL/min/{1.73_m2} — ABNORMAL LOW (ref >90)
Glucose: 113 mg/dl (ref 70–140)
Potassium: 3.8 mEq/L (ref 3.5–5.1)
Sodium: 137 mEq/L (ref 136–145)
TOTAL PROTEIN: 7 g/dL (ref 6.4–8.3)

## 2015-09-21 ENCOUNTER — Other Ambulatory Visit: Payer: Self-pay | Admitting: Oncology

## 2015-09-21 DIAGNOSIS — C50411 Malignant neoplasm of upper-outer quadrant of right female breast: Secondary | ICD-10-CM

## 2015-09-22 ENCOUNTER — Other Ambulatory Visit: Payer: Self-pay | Admitting: Oncology

## 2015-09-22 DIAGNOSIS — C50411 Malignant neoplasm of upper-outer quadrant of right female breast: Secondary | ICD-10-CM

## 2015-10-01 ENCOUNTER — Other Ambulatory Visit: Payer: Self-pay | Admitting: Nurse Practitioner

## 2015-10-01 DIAGNOSIS — C50411 Malignant neoplasm of upper-outer quadrant of right female breast: Secondary | ICD-10-CM

## 2015-10-02 ENCOUNTER — Other Ambulatory Visit: Payer: BLUE CROSS/BLUE SHIELD

## 2015-10-03 ENCOUNTER — Telehealth: Payer: Self-pay

## 2015-10-03 DIAGNOSIS — C50411 Malignant neoplasm of upper-outer quadrant of right female breast: Secondary | ICD-10-CM

## 2015-10-03 MED ORDER — PALBOCICLIB 125 MG PO CAPS: 125.0000 mg | ORAL_CAPSULE | Freq: Every day | ORAL | 0 refills | Status: DC

## 2015-10-03 NOTE — Telephone Encounter (Signed)
lvm she missed labs appt yesterday, please call and schedule for tomorrow. We need these for Ibrance monitoring.

## 2015-10-03 NOTE — Addendum Note (Signed)
Addended by: Janace Hoard on: 10/03/2015 11:27 AM   Modules accepted: Orders

## 2015-10-04 ENCOUNTER — Telehealth: Payer: Self-pay | Admitting: Oncology

## 2015-10-04 ENCOUNTER — Other Ambulatory Visit (HOSPITAL_BASED_OUTPATIENT_CLINIC_OR_DEPARTMENT_OTHER): Payer: BLUE CROSS/BLUE SHIELD

## 2015-10-04 DIAGNOSIS — C50411 Malignant neoplasm of upper-outer quadrant of right female breast: Secondary | ICD-10-CM | POA: Diagnosis not present

## 2015-10-04 LAB — COMPREHENSIVE METABOLIC PANEL
ALBUMIN: 3.6 g/dL (ref 3.5–5.0)
ALK PHOS: 51 U/L (ref 40–150)
ALT: 31 U/L (ref 0–55)
AST: 20 U/L (ref 5–34)
Anion Gap: 10 mEq/L (ref 3–11)
BUN: 15 mg/dL (ref 7.0–26.0)
CALCIUM: 8.8 mg/dL (ref 8.4–10.4)
CO2: 22 mEq/L (ref 22–29)
Chloride: 108 mEq/L (ref 98–109)
Creatinine: 0.9 mg/dL (ref 0.6–1.1)
EGFR: 74 mL/min/{1.73_m2} — ABNORMAL LOW (ref >90)
GLUCOSE: 131 mg/dL (ref 70–140)
Potassium: 4.2 mEq/L (ref 3.5–5.1)
SODIUM: 139 meq/L (ref 136–145)
TOTAL PROTEIN: 6.9 g/dL (ref 6.4–8.3)

## 2015-10-04 LAB — CBC WITH DIFFERENTIAL/PLATELET
BASO%: 1.3 % (ref 0.0–2.0)
BASOS ABS: 0 10*3/uL (ref 0.0–0.1)
EOS ABS: 0 10*3/uL (ref 0.0–0.5)
EOS%: 0.3 % (ref 0.0–7.0)
HCT: 37.9 % (ref 34.8–46.6)
HEMOGLOBIN: 13.1 g/dL (ref 11.6–15.9)
LYMPH%: 48.1 % (ref 14.0–49.7)
MCH: 35.7 pg — ABNORMAL HIGH (ref 25.1–34.0)
MCHC: 34.5 g/dL (ref 31.5–36.0)
MCV: 103.6 fL — AB (ref 79.5–101.0)
MONO#: 0.5 10*3/uL (ref 0.1–0.9)
MONO%: 13.6 % (ref 0.0–14.0)
NEUT#: 1.2 10*3/uL — ABNORMAL LOW (ref 1.5–6.5)
NEUT%: 36.7 % — ABNORMAL LOW (ref 38.4–76.8)
Platelets: 236 10*3/uL (ref 145–400)
RBC: 3.66 10*6/uL — ABNORMAL LOW (ref 3.70–5.45)
RDW: 14.6 % — AB (ref 11.2–14.5)
WBC: 3.4 10*3/uL — ABNORMAL LOW (ref 3.9–10.3)
lymph#: 1.6 10*3/uL (ref 0.9–3.3)

## 2015-10-04 NOTE — Telephone Encounter (Signed)
10/02/2015 Appointment rescheduled to 10/04/2015 per patient request. Patient missed 09/06 appointment and called to reschedule for 09/08.

## 2015-10-17 ENCOUNTER — Ambulatory Visit (HOSPITAL_BASED_OUTPATIENT_CLINIC_OR_DEPARTMENT_OTHER): Payer: BLUE CROSS/BLUE SHIELD

## 2015-10-17 ENCOUNTER — Other Ambulatory Visit (HOSPITAL_BASED_OUTPATIENT_CLINIC_OR_DEPARTMENT_OTHER): Payer: BLUE CROSS/BLUE SHIELD

## 2015-10-17 VITALS — BP 126/59 | HR 67 | Temp 98.0°F | Resp 18

## 2015-10-17 DIAGNOSIS — C7951 Secondary malignant neoplasm of bone: Secondary | ICD-10-CM | POA: Diagnosis not present

## 2015-10-17 DIAGNOSIS — C50411 Malignant neoplasm of upper-outer quadrant of right female breast: Secondary | ICD-10-CM

## 2015-10-17 LAB — CBC WITH DIFFERENTIAL/PLATELET
BASO%: 0.4 % (ref 0.0–2.0)
BASOS ABS: 0 10*3/uL (ref 0.0–0.1)
EOS ABS: 0 10*3/uL (ref 0.0–0.5)
EOS%: 0.4 % (ref 0.0–7.0)
HEMATOCRIT: 39.2 % (ref 34.8–46.6)
HEMOGLOBIN: 14 g/dL (ref 11.6–15.9)
LYMPH#: 1.8 10*3/uL (ref 0.9–3.3)
LYMPH%: 39.5 % (ref 14.0–49.7)
MCH: 35.6 pg — AB (ref 25.1–34.0)
MCHC: 35.7 g/dL (ref 31.5–36.0)
MCV: 99.7 fL (ref 79.5–101.0)
MONO#: 0.3 10*3/uL (ref 0.1–0.9)
MONO%: 5.7 % (ref 0.0–14.0)
NEUT#: 2.5 10*3/uL (ref 1.5–6.5)
NEUT%: 54 % (ref 38.4–76.8)
Platelets: 319 10*3/uL (ref 145–400)
RBC: 3.93 10*6/uL (ref 3.70–5.45)
RDW: 13.6 % (ref 11.2–14.5)
WBC: 4.6 10*3/uL (ref 3.9–10.3)

## 2015-10-17 MED ORDER — DENOSUMAB 120 MG/1.7ML ~~LOC~~ SOLN
120.0000 mg | Freq: Once | SUBCUTANEOUS | Status: AC
  Administered 2015-10-17: 120 mg via SUBCUTANEOUS
  Filled 2015-10-17: qty 1.7

## 2015-10-17 NOTE — Progress Notes (Signed)
Pt reports she has several patchy, scaly areas she would like evaluated by dermatology. This has been on-going. Areas went away while on IV chemo and have returned. Asks if Dr. Jana Hakim would recommend one. Discussed with Val, RN: will make a referral to dermatology. Pt appreciative, understands to expect call from derm office in next few weeks.

## 2015-10-18 ENCOUNTER — Other Ambulatory Visit: Payer: Self-pay | Admitting: *Deleted

## 2015-10-21 ENCOUNTER — Other Ambulatory Visit: Payer: Self-pay | Admitting: Oncology

## 2015-10-21 ENCOUNTER — Other Ambulatory Visit: Payer: Self-pay | Admitting: *Deleted

## 2015-10-21 DIAGNOSIS — R21 Rash and other nonspecific skin eruption: Secondary | ICD-10-CM

## 2015-10-21 DIAGNOSIS — C50411 Malignant neoplasm of upper-outer quadrant of right female breast: Secondary | ICD-10-CM

## 2015-10-21 DIAGNOSIS — L710 Perioral dermatitis: Secondary | ICD-10-CM

## 2015-11-13 ENCOUNTER — Other Ambulatory Visit: Payer: Self-pay

## 2015-11-13 DIAGNOSIS — C50411 Malignant neoplasm of upper-outer quadrant of right female breast: Secondary | ICD-10-CM

## 2015-11-14 ENCOUNTER — Other Ambulatory Visit: Payer: BLUE CROSS/BLUE SHIELD

## 2015-11-21 ENCOUNTER — Other Ambulatory Visit (HOSPITAL_BASED_OUTPATIENT_CLINIC_OR_DEPARTMENT_OTHER): Payer: BLUE CROSS/BLUE SHIELD

## 2015-11-21 DIAGNOSIS — C50411 Malignant neoplasm of upper-outer quadrant of right female breast: Secondary | ICD-10-CM | POA: Diagnosis not present

## 2015-11-21 LAB — COMPREHENSIVE METABOLIC PANEL
ALBUMIN: 4.2 g/dL (ref 3.5–5.0)
ALK PHOS: 65 U/L (ref 40–150)
ALT: 36 U/L (ref 0–55)
ANION GAP: 12 meq/L — AB (ref 3–11)
AST: 24 U/L (ref 5–34)
BILIRUBIN TOTAL: 0.29 mg/dL (ref 0.20–1.20)
BUN: 16 mg/dL (ref 7.0–26.0)
CALCIUM: 9.6 mg/dL (ref 8.4–10.4)
CHLORIDE: 107 meq/L (ref 98–109)
CO2: 20 mEq/L — ABNORMAL LOW (ref 22–29)
CREATININE: 0.9 mg/dL (ref 0.6–1.1)
EGFR: 68 mL/min/{1.73_m2} — ABNORMAL LOW (ref >90)
Glucose: 86 mg/dl (ref 70–140)
Potassium: 3.8 mEq/L (ref 3.5–5.1)
Sodium: 139 mEq/L (ref 136–145)
Total Protein: 8 g/dL (ref 6.4–8.3)

## 2015-11-21 LAB — CBC WITH DIFFERENTIAL/PLATELET
BASO%: 0.3 % (ref 0.0–2.0)
BASOS ABS: 0 10*3/uL (ref 0.0–0.1)
EOS ABS: 0 10*3/uL (ref 0.0–0.5)
EOS%: 0.9 % (ref 0.0–7.0)
HEMATOCRIT: 40.7 % (ref 34.8–46.6)
HEMOGLOBIN: 14.4 g/dL (ref 11.6–15.9)
LYMPH#: 1.6 10*3/uL (ref 0.9–3.3)
LYMPH%: 48.3 % (ref 14.0–49.7)
MCH: 35.6 pg — AB (ref 25.1–34.0)
MCHC: 35.4 g/dL (ref 31.5–36.0)
MCV: 100.5 fL (ref 79.5–101.0)
MONO#: 0.2 10*3/uL (ref 0.1–0.9)
MONO%: 7.3 % (ref 0.0–14.0)
NEUT#: 1.4 10*3/uL — ABNORMAL LOW (ref 1.5–6.5)
NEUT%: 43.2 % (ref 38.4–76.8)
Platelets: 243 10*3/uL (ref 145–400)
RBC: 4.05 10*6/uL (ref 3.70–5.45)
RDW: 14.4 % (ref 11.2–14.5)
WBC: 3.3 10*3/uL — ABNORMAL LOW (ref 3.9–10.3)

## 2015-12-03 ENCOUNTER — Other Ambulatory Visit (HOSPITAL_BASED_OUTPATIENT_CLINIC_OR_DEPARTMENT_OTHER): Payer: BLUE CROSS/BLUE SHIELD

## 2015-12-03 ENCOUNTER — Other Ambulatory Visit: Payer: Self-pay | Admitting: *Deleted

## 2015-12-03 DIAGNOSIS — C50411 Malignant neoplasm of upper-outer quadrant of right female breast: Secondary | ICD-10-CM

## 2015-12-03 DIAGNOSIS — Z170 Estrogen receptor positive status [ER+]: Principal | ICD-10-CM

## 2015-12-03 LAB — CBC WITH DIFFERENTIAL/PLATELET
BASO%: 1.1 % (ref 0.0–2.0)
Basophils Absolute: 0 10*3/uL (ref 0.0–0.1)
EOS%: 0.9 % (ref 0.0–7.0)
Eosinophils Absolute: 0 10*3/uL (ref 0.0–0.5)
HCT: 37.5 % (ref 34.8–46.6)
HGB: 12.9 g/dL (ref 11.6–15.9)
LYMPH#: 1.4 10*3/uL (ref 0.9–3.3)
LYMPH%: 42.1 % (ref 14.0–49.7)
MCH: 35.7 pg — ABNORMAL HIGH (ref 25.1–34.0)
MCHC: 34.3 g/dL (ref 31.5–36.0)
MCV: 104 fL — ABNORMAL HIGH (ref 79.5–101.0)
MONO#: 0.2 10*3/uL (ref 0.1–0.9)
MONO%: 6 % (ref 0.0–14.0)
NEUT#: 1.7 10*3/uL (ref 1.5–6.5)
NEUT%: 49.9 % (ref 38.4–76.8)
Platelets: 232 10*3/uL (ref 145–400)
RBC: 3.61 10*6/uL — AB (ref 3.70–5.45)
RDW: 15.7 % — ABNORMAL HIGH (ref 11.2–14.5)
WBC: 3.3 10*3/uL — ABNORMAL LOW (ref 3.9–10.3)

## 2015-12-03 MED ORDER — PALBOCICLIB 100 MG PO CAPS: 100.0000 mg | ORAL_CAPSULE | Freq: Every day | ORAL | 3 refills | Status: DC

## 2015-12-05 ENCOUNTER — Other Ambulatory Visit: Payer: Self-pay | Admitting: *Deleted

## 2015-12-12 ENCOUNTER — Ambulatory Visit (HOSPITAL_BASED_OUTPATIENT_CLINIC_OR_DEPARTMENT_OTHER): Payer: BLUE CROSS/BLUE SHIELD

## 2015-12-12 ENCOUNTER — Other Ambulatory Visit (HOSPITAL_BASED_OUTPATIENT_CLINIC_OR_DEPARTMENT_OTHER): Payer: BLUE CROSS/BLUE SHIELD

## 2015-12-12 VITALS — BP 150/68 | HR 73 | Temp 97.4°F | Resp 20

## 2015-12-12 DIAGNOSIS — C7951 Secondary malignant neoplasm of bone: Secondary | ICD-10-CM

## 2015-12-12 DIAGNOSIS — C50411 Malignant neoplasm of upper-outer quadrant of right female breast: Secondary | ICD-10-CM | POA: Diagnosis not present

## 2015-12-12 LAB — CBC WITH DIFFERENTIAL/PLATELET
BASO%: 0.3 % (ref 0.0–2.0)
BASOS ABS: 0 10*3/uL (ref 0.0–0.1)
EOS%: 0.5 % (ref 0.0–7.0)
Eosinophils Absolute: 0 10*3/uL (ref 0.0–0.5)
HCT: 39.9 % (ref 34.8–46.6)
HEMOGLOBIN: 13.6 g/dL (ref 11.6–15.9)
LYMPH%: 40.3 % (ref 14.0–49.7)
MCH: 35.4 pg — AB (ref 25.1–34.0)
MCHC: 34.2 g/dL (ref 31.5–36.0)
MCV: 103.5 fL — ABNORMAL HIGH (ref 79.5–101.0)
MONO#: 0.6 10*3/uL (ref 0.1–0.9)
MONO%: 13.3 % (ref 0.0–14.0)
NEUT#: 1.9 10*3/uL (ref 1.5–6.5)
NEUT%: 45.6 % (ref 38.4–76.8)
Platelets: 252 10*3/uL (ref 145–400)
RBC: 3.86 10*6/uL (ref 3.70–5.45)
RDW: 15.8 % — AB (ref 11.2–14.5)
WBC: 4.2 10*3/uL (ref 3.9–10.3)
lymph#: 1.7 10*3/uL (ref 0.9–3.3)

## 2015-12-12 LAB — COMPREHENSIVE METABOLIC PANEL
ALBUMIN: 3.9 g/dL (ref 3.5–5.0)
ALT: 41 U/L (ref 0–55)
AST: 23 U/L (ref 5–34)
Alkaline Phosphatase: 63 U/L (ref 40–150)
Anion Gap: 12 mEq/L — ABNORMAL HIGH (ref 3–11)
BUN: 12.6 mg/dL (ref 7.0–26.0)
CHLORIDE: 109 meq/L (ref 98–109)
CO2: 19 mEq/L — ABNORMAL LOW (ref 22–29)
Calcium: 10 mg/dL (ref 8.4–10.4)
Creatinine: 0.8 mg/dL (ref 0.6–1.1)
EGFR: 78 mL/min/{1.73_m2} — ABNORMAL LOW (ref >90)
GLUCOSE: 102 mg/dL (ref 70–140)
POTASSIUM: 4.2 meq/L (ref 3.5–5.1)
SODIUM: 140 meq/L (ref 136–145)
Total Bilirubin: 0.32 mg/dL (ref 0.20–1.20)
Total Protein: 7.2 g/dL (ref 6.4–8.3)

## 2015-12-12 MED ORDER — DENOSUMAB 120 MG/1.7ML ~~LOC~~ SOLN
120.0000 mg | Freq: Once | SUBCUTANEOUS | Status: AC
  Administered 2015-12-12: 120 mg via SUBCUTANEOUS
  Filled 2015-12-12: qty 1.7

## 2015-12-12 NOTE — Patient Instructions (Signed)
Denosumab injection What is this medicine? DENOSUMAB (den oh sue mab) slows bone breakdown. Prolia is used to treat osteoporosis in women after menopause and in men. Xgeva is used to prevent bone fractures and other bone problems caused by cancer bone metastases. Xgeva is also used to treat giant cell tumor of the bone. This medicine may be used for other purposes; ask your health care provider or pharmacist if you have questions. COMMON BRAND NAME(S): Prolia, XGEVA What should I tell my health care provider before I take this medicine? They need to know if you have any of these conditions: -dental disease -eczema -infection or history of infections -kidney disease or on dialysis -low blood calcium or vitamin D -malabsorption syndrome -scheduled to have surgery or tooth extraction -taking medicine that contains denosumab -thyroid or parathyroid disease -an unusual reaction to denosumab, other medicines, foods, dyes, or preservatives -pregnant or trying to get pregnant -breast-feeding How should I use this medicine? This medicine is for injection under the skin. It is given by a health care professional in a hospital or clinic setting. If you are getting Prolia, a special MedGuide will be given to you by the pharmacist with each prescription and refill. Be sure to read this information carefully each time. For Prolia, talk to your pediatrician regarding the use of this medicine in children. Special care may be needed. For Xgeva, talk to your pediatrician regarding the use of this medicine in children. While this drug may be prescribed for children as young as 13 years for selected conditions, precautions do apply. Overdosage: If you think you've taken too much of this medicine contact a poison control center or emergency room at once. Overdosage: If you think you have taken too much of this medicine contact a poison control center or emergency room at once. NOTE: This medicine is only for  you. Do not share this medicine with others. What if I miss a dose? It is important not to miss your dose. Call your doctor or health care professional if you are unable to keep an appointment. What may interact with this medicine? Do not take this medicine with any of the following medications: -other medicines containing denosumab This medicine may also interact with the following medications: -medicines that suppress the immune system -medicines that treat cancer -steroid medicines like prednisone or cortisone This list may not describe all possible interactions. Give your health care provider a list of all the medicines, herbs, non-prescription drugs, or dietary supplements you use. Also tell them if you smoke, drink alcohol, or use illegal drugs. Some items may interact with your medicine. What should I watch for while using this medicine? Visit your doctor or health care professional for regular checks on your progress. Your doctor or health care professional may order blood tests and other tests to see how you are doing. Call your doctor or health care professional if you get a cold or other infection while receiving this medicine. Do not treat yourself. This medicine may decrease your body's ability to fight infection. You should make sure you get enough calcium and vitamin D while you are taking this medicine, unless your doctor tells you not to. Discuss the foods you eat and the vitamins you take with your health care professional. See your dentist regularly. Brush and floss your teeth as directed. Before you have any dental work done, tell your dentist you are receiving this medicine. Do not become pregnant while taking this medicine or for 5 months after stopping   it. Women should inform their doctor if they wish to become pregnant or think they might be pregnant. There is a potential for serious side effects to an unborn child. Talk to your health care professional or pharmacist for more  information. What side effects may I notice from receiving this medicine? Side effects that you should report to your doctor or health care professional as soon as possible: -allergic reactions like skin rash, itching or hives, swelling of the face, lips, or tongue -breathing problems -chest pain -fast, irregular heartbeat -feeling faint or lightheaded, falls -fever, chills, or any other sign of infection -muscle spasms, tightening, or twitches -numbness or tingling -skin blisters or bumps, or is dry, peels, or red -slow healing or unexplained pain in the mouth or jaw -unusual bleeding or bruising Side effects that usually do not require medical attention (Report these to your doctor or health care professional if they continue or are bothersome.): -muscle pain -stomach upset, gas This list may not describe all possible side effects. Call your doctor for medical advice about side effects. You may report side effects to FDA at 1-800-FDA-1088. Where should I keep my medicine? This medicine is only given in a clinic, doctor's office, or other health care setting and will not be stored at home. NOTE: This sheet is a summary. It may not cover all possible information. If you have questions about this medicine, talk to your doctor, pharmacist, or health care provider.  2015, Elsevier/Gold Standard. (2011-07-13 12:37:47) Goserelin injection What is this medicine? GOSERELIN (GOE se rel in) is similar to a hormone found in the body. It lowers the amount of sex hormones that the body makes. Men will have lower testosterone levels and women will have lower estrogen levels while taking this medicine. In men, this medicine is used to treat prostate cancer; the injection is either given once per month or once every 12 weeks. A once per month injection (only) is used to treat women with endometriosis, dysfunctional uterine bleeding, or advanced breast cancer. This medicine may be used for other purposes;  ask your health care provider or pharmacist if you have questions. COMMON BRAND NAME(S): Zoladex What should I tell my health care provider before I take this medicine? They need to know if you have any of these conditions (some only apply to women): -diabetes -heart disease or previous heart attack -high blood pressure -high cholesterol -kidney disease -osteoporosis or low bone density -problems passing urine -spinal cord injury -stroke -tobacco smoker -an unusual or allergic reaction to goserelin, hormone therapy, other medicines, foods, dyes, or preservatives -pregnant or trying to get pregnant -breast-feeding How should I use this medicine? This medicine is for injection under the skin. It is given by a health care professional in a hospital or clinic setting. Men receive this injection once every 4 weeks or once every 12 weeks. Women will only receive the once every 4 weeks injection. Talk to your pediatrician regarding the use of this medicine in children. Special care may be needed. Overdosage: If you think you have taken too much of this medicine contact a poison control center or emergency room at once. NOTE: This medicine is only for you. Do not share this medicine with others. What if I miss a dose? It is important not to miss your dose. Call your doctor or health care professional if you are unable to keep an appointment. What may interact with this medicine? -female hormones like estrogen -herbal or dietary supplements like black cohosh, chasteberry,   or DHEA -female hormones like testosterone -prasterone This list may not describe all possible interactions. Give your health care provider a list of all the medicines, herbs, non-prescription drugs, or dietary supplements you use. Also tell them if you smoke, drink alcohol, or use illegal drugs. Some items may interact with your medicine. What should I watch for while using this medicine? Visit your doctor or health care  professional for regular checks on your progress. Your symptoms may appear to get worse during the first weeks of this therapy. Tell your doctor or healthcare professional if your symptoms do not start to get better or if they get worse after this time. Your bones may get weaker if you take this medicine for a long time. If you smoke or frequently drink alcohol you may increase your risk of bone loss. A family history of osteoporosis, chronic use of drugs for seizures (convulsions), or corticosteroids can also increase your risk of bone loss. Talk to your doctor about how to keep your bones strong. This medicine should stop regular monthly menstration in women. Tell your doctor if you continue to menstrate. Women should not become pregnant while taking this medicine or for 12 weeks after stopping this medicine. Women should inform their doctor if they wish to become pregnant or think they might be pregnant. There is a potential for serious side effects to an unborn child. Talk to your health care professional or pharmacist for more information. Do not breast-feed an infant while taking this medicine. Men should inform their doctors if they wish to father a child. This medicine may lower sperm counts. Talk to your health care professional or pharmacist for more information. What side effects may I notice from receiving this medicine? Side effects that you should report to your doctor or health care professional as soon as possible: -allergic reactions like skin rash, itching or hives, swelling of the face, lips, or tongue -bone pain -breathing problems -changes in vision -chest pain -feeling faint or lightheaded, falls -fever, chills -pain, swelling, warmth in the leg -pain, tingling, numbness in the hands or feet -signs and symptoms of low blood pressure like dizziness; feeling faint or lightheaded, falls; unusually weak or tired -stomach pain -swelling of the ankles, feet, hands -trouble passing  urine or change in the amount of urine -unusually high or low blood pressure -unusually weak or tired Side effects that usually do not require medical attention (report to your doctor or health care professional if they continue or are bothersome): -change in sex drive or performance -changes in breast size in both males and females -changes in emotions or moods -headache -hot flashes -irritation at site where injected -loss of appetite -skin problems like acne, dry skin -vaginal dryness This list may not describe all possible side effects. Call your doctor for medical advice about side effects. You may report side effects to FDA at 1-800-FDA-1088. Where should I keep my medicine? This drug is given in a hospital or clinic and will not be stored at home. NOTE: This sheet is a summary. It may not cover all possible information. If you have questions about this medicine, talk to your doctor, pharmacist, or health care provider.  2015, Elsevier/Gold Standard. (2013-03-21 11:10:35)  

## 2015-12-16 ENCOUNTER — Other Ambulatory Visit: Payer: Self-pay | Admitting: *Deleted

## 2015-12-16 MED ORDER — GABAPENTIN 300 MG PO CAPS: 300.0000 mg | ORAL_CAPSULE | Freq: Every day | ORAL | 3 refills | Status: DC

## 2015-12-18 ENCOUNTER — Other Ambulatory Visit: Payer: Self-pay | Admitting: *Deleted

## 2015-12-22 ENCOUNTER — Other Ambulatory Visit: Payer: Self-pay | Admitting: Oncology

## 2015-12-22 DIAGNOSIS — C50411 Malignant neoplasm of upper-outer quadrant of right female breast: Secondary | ICD-10-CM

## 2015-12-23 NOTE — Telephone Encounter (Signed)
Chart reviewed.

## 2015-12-27 ENCOUNTER — Ambulatory Visit (HOSPITAL_COMMUNITY): Payer: BLUE CROSS/BLUE SHIELD

## 2016-01-01 ENCOUNTER — Other Ambulatory Visit: Payer: Self-pay | Admitting: Oncology

## 2016-01-01 DIAGNOSIS — C50411 Malignant neoplasm of upper-outer quadrant of right female breast: Secondary | ICD-10-CM

## 2016-01-01 DIAGNOSIS — C50919 Malignant neoplasm of unspecified site of unspecified female breast: Secondary | ICD-10-CM | POA: Insufficient documentation

## 2016-01-03 ENCOUNTER — Ambulatory Visit (HOSPITAL_COMMUNITY): Payer: BLUE CROSS/BLUE SHIELD

## 2016-01-08 ENCOUNTER — Ambulatory Visit (HOSPITAL_COMMUNITY)
Admission: RE | Admit: 2016-01-08 | Discharge: 2016-01-08 | Disposition: A | Discharge status: Home / Self Care | Payer: BLUE CROSS/BLUE SHIELD | Source: Ambulatory Visit | Attending: Oncology | Admitting: Oncology

## 2016-01-08 ENCOUNTER — Encounter (HOSPITAL_COMMUNITY)
Admission: RE | Admit: 2016-01-08 | Discharge: 2016-01-08 | Disposition: A | Discharge status: Home / Self Care | Payer: BLUE CROSS/BLUE SHIELD | Source: Ambulatory Visit | Attending: Oncology | Admitting: Oncology

## 2016-01-08 ENCOUNTER — Other Ambulatory Visit: Payer: Self-pay

## 2016-01-08 DIAGNOSIS — C50411 Malignant neoplasm of upper-outer quadrant of right female breast: Secondary | ICD-10-CM

## 2016-01-08 DIAGNOSIS — C50919 Malignant neoplasm of unspecified site of unspecified female breast: Secondary | ICD-10-CM

## 2016-01-08 DIAGNOSIS — Z9013 Acquired absence of bilateral breasts and nipples: Secondary | ICD-10-CM | POA: Diagnosis not present

## 2016-01-08 DIAGNOSIS — C7951 Secondary malignant neoplasm of bone: Secondary | ICD-10-CM | POA: Insufficient documentation

## 2016-01-08 MED ORDER — IOPAMIDOL (ISOVUE-300) INJECTION 61%
INTRAVENOUS | Status: AC
  Administered 2016-01-08: 75 mL
  Filled 2016-01-08: qty 75

## 2016-01-08 MED ORDER — TECHNETIUM TC 99M MEDRONATE IV KIT
25.0000 | PACK | Freq: Once | INTRAVENOUS | Status: AC | PRN
  Administered 2016-01-08: 25 via INTRAVENOUS

## 2016-01-08 MED ORDER — TECHNETIUM TC 99M MEDRONATE IV KIT: 25.0000 | PACK | Freq: Once | INTRAVENOUS | Status: DC | PRN

## 2016-01-09 ENCOUNTER — Ambulatory Visit (HOSPITAL_BASED_OUTPATIENT_CLINIC_OR_DEPARTMENT_OTHER): Payer: BLUE CROSS/BLUE SHIELD | Admitting: Oncology

## 2016-01-09 ENCOUNTER — Other Ambulatory Visit (HOSPITAL_BASED_OUTPATIENT_CLINIC_OR_DEPARTMENT_OTHER): Payer: BLUE CROSS/BLUE SHIELD

## 2016-01-09 ENCOUNTER — Ambulatory Visit (HOSPITAL_BASED_OUTPATIENT_CLINIC_OR_DEPARTMENT_OTHER): Payer: BLUE CROSS/BLUE SHIELD

## 2016-01-09 VITALS — BP 127/64 | HR 74 | Temp 97.5°F | Resp 18 | Ht 63.0 in | Wt 227.1 lb

## 2016-01-09 VITALS — BP 136/58 | HR 70 | Temp 97.7°F | Resp 18

## 2016-01-09 DIAGNOSIS — C50411 Malignant neoplasm of upper-outer quadrant of right female breast: Secondary | ICD-10-CM

## 2016-01-09 DIAGNOSIS — C7801 Secondary malignant neoplasm of right lung: Secondary | ICD-10-CM

## 2016-01-09 DIAGNOSIS — C50919 Malignant neoplasm of unspecified site of unspecified female breast: Secondary | ICD-10-CM

## 2016-01-09 DIAGNOSIS — C7951 Secondary malignant neoplasm of bone: Secondary | ICD-10-CM

## 2016-01-09 DIAGNOSIS — Z170 Estrogen receptor positive status [ER+]: Secondary | ICD-10-CM

## 2016-01-09 DIAGNOSIS — C7800 Secondary malignant neoplasm of unspecified lung: Secondary | ICD-10-CM | POA: Insufficient documentation

## 2016-01-09 LAB — CBC WITH DIFFERENTIAL/PLATELET
BASO%: 0.6 % (ref 0.0–2.0)
Basophils Absolute: 0 10*3/uL (ref 0.0–0.1)
EOS%: 1.5 % (ref 0.0–7.0)
Eosinophils Absolute: 0.1 10*3/uL (ref 0.0–0.5)
HCT: 36.3 % (ref 34.8–46.6)
HEMOGLOBIN: 12.7 g/dL (ref 11.6–15.9)
LYMPH%: 47 % (ref 14.0–49.7)
MCH: 35.8 pg — AB (ref 25.1–34.0)
MCHC: 35 g/dL (ref 31.5–36.0)
MCV: 102.3 fL — ABNORMAL HIGH (ref 79.5–101.0)
MONO#: 0.3 10*3/uL (ref 0.1–0.9)
MONO%: 8.2 % (ref 0.0–14.0)
NEUT#: 1.4 10*3/uL — ABNORMAL LOW (ref 1.5–6.5)
NEUT%: 42.7 % (ref 38.4–76.8)
Platelets: 172 10*3/uL (ref 145–400)
RBC: 3.55 10*6/uL — AB (ref 3.70–5.45)
RDW: 14 % (ref 11.2–14.5)
WBC: 3.3 10*3/uL — AB (ref 3.9–10.3)
lymph#: 1.5 10*3/uL (ref 0.9–3.3)

## 2016-01-09 LAB — COMPREHENSIVE METABOLIC PANEL
ALBUMIN: 3.7 g/dL (ref 3.5–5.0)
ALK PHOS: 58 U/L (ref 40–150)
ALT: 28 U/L (ref 0–55)
AST: 19 U/L (ref 5–34)
Anion Gap: 12 mEq/L — ABNORMAL HIGH (ref 3–11)
BUN: 15.8 mg/dL (ref 7.0–26.0)
CHLORIDE: 107 meq/L (ref 98–109)
CO2: 20 meq/L — AB (ref 22–29)
Calcium: 9.6 mg/dL (ref 8.4–10.4)
Creatinine: 1 mg/dL (ref 0.6–1.1)
EGFR: 60 mL/min/{1.73_m2} — ABNORMAL LOW (ref >90)
GLUCOSE: 112 mg/dL (ref 70–140)
POTASSIUM: 4 meq/L (ref 3.5–5.1)
SODIUM: 139 meq/L (ref 136–145)
Total Bilirubin: 0.34 mg/dL (ref 0.20–1.20)
Total Protein: 7 g/dL (ref 6.4–8.3)

## 2016-01-09 MED ORDER — DENOSUMAB 120 MG/1.7ML ~~LOC~~ SOLN
120.0000 mg | Freq: Once | SUBCUTANEOUS | Status: AC
  Administered 2016-01-09: 120 mg via SUBCUTANEOUS
  Filled 2016-01-09: qty 1.7
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
## 2016-01-18 ENCOUNTER — Other Ambulatory Visit: Payer: Self-pay | Admitting: Oncology

## 2016-01-18 DIAGNOSIS — C50411 Malignant neoplasm of upper-outer quadrant of right female breast: Secondary | ICD-10-CM

## 2016-01-23 ENCOUNTER — Other Ambulatory Visit: Payer: Self-pay | Admitting: *Deleted

## 2016-02-05 ENCOUNTER — Other Ambulatory Visit: Payer: Self-pay | Admitting: *Deleted

## 2016-02-05 ENCOUNTER — Telehealth: Payer: Self-pay | Admitting: *Deleted

## 2016-02-05 DIAGNOSIS — C7801 Secondary malignant neoplasm of right lung: Secondary | ICD-10-CM

## 2016-02-05 DIAGNOSIS — C50919 Malignant neoplasm of unspecified site of unspecified female breast: Secondary | ICD-10-CM

## 2016-02-05 DIAGNOSIS — C50411 Malignant neoplasm of upper-outer quadrant of right female breast: Secondary | ICD-10-CM

## 2016-02-05 DIAGNOSIS — R404 Transient alteration of awareness: Secondary | ICD-10-CM

## 2016-02-05 DIAGNOSIS — G4489 Other headache syndrome: Secondary | ICD-10-CM

## 2016-02-05 DIAGNOSIS — Z170 Estrogen receptor positive status [ER+]: Principal | ICD-10-CM

## 2016-02-05 NOTE — Telephone Encounter (Signed)
Pt called to this RN per appointment scheduled tomorrow to see MD - states she was to have a PET scan which was denied by insurance in December.  Cheryl Moore states she also has been having a headache ongoing though not everyday since early December.  Per above and review - order for PET re- entered with appropriate diagnosis explanation for managed care as well as a CT of the head.  Pt will come in tomorrow for lab and injections - MD appointment will be rescheduled to later this month.  Scheduling request sent per above.

## 2016-02-06 ENCOUNTER — Ambulatory Visit: Payer: BLUE CROSS/BLUE SHIELD | Admitting: Oncology

## 2016-02-06 ENCOUNTER — Other Ambulatory Visit: Payer: Self-pay | Admitting: *Deleted

## 2016-02-06 ENCOUNTER — Ambulatory Visit (HOSPITAL_BASED_OUTPATIENT_CLINIC_OR_DEPARTMENT_OTHER): Payer: BLUE CROSS/BLUE SHIELD

## 2016-02-06 ENCOUNTER — Other Ambulatory Visit (HOSPITAL_BASED_OUTPATIENT_CLINIC_OR_DEPARTMENT_OTHER): Payer: BLUE CROSS/BLUE SHIELD

## 2016-02-06 VITALS — BP 115/64 | HR 70 | Temp 98.6°F | Resp 18

## 2016-02-06 DIAGNOSIS — C50919 Malignant neoplasm of unspecified site of unspecified female breast: Secondary | ICD-10-CM

## 2016-02-06 DIAGNOSIS — C7951 Secondary malignant neoplasm of bone: Secondary | ICD-10-CM

## 2016-02-06 DIAGNOSIS — C50411 Malignant neoplasm of upper-outer quadrant of right female breast: Secondary | ICD-10-CM | POA: Diagnosis not present

## 2016-02-06 LAB — CBC WITH DIFFERENTIAL/PLATELET
BASO%: 0.9 % (ref 0.0–2.0)
Basophils Absolute: 0.1 10*3/uL (ref 0.0–0.1)
EOS ABS: 0.1 10*3/uL (ref 0.0–0.5)
EOS%: 1.6 % (ref 0.0–7.0)
HCT: 38.8 % (ref 34.8–46.6)
HGB: 13.7 g/dL (ref 11.6–15.9)
LYMPH%: 29.8 % (ref 14.0–49.7)
MCH: 36.2 pg — AB (ref 25.1–34.0)
MCHC: 35.2 g/dL (ref 31.5–36.0)
MCV: 102.6 fL — AB (ref 79.5–101.0)
MONO#: 0.4 10*3/uL (ref 0.1–0.9)
MONO%: 7.9 % (ref 0.0–14.0)
NEUT#: 3.4 10*3/uL (ref 1.5–6.5)
NEUT%: 59.8 % (ref 38.4–76.8)
PLATELETS: 245 10*3/uL (ref 145–400)
RBC: 3.79 10*6/uL (ref 3.70–5.45)
RDW: 14.1 % (ref 11.2–14.5)
WBC: 5.6 10*3/uL (ref 3.9–10.3)
lymph#: 1.7 10*3/uL (ref 0.9–3.3)

## 2016-02-06 LAB — COMPREHENSIVE METABOLIC PANEL
ALT: 37 U/L (ref 0–55)
ANION GAP: 10 meq/L (ref 3–11)
AST: 20 U/L (ref 5–34)
Albumin: 3.8 g/dL (ref 3.5–5.0)
Alkaline Phosphatase: 59 U/L (ref 40–150)
BUN: 17.8 mg/dL (ref 7.0–26.0)
CHLORIDE: 109 meq/L (ref 98–109)
CO2: 20 meq/L — AB (ref 22–29)
Calcium: 9.6 mg/dL (ref 8.4–10.4)
Creatinine: 1 mg/dL (ref 0.6–1.1)
EGFR: 65 mL/min/{1.73_m2} — AB (ref >90)
GLUCOSE: 144 mg/dL — AB (ref 70–140)
POTASSIUM: 4.1 meq/L (ref 3.5–5.1)
SODIUM: 138 meq/L (ref 136–145)
Total Bilirubin: 0.39 mg/dL (ref 0.20–1.20)
Total Protein: 6.9 g/dL (ref 6.4–8.3)

## 2016-02-06 MED ORDER — DENOSUMAB 120 MG/1.7ML ~~LOC~~ SOLN
120.0000 mg | Freq: Once | SUBCUTANEOUS | Status: AC
  Administered 2016-02-06: 120 mg via SUBCUTANEOUS
  Filled 2016-02-06: qty 1.7

## 2016-02-06 NOTE — Patient Instructions (Signed)
Denosumab injection What is this medicine? DENOSUMAB (den oh sue mab) slows bone breakdown. Prolia is used to treat osteoporosis in women after menopause and in men. Xgeva is used to prevent bone fractures and other bone problems caused by cancer bone metastases. Xgeva is also used to treat giant cell tumor of the bone. This medicine may be used for other purposes; ask your health care provider or pharmacist if you have questions. COMMON BRAND NAME(S): Prolia, XGEVA What should I tell my health care provider before I take this medicine? They need to know if you have any of these conditions: -dental disease -eczema -infection or history of infections -kidney disease or on dialysis -low blood calcium or vitamin D -malabsorption syndrome -scheduled to have surgery or tooth extraction -taking medicine that contains denosumab -thyroid or parathyroid disease -an unusual reaction to denosumab, other medicines, foods, dyes, or preservatives -pregnant or trying to get pregnant -breast-feeding How should I use this medicine? This medicine is for injection under the skin. It is given by a health care professional in a hospital or clinic setting. If you are getting Prolia, a special MedGuide will be given to you by the pharmacist with each prescription and refill. Be sure to read this information carefully each time. For Prolia, talk to your pediatrician regarding the use of this medicine in children. Special care may be needed. For Xgeva, talk to your pediatrician regarding the use of this medicine in children. While this drug may be prescribed for children as young as 13 years for selected conditions, precautions do apply. Overdosage: If you think you've taken too much of this medicine contact a poison control center or emergency room at once. Overdosage: If you think you have taken too much of this medicine contact a poison control center or emergency room at once. NOTE: This medicine is only for  you. Do not share this medicine with others. What if I miss a dose? It is important not to miss your dose. Call your doctor or health care professional if you are unable to keep an appointment. What may interact with this medicine? Do not take this medicine with any of the following medications: -other medicines containing denosumab This medicine may also interact with the following medications: -medicines that suppress the immune system -medicines that treat cancer -steroid medicines like prednisone or cortisone This list may not describe all possible interactions. Give your health care provider a list of all the medicines, herbs, non-prescription drugs, or dietary supplements you use. Also tell them if you smoke, drink alcohol, or use illegal drugs. Some items may interact with your medicine. What should I watch for while using this medicine? Visit your doctor or health care professional for regular checks on your progress. Your doctor or health care professional may order blood tests and other tests to see how you are doing. Call your doctor or health care professional if you get a cold or other infection while receiving this medicine. Do not treat yourself. This medicine may decrease your body's ability to fight infection. You should make sure you get enough calcium and vitamin D while you are taking this medicine, unless your doctor tells you not to. Discuss the foods you eat and the vitamins you take with your health care professional. See your dentist regularly. Brush and floss your teeth as directed. Before you have any dental work done, tell your dentist you are receiving this medicine. Do not become pregnant while taking this medicine or for 5 months after stopping   it. Women should inform their doctor if they wish to become pregnant or think they might be pregnant. There is a potential for serious side effects to an unborn child. Talk to your health care professional or pharmacist for more  information. What side effects may I notice from receiving this medicine? Side effects that you should report to your doctor or health care professional as soon as possible: -allergic reactions like skin rash, itching or hives, swelling of the face, lips, or tongue -breathing problems -chest pain -fast, irregular heartbeat -feeling faint or lightheaded, falls -fever, chills, or any other sign of infection -muscle spasms, tightening, or twitches -numbness or tingling -skin blisters or bumps, or is dry, peels, or red -slow healing or unexplained pain in the mouth or jaw -unusual bleeding or bruising Side effects that usually do not require medical attention (Report these to your doctor or health care professional if they continue or are bothersome.): -muscle pain -stomach upset, gas This list may not describe all possible side effects. Call your doctor for medical advice about side effects. You may report side effects to FDA at 1-800-FDA-1088. Where should I keep my medicine? This medicine is only given in a clinic, doctor's office, or other health care setting and will not be stored at home. NOTE: This sheet is a summary. It may not cover all possible information. If you have questions about this medicine, talk to your doctor, pharmacist, or health care provider.  2015, Elsevier/Gold Standard. (2011-07-13 12:37:47) Goserelin injection What is this medicine? GOSERELIN (GOE se rel in) is similar to a hormone found in the body. It lowers the amount of sex hormones that the body makes. Men will have lower testosterone levels and women will have lower estrogen levels while taking this medicine. In men, this medicine is used to treat prostate cancer; the injection is either given once per month or once every 12 weeks. A once per month injection (only) is used to treat women with endometriosis, dysfunctional uterine bleeding, or advanced breast cancer. This medicine may be used for other purposes;  ask your health care provider or pharmacist if you have questions. COMMON BRAND NAME(S): Zoladex What should I tell my health care provider before I take this medicine? They need to know if you have any of these conditions (some only apply to women): -diabetes -heart disease or previous heart attack -high blood pressure -high cholesterol -kidney disease -osteoporosis or low bone density -problems passing urine -spinal cord injury -stroke -tobacco smoker -an unusual or allergic reaction to goserelin, hormone therapy, other medicines, foods, dyes, or preservatives -pregnant or trying to get pregnant -breast-feeding How should I use this medicine? This medicine is for injection under the skin. It is given by a health care professional in a hospital or clinic setting. Men receive this injection once every 4 weeks or once every 12 weeks. Women will only receive the once every 4 weeks injection. Talk to your pediatrician regarding the use of this medicine in children. Special care may be needed. Overdosage: If you think you have taken too much of this medicine contact a poison control center or emergency room at once. NOTE: This medicine is only for you. Do not share this medicine with others. What if I miss a dose? It is important not to miss your dose. Call your doctor or health care professional if you are unable to keep an appointment. What may interact with this medicine? -female hormones like estrogen -herbal or dietary supplements like black cohosh, chasteberry,   or DHEA -female hormones like testosterone -prasterone This list may not describe all possible interactions. Give your health care provider a list of all the medicines, herbs, non-prescription drugs, or dietary supplements you use. Also tell them if you smoke, drink alcohol, or use illegal drugs. Some items may interact with your medicine. What should I watch for while using this medicine? Visit your doctor or health care  professional for regular checks on your progress. Your symptoms may appear to get worse during the first weeks of this therapy. Tell your doctor or healthcare professional if your symptoms do not start to get better or if they get worse after this time. Your bones may get weaker if you take this medicine for a long time. If you smoke or frequently drink alcohol you may increase your risk of bone loss. A family history of osteoporosis, chronic use of drugs for seizures (convulsions), or corticosteroids can also increase your risk of bone loss. Talk to your doctor about how to keep your bones strong. This medicine should stop regular monthly menstration in women. Tell your doctor if you continue to menstrate. Women should not become pregnant while taking this medicine or for 12 weeks after stopping this medicine. Women should inform their doctor if they wish to become pregnant or think they might be pregnant. There is a potential for serious side effects to an unborn child. Talk to your health care professional or pharmacist for more information. Do not breast-feed an infant while taking this medicine. Men should inform their doctors if they wish to father a child. This medicine may lower sperm counts. Talk to your health care professional or pharmacist for more information. What side effects may I notice from receiving this medicine? Side effects that you should report to your doctor or health care professional as soon as possible: -allergic reactions like skin rash, itching or hives, swelling of the face, lips, or tongue -bone pain -breathing problems -changes in vision -chest pain -feeling faint or lightheaded, falls -fever, chills -pain, swelling, warmth in the leg -pain, tingling, numbness in the hands or feet -signs and symptoms of low blood pressure like dizziness; feeling faint or lightheaded, falls; unusually weak or tired -stomach pain -swelling of the ankles, feet, hands -trouble passing  urine or change in the amount of urine -unusually high or low blood pressure -unusually weak or tired Side effects that usually do not require medical attention (report to your doctor or health care professional if they continue or are bothersome): -change in sex drive or performance -changes in breast size in both males and females -changes in emotions or moods -headache -hot flashes -irritation at site where injected -loss of appetite -skin problems like acne, dry skin -vaginal dryness This list may not describe all possible side effects. Call your doctor for medical advice about side effects. You may report side effects to FDA at 1-800-FDA-1088. Where should I keep my medicine? This drug is given in a hospital or clinic and will not be stored at home. NOTE: This sheet is a summary. It may not cover all possible information. If you have questions about this medicine, talk to your doctor, pharmacist, or health care provider.  2015, Elsevier/Gold Standard. (2013-03-21 11:10:35)  

## 2016-02-11 ENCOUNTER — Telehealth: Payer: Self-pay | Admitting: Oncology

## 2016-02-11 ENCOUNTER — Other Ambulatory Visit: Payer: Self-pay | Admitting: Oncology

## 2016-02-11 ENCOUNTER — Telehealth: Payer: Self-pay | Admitting: *Deleted

## 2016-02-11 NOTE — Telephone Encounter (Signed)
Spoke with patient re 1/24 f/u
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
## 2016-02-14 ENCOUNTER — Ambulatory Visit (HOSPITAL_COMMUNITY)
Admission: RE | Admit: 2016-02-14 | Discharge: 2016-02-14 | Disposition: A | Discharge status: Home / Self Care | Payer: BLUE CROSS/BLUE SHIELD | Source: Ambulatory Visit | Attending: Oncology | Admitting: Oncology

## 2016-02-14 DIAGNOSIS — C7801 Secondary malignant neoplasm of right lung: Secondary | ICD-10-CM | POA: Diagnosis not present

## 2016-02-14 DIAGNOSIS — G4489 Other headache syndrome: Secondary | ICD-10-CM | POA: Diagnosis present

## 2016-02-14 DIAGNOSIS — Z170 Estrogen receptor positive status [ER+]: Secondary | ICD-10-CM | POA: Diagnosis not present

## 2016-02-14 DIAGNOSIS — C50411 Malignant neoplasm of upper-outer quadrant of right female breast: Secondary | ICD-10-CM | POA: Insufficient documentation

## 2016-02-14 DIAGNOSIS — R404 Transient alteration of awareness: Secondary | ICD-10-CM | POA: Diagnosis not present

## 2016-02-14 DIAGNOSIS — C50919 Malignant neoplasm of unspecified site of unspecified female breast: Secondary | ICD-10-CM

## 2016-02-14 MED ORDER — IOPAMIDOL (ISOVUE-300) INJECTION 61%
75.0000 mL | Freq: Once | INTRAVENOUS | Status: AC | PRN
  Administered 2016-02-14: 75 mL via INTRAVENOUS

## 2016-02-14 MED ORDER — IOPAMIDOL (ISOVUE-300) INJECTION 61%
INTRAVENOUS | Status: AC
  Filled 2016-02-14: qty 75

## 2016-02-18 ENCOUNTER — Ambulatory Visit
Admission: RE | Admit: 2016-02-18 | Discharge: 2016-02-18 | Disposition: A | Discharge status: Home / Self Care | Payer: BLUE CROSS/BLUE SHIELD | Source: Ambulatory Visit | Attending: Oncology | Admitting: Oncology

## 2016-02-18 DIAGNOSIS — K760 Fatty (change of) liver, not elsewhere classified: Secondary | ICD-10-CM | POA: Insufficient documentation

## 2016-02-18 DIAGNOSIS — G4489 Other headache syndrome: Secondary | ICD-10-CM | POA: Diagnosis not present

## 2016-02-18 DIAGNOSIS — Z170 Estrogen receptor positive status [ER+]: Secondary | ICD-10-CM | POA: Diagnosis not present

## 2016-02-18 DIAGNOSIS — C7801 Secondary malignant neoplasm of right lung: Secondary | ICD-10-CM

## 2016-02-18 DIAGNOSIS — C50919 Malignant neoplasm of unspecified site of unspecified female breast: Secondary | ICD-10-CM

## 2016-02-18 DIAGNOSIS — I517 Cardiomegaly: Secondary | ICD-10-CM | POA: Insufficient documentation

## 2016-02-18 DIAGNOSIS — Z79899 Other long term (current) drug therapy: Secondary | ICD-10-CM | POA: Insufficient documentation

## 2016-02-18 DIAGNOSIS — C50411 Malignant neoplasm of upper-outer quadrant of right female breast: Secondary | ICD-10-CM | POA: Diagnosis not present

## 2016-02-18 DIAGNOSIS — R404 Transient alteration of awareness: Secondary | ICD-10-CM | POA: Insufficient documentation

## 2016-02-18 LAB — GLUCOSE, CAPILLARY: Glucose-Capillary: 111 mg/dL — ABNORMAL HIGH (ref 65–99)

## 2016-02-18 MED ORDER — FLUDEOXYGLUCOSE F - 18 (FDG) INJECTION
12.0000 | Freq: Once | INTRAVENOUS | Status: AC | PRN
  Administered 2016-02-18: 11.99 via INTRAVENOUS

## 2016-02-19 ENCOUNTER — Ambulatory Visit (HOSPITAL_BASED_OUTPATIENT_CLINIC_OR_DEPARTMENT_OTHER): Payer: BLUE CROSS/BLUE SHIELD | Admitting: Oncology

## 2016-02-19 VITALS — BP 100/56 | HR 85 | Temp 97.4°F | Resp 17 | Ht 63.0 in | Wt 227.4 lb

## 2016-02-19 DIAGNOSIS — Z7189 Other specified counseling: Secondary | ICD-10-CM | POA: Insufficient documentation

## 2016-02-19 DIAGNOSIS — Z170 Estrogen receptor positive status [ER+]: Secondary | ICD-10-CM | POA: Diagnosis not present

## 2016-02-19 DIAGNOSIS — C50411 Malignant neoplasm of upper-outer quadrant of right female breast: Secondary | ICD-10-CM | POA: Diagnosis not present

## 2016-02-19 DIAGNOSIS — C7951 Secondary malignant neoplasm of bone: Secondary | ICD-10-CM | POA: Diagnosis not present

## 2016-02-19 DIAGNOSIS — C50919 Malignant neoplasm of unspecified site of unspecified female breast: Secondary | ICD-10-CM

## 2016-02-19 DIAGNOSIS — C7801 Secondary malignant neoplasm of right lung: Secondary | ICD-10-CM

## 2016-02-19 MED ORDER — PALBOCICLIB 100 MG PO CAPS: 100.0000 mg | ORAL_CAPSULE | Freq: Every day | ORAL | 3 refills | Status: DC

## 2016-02-19 MED ORDER — ONDANSETRON HCL 4 MG PO TABS: 4.0000 mg | ORAL_TABLET | Freq: Every day | ORAL | 0 refills | Status: DC | PRN

## 2016-02-19 MED ORDER — LETROZOLE 2.5 MG PO TABS: 2.5000 mg | ORAL_TABLET | Freq: Every day | ORAL | 1 refills | Status: DC
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
## 2016-02-20 ENCOUNTER — Telehealth: Payer: Self-pay | Admitting: *Deleted

## 2016-02-20 MED ORDER — PALBOCICLIB 100 MG PO CAPS: 100.0000 mg | ORAL_CAPSULE | Freq: Every day | ORAL | 3 refills | Status: DC

## 2016-02-20 NOTE — Telephone Encounter (Signed)
Call from [place] requesting refill for Ibrance.  Spoke with Pharmacist Ophelia Charter provided order as entered 02-19-2016 by Dr. Jana Hakim.  Answered Pharmacist questions of patient allergies and she is receiving Xgeva and letrozole.  No further questions.

## 2016-02-21 ENCOUNTER — Other Ambulatory Visit: Payer: Self-pay | Admitting: *Deleted

## 2016-02-21 MED ORDER — PALBOCICLIB 100 MG PO CAPS: 100.0000 mg | ORAL_CAPSULE | Freq: Every day | ORAL | 3 refills | Status: DC

## 2016-02-27 ENCOUNTER — Telehealth: Payer: Self-pay | Admitting: *Deleted

## 2016-02-27 NOTE — Telephone Encounter (Signed)
"  This is Cheryl Moore with [facility].  We try to call this patient in reference to her Ibrance.  We need a better number to reach her."  Confirmed [facility] is calling [phone].  Provided spouse Cheryl Moore number [phone].  No further questions.

## 2016-03-04 ENCOUNTER — Other Ambulatory Visit: Payer: Self-pay | Admitting: Adult Health

## 2016-03-04 DIAGNOSIS — C50411 Malignant neoplasm of upper-outer quadrant of right female breast: Secondary | ICD-10-CM

## 2016-03-04 DIAGNOSIS — Z170 Estrogen receptor positive status [ER+]: Principal | ICD-10-CM

## 2016-03-05 ENCOUNTER — Ambulatory Visit (HOSPITAL_BASED_OUTPATIENT_CLINIC_OR_DEPARTMENT_OTHER): Payer: BLUE CROSS/BLUE SHIELD

## 2016-03-05 ENCOUNTER — Ambulatory Visit: Payer: BLUE CROSS/BLUE SHIELD | Admitting: Nutrition

## 2016-03-05 ENCOUNTER — Other Ambulatory Visit (HOSPITAL_BASED_OUTPATIENT_CLINIC_OR_DEPARTMENT_OTHER): Payer: BLUE CROSS/BLUE SHIELD

## 2016-03-05 VITALS — BP 129/67 | HR 77 | Temp 98.3°F | Resp 20

## 2016-03-05 DIAGNOSIS — C7951 Secondary malignant neoplasm of bone: Secondary | ICD-10-CM | POA: Diagnosis not present

## 2016-03-05 DIAGNOSIS — C50411 Malignant neoplasm of upper-outer quadrant of right female breast: Secondary | ICD-10-CM

## 2016-03-05 DIAGNOSIS — Z170 Estrogen receptor positive status [ER+]: Principal | ICD-10-CM

## 2016-03-05 LAB — COMPREHENSIVE METABOLIC PANEL
ALT: 39 U/L (ref 0–55)
ANION GAP: 9 meq/L (ref 3–11)
AST: 20 U/L (ref 5–34)
Albumin: 3.6 g/dL (ref 3.5–5.0)
Alkaline Phosphatase: 63 U/L (ref 40–150)
BILIRUBIN TOTAL: 0.26 mg/dL (ref 0.20–1.20)
BUN: 10.4 mg/dL (ref 7.0–26.0)
CO2: 25 meq/L (ref 22–29)
CREATININE: 1 mg/dL (ref 0.6–1.1)
Calcium: 9.5 mg/dL (ref 8.4–10.4)
Chloride: 108 mEq/L (ref 98–109)
EGFR: 61 mL/min/{1.73_m2} — ABNORMAL LOW (ref >90)
GLUCOSE: 117 mg/dL (ref 70–140)
Potassium: 3.8 mEq/L (ref 3.5–5.1)
SODIUM: 143 meq/L (ref 136–145)
TOTAL PROTEIN: 6.7 g/dL (ref 6.4–8.3)

## 2016-03-05 LAB — CBC WITH DIFFERENTIAL/PLATELET
BASO%: 0.4 % (ref 0.0–2.0)
Basophils Absolute: 0 10*3/uL (ref 0.0–0.1)
EOS%: 1.5 % (ref 0.0–7.0)
Eosinophils Absolute: 0.1 10*3/uL (ref 0.0–0.5)
HCT: 38.4 % (ref 34.8–46.6)
HGB: 13.2 g/dL (ref 11.6–15.9)
LYMPH%: 25.9 % (ref 14.0–49.7)
MCH: 34.9 pg — ABNORMAL HIGH (ref 25.1–34.0)
MCHC: 34.4 g/dL (ref 31.5–36.0)
MCV: 101.6 fL — ABNORMAL HIGH (ref 79.5–101.0)
MONO#: 0.5 10*3/uL (ref 0.1–0.9)
MONO%: 7.6 % (ref 0.0–14.0)
NEUT%: 64.6 % (ref 38.4–76.8)
NEUTROS ABS: 3.9 10*3/uL (ref 1.5–6.5)
PLATELETS: 237 10*3/uL (ref 145–400)
RBC: 3.78 10*6/uL (ref 3.70–5.45)
RDW: 13.1 % (ref 11.2–14.5)
WBC: 6.1 10*3/uL (ref 3.9–10.3)
lymph#: 1.6 10*3/uL (ref 0.9–3.3)

## 2016-03-05 MED ORDER — DENOSUMAB 120 MG/1.7ML ~~LOC~~ SOLN
120.0000 mg | Freq: Once | SUBCUTANEOUS | Status: AC
  Administered 2016-03-05: 120 mg via SUBCUTANEOUS
  Filled 2016-03-05: qty 1.7

## 2016-03-05 NOTE — Progress Notes (Signed)
56 year old female diagnosed with stage IV ER positive breast cancer in April 2014.  She is a patient of Dr. Jana Hakim.  Past medical history includes anxiety, increased HG A1c, obesity.  Medications include Xanax, vitamin C Femara, multivitamin, Zofran, Ibrance, Compazine, and Effexor.  Labs were reviewed.  Height: 63 inches. Weight: 227.4 pounds. Usual body weight: Approximately 225 pounds. BMI: 40.28.  Patient reports she would like to choose healthier foods in an attempt to lose some weight. She reports she has previously eaten food she enjoys and has not worried about weight gain. She reports she exercises quite often swimming 3 days a week and riding her bike 3 days a week  Nutrition diagnosis:  Food and nutrition related knowledge deficit related to breast cancer and associated treatments as evidenced by no prior need for nutrition related information.  Intervention: Patient educated on healthy plant-based diet with adequate protein to promote slow safe weight loss. Encouraged patient to continue present activities and consider adding strength training as allowed by M.D. Recommended patient follow plant-based diet and consider documenting food intake using web based apps such as my fitness pal or lose it. Encouraged patient to increase fruits and vegetables to approximately 5-7 servings daily. Encouraged portion control. Questions were answered.  Teach back method used.  Fact sheets were provided and contact information was given.  Monitoring, evaluation, goals:  Patient will tolerate healthy plant-based diet for slow safe weight loss.   No follow-up required.  Nutrition diagnosis resolved.  **Disclaimer: This note was dictated with voice recognition software. Similar sounding words can inadvertently be transcribed and this note may contain transcription errors which may not have been corrected upon publication of note.**

## 2016-03-17 ENCOUNTER — Other Ambulatory Visit: Payer: Self-pay | Admitting: *Deleted

## 2016-03-17 MED ORDER — GABAPENTIN 100 MG PO CAPS: 100.0000 mg | ORAL_CAPSULE | Freq: Every day | ORAL | 3 refills | Status: DC

## 2016-04-02 ENCOUNTER — Ambulatory Visit (HOSPITAL_BASED_OUTPATIENT_CLINIC_OR_DEPARTMENT_OTHER): Payer: BLUE CROSS/BLUE SHIELD

## 2016-04-02 ENCOUNTER — Other Ambulatory Visit (HOSPITAL_BASED_OUTPATIENT_CLINIC_OR_DEPARTMENT_OTHER): Payer: BLUE CROSS/BLUE SHIELD

## 2016-04-02 VITALS — BP 130/57 | HR 78 | Temp 98.0°F | Resp 18

## 2016-04-02 DIAGNOSIS — C50411 Malignant neoplasm of upper-outer quadrant of right female breast: Secondary | ICD-10-CM

## 2016-04-02 DIAGNOSIS — C7951 Secondary malignant neoplasm of bone: Secondary | ICD-10-CM | POA: Diagnosis not present

## 2016-04-02 DIAGNOSIS — Z170 Estrogen receptor positive status [ER+]: Principal | ICD-10-CM

## 2016-04-02 LAB — CBC WITH DIFFERENTIAL/PLATELET
BASO%: 0.7 % (ref 0.0–2.0)
BASOS ABS: 0 10*3/uL (ref 0.0–0.1)
EOS ABS: 0 10*3/uL (ref 0.0–0.5)
EOS%: 0.7 % (ref 0.0–7.0)
HEMATOCRIT: 37.4 % (ref 34.8–46.6)
HGB: 13.2 g/dL (ref 11.6–15.9)
LYMPH#: 1.6 10*3/uL (ref 0.9–3.3)
LYMPH%: 53.9 % — ABNORMAL HIGH (ref 14.0–49.7)
MCH: 34.8 pg — AB (ref 25.1–34.0)
MCHC: 35.3 g/dL (ref 31.5–36.0)
MCV: 98.7 fL (ref 79.5–101.0)
MONO#: 0.2 10*3/uL (ref 0.1–0.9)
MONO%: 6.8 % (ref 0.0–14.0)
NEUT#: 1.1 10*3/uL — ABNORMAL LOW (ref 1.5–6.5)
NEUT%: 37.9 % — AB (ref 38.4–76.8)
PLATELETS: 206 10*3/uL (ref 145–400)
RBC: 3.79 10*6/uL (ref 3.70–5.45)
RDW: 13.7 % (ref 11.2–14.5)
WBC: 3 10*3/uL — ABNORMAL LOW (ref 3.9–10.3)

## 2016-04-02 LAB — COMPREHENSIVE METABOLIC PANEL
ALT: 26 U/L (ref 0–55)
ANION GAP: 9 meq/L (ref 3–11)
AST: 17 U/L (ref 5–34)
Albumin: 3.9 g/dL (ref 3.5–5.0)
Alkaline Phosphatase: 58 U/L (ref 40–150)
BUN: 15.9 mg/dL (ref 7.0–26.0)
CALCIUM: 9.7 mg/dL (ref 8.4–10.4)
CHLORIDE: 106 meq/L (ref 98–109)
CO2: 25 mEq/L (ref 22–29)
CREATININE: 1.1 mg/dL (ref 0.6–1.1)
EGFR: 57 mL/min/{1.73_m2} — ABNORMAL LOW (ref >90)
Glucose: 154 mg/dl — ABNORMAL HIGH (ref 70–140)
Potassium: 4.3 mEq/L (ref 3.5–5.1)
Sodium: 139 mEq/L (ref 136–145)
Total Bilirubin: 0.28 mg/dL (ref 0.20–1.20)
Total Protein: 7.1 g/dL (ref 6.4–8.3)

## 2016-04-02 MED ORDER — DENOSUMAB 120 MG/1.7ML ~~LOC~~ SOLN
120.0000 mg | Freq: Once | SUBCUTANEOUS | Status: AC
  Administered 2016-04-02: 120 mg via SUBCUTANEOUS
  Filled 2016-04-02: qty 1.7

## 2016-04-08 ENCOUNTER — Other Ambulatory Visit (HOSPITAL_BASED_OUTPATIENT_CLINIC_OR_DEPARTMENT_OTHER): Payer: BLUE CROSS/BLUE SHIELD

## 2016-04-08 DIAGNOSIS — Z170 Estrogen receptor positive status [ER+]: Principal | ICD-10-CM

## 2016-04-08 DIAGNOSIS — C50411 Malignant neoplasm of upper-outer quadrant of right female breast: Secondary | ICD-10-CM

## 2016-04-08 LAB — COMPREHENSIVE METABOLIC PANEL
ALBUMIN: 3.9 g/dL (ref 3.5–5.0)
ALK PHOS: 53 U/L (ref 40–150)
ALT: 27 U/L (ref 0–55)
AST: 16 U/L (ref 5–34)
Anion Gap: 10 mEq/L (ref 3–11)
BILIRUBIN TOTAL: 0.26 mg/dL (ref 0.20–1.20)
BUN: 16.8 mg/dL (ref 7.0–26.0)
CALCIUM: 9.7 mg/dL (ref 8.4–10.4)
CO2: 22 mEq/L (ref 22–29)
CREATININE: 1 mg/dL (ref 0.6–1.1)
Chloride: 108 mEq/L (ref 98–109)
EGFR: 64 mL/min/{1.73_m2} — ABNORMAL LOW (ref >90)
GLUCOSE: 177 mg/dL — AB (ref 70–140)
Potassium: 4.3 mEq/L (ref 3.5–5.1)
SODIUM: 140 meq/L (ref 136–145)
TOTAL PROTEIN: 6.9 g/dL (ref 6.4–8.3)

## 2016-04-08 LAB — CBC WITH DIFFERENTIAL/PLATELET
BASO%: 1.2 % (ref 0.0–2.0)
BASOS ABS: 0 10*3/uL (ref 0.0–0.1)
EOS%: 0.3 % (ref 0.0–7.0)
Eosinophils Absolute: 0 10*3/uL (ref 0.0–0.5)
HEMATOCRIT: 37.9 % (ref 34.8–46.6)
HEMOGLOBIN: 13.2 g/dL (ref 11.6–15.9)
LYMPH#: 1.6 10*3/uL (ref 0.9–3.3)
LYMPH%: 43.7 % (ref 14.0–49.7)
MCH: 34.8 pg — AB (ref 25.1–34.0)
MCHC: 34.8 g/dL (ref 31.5–36.0)
MCV: 100 fL (ref 79.5–101.0)
MONO#: 0.5 10*3/uL (ref 0.1–0.9)
MONO%: 12.7 % (ref 0.0–14.0)
NEUT%: 42.1 % (ref 38.4–76.8)
NEUTROS ABS: 1.5 10*3/uL (ref 1.5–6.5)
Platelets: 263 10*3/uL (ref 145–400)
RBC: 3.79 10*6/uL (ref 3.70–5.45)
RDW: 14.8 % — AB (ref 11.2–14.5)
WBC: 3.6 10*3/uL — AB (ref 3.9–10.3)

## 2016-04-10 ENCOUNTER — Telehealth: Payer: Self-pay | Admitting: *Deleted

## 2016-04-10 NOTE — Telephone Encounter (Signed)
"  I need to know what my lab results are.  The Cheryl Moore was stopped due to neutrophils being too low."  Advised labs have improved.  Neutrophils = 1.5.  Message left for patient to receive return call with next steps.Marland Kitchen

## 2016-04-29 ENCOUNTER — Other Ambulatory Visit: Payer: Self-pay | Admitting: Emergency Medicine

## 2016-04-29 ENCOUNTER — Encounter: Payer: Self-pay | Admitting: Emergency Medicine

## 2016-04-29 NOTE — Progress Notes (Signed)
Per Dr Jana Hakim; patient is to continue Xgeva monthly as scheduled at this time. He will reevaluate during his next office visit with her in July 2018.

## 2016-04-30 ENCOUNTER — Other Ambulatory Visit (HOSPITAL_BASED_OUTPATIENT_CLINIC_OR_DEPARTMENT_OTHER): Payer: BLUE CROSS/BLUE SHIELD

## 2016-04-30 ENCOUNTER — Ambulatory Visit (HOSPITAL_BASED_OUTPATIENT_CLINIC_OR_DEPARTMENT_OTHER): Payer: BLUE CROSS/BLUE SHIELD

## 2016-04-30 VITALS — BP 141/64 | HR 68 | Temp 98.2°F | Resp 18

## 2016-04-30 DIAGNOSIS — C50411 Malignant neoplasm of upper-outer quadrant of right female breast: Secondary | ICD-10-CM | POA: Diagnosis not present

## 2016-04-30 DIAGNOSIS — C7951 Secondary malignant neoplasm of bone: Secondary | ICD-10-CM

## 2016-04-30 DIAGNOSIS — Z170 Estrogen receptor positive status [ER+]: Principal | ICD-10-CM

## 2016-04-30 LAB — CBC WITH DIFFERENTIAL/PLATELET
BASO%: 0.3 % (ref 0.0–2.0)
Basophils Absolute: 0 10*3/uL (ref 0.0–0.1)
EOS%: 0.3 % (ref 0.0–7.0)
Eosinophils Absolute: 0 10*3/uL (ref 0.0–0.5)
HEMATOCRIT: 36.5 % (ref 34.8–46.6)
HEMOGLOBIN: 12.6 g/dL (ref 11.6–15.9)
LYMPH#: 1.4 10*3/uL (ref 0.9–3.3)
LYMPH%: 39.7 % (ref 14.0–49.7)
MCH: 34.1 pg — ABNORMAL HIGH (ref 25.1–34.0)
MCHC: 34.5 g/dL (ref 31.5–36.0)
MCV: 98.6 fL (ref 79.5–101.0)
MONO#: 0.2 10*3/uL (ref 0.1–0.9)
MONO%: 6.2 % (ref 0.0–14.0)
NEUT%: 53.5 % (ref 38.4–76.8)
NEUTROS ABS: 1.9 10*3/uL (ref 1.5–6.5)
Platelets: 197 10*3/uL (ref 145–400)
RBC: 3.7 10*6/uL (ref 3.70–5.45)
RDW: 14.3 % (ref 11.2–14.5)
WBC: 3.5 10*3/uL — ABNORMAL LOW (ref 3.9–10.3)

## 2016-04-30 LAB — COMPREHENSIVE METABOLIC PANEL
ALBUMIN: 3.8 g/dL (ref 3.5–5.0)
ALK PHOS: 52 U/L (ref 40–150)
ALT: 20 U/L (ref 0–55)
AST: 16 U/L (ref 5–34)
Anion Gap: 10 mEq/L (ref 3–11)
BUN: 15.7 mg/dL (ref 7.0–26.0)
CALCIUM: 9.4 mg/dL (ref 8.4–10.4)
CHLORIDE: 110 meq/L — AB (ref 98–109)
CO2: 20 mEq/L — ABNORMAL LOW (ref 22–29)
CREATININE: 1 mg/dL (ref 0.6–1.1)
EGFR: 67 mL/min/{1.73_m2} — ABNORMAL LOW (ref >90)
GLUCOSE: 110 mg/dL (ref 70–140)
Potassium: 4 mEq/L (ref 3.5–5.1)
SODIUM: 140 meq/L (ref 136–145)
Total Bilirubin: 0.38 mg/dL (ref 0.20–1.20)
Total Protein: 6.8 g/dL (ref 6.4–8.3)

## 2016-04-30 MED ORDER — DENOSUMAB 120 MG/1.7ML ~~LOC~~ SOLN
120.0000 mg | Freq: Once | SUBCUTANEOUS | Status: AC
Start: 2016-04-30 — End: 2016-04-30
  Administered 2016-04-30: 120 mg via SUBCUTANEOUS
  Filled 2016-04-30: qty 1.7

## 2016-04-30 NOTE — Progress Notes (Signed)
Xgeva injection was given to pt using 04/08/16 CMET due to CMET machine being down in the laboratory. Per pharmacy Arbie Cookey. Porsche Cates LPN

## 2016-05-19 NOTE — Progress Notes (Signed)
56 y.o. G18P1001 Married Caucasian female here for annual exam.    Had a PET and brain scan which were all negative.  On Ibrance and having diarrhea. Hydrating well. Taking vitamins with potassium and eats bananas.  Off Effexor since February.  Hot flashes are almost gone.   Coconut oil helping vaginal dryness.  Son is doing home schooling and doing well.  He has a girlfriend.   Doing a lot of bicycling.   PCP: None   No LMP recorded. Patient is not currently having periods (Reason: Chemotherapy).           Sexually active: Yes.    The current method of family planning is post menopausal status.    Exercising: Yes.    Swimming, gardening and biking Smoker:  no  Health Maintenance: Pap: 05-17-15 Neg:Neg HR HPV;05-03-13 Neg:Neg HR HPV History of abnormal Pap:  no MMG:  05-03-12 Dx'd with Rt.breast cancer--Bil.mastectomy Colonoscopy:  09-12-13 polyps with Dr. Verdell Face due 08/2016  BMD: n/a  Result n/a TDaP: 05-02-12 Gardasil:   no HIV:Yes--Neg Hep C: Yes--Neg Screening Labs:  Hb today: with Oncology, Urine today: not done   reports that she quit smoking about 14 years ago. Her smoking use included Cigarettes. She has a 1.25 pack-year smoking history. She has never used smokeless tobacco. She reports that she drinks about 0.6 oz of alcohol per week . She reports that she does not use drugs.  Past Medical History:  Diagnosis Date  . Anxiety   . Breast cancer ([place])    right  . Elevated hemoglobin A1c 11/05/14   level 6.2  . History of radiation therapy 11/28/12-03/06/13   right breast/64.4Gy/ right hip=37.5Gy   . Hot flashes   . 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 Obesity   . Personal history of colonic polyps - ssp and adenomas 09/19/2013    Past Surgical History:  Procedure Laterality Date  . BREAST BIOPSY Right 05/23/2012   Procedure: SKIN PUNCH BIOPSY RIGHT BREAST;  Surgeon: [name], MD;  Location: [facility];  Service: General;   Laterality: Right;  . BREAST SURGERY Right    breast bx  . CESAREAN SECTION  2005  . LAPAROSCOPIC SALPINGO OOPHERECTOMY Bilateral 10/22/2014   Procedure: LAPAROSCOPIC SALPINGO OOPHORECTOMY;  Surgeon: Nunzio Cobbs, MD;  Location: [facility];  Service: Gynecology;  Laterality: Bilateral;  BMI 41.82  . MASTECTOMY MODIFIED RADICAL Right 10/26/2012   Procedure: MASTECTOMY MODIFIED RADICAL;  Surgeon: [name], MD;  Location: [facility];  Service: General;  Laterality: Right;  . PORT-A-CATH REMOVAL Left 04/10/2014   Procedure: REMOVAL PORT-A-CATH;  Surgeon: [name], MD;  Location: [facility];  Service: General;  Laterality: Left;  . PORTACATH PLACEMENT Left 05/23/2012   Procedure: INSERTION PORT-A-CATH;  Surgeon: [name], MD;  Location: [facility];  Service: General;  Laterality: Left;  . RADIOLOGY WITH ANESTHESIA N/A 08/24/2013   Procedure: MRI;  Surgeon: Medication Radiologist, MD;  Location: [facility];  Service: Radiology;  Laterality: N/A;  . SIMPLE MASTECTOMY WITH AXILLARY SENTINEL NODE BIOPSY Left 10/26/2012   Procedure: TOTAL MASTECTOMY;  Surgeon: [name], MD;  Location: [facility];  Service: General;  Laterality: Left;  . TOTAL MASTECTOMY Left 10/26/2012   Dr Donne Hazel    Current Outpatient Prescriptions  Medication Sig Dispense Refill  . ALPRAZolam (XANAX) 0.25 MG tablet TAKE 1 TABLET BY MOUTH EVERY DAY AT BEDTIME AS NEEDED 30 tablet 1  . Ascorbic Acid (VITAMIN C PO)  Take 1 tablet by mouth daily.     Marland Kitchen CALCIUM PO Take 1 tablet by mouth daily.    Marland Kitchen gabapentin (NEURONTIN) 100 MG capsule Take 1 capsule (100 mg total) by mouth daily. 90 capsule 3  . gabapentin (NEURONTIN) 300 MG capsule Take 1 capsule (300 mg total) by mouth at bedtime. 90 capsule 3  . ibuprofen (ADVIL,MOTRIN) 800 MG tablet Take 1 tablet (800 mg total) by mouth every 8 (eight) hours as needed. 30 tablet 0  . letrozole (FEMARA) 2.5 MG tablet Take 1 tablet (2.5 mg total) by mouth daily. 90 tablet 1  .  Multiple Vitamin (MULTIVITAMIN) tablet Take 1 tablet by mouth daily.    . ondansetron (ZOFRAN) 4 MG tablet Take 1 tablet (4 mg total) by mouth daily as needed for nausea or vomiting. 20 tablet 0  . palbociclib (IBRANCE) 100 MG capsule Take 1 capsule (100 mg total) by mouth daily with breakfast. Take whole with food, 21 days, rest seven and repeat. 21 capsule 3  . psyllium (METAMUCIL) 58.6 % powder Take 1 packet by mouth daily. Reported on 07/26/2015    . traMADol (ULTRAM) 50 MG tablet TAKE 1 TABLET BY MOUTH EVERY 6 HOURS AS NEEDED FOR PAIN 30 tablet 1  . valACYclovir (VALTREX) 500 MG tablet Take 1 tablet (500 mg total) by mouth 2 (two) times daily. 60 tablet 1   No current facility-administered medications for this visit.     Family History  Problem Relation Age of Onset  . Adopted: Yes    ROS:  Pertinent items are noted in HPI.  Otherwise, a comprehensive ROS was negative.  Exam:   BP 136/64 (BP Location: Left Arm, Patient Position: Sitting, Cuff Size: Large)   Pulse 76   Resp 16   Ht 5\' 3"  (1.6 m)   Wt 224 lb (101.6 kg)   BMI 39.68 kg/m     General appearance: alert, cooperative and appears stated age Head: Normocephalic, without obvious abnormality, atraumatic Neck: no adenopathy, supple, symmetrical, trachea midline and thyroid normal to inspection and palpation Lungs: clear to auscultation bilaterally Breasts:  Absent breasts bilaterally.  Radiation changes on the right chest wall in axillary region. Heart: regular rate and rhythm Abdomen: soft, non-tender; no masses, no organomegaly Extremities: extremities normal, atraumatic, no cyanosis or edema Skin: Skin color, texture, turgor normal. No rashes or lesions Lymph nodes: Cervical, supraclavicular, and axillary nodes normal. No abnormal inguinal nodes palpated Neurologic: Grossly normal  Pelvic: External genitalia:  no lesions              Urethra:  normal appearing urethra with no masses, tenderness or lesions               Bartholins and Skenes: normal                 Vagina: normal appearing vagina with normal color and discharge, no lesions              Cervix: no lesions              Pap taken: No. Bimanual Exam:  Uterus:  normal size, contour, position, consistency, mobility, non-tender              Adnexa: no mass, fullness, tenderness              Rectal exam: Yes.  .  Confirms.              Anus:  normal sphincter tone, no lesions  Chaperone was present for exam.  Assessment:   Well woman visit with normal exam. Status post bilateral mastectomy for metastatic breast cancer, stage IV. On Femara and receives [place].  Diarrhea.  Status post laparoscopic BSO.   Plan: Mammogram screening discussed. Recommended self breast awareness. Pap and HR HPV as above. Guidelines for Calcium, Vitamin D, regular exercise program including cardiovascular and weight bearing exercise. Discussed hydration and importance of K supplementation - Gatorade, orange juice, bananas. Follow up annually and prn.       After visit summary provided.

## 2016-05-20 ENCOUNTER — Ambulatory Visit: Payer: BLUE CROSS/BLUE SHIELD | Admitting: Obstetrics and Gynecology

## 2016-05-20 ENCOUNTER — Encounter: Payer: Self-pay | Admitting: Obstetrics and Gynecology

## 2016-05-20 ENCOUNTER — Ambulatory Visit (INDEPENDENT_AMBULATORY_CARE_PROVIDER_SITE_OTHER): Payer: BLUE CROSS/BLUE SHIELD | Admitting: Obstetrics and Gynecology

## 2016-05-20 VITALS — BP 136/64 | HR 76 | Resp 16 | Ht 63.0 in | Wt 224.0 lb

## 2016-05-20 DIAGNOSIS — Z01419 Encounter for gynecological examination (general) (routine) without abnormal findings: Secondary | ICD-10-CM

## 2016-05-20 NOTE — Patient Instructions (Signed)

## 2016-05-28 ENCOUNTER — Other Ambulatory Visit (HOSPITAL_BASED_OUTPATIENT_CLINIC_OR_DEPARTMENT_OTHER): Payer: BLUE CROSS/BLUE SHIELD

## 2016-05-28 ENCOUNTER — Ambulatory Visit: Payer: BLUE CROSS/BLUE SHIELD

## 2016-05-28 DIAGNOSIS — Z170 Estrogen receptor positive status [ER+]: Principal | ICD-10-CM

## 2016-05-28 DIAGNOSIS — C50411 Malignant neoplasm of upper-outer quadrant of right female breast: Secondary | ICD-10-CM

## 2016-05-28 LAB — COMPREHENSIVE METABOLIC PANEL
ALBUMIN: 4.1 g/dL (ref 3.5–5.0)
ALK PHOS: 58 U/L (ref 40–150)
ALT: 25 U/L (ref 0–55)
ANION GAP: 9 meq/L (ref 3–11)
AST: 15 U/L (ref 5–34)
BILIRUBIN TOTAL: 0.3 mg/dL (ref 0.20–1.20)
BUN: 17 mg/dL (ref 7.0–26.0)
CO2: 23 mEq/L (ref 22–29)
Calcium: 9.9 mg/dL (ref 8.4–10.4)
Chloride: 110 mEq/L — ABNORMAL HIGH (ref 98–109)
Creatinine: 1.1 mg/dL (ref 0.6–1.1)
EGFR: 54 mL/min/{1.73_m2} — ABNORMAL LOW (ref >90)
GLUCOSE: 109 mg/dL (ref 70–140)
POTASSIUM: 4.8 meq/L (ref 3.5–5.1)
SODIUM: 142 meq/L (ref 136–145)
Total Protein: 7.2 g/dL (ref 6.4–8.3)

## 2016-05-28 LAB — CBC WITH DIFFERENTIAL/PLATELET
BASO%: 0.8 % (ref 0.0–2.0)
BASOS ABS: 0 10*3/uL (ref 0.0–0.1)
EOS ABS: 0 10*3/uL (ref 0.0–0.5)
EOS%: 0.8 % (ref 0.0–7.0)
HCT: 37.8 % (ref 34.8–46.6)
HEMOGLOBIN: 13.2 g/dL (ref 11.6–15.9)
LYMPH%: 47.1 % (ref 14.0–49.7)
MCH: 34.6 pg — AB (ref 25.1–34.0)
MCHC: 34.9 g/dL (ref 31.5–36.0)
MCV: 99 fL (ref 79.5–101.0)
MONO#: 0.3 10*3/uL (ref 0.1–0.9)
MONO%: 8 % (ref 0.0–14.0)
NEUT%: 43.3 % (ref 38.4–76.8)
NEUTROS ABS: 1.6 10*3/uL (ref 1.5–6.5)
PLATELETS: 185 10*3/uL (ref 145–400)
RBC: 3.82 10*6/uL (ref 3.70–5.45)
RDW: 14.6 % — AB (ref 11.2–14.5)
WBC: 3.6 10*3/uL — ABNORMAL LOW (ref 3.9–10.3)
lymph#: 1.7 10*3/uL (ref 0.9–3.3)

## 2016-05-29 ENCOUNTER — Ambulatory Visit (HOSPITAL_BASED_OUTPATIENT_CLINIC_OR_DEPARTMENT_OTHER): Payer: BLUE CROSS/BLUE SHIELD

## 2016-05-29 ENCOUNTER — Other Ambulatory Visit: Payer: Self-pay | Admitting: Oncology

## 2016-05-29 VITALS — BP 123/55 | HR 79 | Temp 97.4°F | Resp 18

## 2016-05-29 DIAGNOSIS — C7951 Secondary malignant neoplasm of bone: Secondary | ICD-10-CM | POA: Diagnosis not present

## 2016-05-29 DIAGNOSIS — C50411 Malignant neoplasm of upper-outer quadrant of right female breast: Secondary | ICD-10-CM | POA: Diagnosis not present

## 2016-05-29 MED ORDER — DENOSUMAB 120 MG/1.7ML ~~LOC~~ SOLN
120.0000 mg | Freq: Once | SUBCUTANEOUS | Status: AC
  Administered 2016-05-29: 120 mg via SUBCUTANEOUS
  Filled 2016-05-29: qty 1.7

## 2016-06-19 ENCOUNTER — Other Ambulatory Visit: Payer: Self-pay

## 2016-06-19 ENCOUNTER — Other Ambulatory Visit: Payer: Self-pay | Admitting: *Deleted

## 2016-06-19 DIAGNOSIS — C50411 Malignant neoplasm of upper-outer quadrant of right female breast: Secondary | ICD-10-CM

## 2016-06-19 MED ORDER — ONDANSETRON HCL 4 MG PO TABS: 4.0000 mg | ORAL_TABLET | Freq: Every day | ORAL | 0 refills | Status: DC | PRN

## 2016-06-19 MED ORDER — ALPRAZOLAM 0.25 MG PO TABS: ORAL_TABLET | ORAL | 1 refills | Status: DC

## 2016-06-19 MED ORDER — TRAMADOL HCL 50 MG PO TABS: 50.0000 mg | ORAL_TABLET | Freq: Four times a day (QID) | ORAL | 1 refills | Status: DC | PRN

## 2016-06-25 ENCOUNTER — Other Ambulatory Visit (HOSPITAL_BASED_OUTPATIENT_CLINIC_OR_DEPARTMENT_OTHER): Payer: BLUE CROSS/BLUE SHIELD

## 2016-06-25 ENCOUNTER — Ambulatory Visit (HOSPITAL_BASED_OUTPATIENT_CLINIC_OR_DEPARTMENT_OTHER): Payer: BLUE CROSS/BLUE SHIELD

## 2016-06-25 DIAGNOSIS — C7951 Secondary malignant neoplasm of bone: Secondary | ICD-10-CM

## 2016-06-25 DIAGNOSIS — Z170 Estrogen receptor positive status [ER+]: Principal | ICD-10-CM

## 2016-06-25 DIAGNOSIS — C50411 Malignant neoplasm of upper-outer quadrant of right female breast: Secondary | ICD-10-CM

## 2016-06-25 LAB — COMPREHENSIVE METABOLIC PANEL WITH GFR
ALT: 28 U/L (ref 0–55)
AST: 22 U/L (ref 5–34)
Albumin: 4 g/dL (ref 3.5–5.0)
Alkaline Phosphatase: 58 U/L (ref 40–150)
Anion Gap: 11 meq/L (ref 3–11)
BUN: 14.6 mg/dL (ref 7.0–26.0)
CO2: 21 meq/L — ABNORMAL LOW (ref 22–29)
Calcium: 9.2 mg/dL (ref 8.4–10.4)
Chloride: 109 meq/L (ref 98–109)
Creatinine: 1.2 mg/dL — ABNORMAL HIGH (ref 0.6–1.1)
EGFR: 51 ml/min/1.73 m2 — ABNORMAL LOW (ref >90)
Glucose: 136 mg/dL (ref 70–140)
Potassium: 4.1 meq/L (ref 3.5–5.1)
Sodium: 141 meq/L (ref 136–145)
Total Bilirubin: 0.39 mg/dL (ref 0.20–1.20)
Total Protein: 7 g/dL (ref 6.4–8.3)

## 2016-06-25 LAB — CBC WITH DIFFERENTIAL/PLATELET
BASO%: 1.3 % (ref 0.0–2.0)
Basophils Absolute: 0 10*3/uL (ref 0.0–0.1)
EOS ABS: 0 10*3/uL (ref 0.0–0.5)
EOS%: 1.2 % (ref 0.0–7.0)
HEMATOCRIT: 39.5 % (ref 34.8–46.6)
HEMOGLOBIN: 13.6 g/dL (ref 11.6–15.9)
LYMPH#: 1.3 10*3/uL (ref 0.9–3.3)
LYMPH%: 41.4 % (ref 14.0–49.7)
MCH: 35.3 pg — ABNORMAL HIGH (ref 25.1–34.0)
MCHC: 34.5 g/dL (ref 31.5–36.0)
MCV: 102.4 fL — AB (ref 79.5–101.0)
MONO#: 0.2 10*3/uL (ref 0.1–0.9)
MONO%: 7.4 % (ref 0.0–14.0)
NEUT%: 48.7 % (ref 38.4–76.8)
NEUTROS ABS: 1.5 10*3/uL (ref 1.5–6.5)
PLATELETS: 256 10*3/uL (ref 145–400)
RBC: 3.86 10*6/uL (ref 3.70–5.45)
RDW: 15.5 % — ABNORMAL HIGH (ref 11.2–14.5)
WBC: 3.1 10*3/uL — AB (ref 3.9–10.3)

## 2016-06-25 MED ORDER — DENOSUMAB 120 MG/1.7ML ~~LOC~~ SOLN
120.0000 mg | Freq: Once | SUBCUTANEOUS | Status: AC
  Administered 2016-06-25: 120 mg via SUBCUTANEOUS
  Filled 2016-06-25: qty 1.7

## 2016-06-26 ENCOUNTER — Other Ambulatory Visit: Payer: Self-pay | Admitting: *Deleted

## 2016-07-23 ENCOUNTER — Ambulatory Visit (HOSPITAL_BASED_OUTPATIENT_CLINIC_OR_DEPARTMENT_OTHER): Payer: BLUE CROSS/BLUE SHIELD

## 2016-07-23 ENCOUNTER — Other Ambulatory Visit (HOSPITAL_BASED_OUTPATIENT_CLINIC_OR_DEPARTMENT_OTHER): Payer: BLUE CROSS/BLUE SHIELD

## 2016-07-23 DIAGNOSIS — C7951 Secondary malignant neoplasm of bone: Secondary | ICD-10-CM | POA: Diagnosis not present

## 2016-07-23 DIAGNOSIS — C50411 Malignant neoplasm of upper-outer quadrant of right female breast: Secondary | ICD-10-CM | POA: Diagnosis not present

## 2016-07-23 DIAGNOSIS — Z170 Estrogen receptor positive status [ER+]: Principal | ICD-10-CM

## 2016-07-23 LAB — CBC WITH DIFFERENTIAL/PLATELET
BASO%: 0.2 % (ref 0.0–2.0)
BASOS ABS: 0 10*3/uL (ref 0.0–0.1)
EOS ABS: 0.1 10*3/uL (ref 0.0–0.5)
EOS%: 1.9 % (ref 0.0–7.0)
HCT: 38.5 % (ref 34.8–46.6)
HEMOGLOBIN: 13.3 g/dL (ref 11.6–15.9)
LYMPH%: 24.5 % (ref 14.0–49.7)
MCH: 35.6 pg — ABNORMAL HIGH (ref 25.1–34.0)
MCHC: 34.6 g/dL (ref 31.5–36.0)
MCV: 102.8 fL — AB (ref 79.5–101.0)
MONO#: 0.3 10*3/uL (ref 0.1–0.9)
MONO%: 5.7 % (ref 0.0–14.0)
NEUT%: 67.7 % (ref 38.4–76.8)
NEUTROS ABS: 3.7 10*3/uL (ref 1.5–6.5)
PLATELETS: 252 10*3/uL (ref 145–400)
RBC: 3.74 10*6/uL (ref 3.70–5.45)
RDW: 14.6 % — ABNORMAL HIGH (ref 11.2–14.5)
WBC: 5.5 10*3/uL (ref 3.9–10.3)
lymph#: 1.4 10*3/uL (ref 0.9–3.3)

## 2016-07-23 LAB — COMPREHENSIVE METABOLIC PANEL
ALK PHOS: 66 U/L (ref 40–150)
ALT: 35 U/L (ref 0–55)
AST: 20 U/L (ref 5–34)
Albumin: 3.7 g/dL (ref 3.5–5.0)
Anion Gap: 10 mEq/L (ref 3–11)
BILIRUBIN TOTAL: 0.3 mg/dL (ref 0.20–1.20)
BUN: 15.1 mg/dL (ref 7.0–26.0)
CO2: 23 mEq/L (ref 22–29)
Calcium: 9.6 mg/dL (ref 8.4–10.4)
Chloride: 106 mEq/L (ref 98–109)
Creatinine: 1.2 mg/dL — ABNORMAL HIGH (ref 0.6–1.1)
EGFR: 51 mL/min/{1.73_m2} — AB (ref >90)
Glucose: 271 mg/dl — ABNORMAL HIGH (ref 70–140)
Potassium: 3.8 mEq/L (ref 3.5–5.1)
Sodium: 140 mEq/L (ref 136–145)
TOTAL PROTEIN: 6.9 g/dL (ref 6.4–8.3)

## 2016-07-23 MED ORDER — DENOSUMAB 120 MG/1.7ML ~~LOC~~ SOLN
120.0000 mg | Freq: Once | SUBCUTANEOUS | Status: AC
  Administered 2016-07-23: 120 mg via SUBCUTANEOUS
  Filled 2016-07-23: qty 1.7

## 2016-07-23 NOTE — Patient Instructions (Signed)

## 2016-08-19 ENCOUNTER — Ambulatory Visit (HOSPITAL_COMMUNITY)
Admission: RE | Admit: 2016-08-19 | Discharge: 2016-08-19 | Disposition: A | Discharge status: Home / Self Care | Payer: BLUE CROSS/BLUE SHIELD | Source: Ambulatory Visit | Attending: Oncology | Admitting: Oncology

## 2016-08-19 DIAGNOSIS — Z7189 Other specified counseling: Secondary | ICD-10-CM

## 2016-08-19 DIAGNOSIS — C50919 Malignant neoplasm of unspecified site of unspecified female breast: Secondary | ICD-10-CM

## 2016-08-19 DIAGNOSIS — C50411 Malignant neoplasm of upper-outer quadrant of right female breast: Secondary | ICD-10-CM | POA: Diagnosis present

## 2016-08-19 DIAGNOSIS — C7951 Secondary malignant neoplasm of bone: Secondary | ICD-10-CM | POA: Insufficient documentation

## 2016-08-19 DIAGNOSIS — Z170 Estrogen receptor positive status [ER+]: Secondary | ICD-10-CM | POA: Insufficient documentation

## 2016-08-19 DIAGNOSIS — C7801 Secondary malignant neoplasm of right lung: Secondary | ICD-10-CM | POA: Diagnosis not present

## 2016-08-20 ENCOUNTER — Ambulatory Visit (HOSPITAL_BASED_OUTPATIENT_CLINIC_OR_DEPARTMENT_OTHER): Payer: BLUE CROSS/BLUE SHIELD

## 2016-08-20 ENCOUNTER — Ambulatory Visit (HOSPITAL_BASED_OUTPATIENT_CLINIC_OR_DEPARTMENT_OTHER): Payer: BLUE CROSS/BLUE SHIELD | Admitting: Oncology

## 2016-08-20 ENCOUNTER — Other Ambulatory Visit (HOSPITAL_BASED_OUTPATIENT_CLINIC_OR_DEPARTMENT_OTHER): Payer: BLUE CROSS/BLUE SHIELD

## 2016-08-20 VITALS — BP 135/73 | HR 78 | Temp 98.4°F | Resp 18 | Ht 63.0 in | Wt 217.3 lb

## 2016-08-20 DIAGNOSIS — Z171 Estrogen receptor negative status [ER-]: Secondary | ICD-10-CM

## 2016-08-20 DIAGNOSIS — Z170 Estrogen receptor positive status [ER+]: Principal | ICD-10-CM

## 2016-08-20 DIAGNOSIS — C7951 Secondary malignant neoplasm of bone: Secondary | ICD-10-CM | POA: Diagnosis not present

## 2016-08-20 DIAGNOSIS — C50411 Malignant neoplasm of upper-outer quadrant of right female breast: Secondary | ICD-10-CM | POA: Diagnosis not present

## 2016-08-20 DIAGNOSIS — C7800 Secondary malignant neoplasm of unspecified lung: Secondary | ICD-10-CM

## 2016-08-20 LAB — CBC WITH DIFFERENTIAL/PLATELET
BASO%: 0.7 % (ref 0.0–2.0)
Basophils Absolute: 0 10*3/uL (ref 0.0–0.1)
EOS%: 1.3 % (ref 0.0–7.0)
Eosinophils Absolute: 0.1 10*3/uL (ref 0.0–0.5)
HCT: 39.9 % (ref 34.8–46.6)
HEMOGLOBIN: 13.8 g/dL (ref 11.6–15.9)
LYMPH%: 33.8 % (ref 14.0–49.7)
MCH: 34.8 pg — ABNORMAL HIGH (ref 25.1–34.0)
MCHC: 34.7 g/dL (ref 31.5–36.0)
MCV: 100.4 fL (ref 79.5–101.0)
MONO#: 0.4 10*3/uL (ref 0.1–0.9)
MONO%: 7.6 % (ref 0.0–14.0)
NEUT%: 56.6 % (ref 38.4–76.8)
NEUTROS ABS: 2.9 10*3/uL (ref 1.5–6.5)
Platelets: 234 10*3/uL (ref 145–400)
RBC: 3.97 10*6/uL (ref 3.70–5.45)
RDW: 13.9 % (ref 11.2–14.5)
WBC: 5.2 10*3/uL (ref 3.9–10.3)
lymph#: 1.7 10*3/uL (ref 0.9–3.3)

## 2016-08-20 LAB — COMPREHENSIVE METABOLIC PANEL
ALBUMIN: 3.8 g/dL (ref 3.5–5.0)
ALT: 31 U/L (ref 0–55)
AST: 20 U/L (ref 5–34)
Alkaline Phosphatase: 68 U/L (ref 40–150)
Anion Gap: 11 mEq/L (ref 3–11)
BILIRUBIN TOTAL: 0.44 mg/dL (ref 0.20–1.20)
BUN: 15 mg/dL (ref 7.0–26.0)
CO2: 21 meq/L — AB (ref 22–29)
Calcium: 9.8 mg/dL (ref 8.4–10.4)
Chloride: 109 mEq/L (ref 98–109)
Creatinine: 1 mg/dL (ref 0.6–1.1)
EGFR: 63 mL/min/{1.73_m2} — AB (ref >90)
GLUCOSE: 138 mg/dL (ref 70–140)
Potassium: 3.8 mEq/L (ref 3.5–5.1)
SODIUM: 141 meq/L (ref 136–145)
TOTAL PROTEIN: 6.9 g/dL (ref 6.4–8.3)

## 2016-08-20 MED ORDER — DENOSUMAB 120 MG/1.7ML ~~LOC~~ SOLN
120.0000 mg | Freq: Once | SUBCUTANEOUS | Status: AC
  Administered 2016-08-20: 120 mg via SUBCUTANEOUS
  Filled 2016-08-20: qty 1.7
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
## 2016-09-17 ENCOUNTER — Ambulatory Visit: Payer: BLUE CROSS/BLUE SHIELD

## 2016-09-17 ENCOUNTER — Other Ambulatory Visit: Payer: BLUE CROSS/BLUE SHIELD

## 2016-09-22 ENCOUNTER — Other Ambulatory Visit (HOSPITAL_BASED_OUTPATIENT_CLINIC_OR_DEPARTMENT_OTHER): Payer: BLUE CROSS/BLUE SHIELD

## 2016-09-22 ENCOUNTER — Encounter (INDEPENDENT_AMBULATORY_CARE_PROVIDER_SITE_OTHER): Payer: Self-pay

## 2016-09-22 DIAGNOSIS — C50411 Malignant neoplasm of upper-outer quadrant of right female breast: Secondary | ICD-10-CM

## 2016-09-22 DIAGNOSIS — Z170 Estrogen receptor positive status [ER+]: Principal | ICD-10-CM

## 2016-09-22 LAB — CBC WITH DIFFERENTIAL/PLATELET
BASO%: 0.2 % (ref 0.0–2.0)
BASOS ABS: 0 10*3/uL (ref 0.0–0.1)
EOS ABS: 0.1 10*3/uL (ref 0.0–0.5)
EOS%: 1.5 % (ref 0.0–7.0)
HCT: 38.1 % (ref 34.8–46.6)
HGB: 13.1 g/dL (ref 11.6–15.9)
LYMPH%: 37.5 % (ref 14.0–49.7)
MCH: 34.6 pg — AB (ref 25.1–34.0)
MCHC: 34.4 g/dL (ref 31.5–36.0)
MCV: 100.5 fL (ref 79.5–101.0)
MONO#: 0.3 10*3/uL (ref 0.1–0.9)
MONO%: 6.8 % (ref 0.0–14.0)
NEUT#: 2.5 10*3/uL (ref 1.5–6.5)
NEUT%: 54 % (ref 38.4–76.8)
PLATELETS: 235 10*3/uL (ref 145–400)
RBC: 3.79 10*6/uL (ref 3.70–5.45)
RDW: 13.4 % (ref 11.2–14.5)
WBC: 4.7 10*3/uL (ref 3.9–10.3)
lymph#: 1.8 10*3/uL (ref 0.9–3.3)

## 2016-09-22 LAB — COMPREHENSIVE METABOLIC PANEL
ALT: 28 U/L (ref 0–55)
ANION GAP: 8 meq/L (ref 3–11)
AST: 17 U/L (ref 5–34)
Albumin: 3.6 g/dL (ref 3.5–5.0)
Alkaline Phosphatase: 61 U/L (ref 40–150)
BUN: 15.1 mg/dL (ref 7.0–26.0)
CO2: 22 meq/L (ref 22–29)
Calcium: 9.8 mg/dL (ref 8.4–10.4)
Chloride: 110 mEq/L — ABNORMAL HIGH (ref 98–109)
Creatinine: 1 mg/dL (ref 0.6–1.1)
EGFR: 60 mL/min/{1.73_m2} — AB (ref >90)
GLUCOSE: 121 mg/dL (ref 70–140)
POTASSIUM: 4.2 meq/L (ref 3.5–5.1)
SODIUM: 140 meq/L (ref 136–145)
Total Bilirubin: 0.28 mg/dL (ref 0.20–1.20)
Total Protein: 6.8 g/dL (ref 6.4–8.3)

## 2016-09-28 IMAGING — CT CT CHEST W/ CM
2 of 4 series · 15 of 36 positions shown, 18 images · IV contrast (OMNIPAQUE)
Comparison: PET-CT 05/29/2014

CLINICAL DATA: Restaging breast cancer.

EXAM:
CT CHEST WITH CONTRAST
TECHNIQUE: Multidetector CT imaging of the chest was performed during
intravenous contrast administration.
CONTRAST:  75mL OMNIPAQUE IOHEXOL 300 MG/ML  SOLN

[Series 2: chest with st · axial · 0.74mm/px · z∈[+1184,+1439]mm · 12 of 61 slices shown, 15 images]
[im 5/61  mediastinal]
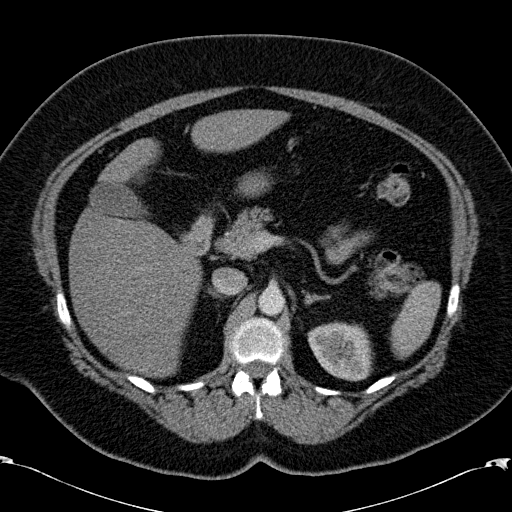
[im 5/61  lung]
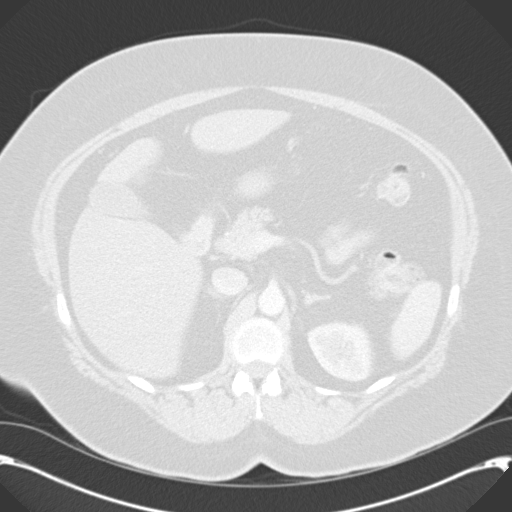
[im 9/61  lung]
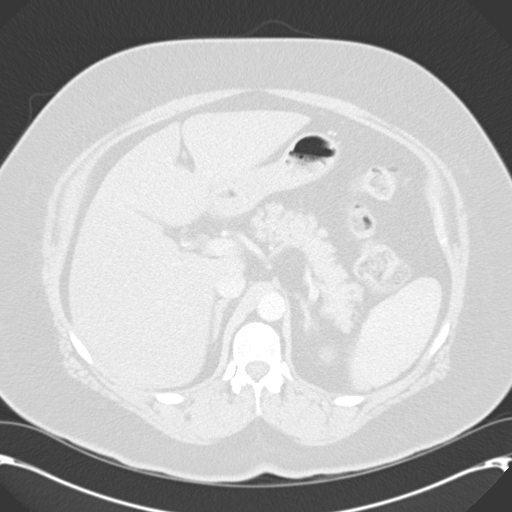
[im 13/61  lung]
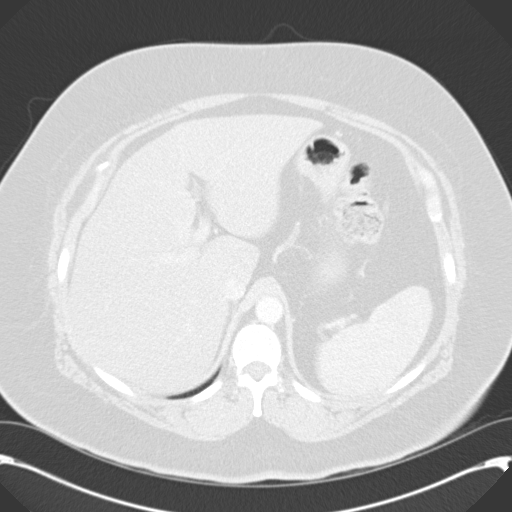
[im 18/61  lung]
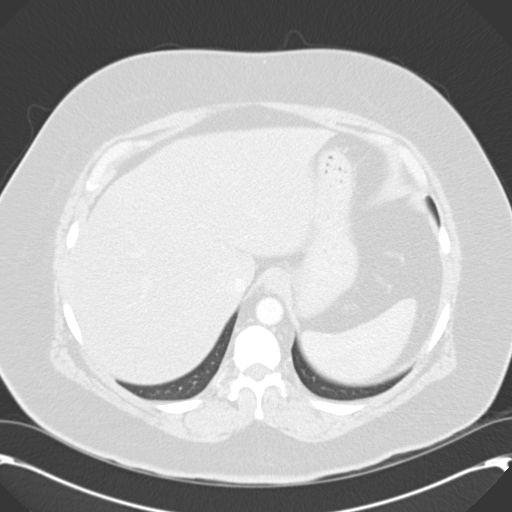
[im 22/61  mediastinal]
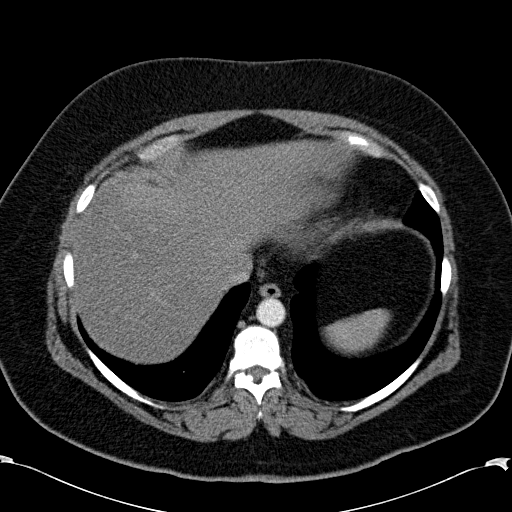
[im 22/61  lung]
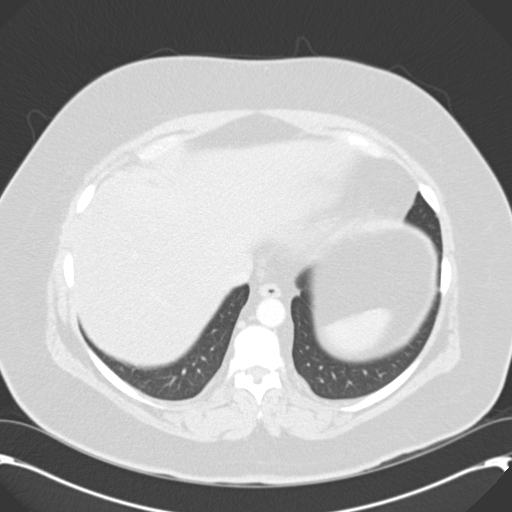
[im 26/61  lung]
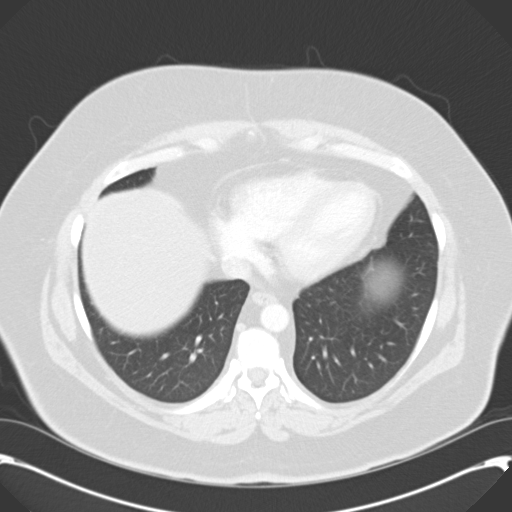
[im 35/61  lung]
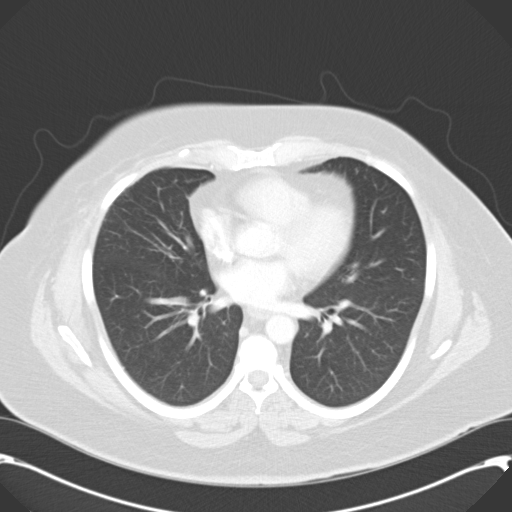
[im 39/61  lung]
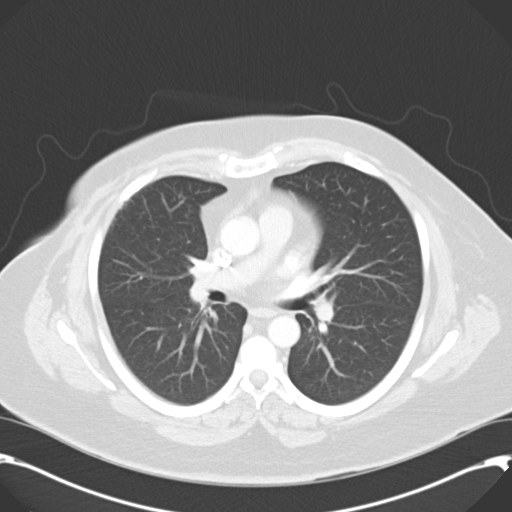
[im 43/61  mediastinal]
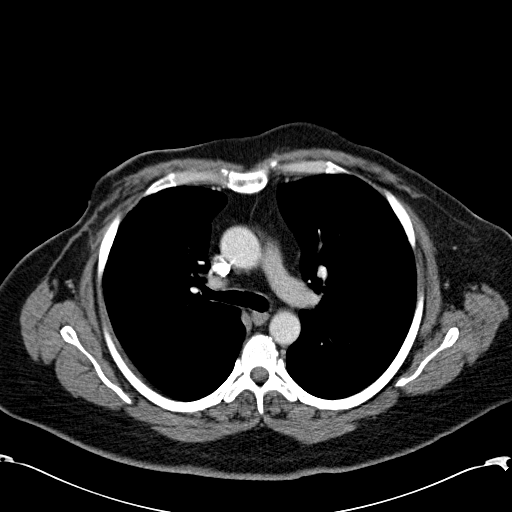
[im 43/61  lung]
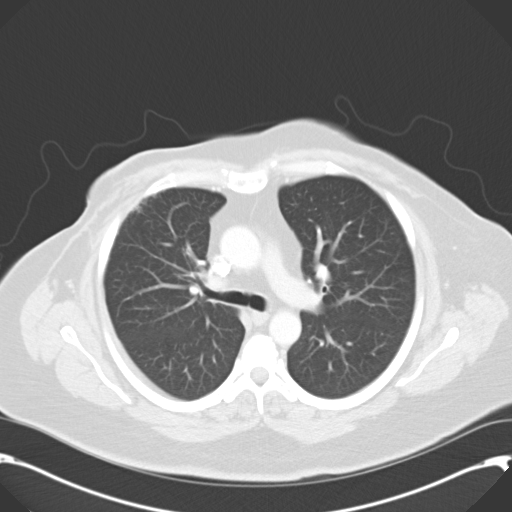
[im 48/61  lung]
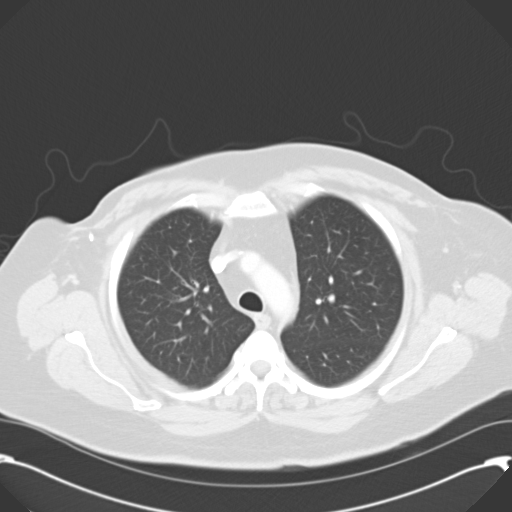
[im 52/61  lung]
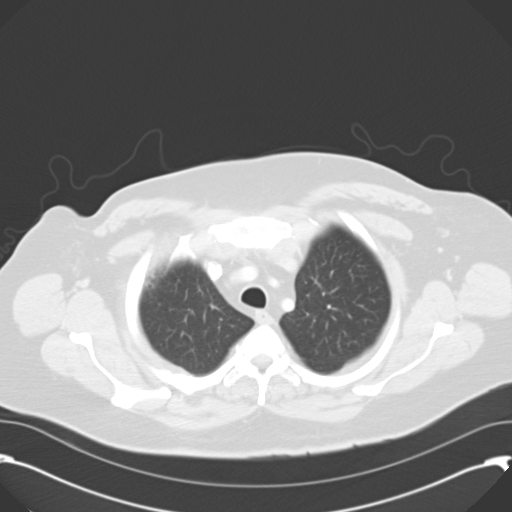
[im 56/61  lung]
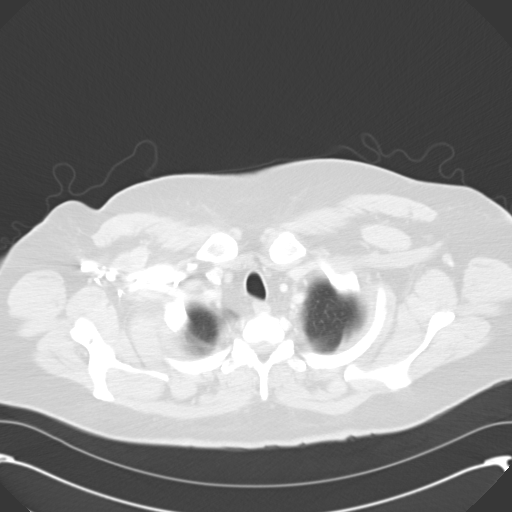

[Series 602: <mpr thick range> · coronal · 0.74mm/px · 3 of 84 slices shown]
[im 17/84  lung]
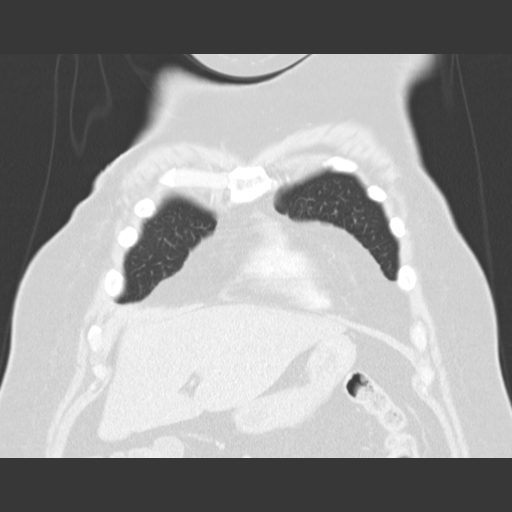
[im 34/84  lung]
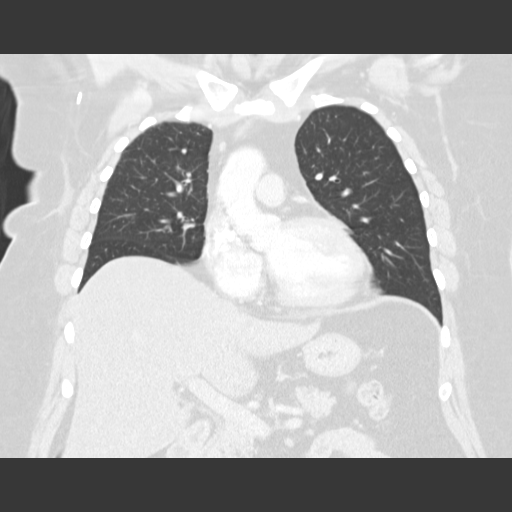
[im 50/84  lung]
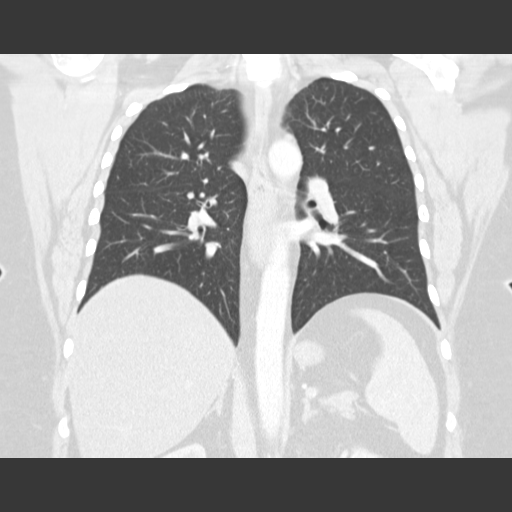

[15 of 36 positions shown; findings below may reference images not displayed]

FINDINGS: Mediastinum: The heart size is normal. Trachea appears patent and is
midline. The esophagus appears normal. No mediastinal or hilar
adenopathy. No axillary or supraclavicular adenopathy. Status post
right mastectomy and right axillary nodal dissection.

Lungs/Pleura: There is no pleural fluid identified. No airspace
consolidation or atelectasis. No suspicious pulmonary nodule or mass
identified.

Upper Abdomen: The scratch set there is hepatic steatosis noted. No
focal liver abnormality identified. The visualized portions of the
spleen are normal. The adrenal glands are unremarkable.

Musculoskeletal: Multifocal areas of sclerotic bone metastases are
identified throughout the thoracic spine. Compared with previous
exam the appearance is stable.
IMPRESSION: 1. No evidence for mass or adenopathy. No specific findings
identified to suggest recurrence of tumor.
2. Stable areas of abnormal sclerosis within the thoracic spine
compatible with treated bone metastases.

## 2016-10-13 ENCOUNTER — Other Ambulatory Visit: Payer: Self-pay

## 2016-10-13 ENCOUNTER — Encounter: Payer: Self-pay | Admitting: Internal Medicine

## 2016-10-13 MED ORDER — GABAPENTIN 300 MG PO CAPS: 300.0000 mg | ORAL_CAPSULE | Freq: Every day | ORAL | 3 refills | Status: DC

## 2016-10-15 ENCOUNTER — Ambulatory Visit (HOSPITAL_BASED_OUTPATIENT_CLINIC_OR_DEPARTMENT_OTHER): Payer: BLUE CROSS/BLUE SHIELD

## 2016-10-15 ENCOUNTER — Other Ambulatory Visit (HOSPITAL_BASED_OUTPATIENT_CLINIC_OR_DEPARTMENT_OTHER): Payer: BLUE CROSS/BLUE SHIELD

## 2016-10-15 VITALS — BP 135/68 | HR 72 | Temp 98.3°F | Resp 18

## 2016-10-15 DIAGNOSIS — C50411 Malignant neoplasm of upper-outer quadrant of right female breast: Secondary | ICD-10-CM

## 2016-10-15 DIAGNOSIS — C7951 Secondary malignant neoplasm of bone: Secondary | ICD-10-CM | POA: Diagnosis not present

## 2016-10-15 DIAGNOSIS — Z170 Estrogen receptor positive status [ER+]: Principal | ICD-10-CM

## 2016-10-15 LAB — COMPREHENSIVE METABOLIC PANEL
ALBUMIN: 4.1 g/dL (ref 3.5–5.0)
ALK PHOS: 61 U/L (ref 40–150)
ALT: 31 U/L (ref 0–55)
AST: 19 U/L (ref 5–34)
Anion Gap: 10 mEq/L (ref 3–11)
BILIRUBIN TOTAL: 0.34 mg/dL (ref 0.20–1.20)
BUN: 15.4 mg/dL (ref 7.0–26.0)
CALCIUM: 9.3 mg/dL (ref 8.4–10.4)
CO2: 21 mEq/L — ABNORMAL LOW (ref 22–29)
CREATININE: 0.9 mg/dL (ref 0.6–1.1)
Chloride: 108 mEq/L (ref 98–109)
EGFR: 73 mL/min/{1.73_m2} — ABNORMAL LOW (ref >90)
GLUCOSE: 104 mg/dL (ref 70–140)
POTASSIUM: 4.1 meq/L (ref 3.5–5.1)
SODIUM: 139 meq/L (ref 136–145)
TOTAL PROTEIN: 7.4 g/dL (ref 6.4–8.3)

## 2016-10-15 LAB — CBC WITH DIFFERENTIAL/PLATELET
BASO%: 1.3 % (ref 0.0–2.0)
BASOS ABS: 0 10*3/uL (ref 0.0–0.1)
EOS%: 0.4 % (ref 0.0–7.0)
Eosinophils Absolute: 0 10*3/uL (ref 0.0–0.5)
HEMATOCRIT: 39.3 % (ref 34.8–46.6)
HEMOGLOBIN: 13.7 g/dL (ref 11.6–15.9)
LYMPH#: 1.8 10*3/uL (ref 0.9–3.3)
LYMPH%: 54.9 % — ABNORMAL HIGH (ref 14.0–49.7)
MCH: 34.9 pg — ABNORMAL HIGH (ref 25.1–34.0)
MCHC: 35 g/dL (ref 31.5–36.0)
MCV: 99.8 fL (ref 79.5–101.0)
MONO#: 0.3 10*3/uL (ref 0.1–0.9)
MONO%: 10.3 % (ref 0.0–14.0)
NEUT%: 33.1 % — ABNORMAL LOW (ref 38.4–76.8)
NEUTROS ABS: 1.1 10*3/uL — AB (ref 1.5–6.5)
Platelets: 280 10*3/uL (ref 145–400)
RBC: 3.94 10*6/uL (ref 3.70–5.45)
RDW: 14.1 % (ref 11.2–14.5)
WBC: 3.2 10*3/uL — AB (ref 3.9–10.3)

## 2016-10-15 MED ORDER — DENOSUMAB 120 MG/1.7ML ~~LOC~~ SOLN
120.0000 mg | Freq: Once | SUBCUTANEOUS | Status: AC
  Administered 2016-10-15: 120 mg via SUBCUTANEOUS
  Filled 2016-10-15: qty 1.7

## 2016-10-16 ENCOUNTER — Telehealth: Payer: Self-pay

## 2016-10-16 NOTE — Telephone Encounter (Signed)
Called pt to see when she will restart Ibrance medication. Pt to start [facility] on tues 9/25. Reviewed pt ANC 1.1 and okay with Lindsey,NP to have pt restart medication and come back for lab work in 2 weeks. Pt verbalized understanding and will schedule for lab appt for 10/30/16 at 3pm. No further questions at this time.

## 2016-10-23 ENCOUNTER — Other Ambulatory Visit: Payer: Self-pay | Admitting: *Deleted

## 2016-10-30 ENCOUNTER — Other Ambulatory Visit (HOSPITAL_BASED_OUTPATIENT_CLINIC_OR_DEPARTMENT_OTHER): Payer: BLUE CROSS/BLUE SHIELD

## 2016-10-30 DIAGNOSIS — Z170 Estrogen receptor positive status [ER+]: Principal | ICD-10-CM

## 2016-10-30 DIAGNOSIS — C50411 Malignant neoplasm of upper-outer quadrant of right female breast: Secondary | ICD-10-CM

## 2016-10-30 LAB — CBC WITH DIFFERENTIAL/PLATELET
BASO%: 2.5 % — AB (ref 0.0–2.0)
Basophils Absolute: 0.1 10*3/uL (ref 0.0–0.1)
EOS ABS: 0 10*3/uL (ref 0.0–0.5)
EOS%: 0.4 % (ref 0.0–7.0)
HEMATOCRIT: 40.6 % (ref 34.8–46.6)
HEMOGLOBIN: 14 g/dL (ref 11.6–15.9)
LYMPH#: 1.6 10*3/uL (ref 0.9–3.3)
LYMPH%: 38.3 % (ref 14.0–49.7)
MCH: 34.3 pg — ABNORMAL HIGH (ref 25.1–34.0)
MCHC: 34.4 g/dL (ref 31.5–36.0)
MCV: 99.8 fL (ref 79.5–101.0)
MONO#: 0.2 10*3/uL (ref 0.1–0.9)
MONO%: 4.1 % (ref 0.0–14.0)
NEUT%: 54.7 % (ref 38.4–76.8)
NEUTROS ABS: 2.3 10*3/uL (ref 1.5–6.5)
PLATELETS: 307 10*3/uL (ref 145–400)
RBC: 4.07 10*6/uL (ref 3.70–5.45)
RDW: 15.1 % — AB (ref 11.2–14.5)
WBC: 4.2 10*3/uL (ref 3.9–10.3)

## 2016-10-30 LAB — COMPREHENSIVE METABOLIC PANEL
ALBUMIN: 4.1 g/dL (ref 3.5–5.0)
ALT: 22 U/L (ref 0–55)
ANION GAP: 11 meq/L (ref 3–11)
AST: 15 U/L (ref 5–34)
Alkaline Phosphatase: 56 U/L (ref 40–150)
BILIRUBIN TOTAL: 0.3 mg/dL (ref 0.20–1.20)
BUN: 17.5 mg/dL (ref 7.0–26.0)
CHLORIDE: 108 meq/L (ref 98–109)
CO2: 20 meq/L — AB (ref 22–29)
Calcium: 9.7 mg/dL (ref 8.4–10.4)
Creatinine: 1 mg/dL (ref 0.6–1.1)
EGFR: 63 mL/min/{1.73_m2} — AB (ref >90)
Glucose: 181 mg/dl — ABNORMAL HIGH (ref 70–140)
POTASSIUM: 4 meq/L (ref 3.5–5.1)
Sodium: 139 mEq/L (ref 136–145)
TOTAL PROTEIN: 7.2 g/dL (ref 6.4–8.3)

## 2016-11-03 ENCOUNTER — Encounter: Payer: Self-pay | Admitting: Internal Medicine

## 2016-11-11 ENCOUNTER — Telehealth: Payer: Self-pay

## 2016-11-11 NOTE — Telephone Encounter (Signed)
, °
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
## 2016-11-12 ENCOUNTER — Ambulatory Visit (HOSPITAL_COMMUNITY)
Admission: RE | Admit: 2016-11-12 | Discharge: 2016-11-12 | Disposition: A | Discharge status: Home / Self Care | Payer: BLUE CROSS/BLUE SHIELD | Source: Ambulatory Visit | Attending: Oncology | Admitting: Oncology

## 2016-11-12 ENCOUNTER — Telehealth: Payer: Self-pay | Admitting: Oncology

## 2016-11-12 ENCOUNTER — Ambulatory Visit (HOSPITAL_BASED_OUTPATIENT_CLINIC_OR_DEPARTMENT_OTHER): Payer: BLUE CROSS/BLUE SHIELD

## 2016-11-12 ENCOUNTER — Other Ambulatory Visit (HOSPITAL_BASED_OUTPATIENT_CLINIC_OR_DEPARTMENT_OTHER): Payer: BLUE CROSS/BLUE SHIELD

## 2016-11-12 ENCOUNTER — Ambulatory Visit (HOSPITAL_BASED_OUTPATIENT_CLINIC_OR_DEPARTMENT_OTHER): Payer: BLUE CROSS/BLUE SHIELD | Admitting: Oncology

## 2016-11-12 ENCOUNTER — Telehealth: Payer: Self-pay

## 2016-11-12 VITALS — BP 119/70 | HR 74 | Temp 98.4°F | Resp 20 | Ht 63.0 in | Wt 216.5 lb

## 2016-11-12 DIAGNOSIS — C7800 Secondary malignant neoplasm of unspecified lung: Secondary | ICD-10-CM

## 2016-11-12 DIAGNOSIS — M47816 Spondylosis without myelopathy or radiculopathy, lumbar region: Secondary | ICD-10-CM | POA: Diagnosis not present

## 2016-11-12 DIAGNOSIS — C7951 Secondary malignant neoplasm of bone: Secondary | ICD-10-CM

## 2016-11-12 DIAGNOSIS — C50411 Malignant neoplasm of upper-outer quadrant of right female breast: Secondary | ICD-10-CM

## 2016-11-12 DIAGNOSIS — Z170 Estrogen receptor positive status [ER+]: Secondary | ICD-10-CM

## 2016-11-12 DIAGNOSIS — M160 Bilateral primary osteoarthritis of hip: Secondary | ICD-10-CM | POA: Diagnosis not present

## 2016-11-12 DIAGNOSIS — Z171 Estrogen receptor negative status [ER-]: Secondary | ICD-10-CM | POA: Insufficient documentation

## 2016-11-12 DIAGNOSIS — C50919 Malignant neoplasm of unspecified site of unspecified female breast: Secondary | ICD-10-CM

## 2016-11-12 DIAGNOSIS — M899 Disorder of bone, unspecified: Secondary | ICD-10-CM | POA: Diagnosis not present

## 2016-11-12 LAB — COMPREHENSIVE METABOLIC PANEL
ALBUMIN: 4 g/dL (ref 3.5–5.0)
ALK PHOS: 56 U/L (ref 40–150)
ALT: 27 U/L (ref 0–55)
AST: 18 U/L (ref 5–34)
Anion Gap: 10 mEq/L (ref 3–11)
BILIRUBIN TOTAL: 0.29 mg/dL (ref 0.20–1.20)
BUN: 12 mg/dL (ref 7.0–26.0)
CALCIUM: 9.7 mg/dL (ref 8.4–10.4)
CO2: 21 mEq/L — ABNORMAL LOW (ref 22–29)
Chloride: 109 mEq/L (ref 98–109)
Creatinine: 1 mg/dL (ref 0.6–1.1)
EGFR: >60 mL/min/{1.73_m2} (ref >60)
GLUCOSE: 128 mg/dL (ref 70–140)
POTASSIUM: 4.3 meq/L (ref 3.5–5.1)
SODIUM: 140 meq/L (ref 136–145)
TOTAL PROTEIN: 7.1 g/dL (ref 6.4–8.3)

## 2016-11-12 LAB — CBC WITH DIFFERENTIAL/PLATELET
BASO%: 0.3 % (ref 0.0–2.0)
BASOS ABS: 0 10*3/uL (ref 0.0–0.1)
EOS ABS: 0 10*3/uL (ref 0.0–0.5)
EOS%: 0.9 % (ref 0.0–7.0)
HEMATOCRIT: 39.2 % (ref 34.8–46.6)
HEMOGLOBIN: 13.8 g/dL (ref 11.6–15.9)
LYMPH#: 1.6 10*3/uL (ref 0.9–3.3)
LYMPH%: 46.8 % (ref 14.0–49.7)
MCH: 34.9 pg — AB (ref 25.1–34.0)
MCHC: 35.2 g/dL (ref 31.5–36.0)
MCV: 99.2 fL (ref 79.5–101.0)
MONO#: 0.2 10*3/uL (ref 0.1–0.9)
MONO%: 5.3 % (ref 0.0–14.0)
NEUT%: 46.7 % (ref 38.4–76.8)
NEUTROS ABS: 1.6 10*3/uL (ref 1.5–6.5)
Platelets: 195 10*3/uL (ref 145–400)
RBC: 3.95 10*6/uL (ref 3.70–5.45)
RDW: 14.4 % (ref 11.2–14.5)
WBC: 3.4 10*3/uL — AB (ref 3.9–10.3)

## 2016-11-12 MED ORDER — DENOSUMAB 120 MG/1.7ML ~~LOC~~ SOLN
120.0000 mg | Freq: Once | SUBCUTANEOUS | Status: AC
  Administered 2016-11-12: 120 mg via SUBCUTANEOUS
  Filled 2016-11-12: qty 1.7

## 2016-11-12 MED ORDER — TRAMADOL HCL 50 MG PO TABS: 50.0000 mg | ORAL_TABLET | Freq: Four times a day (QID) | ORAL | 1 refills | Status: DC | PRN

## 2016-11-12 NOTE — Telephone Encounter (Signed)
When called pt back she had already spoken with someone.

## 2016-11-12 NOTE — Telephone Encounter (Signed)
Gave patient avs and calendar with appointments per 10/18 los.

## 2016-11-12 NOTE — Patient Instructions (Signed)
Denosumab injection What is this medicine? DENOSUMAB (den oh sue mab) slows bone breakdown. Prolia is used to treat osteoporosis in women after menopause and in men. Xgeva is used to prevent bone fractures and other bone problems caused by cancer bone metastases. Xgeva is also used to treat giant cell tumor of the bone. This medicine may be used for other purposes; ask your health care provider or pharmacist if you have questions. COMMON BRAND NAME(S): Prolia, XGEVA What should I tell my health care provider before I take this medicine? They need to know if you have any of these conditions: -dental disease -eczema -infection or history of infections -kidney disease or on dialysis -low blood calcium or vitamin D -malabsorption syndrome -scheduled to have surgery or tooth extraction -taking medicine that contains denosumab -thyroid or parathyroid disease -an unusual reaction to denosumab, other medicines, foods, dyes, or preservatives -pregnant or trying to get pregnant -breast-feeding How should I use this medicine? This medicine is for injection under the skin. It is given by a health care professional in a hospital or clinic setting. If you are getting Prolia, a special MedGuide will be given to you by the pharmacist with each prescription and refill. Be sure to read this information carefully each time. For Prolia, talk to your pediatrician regarding the use of this medicine in children. Special care may be needed. For Xgeva, talk to your pediatrician regarding the use of this medicine in children. While this drug may be prescribed for children as young as 13 years for selected conditions, precautions do apply. Overdosage: If you think you've taken too much of this medicine contact a poison control center or emergency room at once. Overdosage: If you think you have taken too much of this medicine contact a poison control center or emergency room at once. NOTE: This medicine is only for  you. Do not share this medicine with others. What if I miss a dose? It is important not to miss your dose. Call your doctor or health care professional if you are unable to keep an appointment. What may interact with this medicine? Do not take this medicine with any of the following medications: -other medicines containing denosumab This medicine may also interact with the following medications: -medicines that suppress the immune system -medicines that treat cancer -steroid medicines like prednisone or cortisone This list may not describe all possible interactions. Give your health care provider a list of all the medicines, herbs, non-prescription drugs, or dietary supplements you use. Also tell them if you smoke, drink alcohol, or use illegal drugs. Some items may interact with your medicine. What should I watch for while using this medicine? Visit your doctor or health care professional for regular checks on your progress. Your doctor or health care professional may order blood tests and other tests to see how you are doing. Call your doctor or health care professional if you get a cold or other infection while receiving this medicine. Do not treat yourself. This medicine may decrease your body's ability to fight infection. You should make sure you get enough calcium and vitamin D while you are taking this medicine, unless your doctor tells you not to. Discuss the foods you eat and the vitamins you take with your health care professional. See your dentist regularly. Brush and floss your teeth as directed. Before you have any dental work done, tell your dentist you are receiving this medicine. Do not become pregnant while taking this medicine or for 5 months after stopping   it. Women should inform their doctor if they wish to become pregnant or think they might be pregnant. There is a potential for serious side effects to an unborn child. Talk to your health care professional or pharmacist for more  information. What side effects may I notice from receiving this medicine? Side effects that you should report to your doctor or health care professional as soon as possible: -allergic reactions like skin rash, itching or hives, swelling of the face, lips, or tongue -breathing problems -chest pain -fast, irregular heartbeat -feeling faint or lightheaded, falls -fever, chills, or any other sign of infection -muscle spasms, tightening, or twitches -numbness or tingling -skin blisters or bumps, or is dry, peels, or red -slow healing or unexplained pain in the mouth or jaw -unusual bleeding or bruising Side effects that usually do not require medical attention (Report these to your doctor or health care professional if they continue or are bothersome.): -muscle pain -stomach upset, gas This list may not describe all possible side effects. Call your doctor for medical advice about side effects. You may report side effects to FDA at 1-800-FDA-1088. Where should I keep my medicine? This medicine is only given in a clinic, doctor's office, or other health care setting and will not be stored at home. NOTE: This sheet is a summary. It may not cover all possible information. If you have questions about this medicine, talk to your doctor, pharmacist, or health care provider.  2015, [employer]. (2011-07-13 12:37:47) Goserelin injection What is this medicine? GOSERELIN (GOE se rel in) is similar to a hormone found in the body. It lowers the amount of sex hormones that the body makes. Men will have lower testosterone levels and women will have lower estrogen levels while taking this medicine. In men, this medicine is used to treat prostate cancer; the injection is either given once per month or once every 12 weeks. A once per month injection (only) is used to treat women with endometriosis, dysfunctional uterine bleeding, or advanced breast cancer. This medicine may be used for other purposes;  ask your health care provider or pharmacist if you have questions. COMMON BRAND NAME(S): Zoladex What should I tell my health care provider before I take this medicine? They need to know if you have any of these conditions (some only apply to women): -diabetes -heart disease or previous heart attack -high blood pressure -high cholesterol -kidney disease -osteoporosis or low bone density -problems passing urine -spinal cord injury -stroke -tobacco smoker -an unusual or allergic reaction to goserelin, hormone therapy, other medicines, foods, dyes, or preservatives -pregnant or trying to get pregnant -breast-feeding How should I use this medicine? This medicine is for injection under the skin. It is given by a health care professional in a hospital or clinic setting. Men receive this injection once every 4 weeks or once every 12 weeks. Women will only receive the once every 4 weeks injection. Talk to your pediatrician regarding the use of this medicine in children. Special care may be needed. Overdosage: If you think you have taken too much of this medicine contact a poison control center or emergency room at once. NOTE: This medicine is only for you. Do not share this medicine with others. What if I miss a dose? It is important not to miss your dose. Call your doctor or health care professional if you are unable to keep an appointment. What may interact with this medicine? -female hormones like estrogen -herbal or dietary supplements like black cohosh, chasteberry,  or DHEA -female hormones like testosterone -prasterone This list may not describe all possible interactions. Give your health care provider a list of all the medicines, herbs, non-prescription drugs, or dietary supplements you use. Also tell them if you smoke, drink alcohol, or use illegal drugs. Some items may interact with your medicine. What should I watch for while using this medicine? Visit your doctor or health care  professional for regular checks on your progress. Your symptoms may appear to get worse during the first weeks of this therapy. Tell your doctor or healthcare professional if your symptoms do not start to get better or if they get worse after this time. Your bones may get weaker if you take this medicine for a long time. If you smoke or frequently drink alcohol you may increase your risk of bone loss. A family history of osteoporosis, chronic use of drugs for seizures (convulsions), or corticosteroids can also increase your risk of bone loss. Talk to your doctor about how to keep your bones strong. This medicine should stop regular monthly menstration in women. Tell your doctor if you continue to [facility]. Women should not become pregnant while taking this medicine or for 12 weeks after stopping this medicine. Women should inform their doctor if they wish to become pregnant or think they might be pregnant. There is a potential for serious side effects to an unborn child. Talk to your health care professional or pharmacist for more information. Do not breast-feed an infant while taking this medicine. Men should inform their doctors if they wish to father a child. This medicine may lower sperm counts. Talk to your health care professional or pharmacist for more information. What side effects may I notice from receiving this medicine? Side effects that you should report to your doctor or health care professional as soon as possible: -allergic reactions like skin rash, itching or hives, swelling of the face, lips, or tongue -bone pain -breathing problems -changes in vision -chest pain -feeling faint or lightheaded, falls -fever, chills -pain, swelling, warmth in the leg -pain, tingling, numbness in the hands or feet -signs and symptoms of low blood pressure like dizziness; feeling faint or lightheaded, falls; unusually weak or tired -stomach pain -swelling of the ankles, feet, hands -trouble passing  urine or change in the amount of urine -unusually high or low blood pressure -unusually weak or tired Side effects that usually do not require medical attention (report to your doctor or health care professional if they continue or are bothersome): -change in sex drive or performance -changes in breast size in both males and females -changes in emotions or moods -headache -hot flashes -irritation at site where injected -loss of appetite -skin problems like acne, dry skin -vaginal dryness This list may not describe all possible side effects. Call your doctor for medical advice about side effects. You may report side effects to FDA at 1-800-FDA-1088. Where should I keep my medicine? This drug is given in a hospital or clinic and will not be stored at home. NOTE: This sheet is a summary. It may not cover all possible information. If you have questions about this medicine, talk to your doctor, pharmacist, or health care provider.  2015, [employer]. (2013-03-21 11:10:35)

## 2016-11-13 NOTE — Telephone Encounter (Signed)
No entry 

## 2016-11-24 ENCOUNTER — Ambulatory Visit (HOSPITAL_COMMUNITY)
Admission: RE | Admit: 2016-11-24 | Discharge: 2016-11-24 | Disposition: A | Discharge status: Home / Self Care | Payer: BLUE CROSS/BLUE SHIELD | Source: Ambulatory Visit | Attending: Oncology | Admitting: Oncology

## 2016-11-24 DIAGNOSIS — Z170 Estrogen receptor positive status [ER+]: Secondary | ICD-10-CM | POA: Diagnosis not present

## 2016-11-24 DIAGNOSIS — C50411 Malignant neoplasm of upper-outer quadrant of right female breast: Secondary | ICD-10-CM | POA: Diagnosis present

## 2016-11-24 DIAGNOSIS — C7800 Secondary malignant neoplasm of unspecified lung: Secondary | ICD-10-CM | POA: Insufficient documentation

## 2016-11-24 DIAGNOSIS — C50919 Malignant neoplasm of unspecified site of unspecified female breast: Secondary | ICD-10-CM

## 2016-11-24 LAB — GLUCOSE, CAPILLARY: GLUCOSE-CAPILLARY: 99 mg/dL (ref 65–99)

## 2016-11-24 MED ORDER — FLUDEOXYGLUCOSE F - 18 (FDG) INJECTION
11.6000 | Freq: Once | INTRAVENOUS | Status: AC | PRN
  Administered 2016-11-24: 11.6 via INTRAVENOUS

## 2016-11-25 ENCOUNTER — Telehealth: Payer: Self-pay

## 2016-11-25 NOTE — Telephone Encounter (Signed)
lvm with results of PET

## 2016-12-22 ENCOUNTER — Other Ambulatory Visit: Payer: Self-pay

## 2016-12-22 ENCOUNTER — Ambulatory Visit (AMBULATORY_SURGERY_CENTER): Payer: Self-pay | Admitting: *Deleted

## 2016-12-22 VITALS — Ht 62.0 in | Wt 214.0 lb

## 2016-12-22 DIAGNOSIS — Z86010 Personal history of colonic polyps: Secondary | ICD-10-CM

## 2016-12-22 NOTE — Progress Notes (Signed)
Patient denies any allergies to eggs or soy. Patient denies any problems with anesthesia/sedation. Patient denies any oxygen use at home. Patient denies taking any diet/weight loss medications or blood thinners. EMMI education assisgned to patient on colonoscopy, this was explained and instructions given to patient. 

## 2016-12-30 ENCOUNTER — Encounter: Payer: Self-pay | Admitting: Internal Medicine

## 2016-12-30 ENCOUNTER — Other Ambulatory Visit: Payer: Self-pay | Admitting: Oncology

## 2016-12-31 ENCOUNTER — Other Ambulatory Visit: Payer: Self-pay | Admitting: Oncology

## 2016-12-31 DIAGNOSIS — C50411 Malignant neoplasm of upper-outer quadrant of right female breast: Secondary | ICD-10-CM

## 2017-01-04 ENCOUNTER — Other Ambulatory Visit: Payer: Self-pay | Admitting: Oncology

## 2017-01-05 ENCOUNTER — Encounter: Payer: BLUE CROSS/BLUE SHIELD | Admitting: Internal Medicine

## 2017-01-06 ENCOUNTER — Other Ambulatory Visit: Payer: Self-pay | Admitting: *Deleted

## 2017-01-06 DIAGNOSIS — Z170 Estrogen receptor positive status [ER+]: Principal | ICD-10-CM

## 2017-01-06 DIAGNOSIS — C50411 Malignant neoplasm of upper-outer quadrant of right female breast: Secondary | ICD-10-CM

## 2017-01-06 MED ORDER — LETROZOLE 2.5 MG PO TABS: 2.5000 mg | ORAL_TABLET | Freq: Every day | ORAL | 1 refills | Status: DC
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
## 2017-01-11 ENCOUNTER — Other Ambulatory Visit (HOSPITAL_BASED_OUTPATIENT_CLINIC_OR_DEPARTMENT_OTHER): Payer: BLUE CROSS/BLUE SHIELD

## 2017-01-11 ENCOUNTER — Other Ambulatory Visit: Payer: Self-pay

## 2017-01-11 ENCOUNTER — Telehealth: Payer: Self-pay

## 2017-01-11 ENCOUNTER — Ambulatory Visit (HOSPITAL_BASED_OUTPATIENT_CLINIC_OR_DEPARTMENT_OTHER): Payer: BLUE CROSS/BLUE SHIELD

## 2017-01-11 ENCOUNTER — Ambulatory Visit (HOSPITAL_BASED_OUTPATIENT_CLINIC_OR_DEPARTMENT_OTHER): Payer: BLUE CROSS/BLUE SHIELD | Admitting: Oncology

## 2017-01-11 VITALS — BP 126/67 | HR 74 | Temp 98.0°F | Resp 18 | Ht 62.0 in | Wt 215.9 lb

## 2017-01-11 DIAGNOSIS — C7951 Secondary malignant neoplasm of bone: Secondary | ICD-10-CM

## 2017-01-11 DIAGNOSIS — C7800 Secondary malignant neoplasm of unspecified lung: Secondary | ICD-10-CM | POA: Diagnosis not present

## 2017-01-11 DIAGNOSIS — R197 Diarrhea, unspecified: Secondary | ICD-10-CM

## 2017-01-11 DIAGNOSIS — Z170 Estrogen receptor positive status [ER+]: Secondary | ICD-10-CM

## 2017-01-11 DIAGNOSIS — C50411 Malignant neoplasm of upper-outer quadrant of right female breast: Secondary | ICD-10-CM

## 2017-01-11 DIAGNOSIS — C50919 Malignant neoplasm of unspecified site of unspecified female breast: Secondary | ICD-10-CM

## 2017-01-11 LAB — COMPREHENSIVE METABOLIC PANEL
ALT: 29 U/L (ref 0–55)
ANION GAP: 9 meq/L (ref 3–11)
AST: 20 U/L (ref 5–34)
Albumin: 4 g/dL (ref 3.5–5.0)
Alkaline Phosphatase: 56 U/L (ref 40–150)
BILIRUBIN TOTAL: 0.35 mg/dL (ref 0.20–1.20)
BUN: 10.7 mg/dL (ref 7.0–26.0)
CALCIUM: 9.2 mg/dL (ref 8.4–10.4)
CO2: 22 mEq/L (ref 22–29)
CREATININE: 1 mg/dL (ref 0.6–1.1)
Chloride: 106 mEq/L (ref 98–109)
Glucose: 96 mg/dl (ref 70–140)
Potassium: 4.4 mEq/L (ref 3.5–5.1)
Sodium: 137 mEq/L (ref 136–145)
TOTAL PROTEIN: 7 g/dL (ref 6.4–8.3)

## 2017-01-11 LAB — CBC WITH DIFFERENTIAL/PLATELET
BASO%: 1.2 % (ref 0.0–2.0)
Basophils Absolute: 0 10*3/uL (ref 0.0–0.1)
EOS%: 0.4 % (ref 0.0–7.0)
Eosinophils Absolute: 0 10*3/uL (ref 0.0–0.5)
HCT: 38 % (ref 34.8–46.6)
HEMOGLOBIN: 13.1 g/dL (ref 11.6–15.9)
LYMPH%: 29.6 % (ref 14.0–49.7)
MCH: 35.4 pg — ABNORMAL HIGH (ref 25.1–34.0)
MCHC: 34.6 g/dL (ref 31.5–36.0)
MCV: 102.4 fL — ABNORMAL HIGH (ref 79.5–101.0)
MONO#: 0.4 10*3/uL (ref 0.1–0.9)
MONO%: 10.6 % (ref 0.0–14.0)
NEUT%: 58.2 % (ref 38.4–76.8)
NEUTROS ABS: 2 10*3/uL (ref 1.5–6.5)
Platelets: 240 10*3/uL (ref 145–400)
RBC: 3.71 10*6/uL (ref 3.70–5.45)
RDW: 15.1 % — AB (ref 11.2–14.5)
WBC: 3.4 10*3/uL — AB (ref 3.9–10.3)
lymph#: 1 10*3/uL (ref 0.9–3.3)

## 2017-01-11 MED ORDER — DENOSUMAB 120 MG/1.7ML ~~LOC~~ SOLN
120.0000 mg | Freq: Once | SUBCUTANEOUS | Status: AC
  Administered 2017-01-11: 120 mg via SUBCUTANEOUS

## 2017-01-11 NOTE — Telephone Encounter (Signed)
-----   Message from Gatha Mayer, MD sent at 01/11/2017  2:50 PM EST ----- Thanks Gus - we will call her and review with her  Barbera Setters - please see how bad her sxs are - if she is coughing a lot and she is ok with reschedule can wait til she feels better  Glendell Docker ----- Message ----- From: Chauncey Cruel, MD Sent: 01/11/2017   2:32 PM To: Gatha Mayer, MD  [facility] Glendell Docker! Ivi has what seems to be a viral URI--she has good counts and good sats. I am just mentioning this because you have her scheduled for colonoscopy tomorrow and she was concerned  Thanks!  Gus

## 2017-01-11 NOTE — Telephone Encounter (Signed)
Patient feels ok "just have a cold".  She does not feel she has much of a cough.  She denies fever.  She would like to proceed with colonoscopy as scheduled tomorrow.

## 2017-01-11 NOTE — Patient Instructions (Signed)
Denosumab injection What is this medicine? DENOSUMAB (den oh sue mab) slows bone breakdown. Prolia is used to treat osteoporosis in women after menopause and in men. Xgeva is used to prevent bone fractures and other bone problems caused by cancer bone metastases. Xgeva is also used to treat giant cell tumor of the bone. This medicine may be used for other purposes; ask your health care provider or pharmacist if you have questions. COMMON BRAND NAME(S): Prolia, XGEVA What should I tell my health care provider before I take this medicine? They need to know if you have any of these conditions: -dental disease -eczema -infection or history of infections -kidney disease or on dialysis -low blood calcium or vitamin D -malabsorption syndrome -scheduled to have surgery or tooth extraction -taking medicine that contains denosumab -thyroid or parathyroid disease -an unusual reaction to denosumab, other medicines, foods, dyes, or preservatives -pregnant or trying to get pregnant -breast-feeding How should I use this medicine? This medicine is for injection under the skin. It is given by a health care professional in a hospital or clinic setting. If you are getting Prolia, a special MedGuide will be given to you by the pharmacist with each prescription and refill. Be sure to read this information carefully each time. For Prolia, talk to your pediatrician regarding the use of this medicine in children. Special care may be needed. For Xgeva, talk to your pediatrician regarding the use of this medicine in children. While this drug may be prescribed for children as young as 13 years for selected conditions, precautions do apply. Overdosage: If you think you've taken too much of this medicine contact a poison control center or emergency room at once. Overdosage: If you think you have taken too much of this medicine contact a poison control center or emergency room at once. NOTE: This medicine is only for  you. Do not share this medicine with others. What if I miss a dose? It is important not to miss your dose. Call your doctor or health care professional if you are unable to keep an appointment. What may interact with this medicine? Do not take this medicine with any of the following medications: -other medicines containing denosumab This medicine may also interact with the following medications: -medicines that suppress the immune system -medicines that treat cancer -steroid medicines like prednisone or cortisone This list may not describe all possible interactions. Give your health care provider a list of all the medicines, herbs, non-prescription drugs, or dietary supplements you use. Also tell them if you smoke, drink alcohol, or use illegal drugs. Some items may interact with your medicine. What should I watch for while using this medicine? Visit your doctor or health care professional for regular checks on your progress. Your doctor or health care professional may order blood tests and other tests to see how you are doing. Call your doctor or health care professional if you get a cold or other infection while receiving this medicine. Do not treat yourself. This medicine may decrease your body's ability to fight infection. You should make sure you get enough calcium and vitamin D while you are taking this medicine, unless your doctor tells you not to. Discuss the foods you eat and the vitamins you take with your health care professional. See your dentist regularly. Brush and floss your teeth as directed. Before you have any dental work done, tell your dentist you are receiving this medicine. Do not become pregnant while taking this medicine or for 5 months after stopping   it. Women should inform their doctor if they wish to become pregnant or think they might be pregnant. There is a potential for serious side effects to an unborn child. Talk to your health care professional or pharmacist for more  information. What side effects may I notice from receiving this medicine? Side effects that you should report to your doctor or health care professional as soon as possible: -allergic reactions like skin rash, itching or hives, swelling of the face, lips, or tongue -breathing problems -chest pain -fast, irregular heartbeat -feeling faint or lightheaded, falls -fever, chills, or any other sign of infection -muscle spasms, tightening, or twitches -numbness or tingling -skin blisters or bumps, or is dry, peels, or red -slow healing or unexplained pain in the mouth or jaw -unusual bleeding or bruising Side effects that usually do not require medical attention (Report these to your doctor or health care professional if they continue or are bothersome.): -muscle pain -stomach upset, gas This list may not describe all possible side effects. Call your doctor for medical advice about side effects. You may report side effects to FDA at 1-800-FDA-1088. Where should I keep my medicine? This medicine is only given in a clinic, doctor's office, or other health care setting and will not be stored at home. NOTE: This sheet is a summary. It may not cover all possible information. If you have questions about this medicine, talk to your doctor, pharmacist, or health care provider.  2015, Elsevier/Gold Standard. (2011-07-13 12:37:47) Goserelin injection What is this medicine? GOSERELIN (GOE se rel in) is similar to a hormone found in the body. It lowers the amount of sex hormones that the body makes. Men will have lower testosterone levels and women will have lower estrogen levels while taking this medicine. In men, this medicine is used to treat prostate cancer; the injection is either given once per month or once every 12 weeks. A once per month injection (only) is used to treat women with endometriosis, dysfunctional uterine bleeding, or advanced breast cancer. This medicine may be used for other purposes;  ask your health care provider or pharmacist if you have questions. COMMON BRAND NAME(S): Zoladex What should I tell my health care provider before I take this medicine? They need to know if you have any of these conditions (some only apply to women): -diabetes -heart disease or previous heart attack -high blood pressure -high cholesterol -kidney disease -osteoporosis or low bone density -problems passing urine -spinal cord injury -stroke -tobacco smoker -an unusual or allergic reaction to goserelin, hormone therapy, other medicines, foods, dyes, or preservatives -pregnant or trying to get pregnant -breast-feeding How should I use this medicine? This medicine is for injection under the skin. It is given by a health care professional in a hospital or clinic setting. Men receive this injection once every 4 weeks or once every 12 weeks. Women will only receive the once every 4 weeks injection. Talk to your pediatrician regarding the use of this medicine in children. Special care may be needed. Overdosage: If you think you have taken too much of this medicine contact a poison control center or emergency room at once. NOTE: This medicine is only for you. Do not share this medicine with others. What if I miss a dose? It is important not to miss your dose. Call your doctor or health care professional if you are unable to keep an appointment. What may interact with this medicine? -female hormones like estrogen -herbal or dietary supplements like black cohosh, chasteberry,  or DHEA -female hormones like testosterone -prasterone This list may not describe all possible interactions. Give your health care provider a list of all the medicines, herbs, non-prescription drugs, or dietary supplements you use. Also tell them if you smoke, drink alcohol, or use illegal drugs. Some items may interact with your medicine. What should I watch for while using this medicine? Visit your doctor or health care  professional for regular checks on your progress. Your symptoms may appear to get worse during the first weeks of this therapy. Tell your doctor or healthcare professional if your symptoms do not start to get better or if they get worse after this time. Your bones may get weaker if you take this medicine for a long time. If you smoke or frequently drink alcohol you may increase your risk of bone loss. A family history of osteoporosis, chronic use of drugs for seizures (convulsions), or corticosteroids can also increase your risk of bone loss. Talk to your doctor about how to keep your bones strong. This medicine should stop regular monthly menstration in women. Tell your doctor if you continue to [facility]. Women should not become pregnant while taking this medicine or for 12 weeks after stopping this medicine. Women should inform their doctor if they wish to become pregnant or think they might be pregnant. There is a potential for serious side effects to an unborn child. Talk to your health care professional or pharmacist for more information. Do not breast-feed an infant while taking this medicine. Men should inform their doctors if they wish to father a child. This medicine may lower sperm counts. Talk to your health care professional or pharmacist for more information. What side effects may I notice from receiving this medicine? Side effects that you should report to your doctor or health care professional as soon as possible: -allergic reactions like skin rash, itching or hives, swelling of the face, lips, or tongue -bone pain -breathing problems -changes in vision -chest pain -feeling faint or lightheaded, falls -fever, chills -pain, swelling, warmth in the leg -pain, tingling, numbness in the hands or feet -signs and symptoms of low blood pressure like dizziness; feeling faint or lightheaded, falls; unusually weak or tired -stomach pain -swelling of the ankles, feet, hands -trouble passing  urine or change in the amount of urine -unusually high or low blood pressure -unusually weak or tired Side effects that usually do not require medical attention (report to your doctor or health care professional if they continue or are bothersome): -change in sex drive or performance -changes in breast size in both males and females -changes in emotions or moods -headache -hot flashes -irritation at site where injected -loss of appetite -skin problems like acne, dry skin -vaginal dryness This list may not describe all possible side effects. Call your doctor for medical advice about side effects. You may report side effects to FDA at 1-800-FDA-1088. Where should I keep my medicine? This drug is given in a hospital or clinic and will not be stored at home. NOTE: This sheet is a summary. It may not cover all possible information. If you have questions about this medicine, talk to your doctor, pharmacist, or health care provider.  2015, Elsevier/Gold Standard. (2013-03-21 11:10:35)

## 2017-01-11 NOTE — Telephone Encounter (Signed)
Appts made and avs printed for pt per 12/17 los

## 2017-01-12 ENCOUNTER — Ambulatory Visit (AMBULATORY_SURGERY_CENTER): Payer: BLUE CROSS/BLUE SHIELD | Admitting: Internal Medicine

## 2017-01-12 ENCOUNTER — Encounter: Payer: Self-pay | Admitting: Internal Medicine

## 2017-01-12 ENCOUNTER — Other Ambulatory Visit: Payer: Self-pay

## 2017-01-12 VITALS — BP 137/68 | HR 76 | Temp 96.8°F | Resp 20 | Ht 62.0 in | Wt 214.0 lb

## 2017-01-12 DIAGNOSIS — D124 Benign neoplasm of descending colon: Secondary | ICD-10-CM

## 2017-01-12 DIAGNOSIS — Z86010 Personal history of colonic polyps: Secondary | ICD-10-CM | POA: Diagnosis not present

## 2017-01-12 DIAGNOSIS — D122 Benign neoplasm of ascending colon: Secondary | ICD-10-CM | POA: Diagnosis not present

## 2017-01-12 DIAGNOSIS — D123 Benign neoplasm of transverse colon: Secondary | ICD-10-CM | POA: Diagnosis not present

## 2017-01-12 MED ORDER — SODIUM CHLORIDE 0.9 % IV SOLN: 500.0000 mL | Freq: Once | INTRAVENOUS | Status: DC

## 2017-01-12 NOTE — Progress Notes (Signed)
Pt's states no medical or surgical changes since previsit or office visit. 

## 2017-01-12 NOTE — Patient Instructions (Addendum)
   I found and removed 3 small polyps that look benign. I will let you know pathology results and when to have another routine colonoscopy by mail and/or My Chart.  I appreciate the opportunity to care for you. Gatha Mayer, MD, [facility]  **Handout given on polyps**  YOU HAD AN ENDOSCOPIC PROCEDURE TODAY: Refer to the procedure report and other information in the discharge instructions given to you for any specific questions about what was found during the examination. If this information does not answer your questions, please call [place] office at [phone] to clarify.   YOU SHOULD EXPECT: Some feelings of bloating in the abdomen. Passage of more gas than usual. Walking can help get rid of the air that was put into your GI tract during the procedure and reduce the bloating. If you had a lower endoscopy (such as a colonoscopy or flexible sigmoidoscopy) you may notice spotting of blood in your stool or on the toilet paper. Some abdominal soreness may be present for a day or two, also.  DIET: Your first meal following the procedure should be a light meal and then it is ok to progress to your normal diet. A half-sandwich or bowl of soup is an example of a good first meal. Heavy or fried foods are harder to digest and may make you feel nauseous or bloated. Drink plenty of fluids but you should avoid alcoholic beverages for 24 hours. If you had a esophageal dilation, please see attached instructions for diet.    ACTIVITY: Your care partner should take you home directly after the procedure. You should plan to take it easy, moving slowly for the rest of the day. You can resume normal activity the day after the procedure however YOU SHOULD NOT DRIVE, use power tools, machinery or perform tasks that involve climbing or major physical exertion for 24 hours (because of the sedation medicines used during the test).   SYMPTOMS TO REPORT IMMEDIATELY: A gastroenterologist can be reached at any hour.  Please call [phone]  for any of the following symptoms:  Following lower endoscopy (colonoscopy, flexible sigmoidoscopy) Excessive amounts of blood in the stool  Significant tenderness, worsening of abdominal pains  Swelling of the abdomen that is new, acute  Fever of 100 or higher    FOLLOW UP:  If any biopsies were taken you will be contacted by phone or by letter within the next 1-3 weeks. Call [phone]  if you have not heard about the biopsies in 3 weeks.  Please also call with any specific questions about appointments or follow up tests.

## 2017-01-12 NOTE — Progress Notes (Signed)
A/ox3 pleased with MAC, report to [facility]

## 2017-01-12 NOTE — Progress Notes (Signed)
Called to room to assist during endoscopic procedure.  Patient ID and intended procedure confirmed with present staff. Received instructions for my participation in the procedure from the performing physician.  

## 2017-01-12 NOTE — Op Note (Signed)
[facility] Patient Name: Cheryl Moore Procedure Date: 01/12/2017 9:46 AM MRN: [id number] Endoscopist: Gatha Mayer , MD Age: 56 Referring MD:  Date of Birth: Jun 21, 1960 Gender: Female Account #: [id number] Procedure:                Colonoscopy Indications:              Surveillance: Personal history of adenomatous                            polyps on last colonoscopy 3 years ago Medicines:                Propofol per Anesthesia, Monitored Anesthesia Care Procedure:                Pre-Anesthesia Assessment:                           - Prior to the procedure, a History and Physical                            was performed, and patient medications and                            allergies were reviewed. The patient's tolerance of                            previous anesthesia was also reviewed. The risks                            and benefits of the procedure and the sedation                            options and risks were discussed with the patient.                            All questions were answered, and informed consent                            was obtained. Prior Anticoagulants: The patient has                            taken no previous anticoagulant or antiplatelet                            agents. ASA Grade Assessment: II - A patient with                            mild systemic disease. After reviewing the risks                            and benefits, the patient was deemed in                            satisfactory condition to undergo the procedure.  After obtaining informed consent, the colonoscope                            was passed under direct vision. Throughout the                            procedure, the patient's blood pressure, pulse, and                            oxygen saturations were monitored continuously. The                            Model PCF-H190DL [id number]) scope was introduced   through the anus and advanced to the the cecum,                            identified by appendiceal orifice and ileocecal                            valve. The colonoscopy was performed without                            difficulty. The patient tolerated the procedure                            well. The quality of the bowel preparation was                            good. The ileocecal valve, appendiceal orifice, and                            rectum were photographed. The bowel preparation                            used was Miralax. Scope In: 10:05:10 AM Scope Out: 10:24:16 AM Scope Withdrawal Time: 0 hours 14 minutes 50 seconds  Total Procedure Duration: 0 hours 19 minutes 6 seconds  Findings:                 The perianal and digital rectal examinations were                            normal.                           Three sessile polyps were found in the descending                            colon, transverse colon and ascending colon. The                            polyps were 3 to 8 mm in size. These polyps were                            removed with a cold snare.  Resection and retrieval                            were complete. Verification of patient                            identification for the specimen was done. Estimated                            blood loss was minimal.                           The exam was otherwise without abnormality on                            direct and retroflexion views. Complications:            No immediate complications. Estimated Blood Loss:     Estimated blood loss was minimal. Impression:               - Three 3 to 8 mm polyps in the descending colon,                            in the transverse colon and in the ascending colon,                            removed with a cold snare. Resected and retrieved.                           - The examination was otherwise normal on direct                            and retroflexion views.                            - Personal history of colonic polyps. Recommendation:           - Patient has a contact number available for                            emergencies. The signs and symptoms of potential                            delayed complications were discussed with the                            patient. Return to normal activities tomorrow.                            Written discharge instructions were provided to the                            patient.                           - Resume previous diet.                           -  Continue present medications.                           - Repeat colonoscopy is recommended for                            surveillance. The colonoscopy date will be                            determined after pathology results from today's                            exam become available for review. Gatha Mayer, MD 01/12/2017 10:30:50 AM This report has been signed electronically.

## 2017-01-13 ENCOUNTER — Telehealth: Payer: Self-pay

## 2017-01-13 NOTE — Telephone Encounter (Signed)
  Follow up Call-  Call back number 01/12/2017  Post procedure Call Back phone  # [phone]  Permission to leave phone message Yes  Some recent data might be hidden     Patient questions:  Do you have a fever, pain , or abdominal swelling? No. Pain Score  0 *  Have you tolerated food without any problems? Yes.    Have you been able to return to your normal activities? Yes.    Do you have any questions about your discharge instructions: Diet   No. Medications  No. Follow up visit  No.  Do you have questions or concerns about your Care? No.  Actions: * If pain score is 4 or above: No action needed, pain <4.

## 2017-01-21 ENCOUNTER — Encounter: Payer: Self-pay | Admitting: Internal Medicine

## 2017-01-21 NOTE — Progress Notes (Signed)
2 adenomas and 1 ssp Recall 2021 My Chart letter

## 2017-02-02 ENCOUNTER — Other Ambulatory Visit: Payer: Self-pay | Admitting: Oncology

## 2017-02-02 DIAGNOSIS — C50411 Malignant neoplasm of upper-outer quadrant of right female breast: Secondary | ICD-10-CM

## 2017-02-09 ENCOUNTER — Inpatient Hospital Stay: Payer: BLUE CROSS/BLUE SHIELD | Attending: Oncology

## 2017-02-09 DIAGNOSIS — Z170 Estrogen receptor positive status [ER+]: Secondary | ICD-10-CM

## 2017-02-09 DIAGNOSIS — C7800 Secondary malignant neoplasm of unspecified lung: Secondary | ICD-10-CM

## 2017-02-09 DIAGNOSIS — C50919 Malignant neoplasm of unspecified site of unspecified female breast: Secondary | ICD-10-CM

## 2017-02-09 DIAGNOSIS — C50411 Malignant neoplasm of upper-outer quadrant of right female breast: Secondary | ICD-10-CM | POA: Diagnosis present

## 2017-02-09 LAB — COMPREHENSIVE METABOLIC PANEL
ALBUMIN: 3.7 g/dL (ref 3.5–5.0)
ALT: 30 U/L (ref 0–55)
ANION GAP: 9 (ref 3–11)
AST: 21 U/L (ref 5–34)
Alkaline Phosphatase: 60 U/L (ref 40–150)
BUN: 15 mg/dL (ref 7–26)
CO2: 22 mmol/L (ref 22–29)
Calcium: 8.6 mg/dL (ref 8.4–10.4)
Chloride: 109 mmol/L (ref 98–109)
Creatinine, Ser: 0.96 mg/dL (ref 0.60–1.10)
GFR calc Af Amer: >60 mL/min (ref >60)
GFR calc non Af Amer: >60 mL/min (ref >60)
GLUCOSE: 158 mg/dL — AB (ref 70–140)
POTASSIUM: 4.1 mmol/L (ref 3.3–4.7)
SODIUM: 140 mmol/L (ref 136–145)
TOTAL PROTEIN: 6.8 g/dL (ref 6.4–8.3)
Total Bilirubin: 0.3 mg/dL (ref 0.2–1.2)

## 2017-02-09 LAB — CBC WITH DIFFERENTIAL/PLATELET
BASOS PCT: 1 %
Basophils Absolute: 0 10*3/uL (ref 0.0–0.1)
EOS ABS: 0.1 10*3/uL (ref 0.0–0.5)
Eosinophils Relative: 2 %
HCT: 37.7 % (ref 34.8–46.6)
Hemoglobin: 13 g/dL (ref 11.6–15.9)
Lymphocytes Relative: 37 %
Lymphs Abs: 1.4 10*3/uL (ref 0.9–3.3)
MCH: 35 pg — ABNORMAL HIGH (ref 25.1–34.0)
MCHC: 34.3 g/dL (ref 31.5–36.0)
MCV: 101.9 fL — ABNORMAL HIGH (ref 79.5–101.0)
MONO ABS: 0.4 10*3/uL (ref 0.1–0.9)
MONOS PCT: 10 %
Neutro Abs: 1.9 10*3/uL (ref 1.5–6.5)
Neutrophils Relative %: 50 %
Platelets: 214 10*3/uL (ref 145–400)
RBC: 3.7 MIL/uL (ref 3.70–5.45)
RDW: 14.2 % (ref 11.2–16.1)
WBC: 3.9 10*3/uL (ref 3.9–10.3)

## 2017-03-05 ENCOUNTER — Other Ambulatory Visit: Payer: Self-pay | Admitting: Oncology

## 2017-03-05 DIAGNOSIS — C50411 Malignant neoplasm of upper-outer quadrant of right female breast: Secondary | ICD-10-CM

## 2017-03-07 ENCOUNTER — Other Ambulatory Visit: Payer: Self-pay | Admitting: Oncology

## 2017-03-07 DIAGNOSIS — C50411 Malignant neoplasm of upper-outer quadrant of right female breast: Secondary | ICD-10-CM

## 2017-03-09 ENCOUNTER — Inpatient Hospital Stay: Payer: BLUE CROSS/BLUE SHIELD | Attending: Oncology

## 2017-03-09 DIAGNOSIS — C50919 Malignant neoplasm of unspecified site of unspecified female breast: Secondary | ICD-10-CM

## 2017-03-09 DIAGNOSIS — Z7951 Long term (current) use of inhaled steroids: Secondary | ICD-10-CM | POA: Diagnosis not present

## 2017-03-09 DIAGNOSIS — C7800 Secondary malignant neoplasm of unspecified lung: Secondary | ICD-10-CM

## 2017-03-09 DIAGNOSIS — C50411 Malignant neoplasm of upper-outer quadrant of right female breast: Secondary | ICD-10-CM | POA: Diagnosis not present

## 2017-03-09 DIAGNOSIS — Z170 Estrogen receptor positive status [ER+]: Secondary | ICD-10-CM

## 2017-03-09 LAB — CBC WITH DIFFERENTIAL/PLATELET
Basophils Absolute: 0 10*3/uL (ref 0.0–0.1)
Basophils Relative: 0 %
Eosinophils Absolute: 0 10*3/uL (ref 0.0–0.5)
Eosinophils Relative: 1 %
HEMATOCRIT: 37.4 % (ref 34.8–46.6)
Hemoglobin: 12.8 g/dL (ref 11.6–15.9)
LYMPHS PCT: 51 %
Lymphs Abs: 1.6 10*3/uL (ref 0.9–3.3)
MCH: 35.3 pg — ABNORMAL HIGH (ref 25.1–34.0)
MCHC: 34.2 g/dL (ref 31.5–36.0)
MCV: 103 fL — AB (ref 79.5–101.0)
MONO ABS: 0.1 10*3/uL (ref 0.1–0.9)
MONOS PCT: 4 %
NEUTROS ABS: 1.4 10*3/uL — AB (ref 1.5–6.5)
Neutrophils Relative %: 44 %
Platelets: 202 10*3/uL (ref 145–400)
RBC: 3.63 MIL/uL — ABNORMAL LOW (ref 3.70–5.45)
RDW: 14.2 % (ref 11.2–14.5)
WBC: 3.2 10*3/uL — ABNORMAL LOW (ref 3.9–10.3)

## 2017-03-09 LAB — COMPREHENSIVE METABOLIC PANEL WITH GFR
ALT: 18 U/L (ref 0–55)
AST: 13 U/L (ref 5–34)
Albumin: 3.7 g/dL (ref 3.5–5.0)
Alkaline Phosphatase: 48 U/L (ref 40–150)
Anion gap: 8 (ref 3–11)
BUN: 15 mg/dL (ref 7–26)
CO2: 25 mmol/L (ref 22–29)
Calcium: 9.5 mg/dL (ref 8.4–10.4)
Chloride: 107 mmol/L (ref 98–109)
Creatinine, Ser: 1.05 mg/dL (ref 0.60–1.10)
GFR calc Af Amer: >60 mL/min
GFR calc non Af Amer: 58 mL/min — ABNORMAL LOW
Glucose, Bld: 100 mg/dL (ref 70–140)
Potassium: 4.2 mmol/L (ref 3.5–5.1)
Sodium: 140 mmol/L (ref 136–145)
Total Bilirubin: 0.3 mg/dL (ref 0.2–1.2)
Total Protein: 6.8 g/dL (ref 6.4–8.3)

## 2017-03-18 ENCOUNTER — Other Ambulatory Visit: Payer: Self-pay | Admitting: *Deleted

## 2017-03-18 MED ORDER — ONDANSETRON 4 MG PO TBDP: 4.0000 mg | ORAL_TABLET | Freq: Three times a day (TID) | ORAL | 0 refills | Status: DC | PRN

## 2017-03-30 ENCOUNTER — Other Ambulatory Visit: Payer: Self-pay | Admitting: Oncology

## 2017-04-06 ENCOUNTER — Inpatient Hospital Stay: Payer: BLUE CROSS/BLUE SHIELD | Attending: Oncology

## 2017-04-06 ENCOUNTER — Inpatient Hospital Stay (HOSPITAL_BASED_OUTPATIENT_CLINIC_OR_DEPARTMENT_OTHER): Payer: BLUE CROSS/BLUE SHIELD | Admitting: Oncology

## 2017-04-06 ENCOUNTER — Telehealth: Payer: Self-pay | Admitting: Oncology

## 2017-04-06 VITALS — BP 98/57 | HR 62 | Temp 98.5°F | Resp 18 | Ht 62.0 in | Wt 213.6 lb

## 2017-04-06 DIAGNOSIS — Z9011 Acquired absence of right breast and nipple: Secondary | ICD-10-CM | POA: Diagnosis not present

## 2017-04-06 DIAGNOSIS — R109 Unspecified abdominal pain: Secondary | ICD-10-CM | POA: Diagnosis not present

## 2017-04-06 DIAGNOSIS — C773 Secondary and unspecified malignant neoplasm of axilla and upper limb lymph nodes: Secondary | ICD-10-CM | POA: Insufficient documentation

## 2017-04-06 DIAGNOSIS — R5383 Other fatigue: Secondary | ICD-10-CM | POA: Insufficient documentation

## 2017-04-06 DIAGNOSIS — Z170 Estrogen receptor positive status [ER+]: Principal | ICD-10-CM

## 2017-04-06 DIAGNOSIS — Z87891 Personal history of nicotine dependence: Secondary | ICD-10-CM | POA: Diagnosis not present

## 2017-04-06 DIAGNOSIS — Z79811 Long term (current) use of aromatase inhibitors: Secondary | ICD-10-CM | POA: Diagnosis not present

## 2017-04-06 DIAGNOSIS — Z79899 Other long term (current) drug therapy: Secondary | ICD-10-CM | POA: Insufficient documentation

## 2017-04-06 DIAGNOSIS — Z923 Personal history of irradiation: Secondary | ICD-10-CM | POA: Insufficient documentation

## 2017-04-06 DIAGNOSIS — R918 Other nonspecific abnormal finding of lung field: Secondary | ICD-10-CM | POA: Diagnosis not present

## 2017-04-06 DIAGNOSIS — C50919 Malignant neoplasm of unspecified site of unspecified female breast: Secondary | ICD-10-CM

## 2017-04-06 DIAGNOSIS — M791 Myalgia, unspecified site: Secondary | ICD-10-CM | POA: Insufficient documentation

## 2017-04-06 DIAGNOSIS — C50411 Malignant neoplasm of upper-outer quadrant of right female breast: Secondary | ICD-10-CM

## 2017-04-06 DIAGNOSIS — C7800 Secondary malignant neoplasm of unspecified lung: Secondary | ICD-10-CM | POA: Diagnosis not present

## 2017-04-06 LAB — COMPREHENSIVE METABOLIC PANEL
ALBUMIN: 3.7 g/dL (ref 3.5–5.0)
ALK PHOS: 58 U/L (ref 40–150)
ALT: 31 U/L (ref 0–55)
AST: 18 U/L (ref 5–34)
Anion gap: 9 (ref 3–11)
BUN: 16 mg/dL (ref 7–26)
CHLORIDE: 107 mmol/L (ref 98–109)
CO2: 23 mmol/L (ref 22–29)
Calcium: 9.4 mg/dL (ref 8.4–10.4)
Creatinine, Ser: 1.02 mg/dL (ref 0.60–1.10)
GFR calc non Af Amer: 60 mL/min — ABNORMAL LOW (ref >60)
GLUCOSE: 94 mg/dL (ref 70–140)
Potassium: 4.3 mmol/L (ref 3.5–5.1)
SODIUM: 139 mmol/L (ref 136–145)
Total Bilirubin: 0.2 mg/dL (ref 0.2–1.2)
Total Protein: 6.7 g/dL (ref 6.4–8.3)

## 2017-04-06 LAB — CBC WITH DIFFERENTIAL/PLATELET
Basophils Absolute: 0.1 10*3/uL (ref 0.0–0.1)
Basophils Relative: 1 %
EOS ABS: 0.1 10*3/uL (ref 0.0–0.5)
Eosinophils Relative: 2 %
HEMATOCRIT: 38.5 % (ref 34.8–46.6)
HEMOGLOBIN: 13.3 g/dL (ref 11.6–15.9)
LYMPHS ABS: 2 10*3/uL (ref 0.9–3.3)
Lymphocytes Relative: 33 %
MCH: 35.2 pg — AB (ref 25.1–34.0)
MCHC: 34.4 g/dL (ref 31.5–36.0)
MCV: 102.1 fL — ABNORMAL HIGH (ref 79.5–101.0)
MONO ABS: 0.5 10*3/uL (ref 0.1–0.9)
MONOS PCT: 8 %
NEUTROS PCT: 56 %
Neutro Abs: 3.5 10*3/uL (ref 1.5–6.5)
Platelets: 243 10*3/uL (ref 145–400)
RBC: 3.77 MIL/uL (ref 3.70–5.45)
RDW: 14.2 % (ref 11.2–14.5)
WBC: 6.2 10*3/uL (ref 3.9–10.3)

## 2017-04-06 NOTE — Telephone Encounter (Signed)
Patient declined avs and calendar of upcoming April appointments. Patient scheduled per 3/12 los.
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
## 2017-04-20 ENCOUNTER — Inpatient Hospital Stay: Payer: BLUE CROSS/BLUE SHIELD

## 2017-04-20 ENCOUNTER — Other Ambulatory Visit: Payer: Self-pay | Admitting: Oncology

## 2017-04-20 ENCOUNTER — Telehealth: Payer: Self-pay

## 2017-04-20 VITALS — BP 142/75 | HR 74 | Temp 98.2°F | Resp 18

## 2017-04-20 DIAGNOSIS — C50411 Malignant neoplasm of upper-outer quadrant of right female breast: Secondary | ICD-10-CM

## 2017-04-20 DIAGNOSIS — Z170 Estrogen receptor positive status [ER+]: Secondary | ICD-10-CM

## 2017-04-20 MED ORDER — FULVESTRANT 250 MG/5ML IM SOLN
500.0000 mg | Freq: Once | INTRAMUSCULAR | Status: AC
  Administered 2017-04-20: 500 mg via INTRAMUSCULAR

## 2017-04-20 MED ORDER — DENOSUMAB 120 MG/1.7ML ~~LOC~~ SOLN
120.0000 mg | Freq: Once | SUBCUTANEOUS | Status: AC
  Administered 2017-04-20: 120 mg via SUBCUTANEOUS

## 2017-04-20 NOTE — Telephone Encounter (Signed)
Per Dr. Jana Hakim pt is to get [facility] today. She is now of Xgeva every three months.  Cyndia Bent RN

## 2017-04-20 NOTE — Addendum Note (Signed)
Addended by: Carlene Coria L on: 04/20/2017 03:01 PM   Modules accepted: Orders

## 2017-04-22 ENCOUNTER — Other Ambulatory Visit (HOSPITAL_COMMUNITY): Payer: BLUE CROSS/BLUE SHIELD

## 2017-04-22 ENCOUNTER — Ambulatory Visit (HOSPITAL_COMMUNITY): Payer: BLUE CROSS/BLUE SHIELD

## 2017-04-22 ENCOUNTER — Telehealth: Payer: Self-pay

## 2017-04-22 NOTE — Telephone Encounter (Signed)
Vanda from [facility] called to inform us that patient canceled all of her scans this morning. Called and left VM for patient checking on her and to see if we need to get those rescheduled.  Cyndia Bent RN
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
## 2017-05-03 ENCOUNTER — Telehealth: Payer: Self-pay | Admitting: *Deleted

## 2017-05-03 ENCOUNTER — Encounter: Payer: Self-pay | Admitting: General Practice

## 2017-05-03 NOTE — Telephone Encounter (Signed)
This RN spoke with pt per her call -  Keeli states " I have not had the scans done for restaging because when I went to obtain I was asked to proceed with payment based on my deductible " " the problem is I have met my deductible but the claims are still in processing - and I just could not pay "  " tomorrow is my 5 year anniversary of when I became metastatic ..."  Malachy Mood then became emotional stating " I am done - I can't do this anymore "  This RN validated pt's feelings - with Tylar stating that last year she had to pay out $8000 dollars due to [facility] stating her subsidy was cancelled only to be told after charging the amount to her credit card - " they said they made a mistake but they refused to reimburse me "  " my husband is working him self to death and my mom is 50 years old trying her best to help me and take care of my son"  " I just wish when this cancer recurred it would've killed me "  Again during conversation this RN validated Maryl's statements - note she apologized several times for " breaking down" with this RN again informing the patient above concerns are real and need to be spoken.  She denies any active suicidal ideation " no my son is 30 and I would never do that to him- he is the main reason I have held on like this "  Lavergne over all feels her family is giving more to her then she is giving to them.  This RN informed pt of appreciation of her concerns as well as goal in this office to not only help her medical diagnosis but to assist with other issues- Calisa has not used any of our support services.  Plan per call is pt will keep scheduled appointment so her plan of care can be re-adressed as well as she is in agreement to this RN sharing her concerns with support services for any additional resources.

## 2017-05-03 NOTE — Progress Notes (Signed)
[facility] Spiritual Care Note  Made appt with Nakita in my office tomorrow, following her other appts, for emotional support and resources per referral from Kingston Dodd/RN.   [place], [place], [facility] Pager [phone] Voicemail [phone]

## 2017-05-04 ENCOUNTER — Inpatient Hospital Stay: Payer: BLUE CROSS/BLUE SHIELD

## 2017-05-04 ENCOUNTER — Inpatient Hospital Stay: Payer: BLUE CROSS/BLUE SHIELD | Attending: Oncology | Admitting: Oncology

## 2017-05-04 ENCOUNTER — Encounter: Payer: Self-pay | Admitting: General Practice

## 2017-05-04 ENCOUNTER — Telehealth: Payer: Self-pay | Admitting: Oncology

## 2017-05-04 VITALS — BP 143/84 | HR 84 | Temp 98.1°F | Resp 18 | Ht 62.0 in | Wt 208.4 lb

## 2017-05-04 DIAGNOSIS — F3289 Other specified depressive episodes: Secondary | ICD-10-CM | POA: Insufficient documentation

## 2017-05-04 DIAGNOSIS — Z9013 Acquired absence of bilateral breasts and nipples: Secondary | ICD-10-CM | POA: Insufficient documentation

## 2017-05-04 DIAGNOSIS — C50411 Malignant neoplasm of upper-outer quadrant of right female breast: Secondary | ICD-10-CM

## 2017-05-04 DIAGNOSIS — Z170 Estrogen receptor positive status [ER+]: Secondary | ICD-10-CM

## 2017-05-04 DIAGNOSIS — Z1379 Encounter for other screening for genetic and chromosomal anomalies: Secondary | ICD-10-CM

## 2017-05-04 DIAGNOSIS — Z79818 Long term (current) use of other agents affecting estrogen receptors and estrogen levels: Secondary | ICD-10-CM | POA: Diagnosis not present

## 2017-05-04 DIAGNOSIS — Z923 Personal history of irradiation: Secondary | ICD-10-CM | POA: Diagnosis not present

## 2017-05-04 DIAGNOSIS — C7800 Secondary malignant neoplasm of unspecified lung: Secondary | ICD-10-CM | POA: Diagnosis not present

## 2017-05-04 DIAGNOSIS — C50919 Malignant neoplasm of unspecified site of unspecified female breast: Secondary | ICD-10-CM

## 2017-05-04 DIAGNOSIS — Z87891 Personal history of nicotine dependence: Secondary | ICD-10-CM | POA: Diagnosis not present

## 2017-05-04 DIAGNOSIS — Z79899 Other long term (current) drug therapy: Secondary | ICD-10-CM | POA: Diagnosis not present

## 2017-05-04 DIAGNOSIS — M791 Myalgia, unspecified site: Secondary | ICD-10-CM | POA: Diagnosis not present

## 2017-05-04 DIAGNOSIS — C773 Secondary and unspecified malignant neoplasm of axilla and upper limb lymph nodes: Secondary | ICD-10-CM | POA: Diagnosis not present

## 2017-05-04 DIAGNOSIS — Z9221 Personal history of antineoplastic chemotherapy: Secondary | ICD-10-CM | POA: Diagnosis not present

## 2017-05-04 MED ORDER — FULVESTRANT 250 MG/5ML IM SOLN
500.0000 mg | Freq: Once | INTRAMUSCULAR | Status: AC
  Administered 2017-05-04: 500 mg via INTRAMUSCULAR

## 2017-05-04 MED ORDER — FULVESTRANT 250 MG/5ML IM SOLN
INTRAMUSCULAR | Status: AC
  Filled 2017-05-04: qty 5

## 2017-05-04 NOTE — Progress Notes (Signed)
[facility] Spiritual Care Note  Had a very meaningful Spiritual Care session with [facility] as scheduled. She shared about her emotional fatigue and existential stuckness related to outliving her original prognosis.  During the course of our conversation, Lajune realized that creativity and playfulness may help her more than cerebral reflection and talking (which are her more standard ways of processing). She also suspects--though she describes herself as "NOT a joiner"--that connecting with other people who are living with metastatic or ultimately terminal disease could help her process and reprioritize.   To those ends, Dillon plans to explore healing arts activities, particularly writing, through [employer] and to test out the Living with Cancer support group for people with advanced cancer.  Vallarie has my card and brochure, and knows that she can contact me anytime to schedule f/u support, to share her writing, or to ask further questions. She verbalized appreciation for Spiritual Care and the opportunity to go deeper in reflective conversation.   [place], [place], [facility] Pager [phone] Voicemail [phone]

## 2017-05-04 NOTE — Telephone Encounter (Signed)
Gave avs and calendar ° °

## 2017-05-04 NOTE — Patient Instructions (Signed)

## 2017-05-05 NOTE — Progress Notes (Signed)
Thank you guys for all you do - Cheryl Moore has been so unassuming in her medical care - I am glad she was able to talk and connect . Hugs to all - Val

## 2017-05-18 ENCOUNTER — Other Ambulatory Visit: Payer: Self-pay

## 2017-05-18 ENCOUNTER — Inpatient Hospital Stay: Payer: BLUE CROSS/BLUE SHIELD

## 2017-05-18 ENCOUNTER — Other Ambulatory Visit: Payer: Self-pay | Admitting: Oncology

## 2017-05-18 VITALS — BP 143/76 | HR 74 | Temp 97.5°F | Resp 16

## 2017-05-18 DIAGNOSIS — C50411 Malignant neoplasm of upper-outer quadrant of right female breast: Secondary | ICD-10-CM

## 2017-05-18 DIAGNOSIS — C50919 Malignant neoplasm of unspecified site of unspecified female breast: Secondary | ICD-10-CM

## 2017-05-18 DIAGNOSIS — C7800 Secondary malignant neoplasm of unspecified lung: Secondary | ICD-10-CM

## 2017-05-18 DIAGNOSIS — Z170 Estrogen receptor positive status [ER+]: Principal | ICD-10-CM

## 2017-05-18 LAB — COMPREHENSIVE METABOLIC PANEL
ALBUMIN: 3.9 g/dL (ref 3.5–5.0)
ALT: 28 U/L (ref 0–55)
AST: 18 U/L (ref 5–34)
Alkaline Phosphatase: 54 U/L (ref 40–150)
Anion gap: 11 (ref 3–11)
BUN: 17 mg/dL (ref 7–26)
CHLORIDE: 109 mmol/L (ref 98–109)
CO2: 20 mmol/L — AB (ref 22–29)
CREATININE: 1.03 mg/dL (ref 0.60–1.10)
Calcium: 9.7 mg/dL (ref 8.4–10.4)
GFR calc Af Amer: >60 mL/min (ref >60)
GFR, EST NON AFRICAN AMERICAN: 59 mL/min — AB (ref >60)
GLUCOSE: 97 mg/dL (ref 70–140)
Potassium: 4.2 mmol/L (ref 3.5–5.1)
Sodium: 140 mmol/L (ref 136–145)
Total Bilirubin: 0.2 mg/dL (ref 0.2–1.2)
Total Protein: 6.7 g/dL (ref 6.4–8.3)

## 2017-05-18 LAB — CBC WITH DIFFERENTIAL/PLATELET
Basophils Absolute: 0.1 10*3/uL (ref 0.0–0.1)
Basophils Relative: 1 %
Eosinophils Absolute: 0.1 10*3/uL (ref 0.0–0.5)
Eosinophils Relative: 1 %
HEMATOCRIT: 39.4 % (ref 34.8–46.6)
Hemoglobin: 13.6 g/dL (ref 11.6–15.9)
LYMPHS ABS: 1.7 10*3/uL (ref 0.9–3.3)
LYMPHS PCT: 32 %
MCH: 34.3 pg — AB (ref 25.1–34.0)
MCHC: 34.5 g/dL (ref 31.5–36.0)
MCV: 99.4 fL (ref 79.5–101.0)
MONOS PCT: 6 %
Monocytes Absolute: 0.3 10*3/uL (ref 0.1–0.9)
NEUTROS ABS: 3.2 10*3/uL (ref 1.5–6.5)
Neutrophils Relative %: 60 %
Platelets: 219 10*3/uL (ref 145–400)
RBC: 3.96 MIL/uL (ref 3.70–5.45)
RDW: 14.3 % (ref 11.2–14.5)
WBC: 5.4 10*3/uL (ref 3.9–10.3)

## 2017-05-18 MED ORDER — FULVESTRANT 250 MG/5ML IM SOLN
500.0000 mg | Freq: Once | INTRAMUSCULAR | Status: AC
  Administered 2017-05-18: 500 mg via INTRAMUSCULAR

## 2017-05-18 MED ORDER — ALPRAZOLAM 0.25 MG PO TABS: 0.2500 mg | ORAL_TABLET | Freq: Every evening | ORAL | 1 refills | Status: DC | PRN

## 2017-05-18 MED ORDER — FULVESTRANT 250 MG/5ML IM SOLN
INTRAMUSCULAR | Status: AC
  Filled 2017-05-18: qty 5

## 2017-05-18 MED ORDER — DENOSUMAB 120 MG/1.7ML ~~LOC~~ SOLN
SUBCUTANEOUS | Status: AC
  Filled 2017-05-18: qty 1.7

## 2017-05-18 MED ORDER — ONDANSETRON 4 MG PO TBDP: 4.0000 mg | ORAL_TABLET | Freq: Three times a day (TID) | ORAL | 0 refills | Status: DC | PRN

## 2017-05-18 NOTE — Patient Instructions (Signed)

## 2017-05-21 ENCOUNTER — Ambulatory Visit: Payer: BLUE CROSS/BLUE SHIELD | Admitting: Obstetrics and Gynecology

## 2017-05-21 ENCOUNTER — Other Ambulatory Visit: Payer: Self-pay

## 2017-05-21 ENCOUNTER — Encounter: Payer: Self-pay | Admitting: Obstetrics and Gynecology

## 2017-05-21 ENCOUNTER — Other Ambulatory Visit (HOSPITAL_COMMUNITY)
Admission: RE | Admit: 2017-05-21 | Discharge: 2017-05-21 | Disposition: A | Discharge status: Home / Self Care | Payer: BLUE CROSS/BLUE SHIELD | Source: Ambulatory Visit | Attending: Obstetrics and Gynecology | Admitting: Obstetrics and Gynecology

## 2017-05-21 VITALS — BP 118/70 | HR 64 | Resp 14 | Ht 62.75 in | Wt 208.0 lb

## 2017-05-21 DIAGNOSIS — C50411 Malignant neoplasm of upper-outer quadrant of right female breast: Secondary | ICD-10-CM

## 2017-05-21 DIAGNOSIS — Z01419 Encounter for gynecological examination (general) (routine) without abnormal findings: Secondary | ICD-10-CM

## 2017-05-21 DIAGNOSIS — Z6837 Body mass index (BMI) 37.0-37.9, adult: Secondary | ICD-10-CM | POA: Diagnosis not present

## 2017-05-21 DIAGNOSIS — Z853 Personal history of malignant neoplasm of breast: Secondary | ICD-10-CM | POA: Diagnosis not present

## 2017-05-21 DIAGNOSIS — F439 Reaction to severe stress, unspecified: Secondary | ICD-10-CM | POA: Diagnosis not present

## 2017-05-21 DIAGNOSIS — E669 Obesity, unspecified: Secondary | ICD-10-CM | POA: Diagnosis not present

## 2017-05-21 DIAGNOSIS — F17210 Nicotine dependence, cigarettes, uncomplicated: Secondary | ICD-10-CM | POA: Insufficient documentation

## 2017-05-21 MED ORDER — TRAMADOL HCL 50 MG PO TABS: 50.0000 mg | ORAL_TABLET | Freq: Four times a day (QID) | ORAL | 1 refills | Status: DC | PRN

## 2017-05-21 NOTE — Progress Notes (Signed)
57 y.o. G68P1001 Married Caucasian female here for annual exam.    Having some nausea/vomiting/diarrhea.   Tired and overwhelmed with the distress that feels due to her cancer care.  PCP: No PCP    Patient's last menstrual period was 03/26/2013.           Sexually active: Yes.    The current method of family planning is post menopausal status.    Exercising: Yes.    swimming, biking, gardening Smoker:  no  Health Maintenance: Pap:  05-17-15 Neg:Neg HR HPV History of abnormal Pap:  no MMG:  05-03-12 Dx'd with Rt.breast cancer--Bil.mastectomy Colonoscopy:  01/12/17 Polyps removed; f/u 3 years BMD:   n/a  Result  n/a TDaP:  2014 Gardasil:   n/a HIV: negative in the past Hep C: 05/17/15 Negative Screening Labs: Oncology   reports that she quit smoking about 15 years ago. Her smoking use included cigarettes. She has a 1.25 pack-year smoking history. She has never used smokeless tobacco. She reports that she does not drink alcohol or use drugs.  Past Medical History:  Diagnosis Date  . Anxiety   . Breast cancer ([place]) 2014   right  . Elevated hemoglobin A1c 11/05/14   level 6.2  . History of radiation therapy 11/28/12-03/06/13   right breast/64.4Gy/ right hip=37.5Gy   . Hot flashes   . 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 Obesity   . Personal history of colonic polyps - ssp and adenomas 09/19/2013    Past Surgical History:  Procedure Laterality Date  . BREAST BIOPSY Right 05/23/2012   Procedure: SKIN PUNCH BIOPSY RIGHT BREAST;  Surgeon: [name], MD;  Location: [place];  Service: General;  Laterality: Right;  . BREAST SURGERY Right    breast bx  . CESAREAN SECTION  2005  . LAPAROSCOPIC SALPINGO OOPHERECTOMY Bilateral 10/22/2014   Procedure: LAPAROSCOPIC SALPINGO OOPHORECTOMY;  Surgeon: Nunzio Cobbs, MD;  Location: [facility];  Service: Gynecology;  Laterality: Bilateral;  BMI 41.82  . MASTECTOMY MODIFIED RADICAL Right 10/26/2012   Procedure:  MASTECTOMY MODIFIED RADICAL;  Surgeon: [name], MD;  Location: [place];  Service: General;  Laterality: Right;  . PORT-A-CATH REMOVAL Left 04/10/2014   Procedure: REMOVAL PORT-A-CATH;  Surgeon: [name], MD;  Location: [place];  Service: General;  Laterality: Left;  . PORTACATH PLACEMENT Left 05/23/2012   Procedure: INSERTION PORT-A-CATH;  Surgeon: [name], MD;  Location: [place];  Service: General;  Laterality: Left;  . RADIOLOGY WITH ANESTHESIA N/A 08/24/2013   Procedure: MRI;  Surgeon: Medication Radiologist, MD;  Location: [place];  Service: Radiology;  Laterality: N/A;  . SIMPLE MASTECTOMY WITH AXILLARY SENTINEL NODE BIOPSY Left 10/26/2012   Procedure: TOTAL MASTECTOMY;  Surgeon: [name], MD;  Location: [place];  Service: General;  Laterality: Left;  . TOTAL MASTECTOMY Left 10/26/2012   Dr Donne Hazel    Current Outpatient Medications  Medication Sig Dispense Refill  . ALPRAZolam (XANAX) 0.25 MG tablet Take 1 tablet (0.25 mg total) by mouth at bedtime as needed. 30 tablet 1  . Ascorbic Acid (VITAMIN C PO) Take 1 tablet by mouth daily.     Marland Kitchen CALCIUM PO Take 1 tablet by mouth daily.    . fulvestrant (FASLODEX) 250 MG/5ML injection Inject into the muscle once. One injection each buttock over 1-2 minutes. Warm prior to use.    . gabapentin (NEURONTIN) 100 MG capsule TAKE 1 CAPSULE (100 MG TOTAL) BY MOUTH DAILY. [place]  capsule 3  . gabapentin (NEURONTIN) 300 MG capsule Take 1 capsule (300 mg total) by mouth at bedtime. 90 capsule 3  . IBRANCE 100 MG capsule TAKE ONE CAPSULE (100 MG) BY MOUTH DAILY WITH FOOD FOR 21 DAYS OF A 28DAY CYCLE 21 capsule 2  . ibuprofen (ADVIL,MOTRIN) 800 MG tablet Take 1 tablet (800 mg total) by mouth every 8 (eight) hours as needed. 30 tablet 0  . Multiple Vitamin (MULTIVITAMIN) tablet Take 1 tablet by mouth daily.    . ondansetron (ZOFRAN-ODT) 4 MG disintegrating tablet Take 1 tablet (4 mg total) by mouth every 8 (eight) hours as needed for  nausea or vomiting. 20 tablet 0  . psyllium (METAMUCIL) 58.6 % powder Take 1 packet by mouth daily. Reported on 07/26/2015    . traMADol (ULTRAM) 50 MG tablet Take 1 tablet (50 mg total) by mouth every 6 (six) hours as needed. for pain 60 tablet 1  . valACYclovir (VALTREX) 500 MG tablet Take 1 tablet (500 mg total) by mouth 2 (two) times daily. 60 tablet 1   No current facility-administered medications for this visit.     Family History  Adopted: Yes    Review of Systems  Constitutional: Negative.   HENT: Negative.   Eyes: Negative.   Respiratory: Negative.   Cardiovascular: Negative.   Gastrointestinal: Negative.   Endocrine: Negative.   Genitourinary: Negative.   Musculoskeletal: Negative.   Skin: Negative.   Allergic/Immunologic: Negative.   Neurological: Negative.   Hematological: Negative.   Psychiatric/Behavioral: Negative.     Exam:   BP 118/70 (BP Location: Left Arm, Patient Position: Sitting, Cuff Size: Large)   Pulse 64   Resp 14   Ht 5' 2.75" (1.594 m)   Wt 208 lb (94.3 kg)   LMP 03/26/2013   BMI 37.14 kg/m     General appearance: alert, cooperative and appears stated age Head: Normocephalic, without obvious abnormality, atraumatic Neck: no adenopathy, supple, symmetrical, trachea midline and thyroid normal to inspection and palpation Lungs: clear to auscultation bilaterally Breasts: absent.  Radiation changes and scarring on the right chest wall.   Heart: regular rate and rhythm Abdomen: soft, non-tender; no masses, no organomegaly Extremities: extremities normal, atraumatic, no cyanosis or edema Skin: Skin color, texture, turgor normal. No rashes or lesions Lymph nodes: Cervical, supraclavicular, and axillary nodes normal. No abnormal inguinal nodes palpated Neurologic: Grossly normal  Pelvic: External genitalia:  no lesions              Urethra:  normal appearing urethra with no masses, tenderness or lesions              Bartholins and Skenes: normal                  Vagina: normal appearing vagina with normal color and discharge, no lesions              Cervix: no lesions              Pap taken: Yes.   Bimanual Exam:  Uterus:  normal size, contour, position, consistency, mobility, non-tender              Adnexa: no mass, fullness, tenderness              Rectal exam: Yes.  .  Confirms.              Anus:  normal sphincter tone, no lesions  Chaperone was present for exam.  Assessment:   Well woman  visit with normal exam. Status post bilateral mastectomy for metastatic breast cancer, stage IV. On Faslodex and Ibrance.  Status post laparoscopic BSO.  Stress and worry.  Plan: Mammogram screening. Recommended self breast awareness. Pap and HR HPV as above. Guidelines for Calcium, Vitamin D, regular exercise program including cardiovascular and weight bearing exercise. We spent some good time talking about reaching out for help from professionals - legal counsel, an accountant, a potential real estate agent, and a psychologist.  I recommend she consider seeking care from a psychologist outside the [facility]. I have given her the name of [facility].   Follow up annually and prn.   After visit summary provided.

## 2017-05-21 NOTE — Patient Instructions (Signed)

## 2017-05-25 LAB — CYTOLOGY - PAP
DIAGNOSIS: NEGATIVE
HPV (WINDOPATH): NOT DETECTED

## 2017-06-15 ENCOUNTER — Other Ambulatory Visit: Payer: Self-pay | Admitting: *Deleted

## 2017-06-15 ENCOUNTER — Inpatient Hospital Stay: Payer: BLUE CROSS/BLUE SHIELD | Attending: Oncology

## 2017-06-15 ENCOUNTER — Other Ambulatory Visit: Payer: Self-pay | Admitting: Oncology

## 2017-06-15 ENCOUNTER — Inpatient Hospital Stay: Payer: BLUE CROSS/BLUE SHIELD

## 2017-06-15 DIAGNOSIS — C50411 Malignant neoplasm of upper-outer quadrant of right female breast: Secondary | ICD-10-CM

## 2017-06-15 DIAGNOSIS — Z170 Estrogen receptor positive status [ER+]: Secondary | ICD-10-CM | POA: Diagnosis not present

## 2017-06-15 DIAGNOSIS — C7800 Secondary malignant neoplasm of unspecified lung: Secondary | ICD-10-CM | POA: Insufficient documentation

## 2017-06-15 DIAGNOSIS — C50919 Malignant neoplasm of unspecified site of unspecified female breast: Secondary | ICD-10-CM

## 2017-06-15 LAB — COMPREHENSIVE METABOLIC PANEL
ALT: 23 U/L (ref 0–55)
AST: 17 U/L (ref 5–34)
Albumin: 3.9 g/dL (ref 3.5–5.0)
Alkaline Phosphatase: 54 U/L (ref 40–150)
Anion gap: 8 (ref 3–11)
BILIRUBIN TOTAL: 0.3 mg/dL (ref 0.2–1.2)
BUN: 16 mg/dL (ref 7–26)
CHLORIDE: 112 mmol/L — AB (ref 98–109)
CO2: 20 mmol/L — ABNORMAL LOW (ref 22–29)
Calcium: 8.5 mg/dL (ref 8.4–10.4)
Creatinine, Ser: 1.12 mg/dL — ABNORMAL HIGH (ref 0.60–1.10)
GFR calc Af Amer: >60 mL/min (ref >60)
GFR calc non Af Amer: 53 mL/min — ABNORMAL LOW (ref >60)
Glucose, Bld: 96 mg/dL (ref 70–140)
POTASSIUM: 4.3 mmol/L (ref 3.5–5.1)
Sodium: 140 mmol/L (ref 136–145)
TOTAL PROTEIN: 6.9 g/dL (ref 6.4–8.3)

## 2017-06-15 LAB — CBC WITH DIFFERENTIAL/PLATELET
BASOS PCT: 1 %
Basophils Absolute: 0 10*3/uL (ref 0.0–0.1)
EOS ABS: 0 10*3/uL (ref 0.0–0.5)
Eosinophils Relative: 1 %
HEMATOCRIT: 37.1 % (ref 34.8–46.6)
HEMOGLOBIN: 12.9 g/dL (ref 11.6–15.9)
LYMPHS ABS: 1.6 10*3/uL (ref 0.9–3.3)
Lymphocytes Relative: 52 %
MCH: 34.4 pg — AB (ref 25.1–34.0)
MCHC: 34.6 g/dL (ref 31.5–36.0)
MCV: 99.2 fL (ref 79.5–101.0)
MONOS PCT: 7 %
Monocytes Absolute: 0.2 10*3/uL (ref 0.1–0.9)
NEUTROS ABS: 1.2 10*3/uL — AB (ref 1.5–6.5)
NEUTROS PCT: 39 %
Platelets: 209 10*3/uL (ref 145–400)
RBC: 3.74 MIL/uL (ref 3.70–5.45)
RDW: 14.2 % (ref 11.2–14.5)
WBC: 3.1 10*3/uL — AB (ref 3.9–10.3)

## 2017-06-15 MED ORDER — FULVESTRANT 250 MG/5ML IM SOLN
500.0000 mg | Freq: Once | INTRAMUSCULAR | Status: AC
  Administered 2017-06-15: 500 mg via INTRAMUSCULAR

## 2017-06-15 MED ORDER — DENOSUMAB 120 MG/1.7ML ~~LOC~~ SOLN
120.0000 mg | Freq: Once | SUBCUTANEOUS | Status: AC
  Administered 2017-06-15: 120 mg via SUBCUTANEOUS

## 2017-06-15 MED ORDER — FULVESTRANT 250 MG/5ML IM SOLN
INTRAMUSCULAR | Status: AC
Start: 2017-06-15 — End: 2017-06-15
  Filled 2017-06-15: qty 10

## 2017-06-15 MED ORDER — DENOSUMAB 120 MG/1.7ML ~~LOC~~ SOLN
SUBCUTANEOUS | Status: AC
  Filled 2017-06-15: qty 1.7

## 2017-06-15 NOTE — Patient Instructions (Signed)
Fulvestrant injection What is this medicine? FULVESTRANT (ful VES trant) blocks the effects of estrogen. It is used to treat breast cancer. This medicine may be used for other purposes; ask your health care provider or pharmacist if you have questions. COMMON BRAND NAME(S): FASLODEX What should I tell my health care provider before I take this medicine? They need to know if you have any of these conditions: -bleeding problems -liver disease -low levels of platelets in the blood -an unusual or allergic reaction to fulvestrant, other medicines, foods, dyes, or preservatives -pregnant or trying to get pregnant -breast-feeding How should I use this medicine? This medicine is for injection into a muscle. It is usually given by a health care professional in a hospital or clinic setting. Talk to your pediatrician regarding the use of this medicine in children. Special care may be needed. Overdosage: If you think you have taken too much of this medicine contact a poison control center or emergency room at once. NOTE: This medicine is only for you. Do not share this medicine with others. What if I miss a dose? It is important not to miss your dose. Call your doctor or health care professional if you are unable to keep an appointment. What may interact with this medicine? -medicines that treat or prevent blood clots like warfarin, enoxaparin, and dalteparin This list may not describe all possible interactions. Give your health care provider a list of all the medicines, herbs, non-prescription drugs, or dietary supplements you use. Also tell them if you smoke, drink alcohol, or use illegal drugs. Some items may interact with your medicine. What should I watch for while using this medicine? Your condition will be monitored carefully while you are receiving this medicine. You will need important blood work done while you are taking this medicine. Do not become pregnant while taking this medicine or for  at least 1 year after stopping it. Women of child-bearing potential will need to have a negative pregnancy test before starting this medicine. Women should inform their doctor if they wish to become pregnant or think they might be pregnant. There is a potential for serious side effects to an unborn child. Men should inform their doctors if they wish to father a child. This medicine may lower sperm counts. Talk to your health care professional or pharmacist for more information. Do not breast-feed an infant while taking this medicine or for 1 year after the last dose. What side effects may I notice from receiving this medicine? Side effects that you should report to your doctor or health care professional as soon as possible: -allergic reactions like skin rash, itching or hives, swelling of the face, lips, or tongue -feeling faint or lightheaded, falls -pain, tingling, numbness, or weakness in the legs -signs and symptoms of infection like fever or chills; cough; flu-like symptoms; sore throat -vaginal bleeding Side effects that usually do not require medical attention (report to your doctor or health care professional if they continue or are bothersome): -aches, pains -constipation -diarrhea -headache -hot flashes -nausea, vomiting -pain at site where injected -stomach pain This list may not describe all possible side effects. Call your doctor for medical advice about side effects. You may report side effects to FDA at 1-800-FDA-1088. Where should I keep my medicine? This drug is given in a hospital or clinic and will not be stored at home. NOTE: This sheet is a summary. It may not cover all possible information. If you have questions about this medicine, talk to your  doctor, pharmacist, or health care provider.  2018 Elsevier/Gold Standard (2014-08-10 11:03:55) Denosumab injection What is this medicine? DENOSUMAB (den oh sue mab) slows bone breakdown. Prolia is used to treat osteoporosis in  women after menopause and in men. Delton See is used to treat a high calcium level due to cancer and to prevent bone fractures and other bone problems caused by multiple myeloma or cancer bone metastases. Delton See is also used to treat giant cell tumor of the bone. This medicine may be used for other purposes; ask your health care provider or pharmacist if you have questions. COMMON BRAND NAME(S): Prolia, XGEVA What should I tell my health care provider before I take this medicine? They need to know if you have any of these conditions: -dental disease -having surgery or tooth extraction -infection -kidney disease -low levels of calcium or Vitamin D in the blood -malnutrition -on hemodialysis -skin conditions or sensitivity -thyroid or parathyroid disease -an unusual reaction to denosumab, other medicines, foods, dyes, or preservatives -pregnant or trying to get pregnant -breast-feeding How should I use this medicine? This medicine is for injection under the skin. It is given by a health care professional in a hospital or clinic setting. If you are getting Prolia, a special MedGuide will be given to you by the pharmacist with each prescription and refill. Be sure to read this information carefully each time. For Prolia, talk to your pediatrician regarding the use of this medicine in children. Special care may be needed. For Delton See, talk to your pediatrician regarding the use of this medicine in children. While this drug may be prescribed for children as young as 13 years for selected conditions, precautions do apply. Overdosage: If you think you have taken too much of this medicine contact a poison control center or emergency room at once. NOTE: This medicine is only for you. Do not share this medicine with others. What if I miss a dose? It is important not to miss your dose. Call your doctor or health care professional if you are unable to keep an appointment. What may interact with this  medicine? Do not take this medicine with any of the following medications: -other medicines containing denosumab This medicine may also interact with the following medications: -medicines that lower your chance of fighting infection -steroid medicines like prednisone or cortisone This list may not describe all possible interactions. Give your health care provider a list of all the medicines, herbs, non-prescription drugs, or dietary supplements you use. Also tell them if you smoke, drink alcohol, or use illegal drugs. Some items may interact with your medicine. What should I watch for while using this medicine? Visit your doctor or health care professional for regular checks on your progress. Your doctor or health care professional may order blood tests and other tests to see how you are doing. Call your doctor or health care professional for advice if you get a fever, chills or sore throat, or other symptoms of a cold or flu. Do not treat yourself. This drug may decrease your body's ability to fight infection. Try to avoid being around people who are sick. You should make sure you get enough calcium and vitamin D while you are taking this medicine, unless your doctor tells you not to. Discuss the foods you eat and the vitamins you take with your health care professional. See your dentist regularly. Brush and floss your teeth as directed. Before you have any dental work done, tell your dentist you are receiving this medicine. Do  not become pregnant while taking this medicine or for 5 months after stopping it. Talk with your doctor or health care professional about your birth control options while taking this medicine. Women should inform their doctor if they wish to become pregnant or think they might be pregnant. There is a potential for serious side effects to an unborn child. Talk to your health care professional or pharmacist for more information. What side effects may I notice from receiving this  medicine? Side effects that you should report to your doctor or health care professional as soon as possible: -allergic reactions like skin rash, itching or hives, swelling of the face, lips, or tongue -bone pain -breathing problems -dizziness -jaw pain, especially after dental work -redness, blistering, peeling of the skin -signs and symptoms of infection like fever or chills; cough; sore throat; pain or trouble passing urine -signs of low calcium like fast heartbeat, muscle cramps or muscle pain; pain, tingling, numbness in the hands or feet; seizures -unusual bleeding or bruising -unusually weak or tired Side effects that usually do not require medical attention (report to your doctor or health care professional if they continue or are bothersome): -constipation -diarrhea -headache -joint pain -loss of appetite -muscle pain -runny nose -tiredness -upset stomach This list may not describe all possible side effects. Call your doctor for medical advice about side effects. You may report side effects to FDA at 1-800-FDA-1088. Where should I keep my medicine? This medicine is only given in a clinic, doctor's office, or other health care setting and will not be stored at home. NOTE: This sheet is a summary. It may not cover all possible information. If you have questions about this medicine, talk to your doctor, pharmacist, or health care provider.  2018 Elsevier/Gold Standard (2016-02-04 19:17:21)

## 2017-06-23 ENCOUNTER — Ambulatory Visit (INDEPENDENT_AMBULATORY_CARE_PROVIDER_SITE_OTHER): Payer: BLUE CROSS/BLUE SHIELD | Admitting: Psychiatry

## 2017-06-23 DIAGNOSIS — F4323 Adjustment disorder with mixed anxiety and depressed mood: Secondary | ICD-10-CM | POA: Diagnosis not present

## 2017-07-07 ENCOUNTER — Ambulatory Visit (INDEPENDENT_AMBULATORY_CARE_PROVIDER_SITE_OTHER): Payer: BLUE CROSS/BLUE SHIELD | Admitting: Psychiatry

## 2017-07-07 DIAGNOSIS — F4323 Adjustment disorder with mixed anxiety and depressed mood: Secondary | ICD-10-CM

## 2017-07-13 ENCOUNTER — Inpatient Hospital Stay: Payer: BLUE CROSS/BLUE SHIELD

## 2017-07-13 ENCOUNTER — Inpatient Hospital Stay: Payer: BLUE CROSS/BLUE SHIELD | Attending: Oncology

## 2017-07-13 DIAGNOSIS — C50919 Malignant neoplasm of unspecified site of unspecified female breast: Secondary | ICD-10-CM

## 2017-07-13 DIAGNOSIS — Z79818 Long term (current) use of other agents affecting estrogen receptors and estrogen levels: Secondary | ICD-10-CM | POA: Diagnosis not present

## 2017-07-13 DIAGNOSIS — C7800 Secondary malignant neoplasm of unspecified lung: Secondary | ICD-10-CM | POA: Diagnosis not present

## 2017-07-13 DIAGNOSIS — C50411 Malignant neoplasm of upper-outer quadrant of right female breast: Secondary | ICD-10-CM | POA: Insufficient documentation

## 2017-07-13 DIAGNOSIS — Z170 Estrogen receptor positive status [ER+]: Secondary | ICD-10-CM | POA: Insufficient documentation

## 2017-07-13 LAB — CBC WITH DIFFERENTIAL/PLATELET
BASOS ABS: 0 10*3/uL (ref 0.0–0.1)
Basophils Relative: 1 %
EOS ABS: 0 10*3/uL (ref 0.0–0.5)
EOS PCT: 1 %
HCT: 37.1 % (ref 34.8–46.6)
Hemoglobin: 12.9 g/dL (ref 11.6–15.9)
LYMPHS PCT: 54 %
Lymphs Abs: 1.5 10*3/uL (ref 0.9–3.3)
MCH: 34.1 pg — AB (ref 25.1–34.0)
MCHC: 34.8 g/dL (ref 31.5–36.0)
MCV: 98.1 fL (ref 79.5–101.0)
MONO ABS: 0.2 10*3/uL (ref 0.1–0.9)
Monocytes Relative: 5 %
Neutro Abs: 1.1 10*3/uL — ABNORMAL LOW (ref 1.5–6.5)
Neutrophils Relative %: 39 %
PLATELETS: 241 10*3/uL (ref 145–400)
RBC: 3.78 MIL/uL (ref 3.70–5.45)
RDW: 14.6 % — AB (ref 11.2–14.5)
WBC: 2.9 10*3/uL — AB (ref 3.9–10.3)

## 2017-07-13 LAB — COMPREHENSIVE METABOLIC PANEL
ALBUMIN: 4.2 g/dL (ref 3.5–5.0)
ALK PHOS: 53 U/L (ref 40–150)
ALT: 19 U/L (ref 0–55)
AST: 14 U/L (ref 5–34)
Anion gap: 10 (ref 3–11)
BILIRUBIN TOTAL: 0.3 mg/dL (ref 0.2–1.2)
BUN: 19 mg/dL (ref 7–26)
CO2: 21 mmol/L — ABNORMAL LOW (ref 22–29)
CREATININE: 1.26 mg/dL — AB (ref 0.60–1.10)
Calcium: 9.9 mg/dL (ref 8.4–10.4)
Chloride: 109 mmol/L (ref 98–109)
GFR calc Af Amer: 54 mL/min — ABNORMAL LOW (ref >60)
GFR, EST NON AFRICAN AMERICAN: 46 mL/min — AB (ref >60)
GLUCOSE: 100 mg/dL (ref 70–140)
Potassium: 4.3 mmol/L (ref 3.5–5.1)
Sodium: 140 mmol/L (ref 136–145)
TOTAL PROTEIN: 7 g/dL (ref 6.4–8.3)

## 2017-07-13 MED ORDER — FULVESTRANT 250 MG/5ML IM SOLN
500.0000 mg | Freq: Once | INTRAMUSCULAR | Status: AC
  Administered 2017-07-13: 500 mg via INTRAMUSCULAR

## 2017-07-13 MED ORDER — FULVESTRANT 250 MG/5ML IM SOLN
INTRAMUSCULAR | Status: AC
  Filled 2017-07-13: qty 10

## 2017-07-13 NOTE — Patient Instructions (Signed)

## 2017-07-21 ENCOUNTER — Ambulatory Visit (INDEPENDENT_AMBULATORY_CARE_PROVIDER_SITE_OTHER): Payer: BLUE CROSS/BLUE SHIELD | Admitting: Psychiatry

## 2017-07-21 DIAGNOSIS — F4323 Adjustment disorder with mixed anxiety and depressed mood: Secondary | ICD-10-CM | POA: Diagnosis not present

## 2017-07-23 ENCOUNTER — Other Ambulatory Visit: Payer: Self-pay

## 2017-07-23 ENCOUNTER — Other Ambulatory Visit: Payer: Self-pay | Admitting: Oncology

## 2017-07-23 DIAGNOSIS — C50411 Malignant neoplasm of upper-outer quadrant of right female breast: Secondary | ICD-10-CM

## 2017-07-23 MED ORDER — ALPRAZOLAM 0.25 MG PO TABS: 0.2500 mg | ORAL_TABLET | Freq: Every evening | ORAL | 1 refills | Status: DC | PRN

## 2017-07-26 ENCOUNTER — Other Ambulatory Visit: Payer: Self-pay | Admitting: Oncology

## 2017-07-26 DIAGNOSIS — C50411 Malignant neoplasm of upper-outer quadrant of right female breast: Secondary | ICD-10-CM

## 2017-07-30 ENCOUNTER — Other Ambulatory Visit: Payer: Self-pay | Admitting: Oncology

## 2017-07-30 DIAGNOSIS — C50411 Malignant neoplasm of upper-outer quadrant of right female breast: Secondary | ICD-10-CM

## 2017-08-04 ENCOUNTER — Ambulatory Visit (INDEPENDENT_AMBULATORY_CARE_PROVIDER_SITE_OTHER): Payer: BLUE CROSS/BLUE SHIELD | Admitting: Psychiatry

## 2017-08-04 DIAGNOSIS — F4323 Adjustment disorder with mixed anxiety and depressed mood: Secondary | ICD-10-CM | POA: Diagnosis not present

## 2017-08-05 ENCOUNTER — Other Ambulatory Visit: Payer: Self-pay | Admitting: Oncology
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
## 2017-08-10 ENCOUNTER — Telehealth: Payer: Self-pay | Admitting: Oncology

## 2017-08-10 ENCOUNTER — Inpatient Hospital Stay: Payer: BLUE CROSS/BLUE SHIELD

## 2017-08-10 ENCOUNTER — Inpatient Hospital Stay (HOSPITAL_BASED_OUTPATIENT_CLINIC_OR_DEPARTMENT_OTHER): Payer: BLUE CROSS/BLUE SHIELD | Admitting: Oncology

## 2017-08-10 ENCOUNTER — Inpatient Hospital Stay: Payer: BLUE CROSS/BLUE SHIELD | Attending: Oncology

## 2017-08-10 VITALS — BP 132/82 | HR 66 | Temp 98.6°F | Resp 18 | Ht 62.75 in | Wt 207.9 lb

## 2017-08-10 DIAGNOSIS — Z9221 Personal history of antineoplastic chemotherapy: Secondary | ICD-10-CM

## 2017-08-10 DIAGNOSIS — Z170 Estrogen receptor positive status [ER+]: Principal | ICD-10-CM

## 2017-08-10 DIAGNOSIS — Z923 Personal history of irradiation: Secondary | ICD-10-CM | POA: Diagnosis not present

## 2017-08-10 DIAGNOSIS — Z87891 Personal history of nicotine dependence: Secondary | ICD-10-CM | POA: Diagnosis not present

## 2017-08-10 DIAGNOSIS — Z79899 Other long term (current) drug therapy: Secondary | ICD-10-CM | POA: Insufficient documentation

## 2017-08-10 DIAGNOSIS — F419 Anxiety disorder, unspecified: Secondary | ICD-10-CM | POA: Diagnosis not present

## 2017-08-10 DIAGNOSIS — C50411 Malignant neoplasm of upper-outer quadrant of right female breast: Secondary | ICD-10-CM

## 2017-08-10 DIAGNOSIS — Z79818 Long term (current) use of other agents affecting estrogen receptors and estrogen levels: Secondary | ICD-10-CM | POA: Insufficient documentation

## 2017-08-10 DIAGNOSIS — Z9013 Acquired absence of bilateral breasts and nipples: Secondary | ICD-10-CM | POA: Diagnosis not present

## 2017-08-10 DIAGNOSIS — C7800 Secondary malignant neoplasm of unspecified lung: Secondary | ICD-10-CM

## 2017-08-10 DIAGNOSIS — R197 Diarrhea, unspecified: Secondary | ICD-10-CM

## 2017-08-10 DIAGNOSIS — C50919 Malignant neoplasm of unspecified site of unspecified female breast: Secondary | ICD-10-CM

## 2017-08-10 LAB — CBC WITH DIFFERENTIAL/PLATELET
BASOS PCT: 1 %
Basophils Absolute: 0.1 10*3/uL (ref 0.0–0.1)
EOS ABS: 0.1 10*3/uL (ref 0.0–0.5)
EOS PCT: 1 %
HCT: 39.2 % (ref 34.8–46.6)
Hemoglobin: 13.7 g/dL (ref 11.6–15.9)
LYMPHS ABS: 1.7 10*3/uL (ref 0.9–3.3)
Lymphocytes Relative: 38 %
MCH: 35.2 pg — AB (ref 25.1–34.0)
MCHC: 34.9 g/dL (ref 31.5–36.0)
MCV: 100.9 fL (ref 79.5–101.0)
Monocytes Absolute: 0.3 10*3/uL (ref 0.1–0.9)
Monocytes Relative: 8 %
Neutro Abs: 2.3 10*3/uL (ref 1.5–6.5)
Neutrophils Relative %: 52 %
PLATELETS: 264 10*3/uL (ref 145–400)
RBC: 3.88 MIL/uL (ref 3.70–5.45)
RDW: 16.3 % — ABNORMAL HIGH (ref 11.2–14.5)
WBC: 4.4 10*3/uL (ref 3.9–10.3)

## 2017-08-10 LAB — COMPREHENSIVE METABOLIC PANEL
ALT: 28 U/L (ref 0–44)
ANION GAP: 10 (ref 5–15)
AST: 18 U/L (ref 15–41)
Albumin: 4.4 g/dL (ref 3.5–5.0)
Alkaline Phosphatase: 58 U/L (ref 38–126)
BUN: 11 mg/dL (ref 6–20)
CALCIUM: 9.3 mg/dL (ref 8.9–10.3)
CHLORIDE: 111 mmol/L (ref 98–111)
CO2: 19 mmol/L — ABNORMAL LOW (ref 22–32)
CREATININE: 1.14 mg/dL — AB (ref 0.44–1.00)
GFR calc non Af Amer: 52 mL/min — ABNORMAL LOW (ref >60)
Glucose, Bld: 110 mg/dL — ABNORMAL HIGH (ref 70–99)
Potassium: 3.8 mmol/L (ref 3.5–5.1)
SODIUM: 140 mmol/L (ref 135–145)
TOTAL PROTEIN: 7.3 g/dL (ref 6.5–8.1)
Total Bilirubin: 0.4 mg/dL (ref 0.3–1.2)

## 2017-08-10 MED ORDER — FULVESTRANT 250 MG/5ML IM SOLN
INTRAMUSCULAR | Status: AC
  Filled 2017-08-10: qty 5

## 2017-08-10 MED ORDER — DENOSUMAB 120 MG/1.7ML ~~LOC~~ SOLN
SUBCUTANEOUS | Status: AC
  Filled 2017-08-10: qty 1.7

## 2017-08-10 MED ORDER — FULVESTRANT 250 MG/5ML IM SOLN
500.0000 mg | Freq: Once | INTRAMUSCULAR | Status: AC
  Administered 2017-08-10: 500 mg via INTRAMUSCULAR

## 2017-08-10 MED ORDER — DENOSUMAB 120 MG/1.7ML ~~LOC~~ SOLN
120.0000 mg | Freq: Once | SUBCUTANEOUS | Status: AC
  Administered 2017-08-10: 120 mg via SUBCUTANEOUS

## 2017-08-10 NOTE — Patient Instructions (Signed)
Fulvestrant injection What is this medicine? FULVESTRANT (ful VES trant) blocks the effects of estrogen. It is used to treat breast cancer. This medicine may be used for other purposes; ask your health care provider or pharmacist if you have questions. COMMON BRAND NAME(S): FASLODEX What should I tell my health care provider before I take this medicine? They need to know if you have any of these conditions: -bleeding problems -liver disease -low levels of platelets in the blood -an unusual or allergic reaction to fulvestrant, other medicines, foods, dyes, or preservatives -pregnant or trying to get pregnant -breast-feeding How should I use this medicine? This medicine is for injection into a muscle. It is usually given by a health care professional in a hospital or clinic setting. Talk to your pediatrician regarding the use of this medicine in children. Special care may be needed. Overdosage: If you think you have taken too much of this medicine contact a poison control center or emergency room at once. NOTE: This medicine is only for you. Do not share this medicine with others. What if I miss a dose? It is important not to miss your dose. Call your doctor or health care professional if you are unable to keep an appointment. What may interact with this medicine? -medicines that treat or prevent blood clots like warfarin, enoxaparin, and dalteparin This list may not describe all possible interactions. Give your health care provider a list of all the medicines, herbs, non-prescription drugs, or dietary supplements you use. Also tell them if you smoke, drink alcohol, or use illegal drugs. Some items may interact with your medicine. What should I watch for while using this medicine? Your condition will be monitored carefully while you are receiving this medicine. You will need important blood work done while you are taking this medicine. Do not become pregnant while taking this medicine or for  at least 1 year after stopping it. Women of child-bearing potential will need to have a negative pregnancy test before starting this medicine. Women should inform their doctor if they wish to become pregnant or think they might be pregnant. There is a potential for serious side effects to an unborn child. Men should inform their doctors if they wish to father a child. This medicine may lower sperm counts. Talk to your health care professional or pharmacist for more information. Do not breast-feed an infant while taking this medicine or for 1 year after the last dose. What side effects may I notice from receiving this medicine? Side effects that you should report to your doctor or health care professional as soon as possible: -allergic reactions like skin rash, itching or hives, swelling of the face, lips, or tongue -feeling faint or lightheaded, falls -pain, tingling, numbness, or weakness in the legs -signs and symptoms of infection like fever or chills; cough; flu-like symptoms; sore throat -vaginal bleeding Side effects that usually do not require medical attention (report to your doctor or health care professional if they continue or are bothersome): -aches, pains -constipation -diarrhea -headache -hot flashes -nausea, vomiting -pain at site where injected -stomach pain This list may not describe all possible side effects. Call your doctor for medical advice about side effects. You may report side effects to FDA at 1-800-FDA-1088. Where should I keep my medicine? This drug is given in a hospital or clinic and will not be stored at home. NOTE: This sheet is a summary. It may not cover all possible information. If you have questions about this medicine, talk to your   doctor, pharmacist, or health care provider.  2018 Elsevier/Gold Standard (2014-08-10 11:03:55) Denosumab injection What is this medicine? DENOSUMAB (den oh sue mab) slows bone breakdown. Prolia is used to treat osteoporosis in  women after menopause and in men. Delton See is used to treat a high calcium level due to cancer and to prevent bone fractures and other bone problems caused by multiple myeloma or cancer bone metastases. Delton See is also used to treat giant cell tumor of the bone. This medicine may be used for other purposes; ask your health care provider or pharmacist if you have questions. COMMON BRAND NAME(S): Prolia, XGEVA What should I tell my health care provider before I take this medicine? They need to know if you have any of these conditions: -dental disease -having surgery or tooth extraction -infection -kidney disease -low levels of calcium or Vitamin D in the blood -malnutrition -on hemodialysis -skin conditions or sensitivity -thyroid or parathyroid disease -an unusual reaction to denosumab, other medicines, foods, dyes, or preservatives -pregnant or trying to get pregnant -breast-feeding How should I use this medicine? This medicine is for injection under the skin. It is given by a health care professional in a hospital or clinic setting. If you are getting Prolia, a special MedGuide will be given to you by the pharmacist with each prescription and refill. Be sure to read this information carefully each time. For Prolia, talk to your pediatrician regarding the use of this medicine in children. Special care may be needed. For Delton See, talk to your pediatrician regarding the use of this medicine in children. While this drug may be prescribed for children as young as 13 years for selected conditions, precautions do apply. Overdosage: If you think you have taken too much of this medicine contact a poison control center or emergency room at once. NOTE: This medicine is only for you. Do not share this medicine with others. What if I miss a dose? It is important not to miss your dose. Call your doctor or health care professional if you are unable to keep an appointment. What may interact with this  medicine? Do not take this medicine with any of the following medications: -other medicines containing denosumab This medicine may also interact with the following medications: -medicines that lower your chance of fighting infection -steroid medicines like prednisone or cortisone This list may not describe all possible interactions. Give your health care provider a list of all the medicines, herbs, non-prescription drugs, or dietary supplements you use. Also tell them if you smoke, drink alcohol, or use illegal drugs. Some items may interact with your medicine. What should I watch for while using this medicine? Visit your doctor or health care professional for regular checks on your progress. Your doctor or health care professional may order blood tests and other tests to see how you are doing. Call your doctor or health care professional for advice if you get a fever, chills or sore throat, or other symptoms of a cold or flu. Do not treat yourself. This drug may decrease your body's ability to fight infection. Try to avoid being around people who are sick. You should make sure you get enough calcium and vitamin D while you are taking this medicine, unless your doctor tells you not to. Discuss the foods you eat and the vitamins you take with your health care professional. See your dentist regularly. Brush and floss your teeth as directed. Before you have any dental work done, tell your dentist you are receiving this medicine. Do  not become pregnant while taking this medicine or for 5 months after stopping it. Talk with your doctor or health care professional about your birth control options while taking this medicine. Women should inform their doctor if they wish to become pregnant or think they might be pregnant. There is a potential for serious side effects to an unborn child. Talk to your health care professional or pharmacist for more information. What side effects may I notice from receiving this  medicine? Side effects that you should report to your doctor or health care professional as soon as possible: -allergic reactions like skin rash, itching or hives, swelling of the face, lips, or tongue -bone pain -breathing problems -dizziness -jaw pain, especially after dental work -redness, blistering, peeling of the skin -signs and symptoms of infection like fever or chills; cough; sore throat; pain or trouble passing urine -signs of low calcium like fast heartbeat, muscle cramps or muscle pain; pain, tingling, numbness in the hands or feet; seizures -unusual bleeding or bruising -unusually weak or tired Side effects that usually do not require medical attention (report to your doctor or health care professional if they continue or are bothersome): -constipation -diarrhea -headache -joint pain -loss of appetite -muscle pain -runny nose -tiredness -upset stomach This list may not describe all possible side effects. Call your doctor for medical advice about side effects. You may report side effects to FDA at 1-800-FDA-1088. Where should I keep my medicine? This medicine is only given in a clinic, doctor's office, or other health care setting and will not be stored at home. NOTE: This sheet is a summary. It may not cover all possible information. If you have questions about this medicine, talk to your doctor, pharmacist, or health care provider.  2018 [employer]vier/Gold Standard (2016-02-04 19:17:21)  

## 2017-08-10 NOTE — Telephone Encounter (Signed)
Gave patient avs and calendar of upcoming appts.  °

## 2017-08-19 ENCOUNTER — Ambulatory Visit (INDEPENDENT_AMBULATORY_CARE_PROVIDER_SITE_OTHER): Payer: BLUE CROSS/BLUE SHIELD | Admitting: Psychiatry

## 2017-08-19 DIAGNOSIS — F4323 Adjustment disorder with mixed anxiety and depressed mood: Secondary | ICD-10-CM | POA: Diagnosis not present

## 2017-08-24 ENCOUNTER — Ambulatory Visit (INDEPENDENT_AMBULATORY_CARE_PROVIDER_SITE_OTHER): Payer: BLUE CROSS/BLUE SHIELD | Admitting: Psychiatry

## 2017-08-24 DIAGNOSIS — F4323 Adjustment disorder with mixed anxiety and depressed mood: Secondary | ICD-10-CM | POA: Diagnosis not present

## 2017-09-02 ENCOUNTER — Other Ambulatory Visit: Payer: Self-pay | Admitting: Oncology

## 2017-09-02 DIAGNOSIS — C50411 Malignant neoplasm of upper-outer quadrant of right female breast: Secondary | ICD-10-CM

## 2017-09-07 ENCOUNTER — Inpatient Hospital Stay: Payer: BLUE CROSS/BLUE SHIELD | Attending: Oncology

## 2017-09-07 ENCOUNTER — Inpatient Hospital Stay: Payer: BLUE CROSS/BLUE SHIELD

## 2017-09-07 ENCOUNTER — Ambulatory Visit (INDEPENDENT_AMBULATORY_CARE_PROVIDER_SITE_OTHER): Payer: BLUE CROSS/BLUE SHIELD | Admitting: Psychiatry

## 2017-09-07 VITALS — BP 113/59 | HR 70 | Temp 98.3°F | Resp 20

## 2017-09-07 DIAGNOSIS — F4323 Adjustment disorder with mixed anxiety and depressed mood: Secondary | ICD-10-CM | POA: Diagnosis not present

## 2017-09-07 DIAGNOSIS — C50411 Malignant neoplasm of upper-outer quadrant of right female breast: Secondary | ICD-10-CM

## 2017-09-07 DIAGNOSIS — C50919 Malignant neoplasm of unspecified site of unspecified female breast: Secondary | ICD-10-CM

## 2017-09-07 DIAGNOSIS — Z5111 Encounter for antineoplastic chemotherapy: Secondary | ICD-10-CM | POA: Insufficient documentation

## 2017-09-07 DIAGNOSIS — Z170 Estrogen receptor positive status [ER+]: Secondary | ICD-10-CM | POA: Insufficient documentation

## 2017-09-07 DIAGNOSIS — C7800 Secondary malignant neoplasm of unspecified lung: Secondary | ICD-10-CM

## 2017-09-07 LAB — COMPREHENSIVE METABOLIC PANEL
ALBUMIN: 3.8 g/dL (ref 3.5–5.0)
ALK PHOS: 60 U/L (ref 38–126)
ALT: 23 U/L (ref 0–44)
AST: 14 U/L — ABNORMAL LOW (ref 15–41)
Anion gap: 13 (ref 5–15)
BILIRUBIN TOTAL: 0.3 mg/dL (ref 0.3–1.2)
BUN: 15 mg/dL (ref 6–20)
CALCIUM: 8.8 mg/dL — AB (ref 8.9–10.3)
CO2: 19 mmol/L — ABNORMAL LOW (ref 22–32)
CREATININE: 1.16 mg/dL — AB (ref 0.44–1.00)
Chloride: 108 mmol/L (ref 98–111)
GFR calc non Af Amer: 51 mL/min — ABNORMAL LOW (ref >60)
GFR, EST AFRICAN AMERICAN: 59 mL/min — AB (ref >60)
GLUCOSE: 163 mg/dL — AB (ref 70–99)
Potassium: 4 mmol/L (ref 3.5–5.1)
Sodium: 140 mmol/L (ref 135–145)
TOTAL PROTEIN: 6.8 g/dL (ref 6.5–8.1)

## 2017-09-07 LAB — CBC WITH DIFFERENTIAL/PLATELET
BASOS ABS: 0 10*3/uL (ref 0.0–0.1)
BASOS PCT: 0 %
EOS ABS: 0 10*3/uL (ref 0.0–0.5)
Eosinophils Relative: 1 %
HCT: 37.6 % (ref 34.8–46.6)
Hemoglobin: 13 g/dL (ref 11.6–15.9)
Lymphocytes Relative: 36 %
Lymphs Abs: 1.5 10*3/uL (ref 0.9–3.3)
MCH: 34.9 pg — ABNORMAL HIGH (ref 25.1–34.0)
MCHC: 34.6 g/dL (ref 31.5–36.0)
MCV: 100.8 fL (ref 79.5–101.0)
MONO ABS: 0.1 10*3/uL (ref 0.1–0.9)
MONOS PCT: 3 %
Neutro Abs: 2.5 10*3/uL (ref 1.5–6.5)
Neutrophils Relative %: 60 %
PLATELETS: 252 10*3/uL (ref 145–400)
RBC: 3.73 MIL/uL (ref 3.70–5.45)
RDW: 14.4 % (ref 11.2–14.5)
WBC: 4.1 10*3/uL (ref 3.9–10.3)

## 2017-09-07 MED ORDER — FULVESTRANT 250 MG/5ML IM SOLN
500.0000 mg | Freq: Once | INTRAMUSCULAR | Status: AC
  Administered 2017-09-07: 500 mg via INTRAMUSCULAR

## 2017-09-07 MED ORDER — FULVESTRANT 250 MG/5ML IM SOLN
INTRAMUSCULAR | Status: AC
  Filled 2017-09-07: qty 5

## 2017-09-07 NOTE — Patient Instructions (Signed)

## 2017-09-14 ENCOUNTER — Ambulatory Visit (INDEPENDENT_AMBULATORY_CARE_PROVIDER_SITE_OTHER): Payer: BLUE CROSS/BLUE SHIELD | Admitting: Psychiatry

## 2017-09-14 DIAGNOSIS — F4320 Adjustment disorder, unspecified: Secondary | ICD-10-CM

## 2017-09-23 ENCOUNTER — Ambulatory Visit (INDEPENDENT_AMBULATORY_CARE_PROVIDER_SITE_OTHER): Payer: BLUE CROSS/BLUE SHIELD | Admitting: Psychiatry

## 2017-09-23 DIAGNOSIS — F4323 Adjustment disorder with mixed anxiety and depressed mood: Secondary | ICD-10-CM | POA: Diagnosis not present

## 2017-10-01 ENCOUNTER — Telehealth: Payer: Self-pay | Admitting: *Deleted

## 2017-10-01 MED ORDER — LEVOCETIRIZINE DIHYDROCHLORIDE 5 MG PO TABS: 5.0000 mg | ORAL_TABLET | Freq: Every evening | ORAL | 1 refills | Status: DC

## 2017-10-01 MED ORDER — METHYLPREDNISOLONE 4 MG PO TBPK: ORAL_TABLET | ORAL | 0 refills | Status: AC

## 2017-10-01 NOTE — Telephone Encounter (Signed)
This RN spoke with per her call stating she has developed " a whole body reaction to poison ivy " and inquiry if she should start her [name] today as planned.  She is concerned due to Ibrance affecting her immune system while she is having issue with poison ivy.  This  RN discussed above - including current treatment she is using for the poison ivy.   Cheryl Moore states she does not have a primary MD at present - and is using OTC creams and benadryl.  Per discussion- this RN advised pt to hold restart of the Ibrance at this time. Offered for her to come in to this office to be seen by our [facility] - which pt presently declines.  This RN did review above with MD who gave prescription for 21 tab medrol taper dose pak.  This RN called pt's pharmacy per above as well as to request if xyzal available in generic form for cost issues. Not xyzal is available in generic but generic not available OTC- order given for generic form.  [name] aware of above as well as to call next week if symptoms are not improving.

## 2017-10-05 ENCOUNTER — Inpatient Hospital Stay: Payer: BLUE CROSS/BLUE SHIELD | Attending: Oncology

## 2017-10-05 ENCOUNTER — Inpatient Hospital Stay: Payer: BLUE CROSS/BLUE SHIELD

## 2017-10-05 VITALS — BP 107/83 | HR 66 | Temp 98.1°F | Resp 18

## 2017-10-05 DIAGNOSIS — Z923 Personal history of irradiation: Secondary | ICD-10-CM | POA: Insufficient documentation

## 2017-10-05 DIAGNOSIS — Z79818 Long term (current) use of other agents affecting estrogen receptors and estrogen levels: Secondary | ICD-10-CM | POA: Diagnosis not present

## 2017-10-05 DIAGNOSIS — Z170 Estrogen receptor positive status [ER+]: Secondary | ICD-10-CM | POA: Diagnosis not present

## 2017-10-05 DIAGNOSIS — Z9221 Personal history of antineoplastic chemotherapy: Secondary | ICD-10-CM | POA: Insufficient documentation

## 2017-10-05 DIAGNOSIS — C50411 Malignant neoplasm of upper-outer quadrant of right female breast: Secondary | ICD-10-CM

## 2017-10-05 DIAGNOSIS — C7800 Secondary malignant neoplasm of unspecified lung: Secondary | ICD-10-CM

## 2017-10-05 DIAGNOSIS — Z9013 Acquired absence of bilateral breasts and nipples: Secondary | ICD-10-CM | POA: Diagnosis not present

## 2017-10-05 DIAGNOSIS — C50919 Malignant neoplasm of unspecified site of unspecified female breast: Secondary | ICD-10-CM

## 2017-10-05 LAB — CBC WITH DIFFERENTIAL/PLATELET
BASOS ABS: 0.1 10*3/uL (ref 0.0–0.1)
Basophils Relative: 1 %
Eosinophils Absolute: 0.1 10*3/uL (ref 0.0–0.5)
Eosinophils Relative: 1 %
HCT: 39.2 % (ref 34.8–46.6)
Hemoglobin: 13.5 g/dL (ref 11.6–15.9)
LYMPHS PCT: 35 %
Lymphs Abs: 2.3 10*3/uL (ref 0.9–3.3)
MCH: 35.2 pg — AB (ref 25.1–34.0)
MCHC: 34.4 g/dL (ref 31.5–36.0)
MCV: 102.3 fL — AB (ref 79.5–101.0)
MONO ABS: 0.7 10*3/uL (ref 0.1–0.9)
MONOS PCT: 10 %
NEUTROS ABS: 3.5 10*3/uL (ref 1.5–6.5)
Neutrophils Relative %: 53 %
PLATELETS: 260 10*3/uL (ref 145–400)
RBC: 3.83 MIL/uL (ref 3.70–5.45)
RDW: 14.5 % (ref 11.2–14.5)
WBC: 6.6 10*3/uL (ref 3.9–10.3)
nRBC: 1 /100 WBC — ABNORMAL HIGH

## 2017-10-05 LAB — COMPREHENSIVE METABOLIC PANEL WITH GFR
ALT: 18 U/L (ref 0–44)
AST: 13 U/L — ABNORMAL LOW (ref 15–41)
Albumin: 4.1 g/dL (ref 3.5–5.0)
Alkaline Phosphatase: 55 U/L (ref 38–126)
Anion gap: 12 (ref 5–15)
BUN: 20 mg/dL (ref 6–20)
CO2: 21 mmol/L — ABNORMAL LOW (ref 22–32)
Calcium: 9.8 mg/dL (ref 8.9–10.3)
Chloride: 108 mmol/L (ref 98–111)
Creatinine, Ser: 1.13 mg/dL — ABNORMAL HIGH (ref 0.44–1.00)
GFR calc Af Amer: >60 mL/min
GFR calc non Af Amer: 53 mL/min — ABNORMAL LOW
Glucose, Bld: 104 mg/dL — ABNORMAL HIGH (ref 70–99)
Potassium: 4.2 mmol/L (ref 3.5–5.1)
Sodium: 141 mmol/L (ref 135–145)
Total Bilirubin: 0.3 mg/dL (ref 0.3–1.2)
Total Protein: 7.3 g/dL (ref 6.5–8.1)

## 2017-10-05 MED ORDER — FULVESTRANT 250 MG/5ML IM SOLN
500.0000 mg | Freq: Once | INTRAMUSCULAR | Status: AC
  Administered 2017-10-05: 500 mg via INTRAMUSCULAR

## 2017-10-05 MED ORDER — DENOSUMAB 120 MG/1.7ML ~~LOC~~ SOLN
120.0000 mg | Freq: Once | SUBCUTANEOUS | Status: AC
  Administered 2017-10-05: 120 mg via SUBCUTANEOUS

## 2017-10-06 ENCOUNTER — Ambulatory Visit (INDEPENDENT_AMBULATORY_CARE_PROVIDER_SITE_OTHER): Payer: BLUE CROSS/BLUE SHIELD | Admitting: Psychiatry

## 2017-10-06 DIAGNOSIS — F4323 Adjustment disorder with mixed anxiety and depressed mood: Secondary | ICD-10-CM | POA: Diagnosis not present

## 2017-10-07 ENCOUNTER — Other Ambulatory Visit: Payer: Self-pay | Admitting: Oncology

## 2017-10-07 DIAGNOSIS — C50411 Malignant neoplasm of upper-outer quadrant of right female breast: Secondary | ICD-10-CM

## 2017-10-09 ENCOUNTER — Other Ambulatory Visit: Payer: Self-pay | Admitting: Oncology

## 2017-10-13 ENCOUNTER — Ambulatory Visit (INDEPENDENT_AMBULATORY_CARE_PROVIDER_SITE_OTHER): Payer: BLUE CROSS/BLUE SHIELD | Admitting: Psychiatry

## 2017-10-13 DIAGNOSIS — F4323 Adjustment disorder with mixed anxiety and depressed mood: Secondary | ICD-10-CM | POA: Diagnosis not present

## 2017-10-21 ENCOUNTER — Ambulatory Visit (INDEPENDENT_AMBULATORY_CARE_PROVIDER_SITE_OTHER): Payer: BLUE CROSS/BLUE SHIELD | Admitting: Psychiatry

## 2017-10-21 DIAGNOSIS — F4322 Adjustment disorder with anxiety: Secondary | ICD-10-CM

## 2017-10-25 ENCOUNTER — Ambulatory Visit (HOSPITAL_COMMUNITY)
Admission: RE | Admit: 2017-10-25 | Discharge: 2017-10-25 | Disposition: A | Discharge status: Home / Self Care | Payer: BLUE CROSS/BLUE SHIELD | Source: Ambulatory Visit | Attending: Oncology | Admitting: Oncology

## 2017-10-25 DIAGNOSIS — M899 Disorder of bone, unspecified: Secondary | ICD-10-CM | POA: Diagnosis not present

## 2017-10-25 DIAGNOSIS — Z170 Estrogen receptor positive status [ER+]: Secondary | ICD-10-CM | POA: Diagnosis present

## 2017-10-25 DIAGNOSIS — C7800 Secondary malignant neoplasm of unspecified lung: Secondary | ICD-10-CM

## 2017-10-25 DIAGNOSIS — C50411 Malignant neoplasm of upper-outer quadrant of right female breast: Secondary | ICD-10-CM | POA: Diagnosis present

## 2017-10-25 DIAGNOSIS — C50919 Malignant neoplasm of unspecified site of unspecified female breast: Secondary | ICD-10-CM

## 2017-10-25 MED ORDER — IOHEXOL 300 MG/ML  SOLN
75.0000 mL | Freq: Once | INTRAMUSCULAR | Status: AC | PRN
  Administered 2017-10-25: 75 mL via INTRAVENOUS

## 2017-10-25 MED ORDER — SODIUM CHLORIDE 0.9 % IJ SOLN
INTRAMUSCULAR | Status: AC
  Filled 2017-10-25: qty 50

## 2017-10-28 ENCOUNTER — Ambulatory Visit (INDEPENDENT_AMBULATORY_CARE_PROVIDER_SITE_OTHER): Payer: BLUE CROSS/BLUE SHIELD | Admitting: Psychiatry

## 2017-10-28 DIAGNOSIS — F4323 Adjustment disorder with mixed anxiety and depressed mood: Secondary | ICD-10-CM | POA: Diagnosis not present
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
## 2017-11-02 ENCOUNTER — Inpatient Hospital Stay: Payer: BLUE CROSS/BLUE SHIELD

## 2017-11-02 ENCOUNTER — Telehealth: Payer: Self-pay | Admitting: Oncology

## 2017-11-02 ENCOUNTER — Inpatient Hospital Stay (HOSPITAL_BASED_OUTPATIENT_CLINIC_OR_DEPARTMENT_OTHER): Payer: BLUE CROSS/BLUE SHIELD | Admitting: Oncology

## 2017-11-02 ENCOUNTER — Inpatient Hospital Stay: Payer: BLUE CROSS/BLUE SHIELD | Attending: Oncology

## 2017-11-02 VITALS — BP 113/51 | HR 73 | Temp 97.8°F | Resp 20 | Ht 62.75 in | Wt 209.1 lb

## 2017-11-02 DIAGNOSIS — Z5111 Encounter for antineoplastic chemotherapy: Secondary | ICD-10-CM | POA: Diagnosis present

## 2017-11-02 DIAGNOSIS — Z170 Estrogen receptor positive status [ER+]: Secondary | ICD-10-CM | POA: Diagnosis not present

## 2017-11-02 DIAGNOSIS — Z923 Personal history of irradiation: Secondary | ICD-10-CM | POA: Diagnosis not present

## 2017-11-02 DIAGNOSIS — C50211 Malignant neoplasm of upper-inner quadrant of right female breast: Secondary | ICD-10-CM | POA: Diagnosis present

## 2017-11-02 DIAGNOSIS — Z9221 Personal history of antineoplastic chemotherapy: Secondary | ICD-10-CM | POA: Insufficient documentation

## 2017-11-02 DIAGNOSIS — C773 Secondary and unspecified malignant neoplasm of axilla and upper limb lymph nodes: Secondary | ICD-10-CM | POA: Diagnosis not present

## 2017-11-02 DIAGNOSIS — C50411 Malignant neoplasm of upper-outer quadrant of right female breast: Secondary | ICD-10-CM

## 2017-11-02 DIAGNOSIS — Z79899 Other long term (current) drug therapy: Secondary | ICD-10-CM | POA: Insufficient documentation

## 2017-11-02 DIAGNOSIS — C7800 Secondary malignant neoplasm of unspecified lung: Secondary | ICD-10-CM

## 2017-11-02 DIAGNOSIS — Z853 Personal history of malignant neoplasm of breast: Secondary | ICD-10-CM

## 2017-11-02 DIAGNOSIS — Z9013 Acquired absence of bilateral breasts and nipples: Secondary | ICD-10-CM

## 2017-11-02 DIAGNOSIS — C50919 Malignant neoplasm of unspecified site of unspecified female breast: Secondary | ICD-10-CM

## 2017-11-02 LAB — CBC WITH DIFFERENTIAL/PLATELET
ABS IMMATURE GRANULOCYTES: 0.01 10*3/uL (ref 0.00–0.07)
BASOS PCT: 1 %
Basophils Absolute: 0 10*3/uL (ref 0.0–0.1)
Eosinophils Absolute: 0.1 10*3/uL (ref 0.0–0.5)
Eosinophils Relative: 3 %
HEMATOCRIT: 37.1 % (ref 36.0–46.0)
Hemoglobin: 12.8 g/dL (ref 12.0–15.0)
IMMATURE GRANULOCYTES: 0 %
Lymphocytes Relative: 36 %
Lymphs Abs: 1.2 10*3/uL (ref 0.7–4.0)
MCH: 34.6 pg — ABNORMAL HIGH (ref 26.0–34.0)
MCHC: 34.5 g/dL (ref 30.0–36.0)
MCV: 100.3 fL — ABNORMAL HIGH (ref 80.0–100.0)
MONOS PCT: 8 %
Monocytes Absolute: 0.2 10*3/uL (ref 0.1–1.0)
NEUTROS ABS: 1.7 10*3/uL (ref 1.7–7.7)
NEUTROS PCT: 52 %
Platelets: 257 10*3/uL (ref 150–400)
RBC: 3.7 MIL/uL — AB (ref 3.87–5.11)
RDW: 13.9 % (ref 11.5–15.5)
WBC: 3.2 10*3/uL — ABNORMAL LOW (ref 4.0–10.5)
nRBC: 0 % (ref 0.0–0.2)

## 2017-11-02 LAB — COMPREHENSIVE METABOLIC PANEL
ALBUMIN: 3.7 g/dL (ref 3.5–5.0)
ALT: 17 U/L (ref 0–44)
ANION GAP: 9 (ref 5–15)
AST: 13 U/L — ABNORMAL LOW (ref 15–41)
Alkaline Phosphatase: 53 U/L (ref 38–126)
BILIRUBIN TOTAL: 0.3 mg/dL (ref 0.3–1.2)
BUN: 15 mg/dL (ref 6–20)
CO2: 23 mmol/L (ref 22–32)
Calcium: 9.3 mg/dL (ref 8.9–10.3)
Chloride: 110 mmol/L (ref 98–111)
Creatinine, Ser: 1.29 mg/dL — ABNORMAL HIGH (ref 0.44–1.00)
GFR calc Af Amer: 52 mL/min — ABNORMAL LOW (ref >60)
GFR calc non Af Amer: 45 mL/min — ABNORMAL LOW (ref >60)
GLUCOSE: 182 mg/dL — AB (ref 70–99)
POTASSIUM: 3.9 mmol/L (ref 3.5–5.1)
Sodium: 142 mmol/L (ref 135–145)
TOTAL PROTEIN: 6.9 g/dL (ref 6.5–8.1)

## 2017-11-02 MED ORDER — FULVESTRANT 250 MG/5ML IM SOLN
500.0000 mg | Freq: Once | INTRAMUSCULAR | Status: AC
  Administered 2017-11-02: 500 mg via INTRAMUSCULAR

## 2017-11-02 MED ORDER — ZALEPLON 5 MG PO CAPS: 5.0000 mg | ORAL_CAPSULE | Freq: Every evening | ORAL | 0 refills | Status: DC | PRN

## 2017-11-02 NOTE — Telephone Encounter (Signed)
Gave patient avs and calendar.   °

## 2017-11-11 ENCOUNTER — Ambulatory Visit (INDEPENDENT_AMBULATORY_CARE_PROVIDER_SITE_OTHER): Payer: BLUE CROSS/BLUE SHIELD | Admitting: Psychiatry

## 2017-11-11 DIAGNOSIS — F4323 Adjustment disorder with mixed anxiety and depressed mood: Secondary | ICD-10-CM

## 2017-11-12 ENCOUNTER — Other Ambulatory Visit: Payer: Self-pay | Admitting: Oncology

## 2017-11-12 DIAGNOSIS — C50411 Malignant neoplasm of upper-outer quadrant of right female breast: Secondary | ICD-10-CM

## 2017-11-25 ENCOUNTER — Ambulatory Visit (INDEPENDENT_AMBULATORY_CARE_PROVIDER_SITE_OTHER): Payer: BLUE CROSS/BLUE SHIELD | Admitting: Psychiatry

## 2017-11-25 DIAGNOSIS — F4323 Adjustment disorder with mixed anxiety and depressed mood: Secondary | ICD-10-CM

## 2017-11-30 ENCOUNTER — Inpatient Hospital Stay: Payer: BLUE CROSS/BLUE SHIELD

## 2017-11-30 ENCOUNTER — Inpatient Hospital Stay: Payer: BLUE CROSS/BLUE SHIELD | Attending: Oncology

## 2017-11-30 VITALS — BP 106/86 | HR 82 | Temp 98.1°F | Resp 18

## 2017-11-30 DIAGNOSIS — Z923 Personal history of irradiation: Secondary | ICD-10-CM | POA: Diagnosis not present

## 2017-11-30 DIAGNOSIS — C50919 Malignant neoplasm of unspecified site of unspecified female breast: Secondary | ICD-10-CM

## 2017-11-30 DIAGNOSIS — Z9013 Acquired absence of bilateral breasts and nipples: Secondary | ICD-10-CM | POA: Insufficient documentation

## 2017-11-30 DIAGNOSIS — C50211 Malignant neoplasm of upper-inner quadrant of right female breast: Secondary | ICD-10-CM | POA: Diagnosis present

## 2017-11-30 DIAGNOSIS — C50411 Malignant neoplasm of upper-outer quadrant of right female breast: Secondary | ICD-10-CM

## 2017-11-30 DIAGNOSIS — Z170 Estrogen receptor positive status [ER+]: Secondary | ICD-10-CM | POA: Diagnosis not present

## 2017-11-30 DIAGNOSIS — Z9221 Personal history of antineoplastic chemotherapy: Secondary | ICD-10-CM | POA: Diagnosis not present

## 2017-11-30 DIAGNOSIS — C773 Secondary and unspecified malignant neoplasm of axilla and upper limb lymph nodes: Secondary | ICD-10-CM | POA: Insufficient documentation

## 2017-11-30 DIAGNOSIS — Z79818 Long term (current) use of other agents affecting estrogen receptors and estrogen levels: Secondary | ICD-10-CM | POA: Diagnosis not present

## 2017-11-30 DIAGNOSIS — C7800 Secondary malignant neoplasm of unspecified lung: Secondary | ICD-10-CM

## 2017-11-30 LAB — COMPREHENSIVE METABOLIC PANEL
ALBUMIN: 4 g/dL (ref 3.5–5.0)
ALT: 17 U/L (ref 0–44)
AST: 15 U/L (ref 15–41)
Alkaline Phosphatase: 50 U/L (ref 38–126)
Anion gap: 9 (ref 5–15)
BUN: 12 mg/dL (ref 6–20)
CO2: 24 mmol/L (ref 22–32)
CREATININE: 1.22 mg/dL — AB (ref 0.44–1.00)
Calcium: 9.7 mg/dL (ref 8.9–10.3)
Chloride: 109 mmol/L (ref 98–111)
GFR calc Af Amer: 56 mL/min — ABNORMAL LOW (ref >60)
GFR calc non Af Amer: 48 mL/min — ABNORMAL LOW (ref >60)
GLUCOSE: 107 mg/dL — AB (ref 70–99)
POTASSIUM: 3.9 mmol/L (ref 3.5–5.1)
Sodium: 142 mmol/L (ref 135–145)
Total Bilirubin: 0.3 mg/dL (ref 0.3–1.2)
Total Protein: 7.1 g/dL (ref 6.5–8.1)

## 2017-11-30 LAB — CBC WITH DIFFERENTIAL/PLATELET
Abs Immature Granulocytes: 0.01 10*3/uL (ref 0.00–0.07)
BASOS ABS: 0 10*3/uL (ref 0.0–0.1)
Basophils Relative: 1 %
EOS ABS: 0 10*3/uL (ref 0.0–0.5)
Eosinophils Relative: 0 %
HEMATOCRIT: 37.7 % (ref 36.0–46.0)
HEMOGLOBIN: 13.2 g/dL (ref 12.0–15.0)
Immature Granulocytes: 0 %
LYMPHS ABS: 1.4 10*3/uL (ref 0.7–4.0)
LYMPHS PCT: 52 %
MCH: 35.5 pg — AB (ref 26.0–34.0)
MCHC: 35 g/dL (ref 30.0–36.0)
MCV: 101.3 fL — ABNORMAL HIGH (ref 80.0–100.0)
MONO ABS: 0.2 10*3/uL (ref 0.1–1.0)
Monocytes Relative: 8 %
NEUTROS ABS: 1.1 10*3/uL — AB (ref 1.7–7.7)
Neutrophils Relative %: 39 %
Platelets: 266 10*3/uL (ref 150–400)
RBC: 3.72 MIL/uL — AB (ref 3.87–5.11)
RDW: 14.1 % (ref 11.5–15.5)
WBC: 2.8 10*3/uL — ABNORMAL LOW (ref 4.0–10.5)
nRBC: 0 % (ref 0.0–0.2)

## 2017-11-30 MED ORDER — FULVESTRANT 250 MG/5ML IM SOLN
500.0000 mg | Freq: Once | INTRAMUSCULAR | Status: AC
  Administered 2017-11-30: 500 mg via INTRAMUSCULAR

## 2017-11-30 MED ORDER — FULVESTRANT 250 MG/5ML IM SOLN
INTRAMUSCULAR | Status: AC
  Filled 2017-11-30: qty 10

## 2017-12-06 ENCOUNTER — Other Ambulatory Visit: Payer: Self-pay | Admitting: Oncology

## 2017-12-09 ENCOUNTER — Ambulatory Visit (INDEPENDENT_AMBULATORY_CARE_PROVIDER_SITE_OTHER): Payer: BLUE CROSS/BLUE SHIELD | Admitting: Psychiatry

## 2017-12-09 DIAGNOSIS — F4323 Adjustment disorder with mixed anxiety and depressed mood: Secondary | ICD-10-CM

## 2017-12-14 ENCOUNTER — Other Ambulatory Visit: Payer: Self-pay | Admitting: Oncology

## 2017-12-14 DIAGNOSIS — C50411 Malignant neoplasm of upper-outer quadrant of right female breast: Secondary | ICD-10-CM

## 2017-12-16 MED ORDER — PALONOSETRON HCL INJECTION 0.25 MG/5ML
INTRAVENOUS | Status: AC
  Filled 2017-12-16: qty 5

## 2017-12-21 ENCOUNTER — Ambulatory Visit (INDEPENDENT_AMBULATORY_CARE_PROVIDER_SITE_OTHER): Payer: BLUE CROSS/BLUE SHIELD | Admitting: Psychiatry

## 2017-12-21 DIAGNOSIS — F4323 Adjustment disorder with mixed anxiety and depressed mood: Secondary | ICD-10-CM | POA: Diagnosis not present

## 2017-12-28 ENCOUNTER — Inpatient Hospital Stay: Payer: BLUE CROSS/BLUE SHIELD

## 2017-12-28 ENCOUNTER — Inpatient Hospital Stay: Payer: BLUE CROSS/BLUE SHIELD | Attending: Oncology

## 2017-12-28 DIAGNOSIS — C773 Secondary and unspecified malignant neoplasm of axilla and upper limb lymph nodes: Secondary | ICD-10-CM | POA: Diagnosis not present

## 2017-12-28 DIAGNOSIS — C50211 Malignant neoplasm of upper-inner quadrant of right female breast: Secondary | ICD-10-CM | POA: Diagnosis not present

## 2017-12-28 DIAGNOSIS — C7800 Secondary malignant neoplasm of unspecified lung: Secondary | ICD-10-CM

## 2017-12-28 DIAGNOSIS — C50411 Malignant neoplasm of upper-outer quadrant of right female breast: Secondary | ICD-10-CM

## 2017-12-28 DIAGNOSIS — Z9013 Acquired absence of bilateral breasts and nipples: Secondary | ICD-10-CM | POA: Diagnosis not present

## 2017-12-28 DIAGNOSIS — Z170 Estrogen receptor positive status [ER+]: Secondary | ICD-10-CM

## 2017-12-28 DIAGNOSIS — Z9221 Personal history of antineoplastic chemotherapy: Secondary | ICD-10-CM | POA: Insufficient documentation

## 2017-12-28 DIAGNOSIS — Z923 Personal history of irradiation: Secondary | ICD-10-CM | POA: Insufficient documentation

## 2017-12-28 DIAGNOSIS — Z79811 Long term (current) use of aromatase inhibitors: Secondary | ICD-10-CM | POA: Diagnosis not present

## 2017-12-28 DIAGNOSIS — C50919 Malignant neoplasm of unspecified site of unspecified female breast: Secondary | ICD-10-CM

## 2017-12-28 LAB — CBC WITH DIFFERENTIAL/PLATELET
ABS IMMATURE GRANULOCYTES: 0.04 10*3/uL (ref 0.00–0.07)
BASOS PCT: 1 %
Basophils Absolute: 0.1 10*3/uL (ref 0.0–0.1)
Eosinophils Absolute: 0.1 10*3/uL (ref 0.0–0.5)
Eosinophils Relative: 1 %
HCT: 39.5 % (ref 36.0–46.0)
Hemoglobin: 13.6 g/dL (ref 12.0–15.0)
Immature Granulocytes: 1 %
Lymphocytes Relative: 39 %
Lymphs Abs: 2.1 10*3/uL (ref 0.7–4.0)
MCH: 34.8 pg — AB (ref 26.0–34.0)
MCHC: 34.4 g/dL (ref 30.0–36.0)
MCV: 101 fL — ABNORMAL HIGH (ref 80.0–100.0)
MONO ABS: 0.4 10*3/uL (ref 0.1–1.0)
Monocytes Relative: 8 %
NEUTROS ABS: 2.7 10*3/uL (ref 1.7–7.7)
NRBC: 0 % (ref 0.0–0.2)
Neutrophils Relative %: 50 %
Platelets: 316 10*3/uL (ref 150–400)
RBC: 3.91 MIL/uL (ref 3.87–5.11)
RDW: 14.4 % (ref 11.5–15.5)
WBC: 5.4 10*3/uL (ref 4.0–10.5)

## 2017-12-28 LAB — COMPREHENSIVE METABOLIC PANEL
ALT: 26 U/L (ref 0–44)
AST: 18 U/L (ref 15–41)
Albumin: 4 g/dL (ref 3.5–5.0)
Alkaline Phosphatase: 54 U/L (ref 38–126)
Anion gap: 14 (ref 5–15)
BILIRUBIN TOTAL: 0.4 mg/dL (ref 0.3–1.2)
BUN: 21 mg/dL — AB (ref 6–20)
CO2: 22 mmol/L (ref 22–32)
Calcium: 9.4 mg/dL (ref 8.9–10.3)
Chloride: 107 mmol/L (ref 98–111)
Creatinine, Ser: 1.32 mg/dL — ABNORMAL HIGH (ref 0.44–1.00)
GFR calc Af Amer: 52 mL/min — ABNORMAL LOW (ref >60)
GFR, EST NON AFRICAN AMERICAN: 45 mL/min — AB (ref >60)
Glucose, Bld: 155 mg/dL — ABNORMAL HIGH (ref 70–99)
POTASSIUM: 3.6 mmol/L (ref 3.5–5.1)
Sodium: 143 mmol/L (ref 135–145)
TOTAL PROTEIN: 7.2 g/dL (ref 6.5–8.1)

## 2017-12-28 MED ORDER — DENOSUMAB 120 MG/1.7ML ~~LOC~~ SOLN
120.0000 mg | Freq: Once | SUBCUTANEOUS | Status: AC
  Administered 2017-12-28: 120 mg via SUBCUTANEOUS

## 2017-12-28 MED ORDER — FULVESTRANT 250 MG/5ML IM SOLN
500.0000 mg | Freq: Once | INTRAMUSCULAR | Status: AC
  Administered 2017-12-28: 500 mg via INTRAMUSCULAR

## 2017-12-28 NOTE — Patient Instructions (Signed)
Fulvestrant injection What is this medicine? FULVESTRANT (ful VES trant) blocks the effects of estrogen. It is used to treat breast cancer. This medicine may be used for other purposes; ask your health care provider or pharmacist if you have questions. COMMON BRAND NAME(S): FASLODEX What should I tell my health care provider before I take this medicine? They need to know if you have any of these conditions: -bleeding problems -liver disease -low levels of platelets in the blood -an unusual or allergic reaction to fulvestrant, other medicines, foods, dyes, or preservatives -pregnant or trying to get pregnant -breast-feeding How should I use this medicine? This medicine is for injection into a muscle. It is usually given by a health care professional in a hospital or clinic setting. Talk to your pediatrician regarding the use of this medicine in children. Special care may be needed. Overdosage: If you think you have taken too much of this medicine contact a poison control center or emergency room at once. NOTE: This medicine is only for you. Do not share this medicine with others. What if I miss a dose? It is important not to miss your dose. Call your doctor or health care professional if you are unable to keep an appointment. What may interact with this medicine? -medicines that treat or prevent blood clots like warfarin, enoxaparin, and dalteparin This list may not describe all possible interactions. Give your health care provider a list of all the medicines, herbs, non-prescription drugs, or dietary supplements you use. Also tell them if you smoke, drink alcohol, or use illegal drugs. Some items may interact with your medicine. What should I watch for while using this medicine? Your condition will be monitored carefully while you are receiving this medicine. You will need important blood work done while you are taking this medicine. Do not become pregnant while taking this medicine or for  at least 1 year after stopping it. Women of child-bearing potential will need to have a negative pregnancy test before starting this medicine. Women should inform their doctor if they wish to become pregnant or think they might be pregnant. There is a potential for serious side effects to an unborn child. Men should inform their doctors if they wish to father a child. This medicine may lower sperm counts. Talk to your health care professional or pharmacist for more information. Do not breast-feed an infant while taking this medicine or for 1 year after the last dose. What side effects may I notice from receiving this medicine? Side effects that you should report to your doctor or health care professional as soon as possible: -allergic reactions like skin rash, itching or hives, swelling of the face, lips, or tongue -feeling faint or lightheaded, falls -pain, tingling, numbness, or weakness in the legs -signs and symptoms of infection like fever or chills; cough; flu-like symptoms; sore throat -vaginal bleeding Side effects that usually do not require medical attention (report to your doctor or health care professional if they continue or are bothersome): -aches, pains -constipation -diarrhea -headache -hot flashes -nausea, vomiting -pain at site where injected -stomach pain This list may not describe all possible side effects. Call your doctor for medical advice about side effects. You may report side effects to FDA at 1-800-FDA-1088. Where should I keep my medicine? This drug is given in a hospital or clinic and will not be stored at home. NOTE: This sheet is a summary. It may not cover all possible information. If you have questions about this medicine, talk to your   doctor, pharmacist, or health care provider.  2018 [employer] Standard (2014-08-10 11:03:55) Denosumab injection What is this medicine? DENOSUMAB (den oh sue mab) slows bone breakdown. Prolia is used to treat osteoporosis in  women after menopause and in men. Delton See is used to treat a high calcium level due to cancer and to prevent bone fractures and other bone problems caused by multiple myeloma or cancer bone metastases. Delton See is also used to treat giant cell tumor of the bone. This medicine may be used for other purposes; ask your health care provider or pharmacist if you have questions. COMMON BRAND NAME(S): Prolia, XGEVA What should I tell my health care provider before I take this medicine? They need to know if you have any of these conditions: -dental disease -having surgery or tooth extraction -infection -kidney disease -low levels of calcium or Vitamin D in the blood -malnutrition -on hemodialysis -skin conditions or sensitivity -thyroid or parathyroid disease -an unusual reaction to denosumab, other medicines, foods, dyes, or preservatives -pregnant or trying to get pregnant -breast-feeding How should I use this medicine? This medicine is for injection under the skin. It is given by a health care professional in a hospital or clinic setting. If you are getting Prolia, a special MedGuide will be given to you by the pharmacist with each prescription and refill. Be sure to read this information carefully each time. For Prolia, talk to your pediatrician regarding the use of this medicine in children. Special care may be needed. For Delton See, talk to your pediatrician regarding the use of this medicine in children. While this drug may be prescribed for children as young as 13 years for selected conditions, precautions do apply. Overdosage: If you think you have taken too much of this medicine contact a poison control center or emergency room at once. NOTE: This medicine is only for you. Do not share this medicine with others. What if I miss a dose? It is important not to miss your dose. Call your doctor or health care professional if you are unable to keep an appointment. What may interact with this  medicine? Do not take this medicine with any of the following medications: -other medicines containing denosumab This medicine may also interact with the following medications: -medicines that lower your chance of fighting infection -steroid medicines like prednisone or cortisone This list may not describe all possible interactions. Give your health care provider a list of all the medicines, herbs, non-prescription drugs, or dietary supplements you use. Also tell them if you smoke, drink alcohol, or use illegal drugs. Some items may interact with your medicine. What should I watch for while using this medicine? Visit your doctor or health care professional for regular checks on your progress. Your doctor or health care professional may order blood tests and other tests to see how you are doing. Call your doctor or health care professional for advice if you get a fever, chills or sore throat, or other symptoms of a cold or flu. Do not treat yourself. This drug may decrease your body's ability to fight infection. Try to avoid being around people who are sick. You should make sure you get enough calcium and vitamin D while you are taking this medicine, unless your doctor tells you not to. Discuss the foods you eat and the vitamins you take with your health care professional. See your dentist regularly. Brush and floss your teeth as directed. Before you have any dental work done, tell your dentist you are receiving this medicine. Do  not become pregnant while taking this medicine or for 5 months after stopping it. Talk with your doctor or health care professional about your birth control options while taking this medicine. Women should inform their doctor if they wish to become pregnant or think they might be pregnant. There is a potential for serious side effects to an unborn child. Talk to your health care professional or pharmacist for more information. What side effects may I notice from receiving this  medicine? Side effects that you should report to your doctor or health care professional as soon as possible: -allergic reactions like skin rash, itching or hives, swelling of the face, lips, or tongue -bone pain -breathing problems -dizziness -jaw pain, especially after dental work -redness, blistering, peeling of the skin -signs and symptoms of infection like fever or chills; cough; sore throat; pain or trouble passing urine -signs of low calcium like fast heartbeat, muscle cramps or muscle pain; pain, tingling, numbness in the hands or feet; seizures -unusual bleeding or bruising -unusually weak or tired Side effects that usually do not require medical attention (report to your doctor or health care professional if they continue or are bothersome): -constipation -diarrhea -headache -joint pain -loss of appetite -muscle pain -runny nose -tiredness -upset stomach This list may not describe all possible side effects. Call your doctor for medical advice about side effects. You may report side effects to FDA at 1-800-FDA-1088. Where should I keep my medicine? This medicine is only given in a clinic, doctor's office, or other health care setting and will not be stored at home. NOTE: This sheet is a summary. It may not cover all possible information. If you have questions about this medicine, talk to your doctor, pharmacist, or health care provider.  2018 [employer] Standard (2016-02-04 19:17:21)

## 2018-01-08 ENCOUNTER — Other Ambulatory Visit: Payer: Self-pay | Admitting: Oncology

## 2018-01-10 ENCOUNTER — Other Ambulatory Visit: Payer: Self-pay | Admitting: *Deleted

## 2018-01-11 ENCOUNTER — Ambulatory Visit (INDEPENDENT_AMBULATORY_CARE_PROVIDER_SITE_OTHER): Payer: BLUE CROSS/BLUE SHIELD | Admitting: Psychiatry

## 2018-01-11 DIAGNOSIS — F4323 Adjustment disorder with mixed anxiety and depressed mood: Secondary | ICD-10-CM

## 2018-01-14 ENCOUNTER — Other Ambulatory Visit: Payer: Self-pay | Admitting: Oncology

## 2018-01-15 ENCOUNTER — Other Ambulatory Visit: Payer: Self-pay | Admitting: Oncology

## 2018-01-15 DIAGNOSIS — C50411 Malignant neoplasm of upper-outer quadrant of right female breast: Secondary | ICD-10-CM

## 2018-01-25 ENCOUNTER — Inpatient Hospital Stay: Payer: BLUE CROSS/BLUE SHIELD

## 2018-01-25 VITALS — BP 119/72 | HR 73 | Temp 98.7°F | Resp 18

## 2018-01-25 DIAGNOSIS — C50411 Malignant neoplasm of upper-outer quadrant of right female breast: Secondary | ICD-10-CM

## 2018-01-25 DIAGNOSIS — C50919 Malignant neoplasm of unspecified site of unspecified female breast: Secondary | ICD-10-CM

## 2018-01-25 DIAGNOSIS — C50211 Malignant neoplasm of upper-inner quadrant of right female breast: Secondary | ICD-10-CM | POA: Diagnosis not present

## 2018-01-25 DIAGNOSIS — Z170 Estrogen receptor positive status [ER+]: Secondary | ICD-10-CM

## 2018-01-25 DIAGNOSIS — C7800 Secondary malignant neoplasm of unspecified lung: Secondary | ICD-10-CM

## 2018-01-25 LAB — COMPREHENSIVE METABOLIC PANEL
ALK PHOS: 49 U/L (ref 38–126)
ALT: 22 U/L (ref 0–44)
ANION GAP: 9 (ref 5–15)
AST: 16 U/L (ref 15–41)
Albumin: 3.8 g/dL (ref 3.5–5.0)
BUN: 16 mg/dL (ref 6–20)
CALCIUM: 9.5 mg/dL (ref 8.9–10.3)
CO2: 24 mmol/L (ref 22–32)
CREATININE: 1.15 mg/dL — AB (ref 0.44–1.00)
Chloride: 108 mmol/L (ref 98–111)
GFR calc Af Amer: >60 mL/min (ref >60)
GFR, EST NON AFRICAN AMERICAN: 53 mL/min — AB (ref >60)
Glucose, Bld: 136 mg/dL — ABNORMAL HIGH (ref 70–99)
Potassium: 4.1 mmol/L (ref 3.5–5.1)
SODIUM: 141 mmol/L (ref 135–145)
Total Bilirubin: 0.3 mg/dL (ref 0.3–1.2)
Total Protein: 7 g/dL (ref 6.5–8.1)

## 2018-01-25 LAB — CBC WITH DIFFERENTIAL/PLATELET
Abs Immature Granulocytes: 0.01 10*3/uL (ref 0.00–0.07)
Basophils Absolute: 0 10*3/uL (ref 0.0–0.1)
Basophils Relative: 1 %
EOS ABS: 0 10*3/uL (ref 0.0–0.5)
Eosinophils Relative: 1 %
HCT: 36.5 % (ref 36.0–46.0)
HEMOGLOBIN: 12.6 g/dL (ref 12.0–15.0)
IMMATURE GRANULOCYTES: 0 %
Lymphocytes Relative: 43 %
Lymphs Abs: 1.6 10*3/uL (ref 0.7–4.0)
MCH: 35.4 pg — AB (ref 26.0–34.0)
MCHC: 34.5 g/dL (ref 30.0–36.0)
MCV: 102.5 fL — AB (ref 80.0–100.0)
MONOS PCT: 7 %
Monocytes Absolute: 0.3 10*3/uL (ref 0.1–1.0)
NEUTROS ABS: 1.9 10*3/uL (ref 1.7–7.7)
NEUTROS PCT: 48 %
PLATELETS: 261 10*3/uL (ref 150–400)
RBC: 3.56 MIL/uL — AB (ref 3.87–5.11)
RDW: 14.6 % (ref 11.5–15.5)
WBC: 3.8 10*3/uL — AB (ref 4.0–10.5)
nRBC: 0 % (ref 0.0–0.2)

## 2018-01-25 MED ORDER — FULVESTRANT 250 MG/5ML IM SOLN
500.0000 mg | Freq: Once | INTRAMUSCULAR | Status: AC
  Administered 2018-01-25: 500 mg via INTRAMUSCULAR

## 2018-01-25 MED ORDER — FULVESTRANT 250 MG/5ML IM SOLN
INTRAMUSCULAR | Status: AC
  Filled 2018-01-25: qty 5

## 2018-01-25 NOTE — Patient Instructions (Signed)

## 2018-01-28 ENCOUNTER — Other Ambulatory Visit: Payer: Self-pay | Admitting: Oncology

## 2018-02-01 ENCOUNTER — Ambulatory Visit (INDEPENDENT_AMBULATORY_CARE_PROVIDER_SITE_OTHER): Payer: Self-pay | Admitting: Psychiatry

## 2018-02-01 DIAGNOSIS — F4323 Adjustment disorder with mixed anxiety and depressed mood: Secondary | ICD-10-CM

## 2018-02-14 ENCOUNTER — Other Ambulatory Visit: Payer: Self-pay | Admitting: *Deleted

## 2018-02-14 DIAGNOSIS — C50411 Malignant neoplasm of upper-outer quadrant of right female breast: Secondary | ICD-10-CM

## 2018-02-14 DIAGNOSIS — C7800 Secondary malignant neoplasm of unspecified lung: Secondary | ICD-10-CM

## 2018-02-14 DIAGNOSIS — Z170 Estrogen receptor positive status [ER+]: Principal | ICD-10-CM

## 2018-02-14 DIAGNOSIS — C50919 Malignant neoplasm of unspecified site of unspecified female breast: Secondary | ICD-10-CM

## 2018-02-15 ENCOUNTER — Telehealth: Payer: Self-pay | Admitting: Oncology

## 2018-02-15 ENCOUNTER — Other Ambulatory Visit: Payer: Self-pay | Admitting: Oncology

## 2018-02-15 NOTE — Telephone Encounter (Signed)
Faxed records to Dr. Laurelyn Sickle office. The new pt scheduler will call pt with appt.

## 2018-02-21 NOTE — Progress Notes (Signed)
No show

## 2018-02-22 ENCOUNTER — Inpatient Hospital Stay: Payer: BLUE CROSS/BLUE SHIELD | Attending: Oncology

## 2018-02-22 ENCOUNTER — Inpatient Hospital Stay: Payer: BLUE CROSS/BLUE SHIELD

## 2018-02-22 ENCOUNTER — Inpatient Hospital Stay: Payer: BLUE CROSS/BLUE SHIELD | Admitting: Oncology

## 2018-02-22 ENCOUNTER — Ambulatory Visit (INDEPENDENT_AMBULATORY_CARE_PROVIDER_SITE_OTHER): Payer: Self-pay | Admitting: Psychiatry

## 2018-02-22 DIAGNOSIS — F4323 Adjustment disorder with mixed anxiety and depressed mood: Secondary | ICD-10-CM

## 2018-02-22 MED ORDER — FULVESTRANT 250 MG/5ML IM SOLN
INTRAMUSCULAR | Status: AC
  Filled 2018-02-22: qty 10

## 2018-02-23 ENCOUNTER — Encounter: Payer: Self-pay | Admitting: Oncology

## 2018-03-09 ENCOUNTER — Ambulatory Visit (INDEPENDENT_AMBULATORY_CARE_PROVIDER_SITE_OTHER): Payer: Self-pay | Admitting: Psychiatry

## 2018-03-09 DIAGNOSIS — F4323 Adjustment disorder with mixed anxiety and depressed mood: Secondary | ICD-10-CM

## 2018-03-19 ENCOUNTER — Other Ambulatory Visit: Payer: Self-pay | Admitting: Oncology

## 2018-03-19 DIAGNOSIS — C50411 Malignant neoplasm of upper-outer quadrant of right female breast: Secondary | ICD-10-CM

## 2018-03-20 ENCOUNTER — Other Ambulatory Visit: Payer: Self-pay | Admitting: Oncology

## 2018-03-23 ENCOUNTER — Ambulatory Visit (INDEPENDENT_AMBULATORY_CARE_PROVIDER_SITE_OTHER): Payer: Self-pay | Admitting: Psychiatry

## 2018-03-23 DIAGNOSIS — F4323 Adjustment disorder with mixed anxiety and depressed mood: Secondary | ICD-10-CM

## 2018-04-06 ENCOUNTER — Ambulatory Visit (INDEPENDENT_AMBULATORY_CARE_PROVIDER_SITE_OTHER): Payer: Self-pay | Admitting: Psychiatry

## 2018-04-06 DIAGNOSIS — F4323 Adjustment disorder with mixed anxiety and depressed mood: Secondary | ICD-10-CM

## 2018-04-18 ENCOUNTER — Other Ambulatory Visit: Payer: Self-pay | Admitting: Oncology

## 2018-04-18 DIAGNOSIS — C50411 Malignant neoplasm of upper-outer quadrant of right female breast: Secondary | ICD-10-CM

## 2018-04-20 ENCOUNTER — Ambulatory Visit: Payer: Self-pay | Admitting: Psychiatry

## 2018-04-22 ENCOUNTER — Other Ambulatory Visit: Payer: Self-pay | Admitting: Oncology

## 2018-05-03 ENCOUNTER — Ambulatory Visit: Payer: Self-pay | Admitting: Psychiatry

## 2018-05-04 ENCOUNTER — Ambulatory Visit: Payer: Self-pay | Admitting: Psychiatry

## 2018-05-18 ENCOUNTER — Ambulatory Visit: Payer: Self-pay | Admitting: Psychiatry

## 2018-05-23 ENCOUNTER — Ambulatory Visit: Payer: BLUE CROSS/BLUE SHIELD | Admitting: Obstetrics and Gynecology

## 2018-05-24 ENCOUNTER — Other Ambulatory Visit: Payer: Self-pay | Admitting: Oncology

## 2018-05-24 DIAGNOSIS — C50411 Malignant neoplasm of upper-outer quadrant of right female breast: Secondary | ICD-10-CM

## 2018-06-01 ENCOUNTER — Ambulatory Visit: Payer: Self-pay | Admitting: Psychiatry

## 2018-06-15 ENCOUNTER — Ambulatory Visit: Payer: Self-pay | Admitting: Psychiatry

## 2018-06-19 ENCOUNTER — Other Ambulatory Visit: Payer: Self-pay | Admitting: Oncology

## 2018-06-23 ENCOUNTER — Telehealth: Payer: Self-pay | Admitting: Oncology

## 2018-06-23 NOTE — Telephone Encounter (Signed)
Scheduled appt per sch msg. Called patient, no answer. Mailed printout.

## 2018-06-29 ENCOUNTER — Ambulatory Visit: Payer: Self-pay | Admitting: Psychiatry

## 2018-07-13 ENCOUNTER — Ambulatory Visit: Payer: Self-pay | Admitting: Psychiatry

## 2018-07-16 ENCOUNTER — Other Ambulatory Visit: Payer: Self-pay | Admitting: Oncology

## 2018-07-27 ENCOUNTER — Ambulatory Visit: Payer: Self-pay | Admitting: Psychiatry

## 2018-07-28 ENCOUNTER — Inpatient Hospital Stay: Payer: Self-pay | Admitting: Oncology

## 2018-08-10 ENCOUNTER — Ambulatory Visit: Payer: Self-pay | Admitting: Psychiatry

## 2018-08-17 ENCOUNTER — Ambulatory Visit (INDEPENDENT_AMBULATORY_CARE_PROVIDER_SITE_OTHER): Payer: Self-pay | Admitting: Psychiatry

## 2018-08-17 DIAGNOSIS — F4323 Adjustment disorder with mixed anxiety and depressed mood: Secondary | ICD-10-CM

## 2018-08-24 ENCOUNTER — Ambulatory Visit: Payer: Self-pay | Admitting: Psychiatry

## 2018-08-30 ENCOUNTER — Other Ambulatory Visit: Payer: Self-pay | Admitting: Oncology

## 2018-08-31 ENCOUNTER — Ambulatory Visit: Payer: BLUE CROSS/BLUE SHIELD | Admitting: Psychiatry

## 2018-09-01 ENCOUNTER — Ambulatory Visit (INDEPENDENT_AMBULATORY_CARE_PROVIDER_SITE_OTHER): Payer: BLUE CROSS/BLUE SHIELD | Admitting: Psychiatry

## 2018-09-01 ENCOUNTER — Ambulatory Visit: Payer: Self-pay | Admitting: Psychiatry

## 2018-09-01 DIAGNOSIS — F4323 Adjustment disorder with mixed anxiety and depressed mood: Secondary | ICD-10-CM

## 2018-09-03 ENCOUNTER — Other Ambulatory Visit: Payer: Self-pay | Admitting: Oncology

## 2018-09-07 ENCOUNTER — Ambulatory Visit: Payer: Self-pay | Admitting: Psychiatry

## 2018-09-13 ENCOUNTER — Ambulatory Visit (INDEPENDENT_AMBULATORY_CARE_PROVIDER_SITE_OTHER): Payer: Self-pay | Admitting: Psychiatry

## 2018-09-13 DIAGNOSIS — F4323 Adjustment disorder with mixed anxiety and depressed mood: Secondary | ICD-10-CM

## 2018-09-14 ENCOUNTER — Ambulatory Visit: Payer: BLUE CROSS/BLUE SHIELD | Admitting: Psychiatry

## 2018-09-15 ENCOUNTER — Other Ambulatory Visit: Payer: Self-pay | Admitting: Oncology

## 2018-09-21 ENCOUNTER — Ambulatory Visit: Payer: Self-pay | Admitting: Psychiatry

## 2018-09-27 ENCOUNTER — Ambulatory Visit (INDEPENDENT_AMBULATORY_CARE_PROVIDER_SITE_OTHER): Payer: Self-pay | Admitting: Psychiatry

## 2018-09-27 DIAGNOSIS — F4323 Adjustment disorder with mixed anxiety and depressed mood: Secondary | ICD-10-CM

## 2018-09-28 ENCOUNTER — Ambulatory Visit: Payer: BLUE CROSS/BLUE SHIELD | Admitting: Psychiatry

## 2018-10-05 ENCOUNTER — Ambulatory Visit: Payer: Self-pay | Admitting: Psychiatry

## 2018-10-11 ENCOUNTER — Ambulatory Visit (INDEPENDENT_AMBULATORY_CARE_PROVIDER_SITE_OTHER): Payer: Self-pay | Admitting: Psychiatry

## 2018-10-11 DIAGNOSIS — F4323 Adjustment disorder with mixed anxiety and depressed mood: Secondary | ICD-10-CM

## 2018-10-19 ENCOUNTER — Ambulatory Visit: Payer: Self-pay | Admitting: Psychiatry

## 2018-10-25 ENCOUNTER — Ambulatory Visit (INDEPENDENT_AMBULATORY_CARE_PROVIDER_SITE_OTHER): Payer: BLUE CROSS/BLUE SHIELD | Admitting: Psychiatry

## 2018-10-25 DIAGNOSIS — F4323 Adjustment disorder with mixed anxiety and depressed mood: Secondary | ICD-10-CM

## 2018-11-02 ENCOUNTER — Ambulatory Visit: Payer: Self-pay | Admitting: Psychiatry

## 2018-11-08 ENCOUNTER — Ambulatory Visit (INDEPENDENT_AMBULATORY_CARE_PROVIDER_SITE_OTHER): Payer: Self-pay | Admitting: Psychiatry

## 2018-11-08 DIAGNOSIS — F4323 Adjustment disorder with mixed anxiety and depressed mood: Secondary | ICD-10-CM

## 2018-11-16 ENCOUNTER — Ambulatory Visit: Payer: Self-pay | Admitting: Psychiatry

## 2018-11-22 ENCOUNTER — Ambulatory Visit (INDEPENDENT_AMBULATORY_CARE_PROVIDER_SITE_OTHER): Payer: Self-pay | Admitting: Psychiatry

## 2018-11-22 DIAGNOSIS — F4323 Adjustment disorder with mixed anxiety and depressed mood: Secondary | ICD-10-CM

## 2018-11-30 ENCOUNTER — Ambulatory Visit: Payer: Self-pay | Admitting: Psychiatry

## 2018-12-06 ENCOUNTER — Ambulatory Visit (INDEPENDENT_AMBULATORY_CARE_PROVIDER_SITE_OTHER): Payer: Self-pay | Admitting: Psychiatry

## 2018-12-06 DIAGNOSIS — F4323 Adjustment disorder with mixed anxiety and depressed mood: Secondary | ICD-10-CM

## 2018-12-14 ENCOUNTER — Ambulatory Visit: Payer: Self-pay | Admitting: Psychiatry

## 2018-12-20 ENCOUNTER — Ambulatory Visit (INDEPENDENT_AMBULATORY_CARE_PROVIDER_SITE_OTHER): Payer: Self-pay | Admitting: Psychiatry

## 2018-12-20 DIAGNOSIS — F4323 Adjustment disorder with mixed anxiety and depressed mood: Secondary | ICD-10-CM

## 2018-12-28 ENCOUNTER — Ambulatory Visit: Payer: Self-pay | Admitting: Psychiatry

## 2019-01-03 ENCOUNTER — Ambulatory Visit (INDEPENDENT_AMBULATORY_CARE_PROVIDER_SITE_OTHER): Payer: Self-pay | Admitting: Psychiatry

## 2019-01-03 DIAGNOSIS — F4323 Adjustment disorder with mixed anxiety and depressed mood: Secondary | ICD-10-CM

## 2019-01-11 ENCOUNTER — Ambulatory Visit: Payer: Self-pay | Admitting: Psychiatry

## 2019-01-17 ENCOUNTER — Ambulatory Visit (INDEPENDENT_AMBULATORY_CARE_PROVIDER_SITE_OTHER): Payer: Self-pay | Admitting: Psychiatry

## 2019-01-17 DIAGNOSIS — F4323 Adjustment disorder with mixed anxiety and depressed mood: Secondary | ICD-10-CM

## 2019-01-25 ENCOUNTER — Ambulatory Visit: Payer: Self-pay | Admitting: Psychiatry

## 2019-01-31 ENCOUNTER — Ambulatory Visit (INDEPENDENT_AMBULATORY_CARE_PROVIDER_SITE_OTHER): Payer: Self-pay | Admitting: Psychiatry

## 2019-01-31 DIAGNOSIS — F4323 Adjustment disorder with mixed anxiety and depressed mood: Secondary | ICD-10-CM

## 2019-02-14 ENCOUNTER — Ambulatory Visit (INDEPENDENT_AMBULATORY_CARE_PROVIDER_SITE_OTHER): Payer: BLUE CROSS/BLUE SHIELD | Admitting: Psychiatry

## 2019-02-14 DIAGNOSIS — F4323 Adjustment disorder with mixed anxiety and depressed mood: Secondary | ICD-10-CM

## 2019-02-28 ENCOUNTER — Ambulatory Visit (INDEPENDENT_AMBULATORY_CARE_PROVIDER_SITE_OTHER): Payer: BLUE CROSS/BLUE SHIELD | Admitting: Psychiatry

## 2019-02-28 DIAGNOSIS — F4323 Adjustment disorder with mixed anxiety and depressed mood: Secondary | ICD-10-CM

## 2019-03-14 ENCOUNTER — Ambulatory Visit (INDEPENDENT_AMBULATORY_CARE_PROVIDER_SITE_OTHER): Payer: BLUE CROSS/BLUE SHIELD | Admitting: Psychiatry

## 2019-03-14 DIAGNOSIS — F4323 Adjustment disorder with mixed anxiety and depressed mood: Secondary | ICD-10-CM

## 2019-03-22 ENCOUNTER — Encounter: Payer: Self-pay | Admitting: Genetic Counselor

## 2019-03-28 ENCOUNTER — Ambulatory Visit (INDEPENDENT_AMBULATORY_CARE_PROVIDER_SITE_OTHER): Payer: BLUE CROSS/BLUE SHIELD | Admitting: Psychiatry

## 2019-03-28 DIAGNOSIS — F4323 Adjustment disorder with mixed anxiety and depressed mood: Secondary | ICD-10-CM

## 2019-04-11 ENCOUNTER — Ambulatory Visit: Payer: BLUE CROSS/BLUE SHIELD | Admitting: Psychiatry

## 2019-04-18 ENCOUNTER — Ambulatory Visit (INDEPENDENT_AMBULATORY_CARE_PROVIDER_SITE_OTHER): Payer: BLUE CROSS/BLUE SHIELD | Admitting: Psychiatry

## 2019-04-18 DIAGNOSIS — F4323 Adjustment disorder with mixed anxiety and depressed mood: Secondary | ICD-10-CM

## 2019-04-25 ENCOUNTER — Ambulatory Visit: Payer: BLUE CROSS/BLUE SHIELD | Admitting: Psychiatry

## 2019-05-09 ENCOUNTER — Ambulatory Visit: Payer: BLUE CROSS/BLUE SHIELD | Admitting: Psychiatry

## 2019-05-23 ENCOUNTER — Ambulatory Visit: Payer: BLUE CROSS/BLUE SHIELD | Admitting: Psychiatry

## 2019-06-06 ENCOUNTER — Ambulatory Visit: Payer: BLUE CROSS/BLUE SHIELD | Admitting: Psychiatry

## 2019-06-20 ENCOUNTER — Ambulatory Visit: Payer: BLUE CROSS/BLUE SHIELD | Admitting: Psychiatry

## 2019-07-04 ENCOUNTER — Ambulatory Visit: Payer: BLUE CROSS/BLUE SHIELD | Admitting: Psychiatry

## 2019-07-18 ENCOUNTER — Ambulatory Visit: Payer: BLUE CROSS/BLUE SHIELD | Admitting: Psychiatry

## 2019-08-01 ENCOUNTER — Ambulatory Visit: Payer: BLUE CROSS/BLUE SHIELD | Admitting: Psychiatry

## 2019-08-15 ENCOUNTER — Ambulatory Visit: Payer: BLUE CROSS/BLUE SHIELD | Admitting: Psychiatry

## 2019-08-29 ENCOUNTER — Ambulatory Visit: Payer: BLUE CROSS/BLUE SHIELD | Admitting: Psychiatry

## 2019-09-12 ENCOUNTER — Ambulatory Visit: Payer: BLUE CROSS/BLUE SHIELD | Admitting: Psychiatry

## 2020-05-09 ENCOUNTER — Encounter: Payer: Self-pay | Admitting: Internal Medicine

## 2023-03-17 ENCOUNTER — Encounter: Payer: Self-pay | Admitting: Genetic Counselor
